# Patient Record
Sex: Female | Born: 1940 | Race: White | Hispanic: No | Smoking: Never smoker
Health system: Southern US, Community
[De-identification: ages and names within clinical notes are randomized; demographics above are authoritative.]

## PROBLEM LIST (undated history)

## (undated) DIAGNOSIS — I2510 Atherosclerotic heart disease of native coronary artery without angina pectoris: Secondary | ICD-10-CM

## (undated) DIAGNOSIS — I255 Ischemic cardiomyopathy: Secondary | ICD-10-CM

## (undated) DIAGNOSIS — T8859XA Other complications of anesthesia, initial encounter: Secondary | ICD-10-CM

## (undated) DIAGNOSIS — I639 Cerebral infarction, unspecified: Secondary | ICD-10-CM

## (undated) DIAGNOSIS — I82409 Acute embolism and thrombosis of unspecified deep veins of unspecified lower extremity: Secondary | ICD-10-CM

## (undated) DIAGNOSIS — E7800 Pure hypercholesterolemia, unspecified: Secondary | ICD-10-CM

## (undated) DIAGNOSIS — T4145XA Adverse effect of unspecified anesthetic, initial encounter: Secondary | ICD-10-CM

## (undated) DIAGNOSIS — I10 Essential (primary) hypertension: Secondary | ICD-10-CM

## (undated) DIAGNOSIS — H409 Unspecified glaucoma: Secondary | ICD-10-CM

## (undated) DIAGNOSIS — R7303 Prediabetes: Secondary | ICD-10-CM

## (undated) DIAGNOSIS — F419 Anxiety disorder, unspecified: Secondary | ICD-10-CM

## (undated) DIAGNOSIS — H9390 Unspecified disorder of ear, unspecified ear: Secondary | ICD-10-CM

## (undated) DIAGNOSIS — Z8489 Family history of other specified conditions: Secondary | ICD-10-CM

## (undated) DIAGNOSIS — H5440 Blindness, one eye, unspecified eye: Secondary | ICD-10-CM

## (undated) HISTORY — PX: BREAST SURGERY: SHX581

## (undated) HISTORY — DX: Acute embolism and thrombosis of unspecified deep veins of unspecified lower extremity: I82.409

## (undated) HISTORY — PX: EYE SURGERY: SHX253

## (undated) HISTORY — DX: Pure hypercholesterolemia, unspecified: E78.00

## (undated) HISTORY — DX: Ischemic cardiomyopathy: I25.5

## (undated) HISTORY — DX: Cerebral infarction, unspecified: I63.9

## (undated) HISTORY — DX: Atherosclerotic heart disease of native coronary artery without angina pectoris: I25.10

## (undated) HISTORY — PX: CHOLECYSTECTOMY: SHX55

---

## 1998-08-14 ENCOUNTER — Emergency Department (HOSPITAL_COMMUNITY): Admission: EM | Admit: 1998-08-14 | Discharge: 1998-08-14 | Payer: Self-pay | Admitting: Emergency Medicine

## 2000-08-21 ENCOUNTER — Encounter: Payer: Self-pay | Admitting: *Deleted

## 2000-08-21 ENCOUNTER — Ambulatory Visit (HOSPITAL_COMMUNITY): Admission: RE | Admit: 2000-08-21 | Discharge: 2000-08-21 | Payer: Self-pay | Admitting: *Deleted

## 2004-08-17 ENCOUNTER — Ambulatory Visit (HOSPITAL_COMMUNITY): Admission: RE | Admit: 2004-08-17 | Discharge: 2004-08-17 | Payer: Self-pay | Admitting: Family Medicine

## 2007-11-14 ENCOUNTER — Emergency Department (HOSPITAL_COMMUNITY): Admission: EM | Admit: 2007-11-14 | Discharge: 2007-11-14 | Payer: Self-pay | Admitting: Emergency Medicine

## 2010-04-17 ENCOUNTER — Encounter: Payer: Self-pay | Admitting: Family Medicine

## 2012-02-28 ENCOUNTER — Other Ambulatory Visit (HOSPITAL_COMMUNITY): Payer: Self-pay | Admitting: Family Medicine

## 2012-02-28 DIAGNOSIS — IMO0001 Reserved for inherently not codable concepts without codable children: Secondary | ICD-10-CM

## 2012-02-28 DIAGNOSIS — M79606 Pain in leg, unspecified: Secondary | ICD-10-CM

## 2012-02-29 ENCOUNTER — Ambulatory Visit (HOSPITAL_COMMUNITY)
Admission: RE | Admit: 2012-02-29 | Discharge: 2012-02-29 | Disposition: A | Discharge status: Home / Self Care | Payer: Medicare Other | Source: Ambulatory Visit | Attending: Family Medicine | Admitting: Family Medicine

## 2012-02-29 DIAGNOSIS — M79609 Pain in unspecified limb: Secondary | ICD-10-CM | POA: Insufficient documentation

## 2012-02-29 DIAGNOSIS — IMO0001 Reserved for inherently not codable concepts without codable children: Secondary | ICD-10-CM | POA: Insufficient documentation

## 2014-03-30 DIAGNOSIS — Z6838 Body mass index (BMI) 38.0-38.9, adult: Secondary | ICD-10-CM | POA: Diagnosis not present

## 2014-03-30 DIAGNOSIS — E782 Mixed hyperlipidemia: Secondary | ICD-10-CM | POA: Diagnosis not present

## 2014-03-30 DIAGNOSIS — I10 Essential (primary) hypertension: Secondary | ICD-10-CM | POA: Diagnosis not present

## 2014-05-26 DIAGNOSIS — H4011X1 Primary open-angle glaucoma, mild stage: Secondary | ICD-10-CM | POA: Diagnosis not present

## 2014-09-21 DIAGNOSIS — B029 Zoster without complications: Secondary | ICD-10-CM | POA: Diagnosis not present

## 2014-09-21 DIAGNOSIS — Z6838 Body mass index (BMI) 38.0-38.9, adult: Secondary | ICD-10-CM | POA: Diagnosis not present

## 2014-09-21 DIAGNOSIS — Z1389 Encounter for screening for other disorder: Secondary | ICD-10-CM | POA: Diagnosis not present

## 2014-09-21 DIAGNOSIS — I10 Essential (primary) hypertension: Secondary | ICD-10-CM | POA: Diagnosis not present

## 2014-11-17 DIAGNOSIS — H4011X1 Primary open-angle glaucoma, mild stage: Secondary | ICD-10-CM | POA: Diagnosis not present

## 2015-03-16 DIAGNOSIS — H401131 Primary open-angle glaucoma, bilateral, mild stage: Secondary | ICD-10-CM | POA: Diagnosis not present

## 2015-07-06 DIAGNOSIS — H179 Unspecified corneal scar and opacity: Secondary | ICD-10-CM | POA: Diagnosis not present

## 2015-07-06 DIAGNOSIS — I10 Essential (primary) hypertension: Secondary | ICD-10-CM | POA: Diagnosis not present

## 2015-07-06 DIAGNOSIS — Z6839 Body mass index (BMI) 39.0-39.9, adult: Secondary | ICD-10-CM | POA: Diagnosis not present

## 2015-07-06 DIAGNOSIS — E669 Obesity, unspecified: Secondary | ICD-10-CM | POA: Diagnosis not present

## 2015-07-06 DIAGNOSIS — K219 Gastro-esophageal reflux disease without esophagitis: Secondary | ICD-10-CM | POA: Diagnosis not present

## 2015-07-06 DIAGNOSIS — Z1389 Encounter for screening for other disorder: Secondary | ICD-10-CM | POA: Diagnosis not present

## 2015-07-06 DIAGNOSIS — E782 Mixed hyperlipidemia: Secondary | ICD-10-CM | POA: Diagnosis not present

## 2015-07-20 DIAGNOSIS — H401131 Primary open-angle glaucoma, bilateral, mild stage: Secondary | ICD-10-CM | POA: Diagnosis not present

## 2015-10-19 DIAGNOSIS — H1712 Central corneal opacity, left eye: Secondary | ICD-10-CM | POA: Diagnosis not present

## 2015-10-19 DIAGNOSIS — Z961 Presence of intraocular lens: Secondary | ICD-10-CM | POA: Diagnosis not present

## 2015-10-19 DIAGNOSIS — H2511 Age-related nuclear cataract, right eye: Secondary | ICD-10-CM | POA: Diagnosis not present

## 2015-10-19 DIAGNOSIS — H401131 Primary open-angle glaucoma, bilateral, mild stage: Secondary | ICD-10-CM | POA: Diagnosis not present

## 2015-10-20 DIAGNOSIS — Z6841 Body Mass Index (BMI) 40.0 and over, adult: Secondary | ICD-10-CM | POA: Diagnosis not present

## 2015-10-20 DIAGNOSIS — Z0000 Encounter for general adult medical examination without abnormal findings: Secondary | ICD-10-CM | POA: Diagnosis not present

## 2015-10-25 DIAGNOSIS — Z0100 Encounter for examination of eyes and vision without abnormal findings: Secondary | ICD-10-CM | POA: Diagnosis not present

## 2016-03-16 DIAGNOSIS — J019 Acute sinusitis, unspecified: Secondary | ICD-10-CM | POA: Diagnosis not present

## 2016-03-16 DIAGNOSIS — E782 Mixed hyperlipidemia: Secondary | ICD-10-CM | POA: Diagnosis not present

## 2016-03-16 DIAGNOSIS — J343 Hypertrophy of nasal turbinates: Secondary | ICD-10-CM | POA: Diagnosis not present

## 2016-03-16 DIAGNOSIS — K219 Gastro-esophageal reflux disease without esophagitis: Secondary | ICD-10-CM | POA: Diagnosis not present

## 2016-03-16 DIAGNOSIS — F419 Anxiety disorder, unspecified: Secondary | ICD-10-CM | POA: Diagnosis not present

## 2016-03-16 DIAGNOSIS — I10 Essential (primary) hypertension: Secondary | ICD-10-CM | POA: Diagnosis not present

## 2016-03-16 DIAGNOSIS — R070 Pain in throat: Secondary | ICD-10-CM | POA: Diagnosis not present

## 2016-03-16 DIAGNOSIS — Z681 Body mass index (BMI) 19 or less, adult: Secondary | ICD-10-CM | POA: Diagnosis not present

## 2016-03-16 DIAGNOSIS — J069 Acute upper respiratory infection, unspecified: Secondary | ICD-10-CM | POA: Diagnosis not present

## 2016-04-07 ENCOUNTER — Emergency Department (HOSPITAL_COMMUNITY)
Admission: EM | Admit: 2016-04-07 | Discharge: 2016-04-08 | Disposition: A | Discharge status: Home / Self Care | Payer: Medicare HMO | Attending: Emergency Medicine | Admitting: Emergency Medicine

## 2016-04-07 ENCOUNTER — Emergency Department (HOSPITAL_COMMUNITY): Payer: Medicare HMO

## 2016-04-07 ENCOUNTER — Encounter (HOSPITAL_COMMUNITY): Payer: Self-pay | Admitting: Emergency Medicine

## 2016-04-07 DIAGNOSIS — Z79899 Other long term (current) drug therapy: Secondary | ICD-10-CM | POA: Diagnosis not present

## 2016-04-07 DIAGNOSIS — F419 Anxiety disorder, unspecified: Secondary | ICD-10-CM | POA: Insufficient documentation

## 2016-04-07 DIAGNOSIS — R42 Dizziness and giddiness: Secondary | ICD-10-CM | POA: Diagnosis not present

## 2016-04-07 DIAGNOSIS — R51 Headache: Secondary | ICD-10-CM | POA: Diagnosis not present

## 2016-04-07 DIAGNOSIS — I10 Essential (primary) hypertension: Secondary | ICD-10-CM | POA: Diagnosis not present

## 2016-04-07 HISTORY — DX: Unspecified glaucoma: H40.9

## 2016-04-07 HISTORY — DX: Essential (primary) hypertension: I10

## 2016-04-07 HISTORY — DX: Unspecified disorder of ear, unspecified ear: H93.90

## 2016-04-07 HISTORY — DX: Anxiety disorder, unspecified: F41.9

## 2016-04-07 MED ORDER — MECLIZINE HCL 12.5 MG PO TABS: 25.0000 mg | ORAL_TABLET | Freq: Once | ORAL | Status: DC

## 2016-04-07 MED ORDER — ONDANSETRON 8 MG PO TBDP
8.0000 mg | ORAL_TABLET | Freq: Once | ORAL | Status: AC
  Administered 2016-04-07: 8 mg via ORAL
  Filled 2016-04-07: qty 1

## 2016-04-07 NOTE — ED Triage Notes (Signed)
Pt states she is "shaky" on the inside. Family states pt has anxiety and pt states she took a a nerve pill prior to coming up here and that she has a headache.

## 2016-04-07 NOTE — ED Provider Notes (Signed)
[facility]EM[facility]Y DEPT Provider Note   CSN: [id number] Arrival date & time: 04/07/16  1951  By signing my name below, I, Talbert NanPaul Grant, attest that this documentation has been prepared under the direction and in the presence of Devoria AlbeIva Magin Balbi, MD. Electronically Signed: Talbert NanPaul Grant, Scribe. 04/07/16. 11:49 PM.  Time seen 23:10 PM   History   Chief Complaint Chief Complaint  Patient presents with  . Anxiety    HPI Sheryl Carter is a 76 y.o. female with h/o ear problems who presents to the Emergency Department complaining of intermittent, moderate headache that radiates from the front of her forehead to her right ear and into the back of the right side of her head and is described as pressure that started at 7pm tonight. She denies that the headache feels like throbbing. She feels like she is falling when walking and when she moves her head. Pt has associated nausea. She has had this falling feeling before, but never to this extent. She took baby aspirin with limited relief twice today at 6:30 pm and 9 pm.  Before Thanksgiving approximately 6 weeks ago, she reports vomiting, nausea, coughing. She also reports that she woke up screaming because she thought she was falling out of her bed. She has had the feeling of falling and movement since that time. Was last at the doctor in the beginning of December, reported to have earwax and was given an abx prescription. She cannot lay flat in bed, she has to lay on her side because it makes her symptoms worse as does standing upright. Pt lives alone. She is a nonsmoker and she does not drink EtOH. She denies neck pain.  She has had vertigo in the past.  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 She has had tubes in her ears in the 1980's.    The history is provided by the patient and a relative. No language interpreter was used.    PCP Dr Sherwood GamblerFusco  Past Medical History:  Diagnosis Date  . Anxiety     . Ear problems   . Glaucoma   . Hypertension     There are no active problems to display for this patient.   Past Surgical History:  Procedure Laterality Date  . CHOLECYSTECTOMY    . EYE SURGERY      OB History    No data available       Home Medications    Prior to Admission medications   Medication Sig Start Date End Date Taking? Authorizing Provider  ALPRAZolam (XANAX) 0.25 MG tablet Take 0.25 mg by mouth 3 (three) times daily.   Yes Historical Provider, MD  aspirin EC 81 MG tablet Take 81 mg by mouth daily.   Yes Historical Provider, MD  esomeprazole (NEXIUM 24HR) 20 MG capsule Take 20 mg by mouth daily at 12 noon.   Yes Historical Provider, MD  lisinopril-hydrochlorothiazide (PRINZIDE,ZESTORETIC) 10-12.5 MG tablet Take 1 tablet by mouth daily. 02/23/16  Yes Historical Provider, MD  timolol (BETIMOL) 0.5 % ophthalmic solution Place 1 drop into both eyes 2 (two) times daily.   Yes Historical Provider, MD  vitamin B-12 (CYANOCOBALAMIN) 1000 MCG tablet Take 1,000 mcg by mouth daily.   Yes Historical Provider, MD  ondansetron (ZOFRAN ODT) 4 MG disintegrating tablet Take 1 tablet (4 mg total) by mouth every 8 (eight) hours as needed for nausea or vomiting. 04/08/16   Devoria AlbeIva Yamilette Garretson, MD  Family History No family history on file.  Social History Social History  Substance Use Topics  . Smoking status: Never Smoker  . Smokeless tobacco: Never Used  . Alcohol use No  lives alone Uses a cane   Allergies   Erythromycin; Other; and Prednisone   Review of Systems Review of Systems  Gastrointestinal: Positive for nausea.  Neurological: Positive for headaches.       She feels like she is falling when she moves her head.   All other systems reviewed and are negative.    Physical Exam Updated Vital Signs BP 154/84 (BP Location: Left Arm)   Pulse 73   Temp 98.5 F (36.9 C) (Oral)   Resp 18   Ht 4\' 11"  (1.499 m)   Wt 190 lb (86.2 kg)   SpO2 99%   BMI 38.38 kg/m    Vital signs normal except for hypertension   Physical Exam  Constitutional: She is oriented to person, place, and time. She appears well-developed and well-nourished.  Non-toxic appearance. She does not appear ill. No distress.  HENT:  Head: Normocephalic and atraumatic.  Right Ear: External ear normal.  Left Ear: External ear normal.  Nose: Nose normal. No mucosal edema or rhinorrhea.  Mouth/Throat: Oropharynx is clear and moist and mucous membranes are normal. No dental abscesses or uvula swelling.  oropharynx edentulous.  Eyes:  Left cornea is opaque. Right pupil is reactive to light.   Neck: Normal range of motion and full passive range of motion without pain. Neck supple.  Cardiovascular: Normal rate, regular rhythm and normal heart sounds.  Exam reveals no gallop and no friction rub.   No murmur heard. Pulmonary/Chest: Effort normal and breath sounds normal. No respiratory distress. She has no wheezes. She has no rhonchi. She has no rales. She exhibits no tenderness and no crepitus.  Abdominal: Soft. Normal appearance and bowel sounds are normal. She exhibits no distension. There is no tenderness. There is no rebound and no guarding.  Musculoskeletal: Normal range of motion. She exhibits no edema or tenderness.  Moves all extremities well.  Neurological: She is alert and oriented to person, place, and time. She has normal strength. No cranial nerve deficit.  Grips equal bilaterally, no pronator drift, no weakness in her extremities  Skin: Skin is warm, dry and intact. No rash noted. No erythema. No pallor.  Psychiatric: She has a normal mood and affect. Her speech is normal and behavior is normal. Her mood appears not anxious.  Nursing note and vitals reviewed.    ED Treatments / Results   DIAGNOSTIC STUDIES: Oxygen Saturation is 99% on room air, normal by my interpretation.      Labs (all labs ordered are listed, but only abnormal results are displayed) Results for  orders placed or performed during the hospital encounter of 04/07/16  Comprehensive metabolic panel  Result Value Ref Range   Sodium 140 135 - 145 mmol/L   Potassium 3.5 3.5 - 5.1 mmol/L   Chloride 104 101 - 111 mmol/L   CO2 28 22 - 32 mmol/L   Glucose, Bld 120 (H) 65 - 99 mg/dL   BUN 11 6 - 20 mg/dL   Creatinine, Ser 1.61 0.44 - 1.00 mg/dL   Calcium 9.4 8.9 - 09.6 mg/dL   Total Protein 6.9 6.5 - 8.1 g/dL   Albumin 3.8 3.5 - 5.0 g/dL   AST 24 15 - 41 U/L   ALT 19 14 - 54 U/L   Alkaline Phosphatase 66 38 -  126 U/L   Total Bilirubin 0.6 0.3 - 1.2 mg/dL   GFR calc non Af Amer >60 >60 mL/min   GFR calc Af Amer >60 >60 mL/min   Anion gap 8 5 - 15  CBC with Differential  Result Value Ref Range   WBC 7.6 4.0 - 10.5 K/uL   RBC 4.58 3.87 - 5.11 MIL/uL   Hemoglobin 14.3 12.0 - 15.0 g/dL   HCT 16.1 09.6 - 04.5 %   MCV 91.0 78.0 - 100.0 fL   MCH 31.2 26.0 - 34.0 pg   MCHC 34.3 30.0 - 36.0 g/dL   RDW 40.9 81.1 - 91.4 %   Platelets 257 150 - 400 K/uL   Neutrophils Relative % 54 %   Neutro Abs 4.1 1.7 - 7.7 K/uL   Lymphocytes Relative 40 %   Lymphs Abs 3.0 0.7 - 4.0 K/uL   Monocytes Relative 4 %   Monocytes Absolute 0.3 0.1 - 1.0 K/uL   Eosinophils Relative 2 %   Eosinophils Absolute 0.1 0.0 - 0.7 K/uL   Basophils Relative 0 %   Basophils Absolute 0.0 0.0 - 0.1 K/uL  Urinalysis, Routine w reflex microscopic  Result Value Ref Range   Color, Urine YELLOW YELLOW   APPearance CLEAR CLEAR   Specific Gravity, Urine 1.006 1.005 - 1.030   pH 6.0 5.0 - 8.0   Glucose, UA NEGATIVE NEGATIVE mg/dL   Hgb urine dipstick NEGATIVE NEGATIVE   Bilirubin Urine NEGATIVE NEGATIVE   Ketones, ur NEGATIVE NEGATIVE mg/dL   Protein, ur NEGATIVE NEGATIVE mg/dL   Nitrite NEGATIVE NEGATIVE   Leukocytes, UA TRACE (A) NEGATIVE   RBC / HPF 0-5 0 - 5 RBC/hpf   WBC, UA 0-5 0 - 5 WBC/hpf   Bacteria, UA MANY (A) NONE SEEN   Laboratory interpretation all normal     EKG  EKG Interpretation None        Radiology Ct Head Wo Contrast  Result Date: 04/08/2016 CLINICAL DATA:  Chronic dizziness. Right-sided headache and posterior head pain. Nausea. Initial encounter. EXAM: CT HEAD WITHOUT CONTRAST TECHNIQUE: Contiguous axial images were obtained from the base of the skull through the vertex without intravenous contrast. COMPARISON:  CT of the head performed 11/14/2007 FINDINGS: Brain: No evidence of acute infarction, hemorrhage, hydrocephalus, extra-axial collection or mass lesion/mass effect. Prominence of the ventricles and sulci reflects mild to moderate cortical volume loss. Mild cerebellar atrophy is noted. Scattered periventricular and subcortical white matter change likely reflects small vessel ischemic microangiopathy. 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 The brainstem and fourth ventricle are within normal limits. The basal ganglia are unremarkable in appearance. The cerebral hemispheres demonstrate grossly normal gray-white differentiation. No mass effect or midline shift is seen. Vascular: No hyperdense vessel or unexpected calcification. Skull: There is no evidence of fracture; visualized osseous structures are unremarkable in appearance. Sinuses/Orbits: The orbits are within normal limits. The paranasal sinuses and mastoid air cells are well-aerated. Other: No significant soft tissue abnormalities are seen. IMPRESSION: 1. No acute intracranial pathology seen on CT. 2. Mild to moderate cortical volume loss and scattered small vessel ischemic microangiopathy. 3. 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 Electronically Signed   By: Roanna Raider M.D.   On: 04/08/2016 01:19    Procedures Procedures (including critical care time)  Medications Ordered in ED Medications  metoCLOPramide (REGLAN) injection 10 mg (10 mg Intramuscular Refused 04/08/16 0118)  ondansetron (ZOFRAN-ODT) disintegrating tablet 8 mg (8 mg Oral Given 04/07/16  2328)      Initial Impression / Assessment and Plan / ED Course  I have reviewed the triage vital signs and the nursing notes.  Pertinent labs & imaging results that were available during my care of the patient were reviewed by me and considered in my medical decision making (see chart for details).  Clinical Course    COORDINATION OF CARE: 11:19 PM Discussed treatment plan with pt at bedside and pt agreed to plan.  I reviewed vertigo and tried to find some medication that would not interview with her glaucoma, but only thing I found was nausea medication.  01:39 AM Dr Sharl Ma, hospitalist, wants me to discuss with neurology  03:12 AM Dr Otelia Limes neurology, has looked at her Ct scan and agrees it is chronic. 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 Does not feel she needs admission for acute stroke evaluation  Pt and daughter given test results and recommendations for treatment. She can see her primary care doctor for further outpatient studies for her stroke. She was referred to Dr Suszanne Conners, ENT. Pt and daughter request referral to psychiatry for anxiety.   Final Clinical Impressions(s) / ED Diagnoses   Final diagnoses:  Vertigo  Anxiety    New Prescriptions New Prescriptions   ONDANSETRON (ZOFRAN ODT) 4 MG DISINTEGRATING TABLET    Take 1 tablet (4 mg total) by mouth every 8 (eight) hours as needed for nausea or vomiting.    Plan discharge  Devoria Albe, MD, FACEP    I personally performed the services described in this documentation, which was scribed in my presence. The recorded information has been reviewed and considered.  Devoria Albe, MD, Concha Pyo, MD 04/08/16 [id number]

## 2016-04-07 NOTE — ED Triage Notes (Signed)
Pt with fullness in head and dizziness.

## 2016-04-08 DIAGNOSIS — R42 Dizziness and giddiness: Secondary | ICD-10-CM | POA: Diagnosis not present

## 2016-04-08 DIAGNOSIS — R51 Headache: Secondary | ICD-10-CM | POA: Diagnosis not present

## 2016-04-08 LAB — URINALYSIS, ROUTINE W REFLEX MICROSCOPIC
Bilirubin Urine: NEGATIVE
GLUCOSE, UA: NEGATIVE mg/dL
HGB URINE DIPSTICK: NEGATIVE
KETONES UR: NEGATIVE mg/dL
NITRITE: NEGATIVE
PH: 6 (ref 5.0–8.0)
PROTEIN: NEGATIVE mg/dL
Specific Gravity, Urine: 1.006 (ref 1.005–1.030)

## 2016-04-08 LAB — COMPREHENSIVE METABOLIC PANEL
ALK PHOS: 66 U/L (ref 38–126)
ALT: 19 U/L (ref 14–54)
AST: 24 U/L (ref 15–41)
Albumin: 3.8 g/dL (ref 3.5–5.0)
Anion gap: 8 (ref 5–15)
BILIRUBIN TOTAL: 0.6 mg/dL (ref 0.3–1.2)
BUN: 11 mg/dL (ref 6–20)
CALCIUM: 9.4 mg/dL (ref 8.9–10.3)
CO2: 28 mmol/L (ref 22–32)
Chloride: 104 mmol/L (ref 101–111)
Creatinine, Ser: 0.73 mg/dL (ref 0.44–1.00)
Glucose, Bld: 120 mg/dL — ABNORMAL HIGH (ref 65–99)
Potassium: 3.5 mmol/L (ref 3.5–5.1)
SODIUM: 140 mmol/L (ref 135–145)
TOTAL PROTEIN: 6.9 g/dL (ref 6.5–8.1)

## 2016-04-08 LAB — CBC WITH DIFFERENTIAL/PLATELET
Basophils Absolute: 0 K/uL (ref 0.0–0.1)
Basophils Relative: 0 %
Eosinophils Absolute: 0.1 K/uL (ref 0.0–0.7)
Eosinophils Relative: 2 %
HCT: 41.7 % (ref 36.0–46.0)
Hemoglobin: 14.3 g/dL (ref 12.0–15.0)
Lymphocytes Relative: 40 %
Lymphs Abs: 3 K/uL (ref 0.7–4.0)
MCH: 31.2 pg (ref 26.0–34.0)
MCHC: 34.3 g/dL (ref 30.0–36.0)
MCV: 91 fL (ref 78.0–100.0)
Monocytes Absolute: 0.3 K/uL (ref 0.1–1.0)
Monocytes Relative: 4 %
Neutro Abs: 4.1 K/uL (ref 1.7–7.7)
Neutrophils Relative %: 54 %
Platelets: 257 K/uL (ref 150–400)
RBC: 4.58 MIL/uL (ref 3.87–5.11)
RDW: 12.6 % (ref 11.5–15.5)
WBC: 7.6 K/uL (ref 4.0–10.5)

## 2016-04-08 MED ORDER — METOCLOPRAMIDE HCL 5 MG/ML IJ SOLN
10.0000 mg | Freq: Once | INTRAMUSCULAR | Status: DC
  Filled 2016-04-08: qty 2

## 2016-04-08 MED ORDER — ONDANSETRON 4 MG PO TBDP: 4.0000 mg | ORAL_TABLET | Freq: Three times a day (TID) | ORAL | 0 refills | Status: DC | PRN

## 2016-04-08 NOTE — ED Notes (Signed)
Pt wheeled to car. Pt verbalized understanding of discharge instructions.  

## 2016-04-08 NOTE — Discharge Instructions (Signed)
Call Dr Avel Sensoreoh's office to get an appointment. He is an Ears, Nose and Throat Specialist. He can do Epley's maneuvers on you to help localize the area in your inner ear causing the dizziness and help treat it. DO NOT DRIVE UNTIL YOUR DIZZINESS IS GONE!! Use the zofran for nausea or vomiting.  Follow up with Dr Sherwood GamblerFusco to get further outpatient testing because she have had a stroke since your CT scan in 2009. Look at the outpatient psychiatric referral information to get an appointment with a psychiatrist to evaluate and treat your anxiety. Recheck as needed.

## 2016-04-08 NOTE — ED Notes (Signed)
Pt did not ant the Im injection at this time and wanted to wait until the CT results come back. Pt stated that her pain her went down some and feels more anxious.

## 2016-04-11 DIAGNOSIS — R7309 Other abnormal glucose: Secondary | ICD-10-CM | POA: Diagnosis not present

## 2016-04-11 DIAGNOSIS — Z1389 Encounter for screening for other disorder: Secondary | ICD-10-CM | POA: Diagnosis not present

## 2016-04-11 DIAGNOSIS — Z8673 Personal history of transient ischemic attack (TIA), and cerebral infarction without residual deficits: Secondary | ICD-10-CM | POA: Diagnosis not present

## 2016-04-11 DIAGNOSIS — Z6839 Body mass index (BMI) 39.0-39.9, adult: Secondary | ICD-10-CM | POA: Diagnosis not present

## 2016-04-11 DIAGNOSIS — E782 Mixed hyperlipidemia: Secondary | ICD-10-CM | POA: Diagnosis not present

## 2016-04-11 DIAGNOSIS — I10 Essential (primary) hypertension: Secondary | ICD-10-CM | POA: Diagnosis not present

## 2016-04-18 ENCOUNTER — Emergency Department (HOSPITAL_COMMUNITY): Payer: Medicare HMO

## 2016-04-18 ENCOUNTER — Observation Stay (HOSPITAL_COMMUNITY)
Admission: EM | Admit: 2016-04-18 | Discharge: 2016-04-20 | Disposition: A | Discharge status: Home / Self Care | Payer: Medicare HMO | Attending: Internal Medicine | Admitting: Internal Medicine

## 2016-04-18 ENCOUNTER — Encounter (HOSPITAL_COMMUNITY): Payer: Self-pay | Admitting: Emergency Medicine

## 2016-04-18 DIAGNOSIS — R4781 Slurred speech: Secondary | ICD-10-CM | POA: Diagnosis not present

## 2016-04-18 DIAGNOSIS — Z79899 Other long term (current) drug therapy: Secondary | ICD-10-CM | POA: Diagnosis not present

## 2016-04-18 DIAGNOSIS — E784 Other hyperlipidemia: Secondary | ICD-10-CM

## 2016-04-18 DIAGNOSIS — F419 Anxiety disorder, unspecified: Secondary | ICD-10-CM | POA: Diagnosis present

## 2016-04-18 DIAGNOSIS — I10 Essential (primary) hypertension: Secondary | ICD-10-CM | POA: Insufficient documentation

## 2016-04-18 DIAGNOSIS — R42 Dizziness and giddiness: Secondary | ICD-10-CM | POA: Insufficient documentation

## 2016-04-18 DIAGNOSIS — Z5181 Encounter for therapeutic drug level monitoring: Secondary | ICD-10-CM | POA: Diagnosis not present

## 2016-04-18 DIAGNOSIS — R51 Headache: Secondary | ICD-10-CM | POA: Diagnosis not present

## 2016-04-18 DIAGNOSIS — I659 Occlusion and stenosis of unspecified precerebral artery: Secondary | ICD-10-CM | POA: Diagnosis not present

## 2016-04-18 DIAGNOSIS — R2689 Other abnormalities of gait and mobility: Secondary | ICD-10-CM

## 2016-04-18 DIAGNOSIS — G459 Transient cerebral ischemic attack, unspecified: Secondary | ICD-10-CM

## 2016-04-18 DIAGNOSIS — E785 Hyperlipidemia, unspecified: Secondary | ICD-10-CM | POA: Diagnosis present

## 2016-04-18 DIAGNOSIS — I6522 Occlusion and stenosis of left carotid artery: Secondary | ICD-10-CM | POA: Diagnosis not present

## 2016-04-18 DIAGNOSIS — R531 Weakness: Secondary | ICD-10-CM | POA: Diagnosis present

## 2016-04-18 LAB — CBC
HCT: 42.9 % (ref 36.0–46.0)
Hemoglobin: 15.1 g/dL — ABNORMAL HIGH (ref 12.0–15.0)
MCH: 31.6 pg (ref 26.0–34.0)
MCHC: 35.2 g/dL (ref 30.0–36.0)
MCV: 89.7 fL (ref 78.0–100.0)
PLATELETS: 273 10*3/uL (ref 150–400)
RBC: 4.78 MIL/uL (ref 3.87–5.11)
RDW: 12.4 % (ref 11.5–15.5)
WBC: 10 10*3/uL (ref 4.0–10.5)

## 2016-04-18 LAB — URINALYSIS, ROUTINE W REFLEX MICROSCOPIC
Bilirubin Urine: NEGATIVE
GLUCOSE, UA: NEGATIVE mg/dL
HGB URINE DIPSTICK: NEGATIVE
Ketones, ur: NEGATIVE mg/dL
Nitrite: NEGATIVE
PROTEIN: NEGATIVE mg/dL
SPECIFIC GRAVITY, URINE: 1.005 (ref 1.005–1.030)
pH: 6 (ref 5.0–8.0)

## 2016-04-18 LAB — I-STAT CHEM 8, ED
BUN: 18 mg/dL (ref 6–20)
CHLORIDE: 102 mmol/L (ref 101–111)
Calcium, Ion: 1.23 mmol/L (ref 1.15–1.40)
Creatinine, Ser: 0.9 mg/dL (ref 0.44–1.00)
Glucose, Bld: 108 mg/dL — ABNORMAL HIGH (ref 65–99)
HEMATOCRIT: 45 % (ref 36.0–46.0)
Hemoglobin: 15.3 g/dL — ABNORMAL HIGH (ref 12.0–15.0)
Potassium: 3.5 mmol/L (ref 3.5–5.1)
Sodium: 138 mmol/L (ref 135–145)
TCO2: 28 mmol/L (ref 0–100)

## 2016-04-18 LAB — RAPID URINE DRUG SCREEN, HOSP PERFORMED
Amphetamines: NOT DETECTED
BARBITURATES: NOT DETECTED
BENZODIAZEPINES: NOT DETECTED
COCAINE: NOT DETECTED
OPIATES: NOT DETECTED
TETRAHYDROCANNABINOL: NOT DETECTED

## 2016-04-18 LAB — COMPREHENSIVE METABOLIC PANEL
ALBUMIN: 4.1 g/dL (ref 3.5–5.0)
ALT: 16 U/L (ref 14–54)
ANION GAP: 9 (ref 5–15)
AST: 25 U/L (ref 15–41)
Alkaline Phosphatase: 66 U/L (ref 38–126)
BUN: 17 mg/dL (ref 6–20)
CHLORIDE: 98 mmol/L — AB (ref 101–111)
CO2: 28 mmol/L (ref 22–32)
Calcium: 9.7 mg/dL (ref 8.9–10.3)
Creatinine, Ser: 0.85 mg/dL (ref 0.44–1.00)
GFR calc Af Amer: >60 mL/min (ref >60)
GFR calc non Af Amer: >60 mL/min (ref >60)
GLUCOSE: 109 mg/dL — AB (ref 65–99)
POTASSIUM: 3.3 mmol/L — AB (ref 3.5–5.1)
SODIUM: 135 mmol/L (ref 135–145)
Total Bilirubin: 0.9 mg/dL (ref 0.3–1.2)
Total Protein: 7.5 g/dL (ref 6.5–8.1)

## 2016-04-18 LAB — CBG MONITORING, ED: GLUCOSE-CAPILLARY: 105 mg/dL — AB (ref 65–99)

## 2016-04-18 LAB — DIFFERENTIAL
BASOS PCT: 0 %
Basophils Absolute: 0 10*3/uL (ref 0.0–0.1)
Eosinophils Absolute: 0.1 10*3/uL (ref 0.0–0.7)
Eosinophils Relative: 1 %
LYMPHS PCT: 37 %
Lymphs Abs: 3.7 10*3/uL (ref 0.7–4.0)
Monocytes Absolute: 0.5 10*3/uL (ref 0.1–1.0)
Monocytes Relative: 5 %
NEUTROS PCT: 57 %
Neutro Abs: 5.6 10*3/uL (ref 1.7–7.7)

## 2016-04-18 LAB — I-STAT TROPONIN, ED: Troponin i, poc: 0.01 ng/mL (ref 0.00–0.08)

## 2016-04-18 LAB — PROTIME-INR
INR: 0.89
PROTHROMBIN TIME: 12 s (ref 11.4–15.2)

## 2016-04-18 LAB — APTT: aPTT: 32 seconds (ref 24–36)

## 2016-04-18 LAB — ETHANOL

## 2016-04-18 MED ORDER — PANTOPRAZOLE SODIUM 40 MG PO TBEC
40.0000 mg | DELAYED_RELEASE_TABLET | Freq: Every day | ORAL | Status: DC
  Administered 2016-04-18 – 2016-04-19 (×2): 40 mg via ORAL
  Filled 2016-04-18 (×2): qty 1

## 2016-04-18 MED ORDER — ACETAMINOPHEN 650 MG RE SUPP
650.0000 mg | Freq: Four times a day (QID) | RECTAL | Status: DC | PRN
Start: 2016-04-18 — End: 2016-04-20

## 2016-04-18 MED ORDER — ONDANSETRON 4 MG PO TBDP: 4.0000 mg | ORAL_TABLET | Freq: Three times a day (TID) | ORAL | Status: DC | PRN

## 2016-04-18 MED ORDER — VITAMIN B-12 1000 MCG PO TABS
1000.0000 ug | ORAL_TABLET | Freq: Every day | ORAL | Status: DC
  Administered 2016-04-18 – 2016-04-19 (×2): 1000 ug via ORAL
  Filled 2016-04-18 (×2): qty 1

## 2016-04-18 MED ORDER — SODIUM CHLORIDE 0.9 % IV SOLN: 250.0000 mL | INTRAVENOUS | Status: DC | PRN

## 2016-04-18 MED ORDER — ZOLPIDEM TARTRATE 5 MG PO TABS: 5.0000 mg | ORAL_TABLET | Freq: Every evening | ORAL | Status: DC | PRN

## 2016-04-18 MED ORDER — LISINOPRIL 10 MG PO TABS
10.0000 mg | ORAL_TABLET | Freq: Every day | ORAL | Status: DC
  Administered 2016-04-19: 10 mg via ORAL
  Filled 2016-04-18: qty 1

## 2016-04-18 MED ORDER — LISINOPRIL-HYDROCHLOROTHIAZIDE 10-12.5 MG PO TABS: 1.0000 | ORAL_TABLET | Freq: Every day | ORAL | Status: DC

## 2016-04-18 MED ORDER — TIMOLOL MALEATE 0.5 % OP SOLN
1.0000 [drp] | Freq: Two times a day (BID) | OPHTHALMIC | Status: DC
  Administered 2016-04-18 – 2016-04-20 (×4): 1 [drp] via OPHTHALMIC
  Filled 2016-04-18: qty 5

## 2016-04-18 MED ORDER — SODIUM CHLORIDE 0.9% FLUSH: 3.0000 mL | INTRAVENOUS | Status: DC | PRN

## 2016-04-18 MED ORDER — ENOXAPARIN SODIUM 40 MG/0.4ML ~~LOC~~ SOLN
40.0000 mg | SUBCUTANEOUS | Status: DC
  Administered 2016-04-18 – 2016-04-19 (×2): 40 mg via SUBCUTANEOUS
  Filled 2016-04-18 (×2): qty 0.4

## 2016-04-18 MED ORDER — ASPIRIN EC 81 MG PO TBEC
81.0000 mg | DELAYED_RELEASE_TABLET | Freq: Every day | ORAL | Status: DC
  Administered 2016-04-19 – 2016-04-20 (×2): 81 mg via ORAL
  Filled 2016-04-18 (×2): qty 1

## 2016-04-18 MED ORDER — ALPRAZOLAM 0.25 MG PO TABS
0.2500 mg | ORAL_TABLET | Freq: Three times a day (TID) | ORAL | Status: DC | PRN
Start: 2016-04-18 — End: 2016-04-20
  Administered 2016-04-18 – 2016-04-20 (×4): 0.25 mg via ORAL
  Filled 2016-04-18 (×4): qty 1

## 2016-04-18 MED ORDER — PRAVASTATIN SODIUM 10 MG PO TABS
20.0000 mg | ORAL_TABLET | Freq: Every evening | ORAL | Status: DC
  Administered 2016-04-18: 20 mg via ORAL
  Filled 2016-04-18: qty 2

## 2016-04-18 MED ORDER — HYDROCHLOROTHIAZIDE 12.5 MG PO CAPS: 12.5000 mg | ORAL_CAPSULE | Freq: Every day | ORAL | Status: DC

## 2016-04-18 MED ORDER — ACETAMINOPHEN 325 MG PO TABS: 650.0000 mg | ORAL_TABLET | Freq: Four times a day (QID) | ORAL | Status: DC | PRN

## 2016-04-18 NOTE — ED Provider Notes (Signed)
[facility]-EMER[facility]ENCY DEPT Provider Note   CSN: [id number] Arrival date & time: 04/18/16  1451   An emergency department physician performed an initial assessment on this suspected stroke patient at 1510.  History   Chief Complaint Chief Complaint  Patient presents with  . Weakness    HPI Sheryl Carter is a 76 y.o. female.  HPI  Pt was seen at 1500. Per pt and her family, c/o sudden onset and persistence of constant "slurred speech" that began approximately 1.5 hours PTA. Pt also states her "vision is blurry." Describes the slurred speech as "trouble getting words out." Has been associated with posterior head "pain" and "dizziness" which has been intermittent for the past several weeks. Denies CP/palpitations, no SOB/cough, no abd pain, no N/V/D, no fevers, no visual changes, no focal motor weakness, no tingling/numbness in extremities, no ataxia, no facial droop.    Past Medical History:  Diagnosis Date  . Anxiety   . Ear problems   . Glaucoma   . Hypertension     There are no active problems to display for this patient.   Past Surgical History:  Procedure Laterality Date  . CHOLECYSTECTOMY    . EYE SURGERY      OB History    No data available       Home Medications    Prior to Admission medications   Medication Sig Start Date End Date Taking? Authorizing Provider  ALPRAZolam (XANAX) 0.25 MG tablet Take 0.25 mg by mouth 3 (three) times daily.   Yes Historical Provider, MD  aspirin EC 81 MG tablet Take 81 mg by mouth daily.   Yes Historical Provider, MD  esomeprazole (NEXIUM 24HR) 20 MG capsule Take 20 mg by mouth daily at 12 noon.   Yes Historical Provider, MD  lisinopril-hydrochlorothiazide (PRINZIDE,ZESTORETIC) 10-12.5 MG tablet Take 1 tablet by mouth daily. 02/23/16  Yes Historical Provider, MD  pravastatin (PRAVACHOL) 20 MG tablet Take 20 mg by mouth every evening.   Yes Historical Provider, MD  timolol (BETIMOL) 0.5 % ophthalmic solution Place 1 drop into  both eyes 2 (two) times daily.   Yes Historical Provider, MD  vitamin B-12 (CYANOCOBALAMIN) 1000 MCG tablet Take 1,000 mcg by mouth daily.   Yes Historical Provider, MD  ondansetron (ZOFRAN ODT) 4 MG disintegrating tablet Take 1 tablet (4 mg total) by mouth every 8 (eight) hours as needed for nausea or vomiting. 04/08/16   Devoria Albe, MD    Family History No family history on file.  Social History Social History  Substance Use Topics  . Smoking status: Never Smoker  . Smokeless tobacco: Never Used  . Alcohol use No     Allergies   Erythromycin; Other; and Prednisone   Review of Systems Review of Systems ROS: Statement: All systems negative except as marked or noted in the HPI; Constitutional: Negative for fever and chills. ; ; Eyes: Negative for eye pain, redness and discharge. +visual changes.; ; ENMT: Negative for ear pain, hoarseness, nasal congestion, sinus pressure and sore throat. ; ; Cardiovascular: Negative for chest pain, palpitations, diaphoresis, dyspnea and peripheral edema. ; ; Respiratory: Negative for cough, wheezing and stridor. ; ; Gastrointestinal: Negative for nausea, vomiting, diarrhea, abdominal pain, blood in stool, hematemesis, jaundice and rectal bleeding. . ; ; Genitourinary: Negative for dysuria, flank pain and hematuria. ; ; Musculoskeletal: Negative for back pain and neck pain. Negative for swelling and trauma.; ; Skin: Negative for pruritus, rash, abrasions, blisters, bruising and skin lesion.; ; Neuro: +headache, slurred speech.  Negative for lightheadedness and neck stiffness. Negative for weakness, altered level of consciousness, altered mental status, extremity weakness, paresthesias, involuntary movement, seizure and syncope.       Physical Exam Updated Vital Signs BP 138/77 (BP Location: Left Arm)   Pulse 77   Temp 98.4 F (36.9 C)   Resp 18   Ht 4\' 11"  (1.499 m)   Wt 190 lb (86.2 kg)   SpO2 96%   BMI 38.38 kg/m   Physical Exam 1505: Physical  examination:  Nursing notes reviewed; Vital signs and O2 SAT reviewed;  Constitutional: Well developed, Well nourished, Well hydrated, In no acute distress; Head:  Normocephalic, atraumatic; Eyes: EOMI, PERRL, No scleral icterus; ENMT: Mouth and pharynx normal, Mucous membranes moist; Neck: Supple, Full range of motion, No lymphadenopathy; Cardiovascular: Regular rate and rhythm, No gallop; Respiratory: Breath sounds clear & equal bilaterally, No wheezes.  Speaking full sentences with ease, Normal respiratory effort/excursion; Chest: Nontender, Movement normal; Abdomen: Soft, Nontender, Nondistended, Normal bowel sounds; Genitourinary: No CVA tenderness; .Spine:  No midline CS, TS, LS tenderness.;; Extremities: Pulses normal, No tenderness, No edema, No calf edema or asymmetry.; Neuro: AA&Ox3, +blind left eye per hx. Otherwise major CN grossly intact. Speech clear.  No facial droop.  No nystagmus. Grips equal. Strength 5/5 equal bilat UE's and LE's.  DTR 2/4 equal bilat UE's and LE's.  No gross sensory deficits.  Normal cerebellar testing bilat UE's (finger-nose) and LE's (heel-shin)..; Skin: Color normal, Warm, Dry.   ED Treatments / Results  Labs (all labs ordered are listed, but only abnormal results are displayed)   EKG  EKG Interpretation  Date/Time:  Tuesday April 18 2016 15:19:29 EST Ventricular Rate:  78 PR Interval:    QRS Duration: 93 QT Interval:  361 QTC Calculation: 412 R Axis:   35 Text Interpretation:  Sinus rhythm Nonspecific repol abnormality, diffuse leads When compared with ECG of 11/14/2007 Nonspecific ST and T wave abnormality is more pronounced Confirmed by [facility]  MD, Nicholos Johns [phone]) on 04/18/2016 4:23:37 PM       Radiology   Procedures Procedures (including critical care time)  Medications Ordered in ED Medications - No data to display   Initial Impression / Assessment and Plan / ED Course  I have reviewed the triage vital signs and the nursing  notes.  Pertinent labs & imaging results that were available during my care of the patient were reviewed by me and considered in my medical decision making (see chart for details).  MDM Reviewed: nursing note, previous chart and vitals Reviewed previous: labs and ECG Interpretation: labs, ECG, MRI and CT scan Total time providing critical care: 30-74 minutes. This excludes time spent performing separately reportable procedures and services. Consults: neurology and admitting MD   CRITICAL CARE Performed by: Laray Anger Total critical care time: 35 minutes Critical care time was exclusive of separately billable procedures and treating other patients. Critical care was necessary to treat or prevent imminent or life-threatening deterioration. Critical care was time spent personally by me on the following activities: development of treatment plan with patient and/or surrogate as well as nursing, discussions with consultants, evaluation of patient's response to treatment, examination of patient, obtaining history from patient or surrogate, ordering and performing treatments and interventions, ordering and review of laboratory studies, ordering and review of radiographic studies, pulse oximetry and re-evaluation of patient's condition.  Results for orders placed or performed during the hospital encounter of 04/18/16  Ethanol  Result Value Ref Range   Alcohol, Ethyl (  B) <5 <5 mg/dL  Protime-INR  Result Value Ref Range   Prothrombin Time 12.0 11.4 - 15.2 seconds   INR 0.89   APTT  Result Value Ref Range   aPTT 32 24 - 36 seconds  CBC  Result Value Ref Range   WBC 10.0 4.0 - 10.5 K/uL   RBC 4.78 3.87 - 5.11 MIL/uL   Hemoglobin 15.1 (H) 12.0 - 15.0 g/dL   HCT 40.942.9 81.136.0 - 91.446.0 %   MCV 89.7 78.0 - 100.0 fL   MCH 31.6 26.0 - 34.0 pg   MCHC 35.2 30.0 - 36.0 g/dL   RDW 78.212.4 95.611.5 - 21.315.5 %   Platelets 273 150 - 400 K/uL  Differential  Result Value Ref Range   Neutrophils Relative % 57  %   Neutro Abs 5.6 1.7 - 7.7 K/uL   Lymphocytes Relative 37 %   Lymphs Abs 3.7 0.7 - 4.0 K/uL   Monocytes Relative 5 %   Monocytes Absolute 0.5 0.1 - 1.0 K/uL   Eosinophils Relative 1 %   Eosinophils Absolute 0.1 0.0 - 0.7 K/uL   Basophils Relative 0 %   Basophils Absolute 0.0 0.0 - 0.1 K/uL  Comprehensive metabolic panel  Result Value Ref Range   Sodium 135 135 - 145 mmol/L   Potassium 3.3 (L) 3.5 - 5.1 mmol/L   Chloride 98 (L) 101 - 111 mmol/L   CO2 28 22 - 32 mmol/L   Glucose, Bld 109 (H) 65 - 99 mg/dL   BUN 17 6 - 20 mg/dL   Creatinine, Ser 0.860.85 0.44 - 1.00 mg/dL   Calcium 9.7 8.9 - 57.810.3 mg/dL   Total Protein 7.5 6.5 - 8.1 g/dL   Albumin 4.1 3.5 - 5.0 g/dL   AST 25 15 - 41 U/L   ALT 16 14 - 54 U/L   Alkaline Phosphatase 66 38 - 126 U/L   Total Bilirubin 0.9 0.3 - 1.2 mg/dL   GFR calc non Af Amer >60 >60 mL/min   GFR calc Af Amer >60 >60 mL/min   Anion gap 9 5 - 15  CBG monitoring, ED  Result Value Ref Range   Glucose-Capillary 105 (H) 65 - 99 mg/dL  I-Stat Chem 8, ED  ([facility])  Result Value Ref Range   Sodium 138 135 - 145 mmol/L   Potassium 3.5 3.5 - 5.1 mmol/L   Chloride 102 101 - 111 mmol/L   BUN 18 6 - 20 mg/dL   Creatinine, Ser 4.690.90 0.44 - 1.00 mg/dL   Glucose, Bld 629108 (H) 65 - 99 mg/dL   Calcium, Ion 5.281.23 4.131.15 - 1.40 mmol/L   TCO2 28 0 - 100 mmol/L   Hemoglobin 15.3 (H) 12.0 - 15.0 g/dL   HCT 24.445.0 01.036.0 - 27.246.0 %  I-stat troponin, ED (not at [facility], [facility])  Result Value Ref Range   Troponin i, poc 0.01 0.00 - 0.08 ng/mL   Comment 3           [name] (neuro Protocol) Result Date: 04/18/2016 CLINICAL DATA:  Slurred speech. EXAM: MRI HEAD WITHOUT CONTRAST TECHNIQUE: Multiplanar, multiecho pulse sequences of the brain and surrounding structures were obtained without intravenous contrast. COMPARISON:  Head CT 04/18/2016 FINDINGS: Brain: There is no evidence of acute infarct, intracranial hemorrhage, mass, midline shift, or extra-axial fluid  collection. Mild-to-moderate enlargement of the lateral and third ventricles and sylvian fissures favored to reflect cerebral atrophy. There is a small chronic infarct in the posterior right  cerebellar hemisphere. Patchy T2 hyperintensities in the subcortical and deep cerebral white matter bilaterally are nonspecific but compatible with mild-to-moderate chronic small vessel ischemic disease. Vascular: Major intracranial vascular flow voids are preserved, with the left vertebral artery dominant. Skull and upper cervical spine: Unremarkable bone marrow signal. Sinuses/Orbits: Prior left cataract extraction. Small mastoid effusions. Clear paranasal sinuses. Other: None. IMPRESSION: 1. No acute intracranial abnormality. 2. Chronic right cerebellar infarct. 3. Mild-to-moderate chronic small vessel ischemic disease and cerebral atrophy. Electronically Signed   By: Sebastian Ache M.D.   On: 04/18/2016 16:50   Ct Head Code Stroke W/o Cm Result Date: 04/18/2016 CLINICAL DATA:  Code stroke.  Slurred speech EXAM: CT HEAD WITHOUT CONTRAST TECHNIQUE: Contiguous axial images were obtained from the base of the skull through the vertex without intravenous contrast. COMPARISON:  04/08/2016 FINDINGS: Brain: No evidence of acute infarction, hemorrhage, hydrocephalus, extra-axial collection or mass lesion/mass effect. Lateral and third ventriculomegaly favored secondary to volume loss given lack of temporal horn dilatation, stable from prior. Chronic microvascular disease with ischemic gliosis in the periventricular white matter. Remote small vessel infarct in the peripheral right cerebellum. Vascular: Atherosclerotic calcification.  No hyperdense vessel. Skull: No acute or aggressive finding. Sinuses/Orbits: No acute finding.  Cataract resection on the left. Other: These results were called by telephone at the time of interpretation on 04/18/2016 at 3:35 pm to Dr. Samuel Jester , who verbally acknowledged these results. ASPECTS  [facility] Stroke Program Early CT Score) - Ganglionic level infarction (caudate, lentiform nuclei, internal capsule, insula, M1-M3 cortex): 7 - Supraganglionic infarction (M4-M6 cortex): 3 Total score (0-10 with 10 being normal): 10 IMPRESSION: 1. No acute finding.ASPECTS is 10. 2. Chronic microvascular disease. Small remote vessel infarct in the right cerebellum. Electronically Signed   By: Marnee Spring M.D.   On: 04/18/2016 15:37    1550:  Code Stroke called. SOC Neuro has evaluated pt: not TPA candidate, deficits are resolved, admit for stroke workup.   1725:  Workup reassuring. Dx and testing d/w pt and family.  Questions answered.  Verb understanding, agreeable to admit.   T/C to [facility] Dr. Arlean Hopping, case discussed, including:  HPI, pertinent PM/SHx, VS/PE, dx testing, ED course and treatment:  Agreeable to admit, requests to write temporary orders, obtain tele bed to team APAdmits.   Final Clinical Impressions(s) / ED Diagnoses   Final diagnoses:  Slurred speech    New Prescriptions New Prescriptions   No medications on file     Samuel Jester, DO 04/21/16 1551

## 2016-04-18 NOTE — ED Notes (Signed)
Back from MRI.

## 2016-04-18 NOTE — ED Triage Notes (Signed)
Pt presented with stroke like symptoms about 1/12. Patient states slurred speech that started today at aprox 1400. Pt states not feeling right and having blurred vision 15 minutes ago.

## 2016-04-18 NOTE — H&P (Signed)
[facility] History and Physical  Sheryl ChanceShirley M Huge ZOX:[id number] DOB: 09/16/1940 DOA: 04/18/2016  Referring physician: Dr. Clarene DukeMcManus PCP: Cassell SmilesFUSCO,LAWRENCE J., MD   Chief Complaint: Slurred speech, dizziness  HPI: Sheryl Carter is a 76 y.o. female with hx of HTN, glaucoma, and anxiety presented with slurred speech and blurred vision starting today at approx 2pm.  Came to ED. Head CT here showed no acute changes. 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 Asked to see for poss CVA/ TIA.    Daughter provides most history . Patient with long hx of mental illness/ anxiety/ nervous breakdown.  She was admitted to psych facility [facility] in the 1980's.  Divorced age 76, never remarried. Both of her parents committed suicide.  Per the daughter she has periods of severe "anxiety" or "nervous breakdown", particularly surrounding medical issues or doctors/ hospitals.  These attacks involved slurred speech going back decades.  Also one of her brothers died 1 year ago.   Patient lives alone, has obsession with cleaning per dtr.  Hasn't driven for several years.  Her brother brought her here today due to pt's complaints of "not feeling right".  Takes Xanax at home and BP and cholesterol medications. 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    She had a discussion w the daughter this week about getting medical POA papers signes.  She also has told her daughter that she doesn't want any heroic or life-prolonging measures at all and was very serious about this ( no feeding tubes, CPR, etc).       ROS  denies CP  no joint pain   no HA  no blurry vision  no rash  no diarrhea  no nausea/ vomiting  no dysuria  no difficulty voiding  no change in urine color    Past Medical History  Past Medical History:  Diagnosis Date  . Anxiety   . Ear problems   . Glaucoma   . Hypertension    Past Surgical History  Past Surgical History:  Procedure  Laterality Date  . CHOLECYSTECTOMY    . EYE SURGERY     Family History No family history on file. Social History  reports that she has never smoked. She has never used smokeless tobacco. She reports that she does not drink alcohol or use drugs. Allergies  Allergies  Allergen Reactions  . Erythromycin     Reaction is unknown  . Other Hives    Mycins-   . Prednisone     Altered mental status   Home medications Prior to Admission medications   Medication Sig Start Date End Date Taking? Authorizing Provider  ALPRAZolam (XANAX) 0.25 MG tablet Take 0.25 mg by mouth 3 (three) times daily.   Yes Historical Provider, MD  aspirin EC 81 MG tablet Take 81 mg by mouth daily.   Yes Historical Provider, MD  esomeprazole (NEXIUM 24HR) 20 MG capsule Take 20 mg by mouth daily at 12 noon.   Yes Historical Provider, MD  lisinopril-hydrochlorothiazide (PRINZIDE,ZESTORETIC) 10-12.5 MG tablet Take 1 tablet by mouth daily. 02/23/16  Yes Historical Provider, MD  pravastatin (PRAVACHOL) 20 MG tablet Take 20 mg by mouth every evening.   Yes Historical Provider, MD  timolol (BETIMOL) 0.5 % ophthalmic solution Place 1 drop into both eyes 2 (two) times daily.   Yes Historical Provider, MD  vitamin B-12 (CYANOCOBALAMIN) 1000 MCG tablet Take 1,000 mcg by mouth daily.   Yes Historical Provider, MD  ondansetron (ZOFRAN ODT) 4 MG disintegrating tablet Take 1 tablet (4 mg total) by mouth every 8 (eight) hours as needed for nausea or vomiting. 04/08/16   Devoria AlbeIva Knapp, MD   Liver Function Tests  Recent Labs Lab 04/18/16 1511  AST 25  ALT 16  ALKPHOS 66  BILITOT 0.9  PROT 7.5  ALBUMIN 4.1   No results for input(s): LIPASE, AMYLASE in the last 168 hours. CBC  Recent Labs Lab 04/18/16 1511 04/18/16 1526  WBC 10.0  --   NEUTROABS 5.6  --   HGB 15.1* 15.3*  HCT 42.9 45.0  MCV 89.7  --   PLT 273  --    Basic Metabolic Panel  Recent Labs Lab 04/18/16 1511 04/18/16 1526  NA 135 138  K 3.3* 3.5  CL 98*  102  CO2 28  --   GLUCOSE 109* 108*  BUN 17 18  CREATININE 0.85 0.90  CALCIUM 9.7  --      Vitals:   04/18/16 1530 04/18/16 1747 04/18/16 1834 04/18/16 1836  BP:   152/82   Pulse: 77   67  Resp: 18   17  Temp:  98.4 F (36.9 C)    TempSrc:      SpO2: 96%   97%  Weight:      Height:       Exam: Gen alert, pleasant, no distress No rash, cyanosis or gangrene L cornea opaque Sclera anicteric, throat clear  No jvd or bruit Chest clear bilat RRR no MRG Abd soft ntnd no mass or ascites +bs GU defer MS no joint effusions or deformity Ext no LE edema / no wounds or ulcers Neuro - nonfocal UE's and LE's symmetric 4+/5 VF's intact R eye Ox 3 Judgement and attention wnl  Assessment: 1. Slurred speech - nonfocal on exam.  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  Other w/u neg so far, plan admit for CVA w/u.  If negative then this is likely due to known chronic anxiety (see above).  May need OP psych referral. 2. HTN 3. HL 4. Chronic anxiety  Plan - as above     Vy Badley D [facility] Pager [phone]   If 7PM-7AM, please contact night-coverage [email or web address] Password [email or web address] 04/18/2016, 6:47 PM

## 2016-04-18 NOTE — H&P (Deleted)
[facility] History and Physical  Sheryl Carter Huge ZOX:[id number] DOB: 09/16/1940 DOA: 04/18/2016  Referring physician: Dr. Clarene DukeMcManus PCP: Cassell SmilesFUSCO,LAWRENCE J., MD   Chief Complaint: Slurred speech, dizziness  HPI: Sheryl Carter [place] is a 76 y.o. female with hx of HTN, glaucoma, and anxiety presented with slurred speech and blurred vision starting today at approx 2pm.  Came to ED. Head CT here showed no acute changes. 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 Asked to see for poss CVA/ TIA.    Daughter provides most history . Patient with long hx of mental illness/ anxiety/ nervous breakdown.  She was admitted to psych facility [facility] in the 1980's.  Divorced age 76, never remarried. Both of her parents committed suicide.  Per the daughter she has periods of severe "anxiety" or "nervous breakdown", particularly surrounding medical issues or doctors/ hospitals.  These attacks involved slurred speech going back decades.  Also one of her brothers died 1 year ago.   Patient lives alone, has obsession with cleaning per dtr.  Hasn't driven for several years.  Her brother brought her here today due to pt's complaints of "not feeling right".  Takes Xanax at home and BP and cholesterol medications. 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    She had a discussion w the daughter this week about getting medical POA papers signes.  She also has told her daughter that she doesn't want any heroic or life-prolonging measures at all and was very serious about this ( no feeding tubes, CPR, etc).       ROS  denies CP  no joint pain   no HA  no blurry vision  no rash  no diarrhea  no nausea/ vomiting  no dysuria  no difficulty voiding  no change in urine color    Past Medical History  Past Medical History:  Diagnosis Date  . Anxiety   . Ear problems   . Glaucoma   . Hypertension    Past Surgical History  Past Surgical History:  Procedure  Laterality Date  . CHOLECYSTECTOMY    . EYE SURGERY     Family History No family history on file. Social History  reports that she has never smoked. She has never used smokeless tobacco. She reports that she does not drink alcohol or use drugs. Allergies  Allergies  Allergen Reactions  . Erythromycin     Reaction is unknown  . Other Hives    Mycins-   . Prednisone     Altered mental status   Home medications Prior to Admission medications   Medication Sig Start Date End Date Taking? Authorizing Provider  ALPRAZolam (XANAX) 0.25 MG tablet Take 0.25 mg by mouth 3 (three) times daily.   Yes Historical Provider, MD  aspirin EC 81 MG tablet Take 81 mg by mouth daily.   Yes Historical Provider, MD  esomeprazole (NEXIUM 24HR) 20 MG capsule Take 20 mg by mouth daily at 12 noon.   Yes Historical Provider, MD  lisinopril-hydrochlorothiazide (PRINZIDE,ZESTORETIC) 10-12.5 MG tablet Take 1 tablet by mouth daily. 02/23/16  Yes Historical Provider, MD  pravastatin (PRAVACHOL) 20 MG tablet Take 20 mg by mouth every evening.   Yes Historical Provider, MD  timolol (BETIMOL) 0.5 % ophthalmic solution Place 1 drop into both eyes 2 (two) times daily.   Yes Historical Provider, MD  vitamin B-12 (CYANOCOBALAMIN) 1000 MCG tablet Take 1,000 mcg by mouth daily.   Yes Historical Provider, MD  ondansetron (ZOFRAN ODT) 4 MG disintegrating tablet Take 1 tablet (4 mg total) by mouth every 8 (eight) hours as needed for nausea or vomiting. 04/08/16   Devoria Albe, MD   Liver Function Tests  Recent Labs Lab 04/18/16 1511  AST 25  ALT 16  ALKPHOS 66  BILITOT 0.9  PROT 7.5  ALBUMIN 4.1   No results for input(s): LIPASE, AMYLASE in the last 168 hours. CBC  Recent Labs Lab 04/18/16 1511 04/18/16 1526  WBC 10.0  --   NEUTROABS 5.6  --   HGB 15.1* 15.3*  HCT 42.9 45.0  MCV 89.7  --   PLT 273  --    Basic Metabolic Panel  Recent Labs Lab 04/18/16 1511 04/18/16 1526  NA 135 138  K 3.3* 3.5  CL 98*  102  CO2 28  --   GLUCOSE 109* 108*  BUN 17 18  CREATININE 0.85 0.90  CALCIUM 9.7  --      Vitals:   04/18/16 1515 04/18/16 1519 04/18/16 1530 04/18/16 1747  BP: 138/77 138/77    Pulse: 83 99 77   Resp: (!) 29  18   Temp:  98.3 F (36.8 C)  98.4 F (36.9 C)  TempSrc:  Oral    SpO2: 96% 99% 96%   Weight:  86.2 kg (190 lb)    Height:  4\' 11"  (1.499 Carter)     Exam: Gen alert, pleasant, no distress No rash, cyanosis or gangrene L cornea opaque Sclera anicteric, throat clear  No jvd or bruit Chest clear bilat RRR no MRG Abd soft ntnd no mass or ascites +bs GU defer MS no joint effusions or deformity Ext no LE edema / no wounds or ulcers Neuro - nonfocal UE's and LE's symmetric 4+/5 VF's intact R eye Ox 3 Judgement and attention wnl  Assessment: 1. Slurred speech - nonfocal on exam.  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  Other w/u neg so far, plan admit for CVA w/u.  If negative then this is likely due to known chronic anxiety (see above).  May need OP psych referral. 2. HTN 3. HL 4. Chronic anxiety  Plan - as above     Obdulio Mash D [facility] Pager [phone]   If 7PM-7AM, please contact night-coverage [email or web address] Password [facility] 04/18/2016, 6:22 PM

## 2016-04-18 NOTE — ED Notes (Signed)
Neurologist states patient is not candidate for tPA.  Continue 2 hour neuro checks.

## 2016-04-18 NOTE — ED Notes (Signed)
Pt back in room from CT at 1517.

## 2016-04-19 ENCOUNTER — Observation Stay (HOSPITAL_COMMUNITY): Payer: Medicare HMO

## 2016-04-19 DIAGNOSIS — R471 Dysarthria and anarthria: Secondary | ICD-10-CM | POA: Diagnosis not present

## 2016-04-19 DIAGNOSIS — R4781 Slurred speech: Secondary | ICD-10-CM | POA: Diagnosis not present

## 2016-04-19 DIAGNOSIS — I6523 Occlusion and stenosis of bilateral carotid arteries: Secondary | ICD-10-CM | POA: Diagnosis not present

## 2016-04-19 DIAGNOSIS — I509 Heart failure, unspecified: Secondary | ICD-10-CM

## 2016-04-19 DIAGNOSIS — R269 Unspecified abnormalities of gait and mobility: Secondary | ICD-10-CM | POA: Diagnosis not present

## 2016-04-19 DIAGNOSIS — R42 Dizziness and giddiness: Secondary | ICD-10-CM | POA: Diagnosis not present

## 2016-04-19 LAB — SEDIMENTATION RATE: SED RATE: 22 mm/h (ref 0–22)

## 2016-04-19 LAB — ECHOCARDIOGRAM COMPLETE
HEIGHTINCHES: 59 in
Weight: 2982.4 oz

## 2016-04-19 LAB — VITAMIN B12: Vitamin B-12: 3082 pg/mL — ABNORMAL HIGH (ref 180–914)

## 2016-04-19 MED ORDER — IOPAMIDOL (ISOVUE-370) INJECTION 76%
100.0000 mL | Freq: Once | INTRAVENOUS | Status: AC | PRN
  Administered 2016-04-19: 100 mL via INTRAVENOUS

## 2016-04-19 MED ORDER — PERFLUTREN LIPID MICROSPHERE
1.0000 mL | INTRAVENOUS | Status: AC | PRN
  Administered 2016-04-19: 2 mL via INTRAVENOUS
  Administered 2016-04-19: 1 mL via INTRAVENOUS
  Filled 2016-04-19: qty 10

## 2016-04-19 MED ORDER — SODIUM CHLORIDE 0.9 % IV SOLN
250.0000 mL | INTRAVENOUS | Status: DC
  Administered 2016-04-19: 250 mL via INTRAVENOUS

## 2016-04-19 NOTE — Care Management Obs Status (Signed)
MEDICARE OBSERVATION STATUS NOTIFICATION   Patient Details  Name: Sheryl Carter MRN: [id number] Date of Birth: 11/21/1940   Medicare Observation Status Notification Given:  Yes    Kymoni Lesperance, Chrystine OilerSharley Diane, RN 04/19/2016, 3:23 PM

## 2016-04-19 NOTE — Evaluation (Signed)
Physical Therapy Evaluation Patient Details Name: Sheryl Carter MRN: [id number] DOB: 06/03/1940 Today's Date: 04/19/2016   History of Present Illness  76 y.o. female with hx of HTN, glaucoma, and anxiety presented with slurred speech and blurred vision.  Head CT here showed no acute changes. 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  PMH: blind in L eye, vertigo, anxiety, and mental illness/ anxiety/ nervous breakdown  Clinical Impression  Pt received in bed, son initially present, however leaves during mobility assessment.  Pt is agreeable to PT evaluation.  She states that she is normally modified independent with ambulation using a cane, and she is independent with ADL's.  She recently gave up driving a few weeks ago, but still gets out into the community to run errands and go grocery shopping with dtr and other family.  During PT evaluation, she demonstrated all functional mobility at modified independent level including gait x 13000ft with cane.  She expressed that she is at her baseline for mobility.  Therefore, no skilled PT needs at this time, and will sign off.      Follow Up Recommendations No PT follow up    Equipment Recommendations  None recommended by PT    Recommendations for Other Services       Precautions / Restrictions Precautions Precautions: None Restrictions Weight Bearing Restrictions: No      Mobility  Bed Mobility Overal bed mobility: Modified Independent                Transfers Overall transfer level: Modified independent Equipment used: Straight cane                Ambulation/Gait Ambulation/Gait assistance: Modified independent (Device/Increase time) Ambulation Distance (Feet): 100 Feet Assistive device: Straight cane Gait Pattern/deviations: Step-through pattern;Wide base of support     General Gait Details: No unsteadiness.  Pt also holding onto the wall rail in the hallway, but she admits to furniture walking at home.    Stairs            Wheelchair Mobility    Modified Rankin (Stroke Patients Only)       Balance Overall balance assessment: Modified Independent                                           Pertinent Vitals/Pain Pain Assessment: No/denies pain    Home Living   Living Arrangements: Alone Available Help at Discharge: Available PRN/intermittently Lyda Jester(Curtis, son stops in every day, and has a shop right next door.  Dtr is a Runner, broadcasting/film/videoteacher and comes by 2-3 times per week.  Granddaughter will come by several times per week.  ) Type of Home: Mobile home Home Access: Ramped entrance     Home Layout: One level Home Equipment: Cane - single point      Prior Function     Gait / Transfers Assistance Needed: Pt uses her cane when she is going out into the community.  Pt states she was still driving until a few weeks ago when she had a bad episode of vertigo.   ADL's / Homemaking Assistance Needed: independent.  Pt likes to go with her dtr to the store and to run errands.          Hand Dominance   Dominant Hand: Right    Extremity/Trunk Assessment   Upper Extremity Assessment Upper Extremity Assessment: Overall WFL for tasks  assessed    Lower Extremity Assessment Lower Extremity Assessment: Overall WFL for tasks assessed       Communication   Communication: Other (comment) (blind in L eye)  Cognition Arousal/Alertness: Awake/alert Behavior During Therapy: WFL for tasks assessed/performed Overall Cognitive Status: Within Functional Limits for tasks assessed                      General Comments      Exercises     Assessment/Plan    PT Assessment Patent does not need any further PT services  PT Problem List            PT Treatment Interventions      PT Goals (Current goals can be found in the Care Plan section)  Acute Rehab PT Goals PT Goal Formulation: All assessment and education complete, DC therapy    Frequency     Barriers to  discharge        Co-evaluation               End of Session Equipment Utilized During Treatment: Gait belt Activity Tolerance: Patient tolerated treatment well Patient left: in bed;with call bell/phone within reach Nurse Communication: Mobility status Glee Arvin, RN notified, and mobiltiy sheet left hanging in the room. )    Functional Assessment Tool Used: [employer] AM-PAC "6-clicks"  Functional Limitation: Mobility: Walking and moving around Mobility: Walking and Moving Around Current Status [phone]): At least 1 percent but less than 20 percent impaired, limited or restricted Mobility: Walking and Moving Around Goal Status [phone]): At least 1 percent but less than 20 percent impaired, limited or restricted Mobility: Walking and Moving Around Discharge Status [phone]): At least 1 percent but less than 20 percent impaired, limited or restricted    Time: 1354-1417 PT Time Calculation (min) (ACUTE ONLY): 23 min   Charges:   PT Evaluation $PT Eval Low Complexity: 1 Procedure PT Treatments $Gait Training: 8-22 mins   PT G Codes:   PT G-Codes **NOT FOR INPATIENT CLASS** Functional Assessment Tool Used: [employer] "6-clicks"  Functional Limitation: Mobility: Walking and moving around Mobility: Walking and Moving Around Current Status [phone]): At least 1 percent but less than 20 percent impaired, limited or restricted Mobility: Walking and Moving Around Goal Status [phone]): At least 1 percent but less than 20 percent impaired, limited or restricted Mobility: Walking and Moving Around Discharge Status [phone]): At least 1 percent but less than 20 percent impaired, limited or restricted   Beth Kenika Sahm, PT, DPT X: [phone]

## 2016-04-19 NOTE — Progress Notes (Signed)
CODE STROKE 1503 BEEPER 1503 CALL 1510 EXAM STARTED 1511 EXAM FINISHED 1511 IMAGES SENT TO SOC 1516 EXAM COMPLETED IN EPIC 1516 [facility] CALLED

## 2016-04-19 NOTE — Consult Note (Signed)
[place] A. Sheryl Laughter, MD     [email or web address]          ODALYS Carter is an 76 y.o. female.   ASSESSMENT/PLAN: The patient has multiple symptoms without a clear urine findings diagnosis. Imaging test have ruled out any clear ischemic etiology. She does have significant anxiety symptoms and her symptoms may be due to that. 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 She is to continue with the aspirin 81 mg twice a day. Statin has been started a few days ago but she seemed to have some myalgias from this. I recommend holding the statin for month and restarting later.   Right hemicrania headache: I will obtain a ESR and C-reactive protein although I do not think that she has vasculitis.  Statin-induced myalgia:    The patient is 76 year old white female who has multiple psychiatric problems at baseline particularly anxiety disorder. The patient presents with the acute onset of dizziness described as a sensation as if she is going to fall. This happened about 1 PM yesterday and lasted for about 3 hours. The symptom was associated with dysarthria and a significant right hemicranial headache. The patient reports that she got up to clean her house and this is when she developed these symptoms. Prominence was also shortness of breath although she denies chest pain. No reports of clear focal numbness or weakness are reported. The patient has had multiple imaging of the brain which have been unrevealing for acute ischemic stroke or other acute intracranial processes. She appears to have moderate vertebral disease. It appears patient had similar symptoms about a week ago. She presented to the hospital for further evaluation. The daughter thinks that the spell were about the same but with the event in earlier this months she did not apparently have dysarthria. The ER physician's note indicated that she did have a sensation as if she was falling.  The note also indicated that she had a spell in November of last year where she had a sensation as if she is going to pass out or fall. These events seem to be associated with anxiety symptoms particularly shortness of breath. The patient takes alprazolam half a tablet at nighttime to help with sleep consolidation and anxiety symptoms. Sometimes she takes half a tablet in the daytime but she does not do this consistently. The patient has been started on a statin medication a few days ago before she developed the current spell. The patient loss the vision on the left due to complications of glaucoma. The review systems is otherwise negative.  GENERAL: Severe pleasant obese female in no acute distress.  HEENT: The left eye is clouded.  ABDOMEN: soft  EXTREMITIES: No edema. There is marked arthritic changes of the hands.   BACK: There is significant scoliosis of the lower thoracic and upper lumbar regions.  SKIN: Normal by inspection.    MENTAL STATUS: Alert and oriented. Speech, language and cognition are generally intact. Judgment and insight normal.   CRANIAL NERVES: Pupils are equal, round and reactive to light and accomodation; extra ocular movements are full, there is no significant nystagmus; visual fields are full; upper and lower facial muscles are normal in strength and symmetric, there is no flattening of the nasolabial folds; tongue is midline; uvula is midline; shoulder elevation is normal.  MOTOR: Normal tone, bulk and strength; no pronator drift.  COORDINATION: Left finger to nose is normal, right finger to nose shows slight past-pointing, No rest tremor; no  intention tremor; no postural tremor; no bradykinesia.  REFLEXES: Deep tendon reflexes are symmetrical and normal in the upper shoulders but diminished the legs requiring augmentation. Babinski reflexes are flexor bilaterally.   SENSATION: Normal to light touch and temperature.  GAIT: Normal.       Blood pressure  139/66, pulse 69, temperature 98.7 F (37.1 C), temperature source Oral, resp. rate 19, height '4\' 11"'  (1.499 m), weight 186 lb 6.4 oz (84.6 kg), SpO2 96 %.  Past Medical History:  Diagnosis Date  . Anxiety   . Ear problems   . Glaucoma   . Hypertension     Past Surgical History:  Procedure Laterality Date  . CHOLECYSTECTOMY    . EYE SURGERY      No family history on file.  Social History:  reports that she has never smoked. She has never used smokeless tobacco. She reports that she does not drink alcohol or use drugs.  Allergies:  Allergies  Allergen Reactions  . Erythromycin     Reaction is unknown  . Other Hives    Mycins-   . Prednisone     Altered mental status    Medications: Prior to Admission medications   Medication Sig Start Date End Date Taking? Authorizing Provider  ALPRAZolam (XANAX) 0.25 MG tablet Take 0.25 mg by mouth 3 (three) times daily.   Yes Historical Provider, MD  aspirin EC 81 MG tablet Take 81 mg by mouth daily.   Yes Historical Provider, MD  esomeprazole (NEXIUM 24HR) 20 MG capsule Take 20 mg by mouth daily at 12 noon.   Yes Historical Provider, MD  lisinopril-hydrochlorothiazide (PRINZIDE,ZESTORETIC) 10-12.5 MG tablet Take 1 tablet by mouth daily. 02/23/16  Yes Historical Provider, MD  pravastatin (PRAVACHOL) 20 MG tablet Take 20 mg by mouth every evening.   Yes Historical Provider, MD  timolol (BETIMOL) 0.5 % ophthalmic solution Place 1 drop into both eyes 2 (two) times daily.   Yes Historical Provider, MD  vitamin B-12 (CYANOCOBALAMIN) 1000 MCG tablet Take 1,000 mcg by mouth daily.   Yes Historical Provider, MD  ondansetron (ZOFRAN ODT) 4 MG disintegrating tablet Take 1 tablet (4 mg total) by mouth every 8 (eight) hours as needed for nausea or vomiting. 04/08/16   Rolland Porter, MD    Scheduled Meds: . aspirin EC  81 mg Oral Daily  . enoxaparin (LOVENOX) injection  40 mg Subcutaneous Q24H  . lisinopril  10 mg Oral Daily  . pantoprazole  40 mg  Oral Daily  . pravastatin  20 mg Oral QPM  . timolol  1 drop Both Eyes BID  . vitamin B-12  1,000 mcg Oral Daily   Continuous Infusions: PRN Meds:.acetaminophen **OR** acetaminophen, ALPRAZolam, ondansetron, sodium chloride flush, zolpidem     Results for orders placed or performed during the hospital encounter of 04/18/16 (from the past 48 hour(s))  CBG monitoring, ED     Status: Abnormal   Collection Time: 04/18/16  3:06 PM  Result Value Ref Range   Glucose-Capillary 105 (H) 65 - 99 mg/dL  Ethanol     Status: None   Collection Time: 04/18/16  3:11 PM  Result Value Ref Range   Alcohol, Ethyl (B) <5 <5 mg/dL    Comment:        LOWEST DETECTABLE LIMIT FOR SERUM ALCOHOL IS 5 mg/dL FOR MEDICAL PURPOSES ONLY   Protime-INR     Status: None   Collection Time: 04/18/16  3:11 PM  Result Value Ref Range   Prothrombin Time  12.0 11.4 - 15.2 seconds   INR 0.89   APTT     Status: None   Collection Time: 04/18/16  3:11 PM  Result Value Ref Range   aPTT 32 24 - 36 seconds  CBC     Status: Abnormal   Collection Time: 04/18/16  3:11 PM  Result Value Ref Range   WBC 10.0 4.0 - 10.5 K/uL   RBC 4.78 3.87 - 5.11 MIL/uL   Hemoglobin 15.1 (H) 12.0 - 15.0 g/dL   HCT 42.9 36.0 - 46.0 %   MCV 89.7 78.0 - 100.0 fL   MCH 31.6 26.0 - 34.0 pg   MCHC 35.2 30.0 - 36.0 g/dL   RDW 12.4 11.5 - 15.5 %   Platelets 273 150 - 400 K/uL  Differential     Status: None   Collection Time: 04/18/16  3:11 PM  Result Value Ref Range   Neutrophils Relative % 57 %   Neutro Abs 5.6 1.7 - 7.7 K/uL   Lymphocytes Relative 37 %   Lymphs Abs 3.7 0.7 - 4.0 K/uL   Monocytes Relative 5 %   Monocytes Absolute 0.5 0.1 - 1.0 K/uL   Eosinophils Relative 1 %   Eosinophils Absolute 0.1 0.0 - 0.7 K/uL   Basophils Relative 0 %   Basophils Absolute 0.0 0.0 - 0.1 K/uL  Comprehensive metabolic panel     Status: Abnormal   Collection Time: 04/18/16  3:11 PM  Result Value Ref Range   Sodium 135 135 - 145 mmol/L   Potassium  3.3 (L) 3.5 - 5.1 mmol/L   Chloride 98 (L) 101 - 111 mmol/L   CO2 28 22 - 32 mmol/L   Glucose, Bld 109 (H) 65 - 99 mg/dL   BUN 17 6 - 20 mg/dL   Creatinine, Ser 0.85 0.44 - 1.00 mg/dL   Calcium 9.7 8.9 - 10.3 mg/dL   Total Protein 7.5 6.5 - 8.1 g/dL   Albumin 4.1 3.5 - 5.0 g/dL   AST 25 15 - 41 U/L   ALT 16 14 - 54 U/L   Alkaline Phosphatase 66 38 - 126 U/L   Total Bilirubin 0.9 0.3 - 1.2 mg/dL   GFR calc non Af Amer >60 >60 mL/min   GFR calc Af Amer >60 >60 mL/min    Comment: (NOTE) The eGFR has been calculated using the CKD EPI equation. This calculation has not been validated in all clinical situations. eGFR's persistently <60 mL/min signify possible Chronic Kidney Disease.    Anion gap 9 5 - 15  Urine rapid drug screen (hosp performed)not at [facility]     Status: None   Collection Time: 04/18/16  3:11 PM  Result Value Ref Range   Opiates NONE DETECTED NONE DETECTED   Cocaine NONE DETECTED NONE DETECTED   Benzodiazepines NONE DETECTED NONE DETECTED   Amphetamines NONE DETECTED NONE DETECTED   Tetrahydrocannabinol NONE DETECTED NONE DETECTED   Barbiturates NONE DETECTED NONE DETECTED    Comment:        DRUG SCREEN FOR MEDICAL PURPOSES ONLY.  IF CONFIRMATION IS NEEDED FOR ANY PURPOSE, NOTIFY LAB WITHIN 5 DAYS.        LOWEST DETECTABLE LIMITS FOR URINE DRUG SCREEN Drug Class       Cutoff (ng/mL) Amphetamine      1000 Barbiturate      200 Benzodiazepine   017 Tricyclics       510 Opiates          300 Cocaine  300 THC              50   Urinalysis, Routine w reflex microscopic     Status: Abnormal   Collection Time: 04/18/16  3:11 PM  Result Value Ref Range   Color, Urine STRAW (A) YELLOW   APPearance CLEAR CLEAR   Specific Gravity, Urine 1.005 1.005 - 1.030   pH 6.0 5.0 - 8.0   Glucose, UA NEGATIVE NEGATIVE mg/dL   Hgb urine dipstick NEGATIVE NEGATIVE   Bilirubin Urine NEGATIVE NEGATIVE   Ketones, ur NEGATIVE NEGATIVE mg/dL   Protein, ur NEGATIVE NEGATIVE  mg/dL   Nitrite NEGATIVE NEGATIVE   Leukocytes, UA TRACE (A) NEGATIVE   RBC / HPF 0-5 0 - 5 RBC/hpf   WBC, UA 0-5 0 - 5 WBC/hpf   Bacteria, UA MANY (A) NONE SEEN  I-stat troponin, ED (not at [facility], [facility])     Status: None   Collection Time: 04/18/16  3:25 PM  Result Value Ref Range   Troponin i, poc 0.01 0.00 - 0.08 ng/mL   Comment 3            Comment: Due to the release kinetics of cTnI, a negative result within the first hours of the onset of symptoms does not rule out myocardial infarction with certainty. If myocardial infarction is still suspected, repeat the test at appropriate intervals.   I-Stat Chem 8, ED  (not at [facility], [facility])     Status: Abnormal   Collection Time: 04/18/16  3:26 PM  Result Value Ref Range   Sodium 138 135 - 145 mmol/L   Potassium 3.5 3.5 - 5.1 mmol/L   Chloride 102 101 - 111 mmol/L   BUN 18 6 - 20 mg/dL   Creatinine, Ser 0.90 0.44 - 1.00 mg/dL   Glucose, Bld 108 (H) 65 - 99 mg/dL   Calcium, Ion 1.23 1.15 - 1.40 mmol/L   TCO2 28 0 - 100 mmol/L   Hemoglobin 15.3 (H) 12.0 - 15.0 g/dL   HCT 45.0 36.0 - 46.0 %    Studies/Results:   BRAIN NECK CTA FINDINGS: CTA NECK FINDINGS  Aortic arch: 2 vessel branching. No notable atherosclerosis. Negative for stenosis  Right carotid system:Mixed calcified and noncalcified atherosclerotic plaque at the common carotid bifurcation and carotid bulb with stenosis measuring up to 40%. No ulceration or dissection noted.  Left carotid system: Mixed calcified and noncalcified plaque along the carotid bulb with narrowing measuring up to 60%. No ulceration or dissection noted.  Vertebral arteries: No brachiocephalic or proximal subclavian stenosis. The dominant left vertebral artery is tortuous but smooth and widely patent to the dura. Non dominant right vertebral artery is occluded just beyond the origin with reconstitution at the level of the V2 segment and continuing to the dura. Where opacified, no luminal  irregularity suspicious for acute dissection.  Skeleton: No acute or aggressive finding  Other neck: No incidentally detected mass or adenopathy.  Upper chest: Coronary atherosclerotic calcification. Airway thickening.  Review of the MIP images confirms the above findings  CTA HEAD FINDINGS  Anterior circulation: Symmetric carotid arteries. Atherosclerotic calcification on the siphons with mild supraclinoid ICA narrowing bilaterally. No major branch occlusion. No beading or aneurysm.  Posterior circulation: Strongly dominant left vertebral artery. Visible right vertebral artery ends in PICA. Based on conventional brain MRI from yesterday, there is an occluded distal right V4 segment. Between AICAs and superior cerebellar arteries these is a focal moderate to advanced basilar narrowing that is smooth and atherosclerotic  appearing. Moderate left P3 segment stenosis.  Venous sinuses: Patent  Anatomic variants: Fetal type PCA on the left.  Delayed phase: No parenchymal enhancement or mass.  Review of the MIP images confirms the above findings  IMPRESSION: 1. No acute finding or change from yesterday. 2. 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 No luminal irregularity in the opacified vessel strongly concerning for acute dissection in this patient with posterior headache. 3. 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 Lack of acute infarcts favors this to be chronic. 4. Cervical carotid atherosclerosis with proximal ICA narrowing measuring ~60% on the left and less than 50% on the right. 5. 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     BRAIN MRI FINDINGS: Brain: There is no evidence of acute infarct, intracranial hemorrhage, mass, midline shift, or extra-axial fluid collection. Mild-to-moderate enlargement of the lateral and third ventricles and sylvian fissures favored to reflect cerebral atrophy. There is a small chronic  infarct in the posterior right cerebellar hemisphere. Patchy T2 hyperintensities in the subcortical and deep cerebral white matter bilaterally are nonspecific but compatible with mild-to-moderate chronic small vessel ischemic disease.  Vascular: Major intracranial vascular flow voids are preserved, with the left vertebral artery dominant.  Skull and upper cervical spine: Unremarkable bone marrow signal.  Sinuses/Orbits: Prior left cataract extraction. Small mastoid effusions. Clear paranasal sinuses.  Other: None.  IMPRESSION: 1. No acute intracranial abnormality. 2. Chronic right cerebellar infarct. 3. Mild-to-moderate chronic small vessel ischemic disease and cerebral atrophy.    BRAIN NECK MRA FINDINGS: MRA NECK FINDINGS  ANTERIOR CIRCULATION: The common carotid arteries are widely patent bilaterally. Two vessel arch is a normal variant. The carotid bifurcation is patent bilaterally and there is no hemodynamically significant carotid stenosis by NASCET criteria. Less than than 50% stenosis LEFT internal carotid artery origin. No evidence for atherosclerosis or flow limiting stenosis of the cervical internal carotid arteries.  POSTERIOR CIRCULATION: Loss of RIGHT vertebral artery flow related enhancement from the origin, trace RIGHT V2 segment reconstitution.  MRA HEAD FINDINGS  ANTERIOR CIRCULATION: Normal flow related enhancement of the included cervical, petrous, cavernous and supraclinoid internal carotid arteries. Patent anterior communicating artery. Normal flow related enhancement of the anterior and middle cerebral arteries, including distal segments.  No large vessel occlusion, high-grade stenosis, abnormal luminal irregularity, aneurysm.  POSTERIOR CIRCULATION: Tiny RIGHT posterior-inferior cerebellar artery present, no definite intervertebral RIGHT vertebral artery. Patent basilar artery and main branch vessels. Moderate stenosis LEFT  superior cerebral artery origin. Bilateral posterior cerebral arteries are patent. Mild stenosis proximal LEFT P3 segment compatible with atherosclerosis.  ANATOMIC VARIANTS: Fetal origin LEFT posterior cerebral artery.  Source images and MIP image were reviewed.  IMPRESSION: MRA neck: Occluded RIGHT vertebral artery from the origin distally, thready incomplete V2 segment reconstitution.  LEFT than 50% stenosis LEFT internal carotid artery origin.  MRA head: Nonvisualized RIGHT V4 segment, however patent RIGHT PICA.  Moderate stenosis LEFT SCA origin.       Markia Kyer A. Sheryl Carter, M.D.  Diplomate, Tax adviser of Psychiatry and Neurology ( Neurology). 04/19/2016, 5:33 PM

## 2016-04-19 NOTE — Discharge Instructions (Signed)
Follow with Primary MD FUSCO,LAWRENCE J., MD in 7 days  ° °Get CBC, CMP, 2 view Chest X ray checked  by Primary MD or SNF MD in 5-7 days ( we routinely change or add medications that can affect your baseline labs and fluid status, therefore we recommend that you get the mentioned basic workup next visit with your PCP, your PCP may decide not to get them or add new tests based on their clinical decision) ° ° °Activity: As tolerated with Full fall precautions use walker/cane & assistance as needed ° ° °Disposition Home  ° ° °Diet:   Heart Healthy   ° °For Heart failure patients - Check your Weight same time everyday, if you gain over 2 pounds, or you develop in leg swelling, experience more shortness of breath or chest pain, call your Primary MD immediately. Follow Cardiac Low Salt Diet and 1.5 lit/day fluid restriction. ° ° °On your next visit with your primary care physician please Get Medicines reviewed and adjusted. ° ° °Please request your Prim.MD to go over all Hospital Tests and Procedure/Radiological results at the follow up, please get all Hospital records sent to your Prim MD by signing hospital release before you go home. ° ° °If you experience worsening of your admission symptoms, develop shortness of breath, life threatening emergency, suicidal or homicidal thoughts you must seek medical attention immediately by calling 911 or calling your MD immediately  if symptoms less severe. ° °You Must read complete instructions/literature along with all the possible adverse reactions/side effects for all the Medicines you take and that have been prescribed to you. Take any new Medicines after you have completely understood and accpet all the possible adverse reactions/side effects.  ° °Do not drive, operate heavy machinery, perform activities at heights, swimming or participation in water activities or provide baby sitting services if your were admitted for syncope or siezures until you have seen by Primary MD or a  Neurologist and advised to do so again. ° °Do not drive when taking Pain medications.  ° ° °Do not take more than prescribed Pain, Sleep and Anxiety Medications ° °Special Instructions: If you have smoked or chewed Tobacco  in the last 2 yrs please stop smoking, stop any regular Alcohol  and or any Recreational drug use. ° °Wear Seat belts while driving. ° ° °Please note ° °You were cared for by a hospitalist during your hospital stay. If you have any questions about your discharge medications or the care you received while you were in the hospital after you are discharged, you can call the unit and asked to speak with the hospitalist on call if the hospitalist that took care of you is not available. Once you are discharged, your primary care physician will handle any further medical issues. Please note that NO REFILLS for any discharge medications will be authorized once you are discharged, as it is imperative that you return to your primary care physician (or establish a relationship with a primary care physician if you do not have one) for your aftercare needs so that they can reassess your need for medications and monitor your lab values. ° °

## 2016-04-19 NOTE — Care Management Note (Signed)
Case Management Note  Patient Details  Name: Sheryl Carter MRN: [id number] Date of Birth: 06/28/1940  Subjective/Objective:       Patient from home, ind with ADL's. Walk with a cane, has PCP (son drives her to appointments), and she report no issues affording medications. No HH PTA.            Action/Plan: Anticipate DC home in the am. No CM needs know.   Expected Discharge Date:          04/20/2016        Expected Discharge Plan:  Home/Self Care  In-House Referral:  NA  Discharge planning Services  CM Consult  Post Acute Care Choice:  NA Choice offered to:  NA  DME Arranged:    DME Agency:     HH Arranged:    HH Agency:     Status of Service:  Completed, signed off  If discussed at [employer] Length of Stay Meetings, dates discussed:    Additional Comments:  Yasmeen Manka, Chrystine OilerSharley Diane, RN 04/19/2016, 3:21 PM

## 2016-04-19 NOTE — Progress Notes (Signed)
PROGRESS  NOTE                                                                                                                                                                                                             Patient Demographics:    Sheryl Carter, is a 76 y.o. female, DOB - [birth year], WUJ:[id number]  Admit date - 04/18/2016   Admitting Physician Delano Metz, MD  Outpatient Primary MD for the patient is [name], MD  LOS - 0  Chief Complaint  Patient presents with  . Weakness       Brief Narrative  Sheryl Carter is a 76 y.o. female with hx of HTN, glaucoma, and anxiety presented with slurred speech and blurred vision starting today at approx 2pm.  Came to ED. Head CT here showed no acute changes. 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 She was admitted for TIA workup.   Subjective:    Sheryl Carter today has, No headache, No chest pain, No abdominal pain - No Nausea, No new weakness tingling or numbness, No Cough - SOB.     Assessment  & Plan :     1.TIA. Symptoms completely resolved. MRI nonacute, CT angiogram head and neck shows ICA narrowing measuring 60% on the left and less than 50% on the right, echocardiogram, A1c and lipid panel pending, he is on aspirin and statin which will be continued, PT to evaluate, no speech or swallowing impediments and does not require OT or speech evaluation. We'll request neuro to also evaluate this evening.    2. HTN - on lisinopril continue.  3. Dyslipidemia. On statin continue, fasting lipid panel pending.  4. History of vitamin B-12 deficiency. On replacement. Check B-12 level.  5. GERD. On PPI.    Family Communication  :  Family bedside  Code Status :  DNR  Diet : Diet Heart Room service appropriate? Yes; Fluid consistency: Thin   Disposition Plan  :  Home in  am  Consults  :  Neuro  Procedures  :   MRI MRA head. CT angiogram neck. Non Acute, CT angiogram head and neck shows ICA narrowing measuring 60% on the left and less than 50% on the right  DVT Prophylaxis  :  Lovenox    Lab Results  Component Value Date  PLT 273 04/18/2016    Inpatient Medications  Scheduled Meds: . aspirin EC  81 mg Oral Daily  . enoxaparin (LOVENOX) injection  40 mg Subcutaneous Q24H  . lisinopril  10 mg Oral Daily  . pantoprazole  40 mg Oral Daily  . pravastatin  20 mg Oral QPM  . timolol  1 drop Both Eyes BID  . vitamin B-12  1,000 mcg Oral Daily   Continuous Infusions: . sodium chloride 250 mL (04/19/16 0911)   PRN Meds:.acetaminophen **OR** acetaminophen, ALPRAZolam, ondansetron, sodium chloride flush, zolpidem  Antibiotics  :    Anti-infectives    None         Objective:   Vitals:   04/18/16 1836 04/18/16 1900 04/18/16 2028 04/19/16 0633  BP:  113/72 118/76 120/79  Pulse: 67 64 61 65  Resp: 17 22 20 20   Temp:   97.8 F (36.6 C) 97.8 F (36.6 C)  TempSrc:   Oral Oral  SpO2: 97% 100% 95% 100%  Weight:   84.6 kg (186 lb 6.4 oz)   Height:   4\' 11"  (1.499 m)     Wt Readings from Last 3 Encounters:  04/18/16 84.6 kg (186 lb 6.4 oz)  04/07/16 86.2 kg (190 lb)     Intake/Output Summary (Last 24 hours) at 04/19/16 1123 Last data filed at 04/19/16 0911  Gross per 24 hour  Intake              360 ml  Output                0 ml  Net              360 ml     Physical Exam  Awake Alert, Oriented X 3, No new F.N deficits, Normal affect [place].AT,  blind in left eye Supple Neck,No JVD, No cervical lymphadenopathy appriciated.  Symmetrical Chest wall movement, Good air movement bilaterally, CTAB RRR,No Gallops,Rubs or new Murmurs, No Parasternal Heave +ve B.Sounds, Abd Soft, No tenderness, No organomegaly appriciated, No rebound - guarding or rigidity. No Cyanosis, Clubbing or edema, No new Rash or bruise       Data Review:     CBC  Recent Labs Lab 04/18/16 1511 04/18/16 1526  WBC 10.0  --   HGB 15.1* 15.3*  HCT 42.9 45.0  PLT 273  --   MCV 89.7  --   MCH 31.6  --   MCHC 35.2  --   RDW 12.4  --   LYMPHSABS 3.7  --   MONOABS 0.5  --   EOSABS 0.1  --   BASOSABS 0.0  --     Chemistries   Recent Labs Lab 04/18/16 1511 04/18/16 1526  NA 135 138  K 3.3* 3.5  CL 98* 102  CO2 28  --   GLUCOSE 109* 108*  BUN 17 18  CREATININE 0.85 0.90  CALCIUM 9.7  --   AST 25  --   ALT 16  --   ALKPHOS 66  --   BILITOT 0.9  --    ------------------------------------------------------------------------------------------------------------------ No results for input(s): CHOL, HDL, LDLCALC, TRIG, CHOLHDL, LDLDIRECT in the last 72 hours.  No results found for: HGBA1C ------------------------------------------------------------------------------------------------------------------ No results for input(s): TSH, T4TOTAL, T3FREE, THYROIDAB in the last 72 hours.  Invalid input(s): FREET3 ------------------------------------------------------------------------------------------------------------------ No results for input(s): VITAMINB12, FOLATE, FERRITIN, TIBC, IRON, RETICCTPCT in the last 72 hours.  Coagulation profile  Recent Labs Lab 04/18/16 1511  INR 0.89    No results  for input(s): DDIMER in the last 72 hours.  Cardiac Enzymes No results for input(s): CKMB, TROPONINI, MYOGLOBIN in the last 168 hours.  Invalid input(s): CK ------------------------------------------------------------------------------------------------------------------ No results found for: BNP  Micro Results No results found for this or any previous visit (from the past 240 hour(s)).  Radiology Reports Ct Angio Head W Or Wo Contrast  Result Date: 04/19/2016 CLINICAL DATA:  Posterior headache radiating to the right ear for 1 day. Dizziness and blurred vision. Slurred speech starting yesterday. EXAM: CT ANGIOGRAPHY HEAD AND  NECK TECHNIQUE: Multidetector CT imaging of the head and neck was performed using the standard protocol during bolus administration of intravenous contrast. Multiplanar CT image reconstructions and MIPs were obtained to evaluate the vascular anatomy. Carotid stenosis measurements (when applicable) are obtained utilizing NASCET criteria, using the distal internal carotid diameter as the denominator. CONTRAST:  100 cc Isovue 370 intravenous COMPARISON:  MRA of the head neck from yesterday. FINDINGS: CTA NECK FINDINGS Aortic arch: 2 vessel branching. No notable atherosclerosis. Negative for stenosis Right carotid system:Mixed calcified and noncalcified atherosclerotic plaque at the common carotid bifurcation and carotid bulb with stenosis measuring up to 40%. No ulceration or dissection noted. Left carotid system: Mixed calcified and noncalcified plaque along the carotid bulb with narrowing measuring up to 60%. No ulceration or dissection noted. Vertebral arteries: No brachiocephalic or proximal subclavian stenosis. The dominant left vertebral artery is tortuous but smooth and widely patent to the dura. Non dominant right vertebral artery is occluded just beyond the origin with reconstitution at the level of the V2 segment and continuing to the dura. Where opacified, no luminal irregularity suspicious for acute dissection. Skeleton: No acute or aggressive finding Other neck: No incidentally detected mass or adenopathy. Upper chest: Coronary atherosclerotic calcification. Airway thickening. Review of the MIP images confirms the above findings CTA HEAD FINDINGS Anterior circulation: Symmetric carotid arteries. Atherosclerotic calcification on the siphons with mild supraclinoid ICA narrowing bilaterally. No major branch occlusion. No beading or aneurysm. Posterior circulation: Strongly dominant left vertebral artery. Visible right vertebral artery ends in PICA. Based on conventional brain MRI from yesterday, there is an  occluded distal right V4 segment. Between AICAs and superior cerebellar arteries these is a focal moderate to advanced basilar narrowing that is smooth and atherosclerotic appearing. Moderate left P3 segment stenosis. Venous sinuses: Patent Anatomic variants: Fetal type PCA on the left. Delayed phase: No parenchymal enhancement or mass. Review of the MIP images confirms the above findings IMPRESSION: 1. No acute finding or change from yesterday. 2. 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 No luminal irregularity in the opacified vessel strongly concerning for acute dissection in this patient with posterior headache. 3. 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 Lack of acute infarcts favors this to be chronic. 4. Cervical carotid atherosclerosis with proximal ICA narrowing measuring ~60% on the left and less than 50% on the right. 5. 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 Electronically Signed   By: Marnee Spring M.D.   On: 04/19/2016 10:51   Ct Head Wo Contrast  Result Date: 04/08/2016 CLINICAL DATA:  Chronic dizziness. Right-sided headache and posterior head pain. Nausea. Initial encounter. EXAM: CT HEAD WITHOUT CONTRAST TECHNIQUE: Contiguous axial images were obtained from the base of the skull through the vertex without intravenous contrast. COMPARISON:  CT of the head performed 11/14/2007 FINDINGS: Brain: No evidence of acute infarction, hemorrhage, hydrocephalus, extra-axial collection or mass lesion/mass effect. Prominence of the ventricles and sulci reflects mild to moderate cortical volume  loss. Mild cerebellar atrophy is noted. Scattered periventricular and subcortical white matter change likely reflects small vessel ischemic microangiopathy. 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 The brainstem and fourth ventricle are within normal limits. The basal ganglia are unremarkable in appearance. The cerebral hemispheres demonstrate grossly  normal gray-white differentiation. No mass effect or midline shift is seen. Vascular: No hyperdense vessel or unexpected calcification. Skull: There is no evidence of fracture; visualized osseous structures are unremarkable in appearance. Sinuses/Orbits: The orbits are within normal limits. The paranasal sinuses and mastoid air cells are well-aerated. Other: No significant soft tissue abnormalities are seen. IMPRESSION: 1. No acute intracranial pathology seen on CT. 2. Mild to moderate cortical volume loss and scattered small vessel ischemic microangiopathy. 3. 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 Electronically Signed   By: Roanna Raider M.D.   On: 04/08/2016 01:19   Ct Angio Neck W Or Wo Contrast  Result Date: 04/19/2016 CLINICAL DATA:  Posterior headache radiating to the right ear for 1 day. Dizziness and blurred vision. Slurred speech starting yesterday. EXAM: CT ANGIOGRAPHY HEAD AND NECK TECHNIQUE: Multidetector CT imaging of the head and neck was performed using the standard protocol during bolus administration of intravenous contrast. Multiplanar CT image reconstructions and MIPs were obtained to evaluate the vascular anatomy. Carotid stenosis measurements (when applicable) are obtained utilizing NASCET criteria, using the distal internal carotid diameter as the denominator. CONTRAST:  100 cc Isovue 370 intravenous COMPARISON:  MRA of the head neck from yesterday. FINDINGS: CTA NECK FINDINGS Aortic arch: 2 vessel branching. No notable atherosclerosis. Negative for stenosis Right carotid system:Mixed calcified and noncalcified atherosclerotic plaque at the common carotid bifurcation and carotid bulb with stenosis measuring up to 40%. No ulceration or dissection noted. Left carotid system: Mixed calcified and noncalcified plaque along the carotid bulb with narrowing measuring up to 60%. No ulceration or dissection noted. Vertebral arteries: No brachiocephalic or proximal subclavian  stenosis. The dominant left vertebral artery is tortuous but smooth and widely patent to the dura. Non dominant right vertebral artery is occluded just beyond the origin with reconstitution at the level of the V2 segment and continuing to the dura. Where opacified, no luminal irregularity suspicious for acute dissection. Skeleton: No acute or aggressive finding Other neck: No incidentally detected mass or adenopathy. Upper chest: Coronary atherosclerotic calcification. Airway thickening. Review of the MIP images confirms the above findings CTA HEAD FINDINGS Anterior circulation: Symmetric carotid arteries. Atherosclerotic calcification on the siphons with mild supraclinoid ICA narrowing bilaterally. No major branch occlusion. No beading or aneurysm. Posterior circulation: Strongly dominant left vertebral artery. Visible right vertebral artery ends in PICA. Based on conventional brain MRI from yesterday, there is an occluded distal right V4 segment. Between AICAs and superior cerebellar arteries these is a focal moderate to advanced basilar narrowing that is smooth and atherosclerotic appearing. Moderate left P3 segment stenosis. Venous sinuses: Patent Anatomic variants: Fetal type PCA on the left. Delayed phase: No parenchymal enhancement or mass. Review of the MIP images confirms the above findings IMPRESSION: 1. No acute finding or change from yesterday. 2. 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 No luminal irregularity in the opacified vessel strongly concerning for acute dissection in this patient with posterior headache. 3. 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 Lack of acute infarcts favors this to be chronic. 4. Cervical carotid atherosclerosis with proximal ICA narrowing measuring ~60% on the left and less than 50% on the right. 5. 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 Electronically Signed   By: Marnee SpringJonathon  Watts M.D.   On: 04/19/2016 10:51   Mr Maxine GlennMra Neck Wo  Contrast  Result Date: 04/18/2016 CLINICAL DATA:  Slurred speech. History of hypertension and hyperlipidemia. EXAM: MRA HEAD WITHOUT CONTRAST MRA NECK WITHOUT CONTRAST TECHNIQUE: Angiographic images of the Circle of Willis were obtained using MRA technique without intravenous contrast. 3D time-of-flight angiographic images of the neck were obtained without intravenous contrast. Carotid stenosis measurements (when applicable) are obtained utilizing NASCET criteria, using the distal internal carotid diameter as the denominator. COMPARISON:  MRI head April 18, 2016 at 1622 hours FINDINGS: MRA NECK FINDINGS ANTERIOR CIRCULATION: The common carotid arteries are widely patent bilaterally. Two vessel arch is a normal variant. The carotid bifurcation is patent bilaterally and there is no hemodynamically significant carotid stenosis by NASCET criteria. Less than than 50% stenosis LEFT internal carotid artery origin. No evidence for atherosclerosis or flow limiting stenosis of the cervical internal carotid arteries. POSTERIOR CIRCULATION: Loss of RIGHT vertebral artery flow related enhancement from the origin, trace RIGHT V2 segment reconstitution. MRA HEAD FINDINGS ANTERIOR CIRCULATION: Normal flow related enhancement of the included cervical, petrous, cavernous and supraclinoid internal carotid arteries. Patent anterior communicating artery. Normal flow related enhancement of the anterior and middle cerebral arteries, including distal segments. No large vessel occlusion, high-grade stenosis, abnormal luminal irregularity, aneurysm. POSTERIOR CIRCULATION: Tiny RIGHT posterior-inferior cerebellar artery present, no definite intervertebral RIGHT vertebral artery. Patent basilar artery and main branch vessels. Moderate stenosis LEFT superior cerebral artery origin. Bilateral posterior cerebral arteries are patent. Mild stenosis proximal LEFT P3 segment compatible with atherosclerosis. ANATOMIC VARIANTS: Fetal origin LEFT  posterior cerebral artery. Source images and MIP image were reviewed. IMPRESSION: MRA neck: Occluded RIGHT vertebral artery from the origin distally, thready incomplete V2 segment reconstitution. LEFT than 50% stenosis LEFT internal carotid artery origin. MRA head: Nonvisualized RIGHT V4 segment, however patent RIGHT PICA. Moderate stenosis LEFT SCA origin. Electronically Signed   By: Awilda Metroourtnay  Bloomer M.D.   On: 04/18/2016 19:07   [name] (neuro Protocol)  Result Date: 04/18/2016 CLINICAL DATA:  Slurred speech. EXAM: MRI HEAD WITHOUT CONTRAST TECHNIQUE: Multiplanar, multiecho pulse sequences of the brain and surrounding structures were obtained without intravenous contrast. COMPARISON:  Head CT 04/18/2016 FINDINGS: Brain: There is no evidence of acute infarct, intracranial hemorrhage, mass, midline shift, or extra-axial fluid collection. Mild-to-moderate enlargement of the lateral and third ventricles and sylvian fissures favored to reflect cerebral atrophy. There is a small chronic infarct in the posterior right cerebellar hemisphere. Patchy T2 hyperintensities in the subcortical and deep cerebral white matter bilaterally are nonspecific but compatible with mild-to-moderate chronic small vessel ischemic disease. Vascular: Major intracranial vascular flow voids are preserved, with the left vertebral artery dominant. Skull and upper cervical spine: Unremarkable bone marrow signal. Sinuses/Orbits: Prior left cataract extraction. Small mastoid effusions. Clear paranasal sinuses. Other: None. IMPRESSION: 1. No acute intracranial abnormality. 2. Chronic right cerebellar infarct. 3. Mild-to-moderate chronic small vessel ischemic disease and cerebral atrophy. Electronically Signed   By: Sebastian AcheAllen  Grady M.D.   On: 04/18/2016 16:50   Mr Maxine GlennMra Head (cerebral Arteries)  Result Date: 04/18/2016 CLINICAL DATA:  Slurred speech. History of hypertension and hyperlipidemia. EXAM: MRA HEAD WITHOUT CONTRAST MRA NECK  WITHOUT CONTRAST TECHNIQUE: Angiographic images of the Circle of Willis were obtained using MRA technique without intravenous contrast. 3D time-of-flight angiographic images of the neck were obtained without intravenous contrast. Carotid stenosis measurements (when applicable) are obtained utilizing NASCET criteria,  using the distal internal carotid diameter as the denominator. COMPARISON:  MRI head April 18, 2016 at 1622 hours FINDINGS: MRA NECK FINDINGS ANTERIOR CIRCULATION: The common carotid arteries are widely patent bilaterally. Two vessel arch is a normal variant. The carotid bifurcation is patent bilaterally and there is no hemodynamically significant carotid stenosis by NASCET criteria. Less than than 50% stenosis LEFT internal carotid artery origin. No evidence for atherosclerosis or flow limiting stenosis of the cervical internal carotid arteries. POSTERIOR CIRCULATION: Loss of RIGHT vertebral artery flow related enhancement from the origin, trace RIGHT V2 segment reconstitution. MRA HEAD FINDINGS ANTERIOR CIRCULATION: Normal flow related enhancement of the included cervical, petrous, cavernous and supraclinoid internal carotid arteries. Patent anterior communicating artery. Normal flow related enhancement of the anterior and middle cerebral arteries, including distal segments. No large vessel occlusion, high-grade stenosis, abnormal luminal irregularity, aneurysm. POSTERIOR CIRCULATION: Tiny RIGHT posterior-inferior cerebellar artery present, no definite intervertebral RIGHT vertebral artery. Patent basilar artery and main branch vessels. Moderate stenosis LEFT superior cerebral artery origin. Bilateral posterior cerebral arteries are patent. Mild stenosis proximal LEFT P3 segment compatible with atherosclerosis. ANATOMIC VARIANTS: Fetal origin LEFT posterior cerebral artery. Source images and MIP image were reviewed. IMPRESSION: MRA neck: Occluded RIGHT vertebral artery from the origin distally,  thready incomplete V2 segment reconstitution. LEFT than 50% stenosis LEFT internal carotid artery origin. MRA head: Nonvisualized RIGHT V4 segment, however patent RIGHT PICA. Moderate stenosis LEFT SCA origin. Electronically Signed   By: Awilda Metro M.D.   On: 04/18/2016 19:07   Ct Head Code Stroke W/o Cm  Result Date: 04/18/2016 CLINICAL DATA:  Code stroke.  Slurred speech EXAM: CT HEAD WITHOUT CONTRAST TECHNIQUE: Contiguous axial images were obtained from the base of the skull through the vertex without intravenous contrast. COMPARISON:  04/08/2016 FINDINGS: Brain: No evidence of acute infarction, hemorrhage, hydrocephalus, extra-axial collection or mass lesion/mass effect. Lateral and third ventriculomegaly favored secondary to volume loss given lack of temporal horn dilatation, stable from prior. Chronic microvascular disease with ischemic gliosis in the periventricular white matter. Remote small vessel infarct in the peripheral right cerebellum. Vascular: Atherosclerotic calcification.  No hyperdense vessel. Skull: No acute or aggressive finding. Sinuses/Orbits: No acute finding.  Cataract resection on the left. Other: These results were called by telephone at the time of interpretation on 04/18/2016 at 3:35 pm to Dr. Samuel Jester , who verbally acknowledged these results. ASPECTS [facility] Stroke Program Early CT Score) - Ganglionic level infarction (caudate, lentiform nuclei, internal capsule, insula, M1-M3 cortex): 7 - Supraganglionic infarction (M4-M6 cortex): 3 Total score (0-10 with 10 being normal): 10 IMPRESSION: 1. No acute finding.ASPECTS is 10. 2. Chronic microvascular disease. Small remote vessel infarct in the right cerebellum. Electronically Signed   By: Marnee Spring M.D.   On: 04/18/2016 15:37    Time Spent in minutes  30   SINGH,PRASHANT K M.D on 04/19/2016 at 11:23 AM  Between 7am to 7pm - Pager - [phone]  After 7pm go to [email or web address] - password [facility]  [facility] -  Office  [phone]

## 2016-04-20 DIAGNOSIS — R4781 Slurred speech: Secondary | ICD-10-CM | POA: Diagnosis not present

## 2016-04-20 LAB — LIPID PANEL
CHOLESTEROL: 202 mg/dL — AB (ref 0–200)
HDL: 45 mg/dL (ref >40)
LDL Cholesterol: 131 mg/dL — ABNORMAL HIGH (ref 0–99)
TRIGLYCERIDES: 130 mg/dL (ref <150)
Total CHOL/HDL Ratio: 4.5 RATIO
VLDL: 26 mg/dL (ref 0–40)

## 2016-04-20 LAB — C-REACTIVE PROTEIN

## 2016-04-20 LAB — HEMOGLOBIN A1C
Hgb A1c MFr Bld: 5.4 % (ref 4.8–5.6)
MEAN PLASMA GLUCOSE: 108 mg/dL

## 2016-04-20 MED ORDER — ASPIRIN EC 81 MG PO TBEC: 81.0000 mg | DELAYED_RELEASE_TABLET | Freq: Two times a day (BID) | ORAL | Status: DC

## 2016-04-20 NOTE — Discharge Summary (Signed)
Sheryl Carter Doro JIR:[id number] DOB: [birth year] DOA: 04/18/2016  PCP: Glo Herring., MD  Admit date: 04/18/2016  Discharge date: 04/20/2016  Admitted From: Home   Disposition:  Home   Recommendations for Outpatient Follow-up:   Follow up with PCP in 1-2 weeks  PCP Please obtain BMP/CBC, 2 view CXR in 1week,  (see Discharge instructions)   PCP Please follow up on the following pending results: CRP results, monitor lipid panel and myalgias from statin    Home Health: None  Equipment/Devices: None  Consultations: Neuro Discharge Condition: Stable   CODE STATUS: Full   Diet Recommendation:  Heart Healthy     Chief Complaint  Patient presents with  . Weakness     Brief history of present illness from the day of admission and additional interim summary    Sheryl Sohail Southardis a 76 y.o.femalewith hx of HTN, glaucoma, and anxiety presented with slurred speech and blurred vision starting today at approx 2pm. Came to ED. Head CT here showed no acute changes. 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 She was admitted for TIA workup.                                                     Hospital issues addressed     1.TIA. Symptoms completely resolved. MRI nonacute, CT angiogram head and neck shows ICA narrowing measuring 60% on the left and less than 50% on the right, Stable echocardiogram, A1c, LDL above goal kindly see #3 below, she is on aspirin which was increased from daily to twice a day per neurology recommendation, statin held due to myalgias per neurology recommendation, cleared by PT, no speech or swallowing impediments and does not require OT or speech evaluation. Will be discharged home per neuro recommendations with outpatient neuro follow-up.   note ESR was checked which was unremarkable.  CRP levels ordered by neurology are pending PCP to monitor.  2. HTN - on lisinopril continue.  3. Dyslipidemia. Still your LDL was still over 1:30. She did have some myalgias since the statin was started in the outpatient setting few days ago, per neurology discontinue statin for a month then they will readdress.  4. History of vitamin B-12 deficiency. On replacement. Stable B-12 level.  5. GERD. On PPI.  6. Carotid artery disease. Follow-up with vascular surgery outpatient, aspirin twice a day, statin held per neuro recommendation see #3 above.  Discharge diagnosis     Principal Problem:   Slurred speech Active Problems:   Hypertension, essential   Hyperlipidemia   Chronic anxiety    Discharge instructions    Discharge Instructions    Diet - low sodium heart healthy    Complete by:  As directed    Discharge instructions    Complete by:  As directed    Follow with Primary MD Glo Herring., MD in 7 days  Get CBC, CMP, 2 view Chest X ray checked  by Primary MD or SNF MD in 5-7 days ( we routinely change or add medications that can affect your baseline labs and fluid status, therefore we recommend that you get the mentioned basic workup next visit with your PCP, your PCP may decide not to get them or add new tests based on their clinical decision)   Activity: As tolerated with Full fall precautions use walker/cane & assistance as needed   Disposition Home   Diet:   Heart Healthy   For Heart failure patients - Check your Weight same time everyday, if you gain over 2 pounds, or you develop in leg swelling, experience more shortness of breath or chest pain, call your Primary MD immediately. Follow Cardiac Low Salt Diet and 1.5 lit/day fluid restriction.   On your next visit with your primary care physician please Get Medicines reviewed and adjusted.   Please request your Prim.MD to go over all Hospital Tests and Procedure/Radiological results at the follow up,  please get all Hospital records sent to your Prim MD by signing hospital release before you go home.   If you experience worsening of your admission symptoms, develop shortness of breath, life threatening emergency, suicidal or homicidal thoughts you must seek medical attention immediately by calling 911 or calling your MD immediately  if symptoms less severe.  You Must read complete instructions/literature along with all the possible adverse reactions/side effects for all the Medicines you take and that have been prescribed to you. Take any new Medicines after you have completely understood and accpet all the possible adverse reactions/side effects.   Do not drive, operate heavy machinery, perform activities at heights, swimming or participation in water activities or provide baby sitting services if your were admitted for syncope or siezures until you have seen by Primary MD or a Neurologist and advised to do so again.  Do not drive when taking Pain medications.    Do not take more than prescribed Pain, Sleep and Anxiety Medications  Special Instructions: If you have smoked or chewed Tobacco  in the last 2 yrs please stop smoking, stop any regular Alcohol  and or any Recreational drug use.  Wear Seat belts while driving.   Please note  You were cared for by a hospitalist during your hospital stay. If you have any questions about your discharge medications or the care you received while you were in the hospital after you are discharged, you can call the unit and asked to speak with the hospitalist on call if the hospitalist that took care of you is not available. Once you are discharged, your primary care physician will handle any further medical issues. Please note that NO REFILLS for any discharge medications will be authorized once you are discharged, as it is imperative that you return to your primary care physician (or establish a relationship with a primary care physician if you do not  have one) for your aftercare needs so that they can reassess your need for medications and monitor your lab values.   Increase activity slowly    Complete by:  As directed       Discharge Medications   Allergies as of 04/20/2016      Reactions   Erythromycin    Reaction is unknown   Other Hives   Mycins-    Prednisone    Altered mental status      Medication List    STOP taking these medications  pravastatin 20 MG tablet Commonly known as:  PRAVACHOL     TAKE these medications   ALPRAZolam 0.25 MG tablet Commonly known as:  XANAX Take 0.25 mg by mouth 3 (three) times daily.   aspirin EC 81 MG tablet Take 1 tablet (81 mg total) by mouth 2 (two) times daily. What changed:  when to take this   lisinopril-hydrochlorothiazide 10-12.5 MG tablet Commonly known as:  PRINZIDE,ZESTORETIC Take 1 tablet by mouth daily.   NEXIUM 24HR 20 MG capsule Generic drug:  esomeprazole Take 20 mg by mouth daily at 12 noon.   ondansetron 4 MG disintegrating tablet Commonly known as:  ZOFRAN ODT Take 1 tablet (4 mg total) by mouth every 8 (eight) hours as needed for nausea or vomiting.   timolol 0.5 % ophthalmic solution Commonly known as:  BETIMOL Place 1 drop into both eyes 2 (two) times daily.   vitamin B-12 1000 MCG tablet Commonly known as:  CYANOCOBALAMIN Take 1,000 mcg by mouth daily.       Follow-up Information    Glo Herring., MD. Schedule an appointment as soon as possible for a visit in 1 week(s).   Specialty:  Internal Medicine Contact information: [address] [place] [address] [phone]        Curt Jews, MD. Schedule an appointment as soon as possible for a visit in 1 week(s).   Specialties:  Vascular Surgery, Cardiology Contact information: [place] [address] [phone]        [facility], Trey Sailors, MD. Schedule an appointment as soon as possible for a visit in 1 week(s).   Specialty:  Neurology Contact  information: [address] [place] [address] [phone]           Major procedures and Radiology Reports - PLEASE review detailed and final reports thoroughly  -     TTE  - Mild LVH with LVEF 60-65%. Indeterminate diastolic function. Mildly calcified mitral regurgitation. Trivial tricuspid regurgitation. Unable to assess PASP. No obvious PFO or ASD.   Ct Angio Head W Or Wo Contrast  Result Date: 04/19/2016 CLINICAL DATA:  Posterior headache radiating to the right ear for 1 day. Dizziness and blurred vision. Slurred speech starting yesterday. EXAM: CT ANGIOGRAPHY HEAD AND NECK TECHNIQUE: Multidetector CT imaging of the head and neck was performed using the standard protocol during bolus administration of intravenous contrast. Multiplanar CT image reconstructions and MIPs were obtained to evaluate the vascular anatomy. Carotid stenosis measurements (when applicable) are obtained utilizing NASCET criteria, using the distal internal carotid diameter as the denominator. CONTRAST:  100 cc Isovue 370 intravenous COMPARISON:  MRA of the head neck from yesterday. FINDINGS: CTA NECK FINDINGS Aortic arch: 2 vessel branching. No notable atherosclerosis. Negative for stenosis Right carotid system:Mixed calcified and noncalcified atherosclerotic plaque at the common carotid bifurcation and carotid bulb with stenosis measuring up to 40%. No ulceration or dissection noted. Left carotid system: Mixed calcified and noncalcified plaque along the carotid bulb with narrowing measuring up to 60%. No ulceration or dissection noted. Vertebral arteries: No brachiocephalic or proximal subclavian stenosis. The dominant left vertebral artery is tortuous but smooth and widely patent to the dura. Non dominant right vertebral artery is occluded just beyond the origin with reconstitution at the level of the V2 segment and continuing to the dura. Where opacified, no luminal irregularity suspicious for acute dissection.  Skeleton: No acute or aggressive finding Other neck: No incidentally detected mass or adenopathy. Upper chest: Coronary atherosclerotic calcification. Airway thickening. Review  of the MIP images confirms the above findings CTA HEAD FINDINGS Anterior circulation: Symmetric carotid arteries. Atherosclerotic calcification on the siphons with mild supraclinoid ICA narrowing bilaterally. No major branch occlusion. No beading or aneurysm. Posterior circulation: Strongly dominant left vertebral artery. Visible right vertebral artery ends in PICA. Based on conventional brain MRI from yesterday, there is an occluded distal right V4 segment. Between AICAs and superior cerebellar arteries these is a focal moderate to advanced basilar narrowing that is smooth and atherosclerotic appearing. Moderate left P3 segment stenosis. Venous sinuses: Patent Anatomic variants: Fetal type PCA on the left. Delayed phase: No parenchymal enhancement or mass. Review of the MIP images confirms the above findings IMPRESSION: 1. No acute finding or change from yesterday. 2. 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 No luminal irregularity in the opacified vessel strongly concerning for acute dissection in this patient with posterior headache. 3. 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 Lack of acute infarcts favors this to be chronic. 4. Cervical carotid atherosclerosis with proximal ICA narrowing measuring ~60% on the left and less than 50% on the right. 5. 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 Electronically Signed   By: Monte Fantasia M.D.   On: 04/19/2016 10:51   Ct Head Wo Contrast  Result Date: 04/08/2016 CLINICAL DATA:  Chronic dizziness. Right-sided headache and posterior head pain. Nausea. Initial encounter. EXAM: CT HEAD WITHOUT CONTRAST TECHNIQUE: Contiguous axial images were obtained from the base of the skull through the vertex without intravenous contrast. COMPARISON:  CT of the head  performed 11/14/2007 FINDINGS: Brain: No evidence of acute infarction, hemorrhage, hydrocephalus, extra-axial collection or mass lesion/mass effect. Prominence of the ventricles and sulci reflects mild to moderate cortical volume loss. Mild cerebellar atrophy is noted. Scattered periventricular and subcortical white matter change likely reflects small vessel ischemic microangiopathy. 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 The brainstem and fourth ventricle are within normal limits. The basal ganglia are unremarkable in appearance. The cerebral hemispheres demonstrate grossly normal gray-white differentiation. No mass effect or midline shift is seen. Vascular: No hyperdense vessel or unexpected calcification. Skull: There is no evidence of fracture; visualized osseous structures are unremarkable in appearance. Sinuses/Orbits: The orbits are within normal limits. The paranasal sinuses and mastoid air cells are well-aerated. Other: No significant soft tissue abnormalities are seen. IMPRESSION: 1. No acute intracranial pathology seen on CT. 2. Mild to moderate cortical volume loss and scattered small vessel ischemic microangiopathy. 3. 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 Electronically Signed   By: Garald Balding M.D.   On: 04/08/2016 01:19   Ct Angio Neck W Or Wo Contrast  Result Date: 04/19/2016 CLINICAL DATA:  Posterior headache radiating to the right ear for 1 day. Dizziness and blurred vision. Slurred speech starting yesterday. EXAM: CT ANGIOGRAPHY HEAD AND NECK TECHNIQUE: Multidetector CT imaging of the head and neck was performed using the standard protocol during bolus administration of intravenous contrast. Multiplanar CT image reconstructions and MIPs were obtained to evaluate the vascular anatomy. Carotid stenosis measurements (when applicable) are obtained utilizing NASCET criteria, using the distal internal carotid diameter as the denominator.  CONTRAST:  100 cc Isovue 370 intravenous COMPARISON:  MRA of the head neck from yesterday. FINDINGS: CTA NECK FINDINGS Aortic arch: 2 vessel branching. No notable atherosclerosis. Negative for stenosis Right carotid system:Mixed calcified and noncalcified atherosclerotic plaque at the common carotid bifurcation and carotid bulb with stenosis measuring up to 40%. No ulceration or dissection noted. Left  carotid system: Mixed calcified and noncalcified plaque along the carotid bulb with narrowing measuring up to 60%. No ulceration or dissection noted. Vertebral arteries: No brachiocephalic or proximal subclavian stenosis. The dominant left vertebral artery is tortuous but smooth and widely patent to the dura. Non dominant right vertebral artery is occluded just beyond the origin with reconstitution at the level of the V2 segment and continuing to the dura. Where opacified, no luminal irregularity suspicious for acute dissection. Skeleton: No acute or aggressive finding Other neck: No incidentally detected mass or adenopathy. Upper chest: Coronary atherosclerotic calcification. Airway thickening. Review of the MIP images confirms the above findings CTA HEAD FINDINGS Anterior circulation: Symmetric carotid arteries. Atherosclerotic calcification on the siphons with mild supraclinoid ICA narrowing bilaterally. No major branch occlusion. No beading or aneurysm. Posterior circulation: Strongly dominant left vertebral artery. Visible right vertebral artery ends in PICA. Based on conventional brain MRI from yesterday, there is an occluded distal right V4 segment. Between AICAs and superior cerebellar arteries these is a focal moderate to advanced basilar narrowing that is smooth and atherosclerotic appearing. Moderate left P3 segment stenosis. Venous sinuses: Patent Anatomic variants: Fetal type PCA on the left. Delayed phase: No parenchymal enhancement or mass. Review of the MIP images confirms the above findings IMPRESSION:  1. No acute finding or change from yesterday. 2. 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 No luminal irregularity in the opacified vessel strongly concerning for acute dissection in this patient with posterior headache. 3. 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 Lack of acute infarcts favors this to be chronic. 4. Cervical carotid atherosclerosis with proximal ICA narrowing measuring ~60% on the left and less than 50% on the right. 5. 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 Electronically Signed   By: Monte Fantasia M.D.   On: 04/19/2016 10:51   Mr Jodene Nam Neck Wo Contrast  Result Date: 04/18/2016 CLINICAL DATA:  Slurred speech. History of hypertension and hyperlipidemia. EXAM: MRA HEAD WITHOUT CONTRAST MRA NECK WITHOUT CONTRAST TECHNIQUE: Angiographic images of the Circle of Willis were obtained using MRA technique without intravenous contrast. 3D time-of-flight angiographic images of the neck were obtained without intravenous contrast. Carotid stenosis measurements (when applicable) are obtained utilizing NASCET criteria, using the distal internal carotid diameter as the denominator. COMPARISON:  MRI head April 18, 2016 at 1622 hours FINDINGS: MRA NECK FINDINGS ANTERIOR CIRCULATION: The common carotid arteries are widely patent bilaterally. Two vessel arch is a normal variant. The carotid bifurcation is patent bilaterally and there is no hemodynamically significant carotid stenosis by NASCET criteria. Less than than 50% stenosis LEFT internal carotid artery origin. No evidence for atherosclerosis or flow limiting stenosis of the cervical internal carotid arteries. POSTERIOR CIRCULATION: Loss of RIGHT vertebral artery flow related enhancement from the origin, trace RIGHT V2 segment reconstitution. MRA HEAD FINDINGS ANTERIOR CIRCULATION: Normal flow related enhancement of the included cervical, petrous, cavernous and supraclinoid internal carotid arteries. Patent anterior  communicating artery. Normal flow related enhancement of the anterior and middle cerebral arteries, including distal segments. No large vessel occlusion, high-grade stenosis, abnormal luminal irregularity, aneurysm. POSTERIOR CIRCULATION: Tiny RIGHT posterior-inferior cerebellar artery present, no definite intervertebral RIGHT vertebral artery. Patent basilar artery and main branch vessels. Moderate stenosis LEFT superior cerebral artery origin. Bilateral posterior cerebral arteries are patent. Mild stenosis proximal LEFT P3 segment compatible with atherosclerosis. ANATOMIC VARIANTS: Fetal origin LEFT posterior cerebral artery. Source images and MIP image were reviewed. IMPRESSION: MRA neck: Occluded RIGHT vertebral artery from the origin distally, thready  incomplete V2 segment reconstitution. LEFT than 50% stenosis LEFT internal carotid artery origin. MRA head: Nonvisualized RIGHT V4 segment, however patent RIGHT PICA. Moderate stenosis LEFT SCA origin. Electronically Signed   By: Elon Alas M.D.   On: 04/18/2016 19:07   [name] (neuro Protocol)  Result Date: 04/18/2016 CLINICAL DATA:  Slurred speech. EXAM: MRI HEAD WITHOUT CONTRAST TECHNIQUE: Multiplanar, multiecho pulse sequences of the brain and surrounding structures were obtained without intravenous contrast. COMPARISON:  Head CT 04/18/2016 FINDINGS: Brain: There is no evidence of acute infarct, intracranial hemorrhage, mass, midline shift, or extra-axial fluid collection. Mild-to-moderate enlargement of the lateral and third ventricles and sylvian fissures favored to reflect cerebral atrophy. There is a small chronic infarct in the posterior right cerebellar hemisphere. Patchy T2 hyperintensities in the subcortical and deep cerebral white matter bilaterally are nonspecific but compatible with mild-to-moderate chronic small vessel ischemic disease. Vascular: Major intracranial vascular flow voids are preserved, with the left vertebral  artery dominant. Skull and upper cervical spine: Unremarkable bone marrow signal. Sinuses/Orbits: Prior left cataract extraction. Small mastoid effusions. Clear paranasal sinuses. Other: None. IMPRESSION: 1. No acute intracranial abnormality. 2. Chronic right cerebellar infarct. 3. Mild-to-moderate chronic small vessel ischemic disease and cerebral atrophy. Electronically Signed   By: Logan Bores M.D.   On: 04/18/2016 16:50   Mr Jodene Nam Head (cerebral Arteries)  Result Date: 04/18/2016 CLINICAL DATA:  Slurred speech. History of hypertension and hyperlipidemia. EXAM: MRA HEAD WITHOUT CONTRAST MRA NECK WITHOUT CONTRAST TECHNIQUE: Angiographic images of the Circle of Willis were obtained using MRA technique without intravenous contrast. 3D time-of-flight angiographic images of the neck were obtained without intravenous contrast. Carotid stenosis measurements (when applicable) are obtained utilizing NASCET criteria, using the distal internal carotid diameter as the denominator. COMPARISON:  MRI head April 18, 2016 at 1622 hours FINDINGS: MRA NECK FINDINGS ANTERIOR CIRCULATION: The common carotid arteries are widely patent bilaterally. Two vessel arch is a normal variant. The carotid bifurcation is patent bilaterally and there is no hemodynamically significant carotid stenosis by NASCET criteria. Less than than 50% stenosis LEFT internal carotid artery origin. No evidence for atherosclerosis or flow limiting stenosis of the cervical internal carotid arteries. POSTERIOR CIRCULATION: Loss of RIGHT vertebral artery flow related enhancement from the origin, trace RIGHT V2 segment reconstitution. MRA HEAD FINDINGS ANTERIOR CIRCULATION: Normal flow related enhancement of the included cervical, petrous, cavernous and supraclinoid internal carotid arteries. Patent anterior communicating artery. Normal flow related enhancement of the anterior and middle cerebral arteries, including distal segments. No large vessel occlusion,  high-grade stenosis, abnormal luminal irregularity, aneurysm. POSTERIOR CIRCULATION: Tiny RIGHT posterior-inferior cerebellar artery present, no definite intervertebral RIGHT vertebral artery. Patent basilar artery and main branch vessels. Moderate stenosis LEFT superior cerebral artery origin. Bilateral posterior cerebral arteries are patent. Mild stenosis proximal LEFT P3 segment compatible with atherosclerosis. ANATOMIC VARIANTS: Fetal origin LEFT posterior cerebral artery. Source images and MIP image were reviewed. IMPRESSION: MRA neck: Occluded RIGHT vertebral artery from the origin distally, thready incomplete V2 segment reconstitution. LEFT than 50% stenosis LEFT internal carotid artery origin. MRA head: Nonvisualized RIGHT V4 segment, however patent RIGHT PICA. Moderate stenosis LEFT SCA origin. Electronically Signed   By: Elon Alas M.D.   On: 04/18/2016 19:07   Ct Head Code Stroke W/o Cm  Result Date: 04/18/2016 CLINICAL DATA:  Code stroke.  Slurred speech EXAM: CT HEAD WITHOUT CONTRAST TECHNIQUE: Contiguous axial images were obtained from the base of the skull through the vertex without intravenous contrast. COMPARISON:  04/08/2016  FINDINGS: Brain: No evidence of acute infarction, hemorrhage, hydrocephalus, extra-axial collection or mass lesion/mass effect. Lateral and third ventriculomegaly favored secondary to volume loss given lack of temporal horn dilatation, stable from prior. Chronic microvascular disease with ischemic gliosis in the periventricular white matter. Remote small vessel infarct in the peripheral right cerebellum. Vascular: Atherosclerotic calcification.  No hyperdense vessel. Skull: No acute or aggressive finding. Sinuses/Orbits: No acute finding.  Cataract resection on the left. Other: These results were called by telephone at the time of interpretation on 04/18/2016 at 3:35 pm to Dr. Francine Graven , who verbally acknowledged these results. ASPECTS [facility] Stroke Program  Early CT Score) - Ganglionic level infarction (caudate, lentiform nuclei, internal capsule, insula, M1-M3 cortex): 7 - Supraganglionic infarction (M4-M6 cortex): 3 Total score (0-10 with 10 being normal): 10 IMPRESSION: 1. No acute finding.ASPECTS is 10. 2. Chronic microvascular disease. Small remote vessel infarct in the right cerebellum. Electronically Signed   By: Monte Fantasia M.D.   On: 04/18/2016 15:37    Micro Results    No results found for this or any previous visit (from the past 240 hour(s)).  Today   Subjective    Amariana Mirando today has no headache,no chest abdominal pain,no new weakness tingling or numbness, feels much better wants to go home today.    Objective   Blood pressure (!) 101/59, pulse 74, temperature 98 F (36.7 C), temperature source Oral, resp. rate 18, height '4\' 11"'  (1.499 m), weight 84.6 kg (186 lb 6.4 oz), SpO2 97 %.   Intake/Output Summary (Last 24 hours) at 04/20/16 0857 Last data filed at 04/19/16 1700  Gross per 24 hour  Intake              600 ml  Output                0 ml  Net              600 ml    Exam Awake Alert, Oriented x 3, No new F.N deficits, Normal affect [place].AT,PERRAL Supple Neck,No JVD, No cervical lymphadenopathy appriciated.  Symmetrical Chest wall movement, Good air movement bilaterally, CTAB RRR,No Gallops,Rubs or new Murmurs, No Parasternal Heave +ve B.Sounds, Abd Soft, Non tender, No organomegaly appriciated, No rebound -guarding or rigidity. No Cyanosis, Clubbing or edema, No new Rash or bruise   Data Review   CBC w Diff: Lab Results  Component Value Date   WBC 10.0 04/18/2016   HGB 15.3 (H) 04/18/2016   HCT 45.0 04/18/2016   PLT 273 04/18/2016   LYMPHOPCT 37 04/18/2016   MONOPCT 5 04/18/2016   EOSPCT 1 04/18/2016   BASOPCT 0 04/18/2016    CMP: Lab Results  Component Value Date   NA 138 04/18/2016   K 3.5 04/18/2016   CL 102 04/18/2016   CO2 28 04/18/2016   BUN 18 04/18/2016   CREATININE 0.90  04/18/2016   PROT 7.5 04/18/2016   ALBUMIN 4.1 04/18/2016   BILITOT 0.9 04/18/2016   ALKPHOS 66 04/18/2016   AST 25 04/18/2016   ALT 16 04/18/2016  . Lab Results  Component Value Date   CHOL 202 (H) 04/20/2016   HDL 45 04/20/2016   LDLCALC 131 (H) 04/20/2016   TRIG 130 04/20/2016   CHOLHDL 4.5 04/20/2016   Lab Results  Component Value Date   HGBA1C 5.4 04/19/2016     Total Time in preparing paper work, data evaluation and todays exam - 35 minutes  Lala Lund K M.D on 04/20/2016 at  8:[place]  [phone]

## 2016-04-20 NOTE — Progress Notes (Signed)
Patient with orders to be discharge home. Discharge instructions given, patient and daughter verbalized understanding. Patient stable. Patient left in private vehicle with daughter.  

## 2016-04-24 DIAGNOSIS — E782 Mixed hyperlipidemia: Secondary | ICD-10-CM | POA: Diagnosis not present

## 2016-04-24 DIAGNOSIS — R159 Full incontinence of feces: Secondary | ICD-10-CM | POA: Diagnosis not present

## 2016-04-24 DIAGNOSIS — Z6839 Body mass index (BMI) 39.0-39.9, adult: Secondary | ICD-10-CM | POA: Diagnosis not present

## 2016-04-24 DIAGNOSIS — G459 Transient cerebral ischemic attack, unspecified: Secondary | ICD-10-CM | POA: Diagnosis not present

## 2016-04-24 DIAGNOSIS — E669 Obesity, unspecified: Secondary | ICD-10-CM | POA: Diagnosis not present

## 2016-04-24 DIAGNOSIS — K529 Noninfective gastroenteritis and colitis, unspecified: Secondary | ICD-10-CM | POA: Diagnosis not present

## 2016-05-03 DIAGNOSIS — I739 Peripheral vascular disease, unspecified: Secondary | ICD-10-CM | POA: Diagnosis not present

## 2016-05-03 DIAGNOSIS — F349 Persistent mood [affective] disorder, unspecified: Secondary | ICD-10-CM | POA: Diagnosis not present

## 2016-05-03 DIAGNOSIS — R42 Dizziness and giddiness: Secondary | ICD-10-CM | POA: Diagnosis not present

## 2016-05-03 DIAGNOSIS — E782 Mixed hyperlipidemia: Secondary | ICD-10-CM | POA: Diagnosis not present

## 2016-05-03 DIAGNOSIS — G43C1 Periodic headache syndromes in child or adult, intractable: Secondary | ICD-10-CM | POA: Diagnosis not present

## 2016-05-03 DIAGNOSIS — I10 Essential (primary) hypertension: Secondary | ICD-10-CM | POA: Diagnosis not present

## 2016-05-08 ENCOUNTER — Encounter: Payer: Self-pay | Admitting: Vascular Surgery

## 2016-05-16 ENCOUNTER — Encounter: Payer: Self-pay | Admitting: Vascular Surgery

## 2016-05-16 ENCOUNTER — Other Ambulatory Visit: Payer: Self-pay

## 2016-05-16 ENCOUNTER — Ambulatory Visit (INDEPENDENT_AMBULATORY_CARE_PROVIDER_SITE_OTHER): Payer: Medicare HMO | Admitting: Vascular Surgery

## 2016-05-16 ENCOUNTER — Encounter: Payer: Medicare HMO | Admitting: Vascular Surgery

## 2016-05-16 VITALS — BP 134/77 | HR 61 | Temp 97.4°F | Resp 18 | Ht 59.0 in | Wt 185.8 lb

## 2016-05-16 DIAGNOSIS — R42 Dizziness and giddiness: Secondary | ICD-10-CM | POA: Diagnosis not present

## 2016-05-16 DIAGNOSIS — I6522 Occlusion and stenosis of left carotid artery: Secondary | ICD-10-CM | POA: Diagnosis not present

## 2016-05-16 NOTE — Progress Notes (Signed)
HISTORY AND PHYSICAL     CC:  Blocked carotid artery Referring Provider:  Fusco, Lawrence, MD  HPI: This is a 76 y.o. female who says she got sick with flu/sinusitis around Thanksgiving and was unable to get in to see her doctor.  She states she presented to [facility] in January with c/o being dizzy, light-headed and nauseated and was diagnosed with vertigo and discharged.  About a week later, she presented back to the ER with c/o dizziness, blurry vision in the right eye (blind in the left eye), nauseated and some slurred speech.  Her slurred speech resolved after about an hour.  She underwent CT of the head and MRI and was found to have a narrowing of the left carotid artery.  She is referred to Dr. Shaydon Lease for possible intervention.  She denies having unilateral weakness or paralysis.  She states that she was started on a statin and did not tolerate this as it made her legs heavy and felt like she couldn't move.  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  She does have glaucoma in the right eye as well. Her vision became blurry but she never lost vision temporarily.  She is right handed.    She does have hypertension and is on an ACEI for this.  She takes a daily aspirin.  She does have chronic anxiety especially around medical issues.   She uses eye gtts for her glaucoma.  She denies any hx of chest pain or hx of MI.    She has never smoked or consumed alcohol.    Past Medical History:  Diagnosis Date  . Anxiety   . Diabetes mellitus without complication (HCC)   . DVT (deep venous thrombosis) (HCC)   . Ear problems   . Glaucoma   . Hypertension   . Stroke (HCC)     Past Surgical History:  Procedure Laterality Date  . CHOLECYSTECTOMY    . EYE SURGERY      Allergies  Allergen Reactions  . Erythromycin     Reaction is unknown  . Other Hives    Mycins-   . Prednisone     Altered mental status    Current Outpatient Prescriptions  Medication Sig Dispense  Refill  . ALPRAZolam (XANAX) 0.25 MG tablet Take 0.25 mg by mouth 3 (three) times daily.    . aspirin EC 81 MG tablet Take 1 tablet (81 mg total) by mouth 2 (two) times daily.    . esomeprazole (NEXIUM 24HR) 20 MG capsule Take 20 mg by mouth daily at 12 noon.    . lisinopril-hydrochlorothiazide (PRINZIDE,ZESTORETIC) 10-12.5 MG tablet Take 1 tablet by mouth daily.    . ondansetron (ZOFRAN ODT) 4 MG disintegrating tablet Take 1 tablet (4 mg total) by mouth every 8 (eight) hours as needed for nausea or vomiting. 10 tablet 0  . timolol (BETIMOL) 0.5 % ophthalmic solution Place 1 drop into both eyes 2 (two) times daily.    . vitamin B-12 (CYANOCOBALAMIN) 1000 MCG tablet Take 1,000 mcg by mouth daily.     No current facility-administered medications for this visit.     Family History  Problem Relation Age of Onset  . Heart disease Sister   . Heart disease Son     Social History   Social History  . Marital status: Divorced    Spouse name: N/A  . Number of children: N/A  . Years of education: N/A   Occupational History  . Not   on file.   Social History Main Topics  . Smoking status: Never Smoker  . Smokeless tobacco: Never Used  . Alcohol use No  . Drug use: No  . Sexual activity: Not on file   Other Topics Concern  . Not on file   Social History Narrative  . No narrative on file     REVIEW OF SYSTEMS:   [X] denotes positive finding, [ ] denotes negative finding Cardiac  Comments:  Chest pain or chest pressure:    Shortness of breath upon exertion: x   Short of breath when lying flat:    Irregular heart rhythm:        Vascular    Pain in calf, thigh, or hip brought on by ambulation:    Pain in feet at night that wakes you up from your sleep:     Blood clot in your veins:    Leg swelling:         Pulmonary    Oxygen at home:    Productive cough:     Wheezing:         Neurologic    Sudden weakness in arms or legs:  x See HPI  Sudden numbness in arms or legs:  x    Sudden onset of difficulty speaking or slurred speech: x   Temporary loss of vision in one eye:  x No loss of vision-only blurry vision   Problems with dizziness:  x       Gastrointestinal    Blood in stool:     Vomited blood:         Genitourinary    Burning when urinating:     Blood in urine:        Psychiatric    Major depression:  x Especially when sick      Hematologic    Bleeding problems:    Problems with blood clotting too easily:        Skin    Rashes or ulcers:        Constitutional    Fever or chills:      PHYSICAL EXAMINATION:  Vitals:   05/16/16 1440 05/16/16 1443  BP: (!) 147/85 134/77  Pulse: 61   Resp: 18   Temp: 97.4 F (36.3 C)    Body mass index is 37.53 kg/m.  General:  WDWN in NAD; vital signs documented above Gait: Not observed HENT: WNL, normocephalic Pulmonary: normal non-labored breathing , without Rales, rhonchi,  wheezing Cardiac: regular HR, without  Murmurs, rubs or gallops; without carotid bruits Abdomen: soft, NT, no masses Skin: without rashes Vascular Exam/Pulses:  Right Left  Radial 2+ (normal) 2+ (normal)  Ulnar Unable to palpate  Unable to palpate   DP 2+ (normal) 2+ (normal)  PT Unable to palpate  Unable to palpate    Extremities: without ischemic changes, without Gangrene , without cellulitis; without open wounds;  Musculoskeletal: no muscle wasting or atrophy  Neurologic: A&O X 3;  No focal weakness or paresthesias are detected Psychiatric:  The pt has Normal affect and a little anxious   Non-Invasive Vascular Imaging:  CTA head/neck 04/19/16: IMPRESSION: 1. No acute finding or change from yesterday. 2. 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 No luminal irregularity in the opacified vessel strongly concerning for acute dissection in this patient with posterior headache. 3. 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 Lack of acute infarcts favors this to be chronic. 4. Cervical carotid  atherosclerosis with proximal ICA narrowing measuring ~  60% on the left and less than 50% on the right. 5. 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 No acute intracranial pathology seen on CT. 2. Mild to moderate cortical volume loss and scattered small vessel ischemic microangiopathy. 3. 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  LEFT than 50% stenosis LEFT internal carotid artery origin.  MRA head: Nonvisualized RIGHT V4 segment, however patent RIGHT PICA.  Moderate stenosis LEFT SCA origin.  MRI brain 04/18/16 IMPRESSION: 1. No acute intracranial abnormality. 2. Chronic right cerebellar infarct. 3. Mild-to-moderate chronic small vessel ischemic disease and cerebral atrophy.  2D echocardiogram 04/19/16: Study Conclusions  - Left ventricle: The cavity size was normal. Wall thickness was   increased in a pattern of mild LVH. Systolic function was normal.   The estimated ejection fraction was in the range of 60% to 65%.   Wall motion was normal; there were no regional wall motion   abnormalities. The study is not technically sufficient to allow   evaluation of LV diastolic function. - Aortic valve: Mildly calcified annulus. Trileaflet. - Mitral valve: Calcified annulus. - Atrial septum: No defect or patent foramen ovale was identified. - Tricuspid valve: There was trivial regurgitation. - Pulmonary arteries: Systolic pressure could not be accurately   estimated. - Pericardium, extracardiac: A prominent pericardial fat pad was   present.  Impressions:  - Mild LVH with LVEF 60-65%. Indeterminate diastolic function.   Mildly calcified mitral regurgitation. Trivial tricuspid   regurgitation. Unable to assess PASP. No obvious PFO or ASD.   Pt meds  includes: Statin:  No. Beta Blocker:  No. Aspirin:  Yes.   ACEI:  Yes.   ARB:  No. CCB use:  No Other Antiplatelet/Anticoagulant:  No   ASSESSMENT/PLAN:: 76 y.o. female with symptomatic left carotid artery stenosis   -pt with recent TIA with slurred speech that has since resolved.  She did not have any hemiparesis or temporary blindness.  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  She does have around 60% stenosis of the left carotid artery on MRA, which could be the source of her TIA given there is no other source or reason for the slurred speech.   -given there is no other explanation for this, will proceed with left carotid endarterectomy in the next couple of weeks.  Dr. Lace Chenevert discussed risks vs benefit with the pt and her daughter.  The pt wants to proceed to avoid large stroke.   -she did have a 2D echo, which revealed an EF of 60-65%.  She has no hx of cardiac problems and has not had any chest pain.     Samantha Rhyne, PA-C [facility] [phone]  Clinic MD:  Pt seen and examined with Dr. Isaiah Cianci  I have examined the patient, reviewed and agree with above. Long discussion with the patient and daughter present. She's had this several episodes of transient ischemic attacks. CT angiogram suggest at least 60% stenosis in her left internal carotid artery. She has a relatively low bifurcation with a very focal narrowing. I explained to the patient and her daughter is no way of knowing for sure that this caused her event. I explained the option of carotid endarterectomy to reduce her risk for stroke if this was the symptomatic calls. 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 Discussed the procedure including 1-1-1/2 risk of stroke with surgery and possible cranial nerve injury which be quite unusual. She understands and wished to proceed with surgery. We will schedule this at her earliest  Idaly Verret, MD 05/16/2016 4:05 PM    

## 2016-05-24 ENCOUNTER — Encounter (HOSPITAL_COMMUNITY)
Admission: RE | Admit: 2016-05-24 | Discharge: 2016-05-24 | Disposition: A | Discharge status: Home / Self Care | Payer: Medicare HMO | Source: Ambulatory Visit | Attending: Vascular Surgery | Admitting: Vascular Surgery

## 2016-05-24 ENCOUNTER — Encounter (HOSPITAL_COMMUNITY): Payer: Self-pay

## 2016-05-24 DIAGNOSIS — Z01812 Encounter for preprocedural laboratory examination: Secondary | ICD-10-CM | POA: Insufficient documentation

## 2016-05-24 DIAGNOSIS — I10 Essential (primary) hypertension: Secondary | ICD-10-CM | POA: Insufficient documentation

## 2016-05-24 DIAGNOSIS — I6522 Occlusion and stenosis of left carotid artery: Secondary | ICD-10-CM | POA: Insufficient documentation

## 2016-05-24 HISTORY — DX: Adverse effect of unspecified anesthetic, initial encounter: T41.45XA

## 2016-05-24 HISTORY — DX: Family history of other specified conditions: Z84.89

## 2016-05-24 HISTORY — DX: Other complications of anesthesia, initial encounter: T88.59XA

## 2016-05-24 HISTORY — DX: Prediabetes: R73.03

## 2016-05-24 HISTORY — DX: Blindness, one eye, unspecified eye: H54.40

## 2016-05-24 LAB — CBC
HEMATOCRIT: 43.3 % (ref 36.0–46.0)
HEMOGLOBIN: 14.8 g/dL (ref 12.0–15.0)
MCH: 31.2 pg (ref 26.0–34.0)
MCHC: 34.2 g/dL (ref 30.0–36.0)
MCV: 91.4 fL (ref 78.0–100.0)
Platelets: 247 10*3/uL (ref 150–400)
RBC: 4.74 MIL/uL (ref 3.87–5.11)
RDW: 12.7 % (ref 11.5–15.5)
WBC: 8.5 10*3/uL (ref 4.0–10.5)

## 2016-05-24 LAB — COMPREHENSIVE METABOLIC PANEL
ALBUMIN: 3.9 g/dL (ref 3.5–5.0)
ALK PHOS: 72 U/L (ref 38–126)
ALT: 16 U/L (ref 14–54)
AST: 25 U/L (ref 15–41)
Anion gap: 8 (ref 5–15)
BILIRUBIN TOTAL: 1.1 mg/dL (ref 0.3–1.2)
BUN: 12 mg/dL (ref 6–20)
CALCIUM: 9.7 mg/dL (ref 8.9–10.3)
CO2: 28 mmol/L (ref 22–32)
Chloride: 103 mmol/L (ref 101–111)
Creatinine, Ser: 0.78 mg/dL (ref 0.44–1.00)
GFR calc Af Amer: >60 mL/min (ref >60)
GLUCOSE: 106 mg/dL — AB (ref 65–99)
POTASSIUM: 3.5 mmol/L (ref 3.5–5.1)
Sodium: 139 mmol/L (ref 135–145)
TOTAL PROTEIN: 7.1 g/dL (ref 6.5–8.1)

## 2016-05-24 LAB — GLUCOSE, CAPILLARY: Glucose-Capillary: 88 mg/dL (ref 65–99)

## 2016-05-24 LAB — URINALYSIS, ROUTINE W REFLEX MICROSCOPIC
Bilirubin Urine: NEGATIVE
Glucose, UA: NEGATIVE mg/dL
Hgb urine dipstick: NEGATIVE
Ketones, ur: NEGATIVE mg/dL
NITRITE: POSITIVE — AB
Protein, ur: NEGATIVE mg/dL
SPECIFIC GRAVITY, URINE: 1.02 (ref 1.005–1.030)
pH: 5.5 (ref 5.0–8.0)

## 2016-05-24 LAB — PROTIME-INR
INR: 0.99
Prothrombin Time: 13.1 seconds (ref 11.4–15.2)

## 2016-05-24 LAB — URINALYSIS, MICROSCOPIC (REFLEX)

## 2016-05-24 LAB — TYPE AND SCREEN
ABO/RH(D): B NEG
Antibody Screen: NEGATIVE

## 2016-05-24 LAB — APTT: APTT: 32 s (ref 24–36)

## 2016-05-24 LAB — SURGICAL PCR SCREEN
MRSA, PCR: NEGATIVE
Staphylococcus aureus: POSITIVE — AB

## 2016-05-24 LAB — ABO/RH: ABO/RH(D): B NEG

## 2016-05-24 NOTE — Progress Notes (Signed)
Pt. Denies having any cardiac needs in the past, followed by Dr. Sherwood GamblerFusco for PCP.  [facility] for slurring speech & "feeling like I was having a stroke". At that visit an ECHO was completed & she was referred for carotid surgery. Pt. Denies all chest concerns today. Pt. Uses a cane strictly because of L eye blindness, she denies joint & back or balance problems.

## 2016-05-24 NOTE — Pre-Procedure Instructions (Signed)
Sheryl Carter  05/24/2016      [facility] - [place], [place] - [address] [place] [id number] Phone: [phone] Fax: [phone]    Your procedure is scheduled on 05/29/2016  [facility] Admitting at 5:30 A.M.  Call this number if you have problems the morning of surgery:  [phone]   Remember:   Do not eat food or drink liquids after midnight.  On Sunday night    Take these medicines the morning of surgery with A SIP OF WATER : ASPIRIN, Xanax, use eye drops    Do not wear jewelry, make-up or nail polish.   Do not wear lotions, powders, or perfumes,  or deoderant.   Do not shave 48 hours prior to surgery.     Do not bring valuables to the hospital.   [facility] is not responsible for any belongings or valuables.  Contacts, dentures or bridgework may not be worn into surgery.  Leave your suitcase in the car.  After surgery it may be brought to your room.  For patients admitted to the hospital, discharge time will be determined by your treatment team.  Patients discharged the day of surgery will not be allowed to drive home.   Name and phone number of your driver:   With family  Special instructions:  Special Instructions: [facility] - Preparing for Surgery  Before surgery, you can play an important role.  Because skin is not sterile, your skin needs to be as free of germs as possible.  You can reduce the number of germs on you skin by washing with CHG (chlorahexidine gluconate) soap before surgery.  CHG is an antiseptic cleaner which kills germs and bonds with the skin to continue killing germs even after washing.  Please DO NOT use if you have an allergy to CHG or antibacterial soaps.  If your skin becomes reddened/irritated stop using the CHG and inform your nurse when you arrive at Short Stay.  Do not shave (including legs and underarms) for at least 48 hours prior to the first CHG shower.  You may shave  your face.  Please follow these instructions carefully:   1.  Shower with CHG Soap the night before surgery and the  morning of Surgery.  2.  If you choose to wash your hair, wash your hair first as usual with your  normal shampoo.  3.  After you shampoo, rinse your hair and body thoroughly to remove the  Shampoo.  4.  Use CHG as you would any other liquid soap.  You can apply chg directly to the skin and wash gently with scrungie or a clean washcloth.  5.  Apply the CHG Soap to your body ONLY FROM THE NECK DOWN.    Do not use on open wounds or open sores.  Avoid contact with your eyes, ears, mouth and genitals (private parts).  Wash genitals (private parts)   with your normal soap.  6.  Wash thoroughly, paying special attention to the area where your surgery will be performed.  7.  Thoroughly rinse your body with warm water from the neck down.  8.  DO NOT shower/wash with your normal soap after using and rinsing off   the CHG Soap.  9.  Pat yourself dry with a clean towel.            10 .  Wear clean pajamas.  11.  Place clean sheets on your bed the night of your first shower and do not sleep with pets.  Day of Surgery  Do not apply any lotions/deodorants the morning of surgery.  Please wear clean clothes to the hospital/surgery center.  Please read over the following fact sheets that you were given. Pain Booklet, Coughing and Deep Breathing, MRSA Information and Surgical Site Infection Prevention

## 2016-05-25 NOTE — Progress Notes (Signed)
Mupirocin Ointment Rx called into [facility] in [place] for positive PCR of Staph. Left message on pt's voicemail informing her of result and need to pick up Rx.

## 2016-05-28 NOTE — Anesthesia Preprocedure Evaluation (Addendum)
Anesthesia Evaluation  Patient identified by MRN, date of birth, ID band Patient awake    Reviewed: Allergy & Precautions, H&P , NPO status , Patient's Chart, lab work & pertinent test results  Airway Mallampati: III  TM Distance: >3 FB Neck ROM: Full    Dental no notable dental hx. (+) Edentulous Upper, Edentulous Lower, Dental Advisory Given   Pulmonary neg pulmonary ROS,    Pulmonary exam normal breath sounds clear to auscultation       Cardiovascular Exercise Tolerance: Good hypertension, Pt. on medications + Peripheral Vascular Disease  negative cardio ROS   Rhythm:Regular Rate:Normal     Neuro/Psych Anxiety CVA negative neurological ROS  negative psych ROS   GI/Hepatic negative GI ROS, Neg liver ROS,   Endo/Other  negative endocrine ROSMorbid obesity  Renal/GU negative Renal ROS  negative genitourinary   Musculoskeletal   Abdominal   Peds  Hematology negative hematology ROS (+)   Anesthesia Other Findings   Reproductive/Obstetrics negative OB ROS                            Anesthesia Physical Anesthesia Plan  ASA: III  Anesthesia Plan: General   Post-op Pain Management:    Induction: Intravenous  Airway Management Planned: Oral ETT  Additional Equipment: Arterial line  Intra-op Plan:   Post-operative Plan: Extubation in OR and Possible Post-op intubation/ventilation  Informed Consent: I have reviewed the patients History and Physical, chart, labs and discussed the procedure including the risks, benefits and alternatives for the proposed anesthesia with the patient or authorized representative who has indicated his/her understanding and acceptance.   Dental advisory given  Plan Discussed with: CRNA  Anesthesia Plan Comments:         Anesthesia Quick Evaluation

## 2016-05-29 ENCOUNTER — Inpatient Hospital Stay (HOSPITAL_COMMUNITY)
Admission: RE | Admit: 2016-05-29 | Discharge: 2016-05-30 | DRG: 039 | Disposition: A | Discharge status: Home / Self Care | Payer: Medicare HMO | Source: Ambulatory Visit | Attending: Vascular Surgery | Admitting: Vascular Surgery

## 2016-05-29 ENCOUNTER — Telehealth: Payer: Self-pay | Admitting: Vascular Surgery

## 2016-05-29 ENCOUNTER — Inpatient Hospital Stay (HOSPITAL_COMMUNITY): Payer: Medicare HMO | Admitting: Anesthesiology

## 2016-05-29 ENCOUNTER — Encounter (HOSPITAL_COMMUNITY): Payer: Self-pay

## 2016-05-29 ENCOUNTER — Encounter (HOSPITAL_COMMUNITY)
Admission: RE | Disposition: A | Discharge status: Home / Self Care | Payer: Self-pay | Source: Ambulatory Visit | Attending: Vascular Surgery

## 2016-05-29 DIAGNOSIS — Z79899 Other long term (current) drug therapy: Secondary | ICD-10-CM

## 2016-05-29 DIAGNOSIS — Z8673 Personal history of transient ischemic attack (TIA), and cerebral infarction without residual deficits: Secondary | ICD-10-CM

## 2016-05-29 DIAGNOSIS — Z8249 Family history of ischemic heart disease and other diseases of the circulatory system: Secondary | ICD-10-CM | POA: Diagnosis not present

## 2016-05-29 DIAGNOSIS — Z86718 Personal history of other venous thrombosis and embolism: Secondary | ICD-10-CM | POA: Diagnosis not present

## 2016-05-29 DIAGNOSIS — F419 Anxiety disorder, unspecified: Secondary | ICD-10-CM | POA: Diagnosis not present

## 2016-05-29 DIAGNOSIS — Z9889 Other specified postprocedural states: Secondary | ICD-10-CM | POA: Diagnosis not present

## 2016-05-29 DIAGNOSIS — H409 Unspecified glaucoma: Secondary | ICD-10-CM | POA: Diagnosis present

## 2016-05-29 DIAGNOSIS — I10 Essential (primary) hypertension: Secondary | ICD-10-CM | POA: Diagnosis present

## 2016-05-29 DIAGNOSIS — I6522 Occlusion and stenosis of left carotid artery: Secondary | ICD-10-CM | POA: Diagnosis not present

## 2016-05-29 DIAGNOSIS — Z9049 Acquired absence of other specified parts of digestive tract: Secondary | ICD-10-CM

## 2016-05-29 DIAGNOSIS — H5462 Unqualified visual loss, left eye, normal vision right eye: Secondary | ICD-10-CM | POA: Diagnosis present

## 2016-05-29 DIAGNOSIS — E785 Hyperlipidemia, unspecified: Secondary | ICD-10-CM | POA: Diagnosis not present

## 2016-05-29 DIAGNOSIS — Z66 Do not resuscitate: Secondary | ICD-10-CM | POA: Diagnosis present

## 2016-05-29 DIAGNOSIS — I6529 Occlusion and stenosis of unspecified carotid artery: Secondary | ICD-10-CM | POA: Diagnosis present

## 2016-05-29 DIAGNOSIS — E119 Type 2 diabetes mellitus without complications: Secondary | ICD-10-CM | POA: Diagnosis present

## 2016-05-29 HISTORY — PX: ENDARTERECTOMY: SHX5162

## 2016-05-29 LAB — GLUCOSE, CAPILLARY: GLUCOSE-CAPILLARY: 108 mg/dL — AB (ref 65–99)

## 2016-05-29 SURGERY — ENDARTERECTOMY, CAROTID
Anesthesia: General | Site: Neck | Laterality: Left

## 2016-05-29 MED ORDER — FENTANYL CITRATE (PF) 100 MCG/2ML IJ SOLN
INTRAMUSCULAR | Status: AC
  Filled 2016-05-29: qty 2

## 2016-05-29 MED ORDER — LABETALOL HCL 5 MG/ML IV SOLN: 10.0000 mg | INTRAVENOUS | Status: DC | PRN

## 2016-05-29 MED ORDER — BISACODYL 10 MG RE SUPP: 10.0000 mg | Freq: Every day | RECTAL | Status: DC | PRN

## 2016-05-29 MED ORDER — ROCURONIUM BROMIDE 100 MG/10ML IV SOLN
INTRAVENOUS | Status: DC | PRN
  Administered 2016-05-29: 50 mg via INTRAVENOUS

## 2016-05-29 MED ORDER — ONDANSETRON HCL 4 MG/2ML IJ SOLN: 4.0000 mg | Freq: Four times a day (QID) | INTRAMUSCULAR | Status: DC | PRN

## 2016-05-29 MED ORDER — PROTAMINE SULFATE 10 MG/ML IV SOLN
INTRAVENOUS | Status: DC | PRN
  Administered 2016-05-29: 50 mg via INTRAVENOUS

## 2016-05-29 MED ORDER — HYDROCHLOROTHIAZIDE 12.5 MG PO CAPS
12.5000 mg | ORAL_CAPSULE | Freq: Every day | ORAL | Status: DC
  Administered 2016-05-29: 12.5 mg via ORAL
  Filled 2016-05-29: qty 1

## 2016-05-29 MED ORDER — SODIUM CHLORIDE 0.9 % IV SOLN: 500.0000 mL | Freq: Once | INTRAVENOUS | Status: DC | PRN

## 2016-05-29 MED ORDER — LACTATED RINGERS IV SOLN
INTRAVENOUS | Status: DC | PRN
  Administered 2016-05-29 (×2): via INTRAVENOUS

## 2016-05-29 MED ORDER — ACETAMINOPHEN 325 MG PO TABS
ORAL_TABLET | ORAL | Status: AC
  Filled 2016-05-29: qty 2

## 2016-05-29 MED ORDER — MIDAZOLAM HCL 2 MG/2ML IJ SOLN
INTRAMUSCULAR | Status: AC
  Filled 2016-05-29: qty 2

## 2016-05-29 MED ORDER — ALPRAZOLAM 0.25 MG PO TABS
0.1250 mg | ORAL_TABLET | Freq: Two times a day (BID) | ORAL | Status: DC
  Administered 2016-05-29 – 2016-05-30 (×2): 0.125 mg via ORAL
  Filled 2016-05-29 (×2): qty 1

## 2016-05-29 MED ORDER — LIDOCAINE HCL (CARDIAC) 20 MG/ML IV SOLN
INTRAVENOUS | Status: DC | PRN
  Administered 2016-05-29: 60 mg via INTRAVENOUS

## 2016-05-29 MED ORDER — SUGAMMADEX SODIUM 200 MG/2ML IV SOLN
INTRAVENOUS | Status: DC | PRN
  Administered 2016-05-29: 170 mg via INTRAVENOUS

## 2016-05-29 MED ORDER — DOCUSATE SODIUM 100 MG PO CAPS
100.0000 mg | ORAL_CAPSULE | Freq: Every day | ORAL | Status: DC
  Administered 2016-05-30: 100 mg via ORAL
  Filled 2016-05-29: qty 1

## 2016-05-29 MED ORDER — POLYETHYLENE GLYCOL 3350 17 G PO PACK: 17.0000 g | PACK | Freq: Every day | ORAL | Status: DC | PRN

## 2016-05-29 MED ORDER — SODIUM CHLORIDE 0.9 % IV SOLN: INTRAVENOUS | Status: DC

## 2016-05-29 MED ORDER — MORPHINE SULFATE (PF) 2 MG/ML IV SOLN: 2.0000 mg | INTRAVENOUS | Status: DC | PRN

## 2016-05-29 MED ORDER — ROCURONIUM BROMIDE 50 MG/5ML IV SOSY
PREFILLED_SYRINGE | INTRAVENOUS | Status: AC
  Filled 2016-05-29: qty 5

## 2016-05-29 MED ORDER — ACETAMINOPHEN 325 MG PO TABS: 325.0000 mg | ORAL_TABLET | ORAL | Status: DC | PRN

## 2016-05-29 MED ORDER — CHLORHEXIDINE GLUCONATE CLOTH 2 % EX PADS: 6.0000 | MEDICATED_PAD | Freq: Once | CUTANEOUS | Status: DC

## 2016-05-29 MED ORDER — 0.9 % SODIUM CHLORIDE (POUR BTL) OPTIME
TOPICAL | Status: DC | PRN
Start: 2016-05-29 — End: 2016-05-29
  Administered 2016-05-29: 1000 mL

## 2016-05-29 MED ORDER — CEFUROXIME SODIUM 1.5 G IJ SOLR
1.5000 g | INTRAMUSCULAR | Status: AC
  Administered 2016-05-29: 1.5 g via INTRAVENOUS

## 2016-05-29 MED ORDER — HEPARIN SODIUM (PORCINE) 1000 UNIT/ML IJ SOLN
INTRAMUSCULAR | Status: DC | PRN
  Administered 2016-05-29: 8000 [IU] via INTRAVENOUS

## 2016-05-29 MED ORDER — ONDANSETRON HCL 4 MG/2ML IJ SOLN
INTRAMUSCULAR | Status: DC | PRN
  Administered 2016-05-29: 4 mg via INTRAVENOUS

## 2016-05-29 MED ORDER — PROMETHAZINE HCL 25 MG/ML IJ SOLN
6.2500 mg | Freq: Once | INTRAMUSCULAR | Status: AC
  Administered 2016-05-29: 6.25 mg via INTRAVENOUS

## 2016-05-29 MED ORDER — ASPIRIN EC 81 MG PO TBEC: 81.0000 mg | DELAYED_RELEASE_TABLET | Freq: Two times a day (BID) | ORAL | Status: DC

## 2016-05-29 MED ORDER — MAGNESIUM SULFATE 2 GM/50ML IV SOLN: 2.0000 g | Freq: Every day | INTRAVENOUS | Status: DC | PRN

## 2016-05-29 MED ORDER — EPHEDRINE SULFATE 50 MG/ML IJ SOLN
INTRAMUSCULAR | Status: DC | PRN
  Administered 2016-05-29 (×2): 5 mg via INTRAVENOUS
  Administered 2016-05-29: 2.5 mg via INTRAVENOUS
  Administered 2016-05-29: 15 mg via INTRAVENOUS

## 2016-05-29 MED ORDER — NAPROXEN 250 MG PO TABS
250.0000 mg | ORAL_TABLET | Freq: Two times a day (BID) | ORAL | Status: DC
  Administered 2016-05-30: 500 mg via ORAL
  Filled 2016-05-29: qty 2

## 2016-05-29 MED ORDER — HEPARIN SODIUM (PORCINE) 1000 UNIT/ML IJ SOLN
INTRAMUSCULAR | Status: AC
  Filled 2016-05-29: qty 2

## 2016-05-29 MED ORDER — PANTOPRAZOLE SODIUM 40 MG PO TBEC
40.0000 mg | DELAYED_RELEASE_TABLET | Freq: Every day | ORAL | Status: DC
  Administered 2016-05-29 – 2016-05-30 (×2): 40 mg via ORAL
  Filled 2016-05-29 (×2): qty 1

## 2016-05-29 MED ORDER — PROTAMINE SULFATE 10 MG/ML IV SOLN
INTRAVENOUS | Status: AC
  Filled 2016-05-29: qty 5

## 2016-05-29 MED ORDER — SODIUM CHLORIDE 0.9 % IV SOLN
INTRAVENOUS | Status: DC
  Administered 2016-05-29: 22:00:00 via INTRAVENOUS

## 2016-05-29 MED ORDER — LIDOCAINE HCL (PF) 1 % IJ SOLN
INTRAMUSCULAR | Status: AC
  Filled 2016-05-29: qty 30

## 2016-05-29 MED ORDER — LISINOPRIL 10 MG PO TABS
10.0000 mg | ORAL_TABLET | Freq: Every day | ORAL | Status: DC
  Administered 2016-05-29: 10 mg via ORAL
  Filled 2016-05-29: qty 1

## 2016-05-29 MED ORDER — SODIUM CHLORIDE 0.9 % IV SOLN
INTRAVENOUS | Status: DC | PRN
  Administered 2016-05-29: 07:00:00

## 2016-05-29 MED ORDER — NAPROXEN SODIUM 275 MG PO TABS
550.0000 mg | ORAL_TABLET | Freq: Every day | ORAL | Status: DC | PRN
  Administered 2016-05-29: 550 mg via ORAL
  Filled 2016-05-29: qty 1
  Filled 2016-05-29 (×2): qty 2

## 2016-05-29 MED ORDER — EPHEDRINE 5 MG/ML INJ
INTRAVENOUS | Status: AC
  Filled 2016-05-29: qty 10

## 2016-05-29 MED ORDER — FENTANYL CITRATE (PF) 100 MCG/2ML IJ SOLN
25.0000 ug | INTRAMUSCULAR | Status: DC | PRN
  Administered 2016-05-29: 25 ug via INTRAVENOUS

## 2016-05-29 MED ORDER — PROPOFOL 10 MG/ML IV BOLUS
INTRAVENOUS | Status: AC
  Filled 2016-05-29: qty 20

## 2016-05-29 MED ORDER — PHENOL 1.4 % MT LIQD
1.0000 | OROMUCOSAL | Status: DC | PRN
  Administered 2016-05-30: 1 via OROMUCOSAL
  Filled 2016-05-29: qty 177

## 2016-05-29 MED ORDER — METOPROLOL TARTRATE 5 MG/5ML IV SOLN
2.0000 mg | INTRAVENOUS | Status: DC | PRN
Start: 2016-05-29 — End: 2016-05-30

## 2016-05-29 MED ORDER — SODIUM CHLORIDE 0.9 % IV SOLN
0.0125 ug/kg/min | INTRAVENOUS | Status: DC
  Administered 2016-05-29: .1 ug/kg/min via INTRAVENOUS
  Filled 2016-05-29 (×3): qty 2000

## 2016-05-29 MED ORDER — LISINOPRIL-HYDROCHLOROTHIAZIDE 10-12.5 MG PO TABS: 1.0000 | ORAL_TABLET | Freq: Every day | ORAL | Status: DC

## 2016-05-29 MED ORDER — PHENYLEPHRINE 40 MCG/ML (10ML) SYRINGE FOR IV PUSH (FOR BLOOD PRESSURE SUPPORT)
PREFILLED_SYRINGE | INTRAVENOUS | Status: AC
  Filled 2016-05-29: qty 10

## 2016-05-29 MED ORDER — LIDOCAINE 2% (20 MG/ML) 5 ML SYRINGE
INTRAMUSCULAR | Status: AC
  Filled 2016-05-29: qty 5

## 2016-05-29 MED ORDER — POTASSIUM CHLORIDE CRYS ER 20 MEQ PO TBCR: 20.0000 meq | EXTENDED_RELEASE_TABLET | Freq: Every day | ORAL | Status: DC | PRN

## 2016-05-29 MED ORDER — GUAIFENESIN-DM 100-10 MG/5ML PO SYRP: 15.0000 mL | ORAL_SOLUTION | ORAL | Status: DC | PRN

## 2016-05-29 MED ORDER — ACETAMINOPHEN 650 MG RE SUPP: 325.0000 mg | RECTAL | Status: DC | PRN

## 2016-05-29 MED ORDER — MIDAZOLAM HCL 5 MG/5ML IJ SOLN
INTRAMUSCULAR | Status: DC | PRN
  Administered 2016-05-29: 1 mg via INTRAVENOUS

## 2016-05-29 MED ORDER — TIMOLOL HEMIHYDRATE 0.5 % OP SOLN
1.0000 [drp] | Freq: Two times a day (BID) | OPHTHALMIC | Status: DC
  Administered 2016-05-29 – 2016-05-30 (×2): 1 [drp] via OPHTHALMIC
  Filled 2016-05-29: qty 5

## 2016-05-29 MED ORDER — FENTANYL CITRATE (PF) 100 MCG/2ML IJ SOLN
INTRAMUSCULAR | Status: DC | PRN
  Administered 2016-05-29 (×2): 50 ug via INTRAVENOUS

## 2016-05-29 MED ORDER — PROPOFOL 10 MG/ML IV BOLUS
INTRAVENOUS | Status: DC | PRN
Start: 2016-05-29 — End: 2016-05-29
  Administered 2016-05-29: 80 mg via INTRAVENOUS

## 2016-05-29 MED ORDER — GLYCOPYRROLATE 0.2 MG/ML IJ SOLN
INTRAMUSCULAR | Status: DC | PRN
  Administered 2016-05-29: 0.2 mg via INTRAVENOUS

## 2016-05-29 MED ORDER — ALUM & MAG HYDROXIDE-SIMETH 200-200-20 MG/5ML PO SUSP: 15.0000 mL | ORAL | Status: DC | PRN

## 2016-05-29 MED ORDER — HYDRALAZINE HCL 20 MG/ML IJ SOLN: 5.0000 mg | INTRAMUSCULAR | Status: DC | PRN

## 2016-05-29 MED ORDER — DEXTROSE 5 % IV SOLN
INTRAVENOUS | Status: AC
  Filled 2016-05-29: qty 1.5

## 2016-05-29 MED ORDER — OXYCODONE-ACETAMINOPHEN 5-325 MG PO TABS: 1.0000 | ORAL_TABLET | Freq: Four times a day (QID) | ORAL | Status: DC | PRN

## 2016-05-29 MED ORDER — SUGAMMADEX SODIUM 200 MG/2ML IV SOLN
INTRAVENOUS | Status: AC
  Filled 2016-05-29: qty 2

## 2016-05-29 MED ORDER — PHENYLEPHRINE HCL 10 MG/ML IJ SOLN
INTRAVENOUS | Status: DC | PRN
  Administered 2016-05-29: 30 ug/min via INTRAVENOUS

## 2016-05-29 MED ORDER — PROMETHAZINE HCL 25 MG/ML IJ SOLN
INTRAMUSCULAR | Status: AC
  Filled 2016-05-29: qty 1

## 2016-05-29 MED ORDER — CEFUROXIME SODIUM 1.5 G IJ SOLR
1.5000 g | Freq: Two times a day (BID) | INTRAMUSCULAR | Status: AC
  Administered 2016-05-29 – 2016-05-30 (×2): 1.5 g via INTRAVENOUS
  Filled 2016-05-29 (×2): qty 1.5

## 2016-05-29 MED ORDER — VITAMIN B-12 1000 MCG PO TABS
1000.0000 ug | ORAL_TABLET | Freq: Every day | ORAL | Status: DC
  Administered 2016-05-29: 1000 ug via ORAL
  Filled 2016-05-29: qty 1

## 2016-05-29 MED ORDER — OXYCODONE-ACETAMINOPHEN 5-325 MG PO TABS: 1.0000 | ORAL_TABLET | Freq: Four times a day (QID) | ORAL | 0 refills | Status: DC | PRN

## 2016-05-29 SURGICAL SUPPLY — 35 items
ADH SKN CLS APL DERMABOND .7 (GAUZE/BANDAGES/DRESSINGS) ×1
CANISTER SUCT 3000ML PPV (MISCELLANEOUS) ×3 IMPLANT
CANNULA VESSEL 3MM 2 BLNT TIP (CANNULA) ×6 IMPLANT
CATH ROBINSON RED A/P 18FR (CATHETERS) ×3 IMPLANT
CLIP LIGATING EXTRA MED SLVR (CLIP) ×3 IMPLANT
CLIP LIGATING EXTRA SM BLUE (MISCELLANEOUS) ×3 IMPLANT
CRADLE DONUT ADULT HEAD (MISCELLANEOUS) ×3 IMPLANT
DERMABOND ADVANCED (GAUZE/BANDAGES/DRESSINGS) ×2
DERMABOND ADVANCED .7 DNX12 (GAUZE/BANDAGES/DRESSINGS) ×1 IMPLANT
ELECT REM PT RETURN 9FT ADLT (ELECTROSURGICAL) ×3
ELECTRODE REM PT RTRN 9FT ADLT (ELECTROSURGICAL) ×1 IMPLANT
GLOVE BIO SURGEON STRL SZ 6.5 (GLOVE) ×3 IMPLANT
GLOVE BIO SURGEON STRL SZ7 (GLOVE) ×2 IMPLANT
GLOVE BIO SURGEONS STRL SZ 6.5 (GLOVE) ×3
GLOVE BIOGEL PI IND STRL 6.5 (GLOVE) IMPLANT
GLOVE BIOGEL PI IND STRL 7.0 (GLOVE) IMPLANT
GLOVE BIOGEL PI INDICATOR 6.5 (GLOVE) ×2
GLOVE BIOGEL PI INDICATOR 7.0 (GLOVE) ×2
GLOVE SS BIOGEL STRL SZ 7.5 (GLOVE) ×1 IMPLANT
GLOVE SUPERSENSE BIOGEL SZ 7.5 (GLOVE) ×2
GLOVE SURG SS PI 6.5 STRL IVOR (GLOVE) ×2 IMPLANT
GOWN STRL REUS W/ TWL LRG LVL3 (GOWN DISPOSABLE) ×3 IMPLANT
GOWN STRL REUS W/TWL LRG LVL3 (GOWN DISPOSABLE) ×9
KIT BASIN OR (CUSTOM PROCEDURE TRAY) ×3 IMPLANT
KIT ROOM TURNOVER OR (KITS) ×3 IMPLANT
NS IRRIG 1000ML POUR BTL (IV SOLUTION) ×6 IMPLANT
PACK CAROTID (CUSTOM PROCEDURE TRAY) ×3 IMPLANT
PAD ARMBOARD 7.5X6 YLW CONV (MISCELLANEOUS) ×6 IMPLANT
PATCH HEMASHIELD 8X75 (Vascular Products) ×2 IMPLANT
SHUNT CAROTID BYPASS 10 (VASCULAR PRODUCTS) ×2 IMPLANT
SUT PROLENE 6 0 CC (SUTURE) ×11 IMPLANT
SUT VIC AB 3-0 SH 27 (SUTURE) ×6
SUT VIC AB 3-0 SH 27X BRD (SUTURE) ×2 IMPLANT
SUT VICRYL 4-0 PS2 18IN ABS (SUTURE) ×2 IMPLANT
WATER STERILE IRR 1000ML POUR (IV SOLUTION) ×3 IMPLANT

## 2016-05-29 NOTE — Anesthesia Postprocedure Evaluation (Signed)
Anesthesia Post Note  Patient: Loleta ChanceShirley M Fobes  Procedure(s) Performed: Procedure(s) (LRB): LEFT CAROTID ENDARTERECTOMY WITH PATCH ANGIOPLASTY (Left)  Patient location during evaluation: PACU Anesthesia Type: General Level of consciousness: awake and alert Pain management: pain level controlled Vital Signs Assessment: post-procedure vital signs reviewed and stable Respiratory status: spontaneous breathing, nonlabored ventilation and respiratory function stable Cardiovascular status: blood pressure returned to baseline and stable Postop Assessment: no signs of nausea or vomiting Anesthetic complications: no       Last Vitals:  Vitals:   05/29/16 1300 05/29/16 1315  BP: (!) 117/53   Pulse: (!) 49 (!) 55  Resp: (!) 9 12  Temp:      Last Pain:  Vitals:   05/29/16 1255  TempSrc:   PainSc: 6                  Tomi Paddock,W. EDMOND

## 2016-05-29 NOTE — Anesthesia Procedure Notes (Signed)
Procedure Name: Intubation Date/Time: 05/29/2016 7:48 AM Performed by: Rosiland OzMEYERS, Dewey Viens Pre-anesthesia Checklist: Patient identified, Emergency Drugs available, Suction available, Patient being monitored and Timeout performed Patient Re-evaluated:Patient Re-evaluated prior to inductionOxygen Delivery Method: Circle system utilized Preoxygenation: Pre-oxygenation with 100% oxygen Intubation Type: IV induction Ventilation: Mask ventilation without difficulty and Oral airway inserted - appropriate to patient size Laryngoscope Size: Miller and 3 Grade View: Grade I Tube type: Oral Tube size: 7.5 mm Number of attempts: 1 Airway Equipment and Method: Stylet Placement Confirmation: ETT inserted through vocal cords under direct vision,  positive ETCO2 and breath sounds checked- equal and bilateral Secured at: 20 cm Tube secured with: Tape Dental Injury: Teeth and Oropharynx as per pre-operative assessment

## 2016-05-29 NOTE — Interval H&P Note (Signed)
History and Physical Interval Note:  05/29/2016 7:05 AM  Sheryl ChanceShirley M Pentecost  has presented today for surgery, with the diagnosis of Left carotid artery stenosis I65.22  The various methods of treatment have been discussed with the patient and family. After consideration of risks, benefits and other options for treatment, the patient has consented to  Procedure(s): ENDARTERECTOMY CAROTID (Left) as a surgical intervention .  The patient's history has been reviewed, patient examined, no change in status, stable for surgery.  I have reviewed the patient's chart and labs.  Questions were answered to the patient's satisfaction.     Gretta BeganEarly, Shemia Bevel

## 2016-05-29 NOTE — Progress Notes (Signed)
  [facility] Day of Surgery Note  Subjective:  Patient seen in PACU. Complaining of headache and difficulty "focusing" with right eye. Denies blurriness right eye.   Vitals:   05/29/16 1130 05/29/16 1145  BP:    Pulse: (!) 53 (!) 51  Resp: 10 12  Temp:     Left eye blind. Left neck without hematoma No facial asymmetry, no tongue deviation. 5/5 strength upper and lower extremities.   Assessment/Plan:  This is a 76 y.o. female who is s/p left carotid endarterectomy  BP stable.  Having right eye "focusing problem" but no blurriness or loss of vision. Similar to pre-op. Otherwise, neuro exam intact.  Was previously full DNR at last hospitalization in January. Will need to clarify.    Maris BergerKimberly Kamarah Bilotta, [place] JerseyPA-C Pager: [phone] 05/29/2016 12:05 PM

## 2016-05-29 NOTE — Op Note (Signed)
    OPERATIVE REPORT  DATE OF SURGERY: 05/29/2016  PATIENT: Sheryl Carter, 76 y.o. female MRN: [id number]  DOB: 11/08/1940  PRE-OPERATIVE DIAGNOSIS: Symptomatic left internal carotid artery stenosis  POST-OPERATIVE DIAGNOSIS:  Same  PROCEDURE: Left carotid endarterectomy and Dacron patch angioplasty  SURGEON:  Gretta Beganodd Early, M.D.  PHYSICIAN ASSISTANT: Samantha Rhyne PA-C  ANESTHESIA:  Gen.  EBL: Minimal ml  Total I/O In: 1100 [I.V.:1100] Out: 50 [Blood:50]  BLOOD ADMINISTERED: None  DRAINS: None  SPECIMEN: None  COUNTS CORRECT:  YES  PLAN OF CARE: PACU neurologically intact   PATIENT DISPOSITION:  PACU - hemodynamically stable  PROCEDURE DETAILS: The patient was taken to the operative placed supine position where the area of the left  Jamie sterile fashion. Incision was made anterior to the sternocleidomastoid and carried down through the platysma with cautery. Sternal cleidomastoid reflect posteriorly and the carotid sheath was opened. The vagus and hypoglossal nerves were identified and preserved. The common carotid artery was encircled with an umbilical tape and Rummel tourniquet. Dissection was conducted on to the bifurcation and the external carotid was interactive was encircled with a blue vessel loop. The superior thyroid artery was encircled with a 2-0 silk Potts tie and the internal and common carotid arteries were encircled with umbilical tape and Rummel tourniquet. The patient was given 8000 units intravenous heparin and after adequate circulation time the internal/external and common carotid arteries were occluded. The common carotid artery was opened with an 11 blade some long-standing with Potts scissors through the back onto the internal carotid artery. A 10 shunt was passed up the internal carotid without the back bleed and down the common carotid were secured with Rummel tourniquet. The endarterectomy skin on the common carotid artery and the plaque was  divided proximally with Potts scissors. Endarterectomy scheduled bifurcation. The external carotid was endarterectomized with an eversion technique and the internal carotid was endarterectomized in an open fashion. Remaining atheromatous debris was removed from the endarterectomy plane. A Finesse Hemashield Dacron patch was brought onto the field and was sewn as a patch angioplasty of a running 6-0 Prolene suture. Prior to completion of the closure the shunt was removed and the usual flushing maneuvers were undertaken. Anastomosis completed and flow was restored to the external and then the internal carotid artery. Excellent flow characteristics were noted with hand-held Doppler in the internal and external carotid arteries. The patient was given 50 mg of protamine to reverse heparin. Wounds irrigated with saline. Hemostasis tablet cautery. The wounds were closed with several 3-0 Vicryl sutures reapproximated sternocleidomastoid over the carotid sheath. Next the platysma was closed running 3-0 Vicryl suture and finally the skin was closed with a 4 0 Vicryl suture. Dermabond was applied the patient was transferred to the PACU neurologically intact and stable condition   Larina Earthlyodd F. Early, M.D., [facility] 05/29/2016 12:29 PM

## 2016-05-29 NOTE — Transfer of Care (Signed)
Immediate Anesthesia Transfer of Care Note  Patient: Sheryl Carter  Procedure(s) Performed: Procedure(s): LEFT CAROTID ENDARTERECTOMY WITH PATCH ANGIOPLASTY (Left)  Patient Location: PACU  Anesthesia Type:General  Level of Consciousness: awake, alert  and patient cooperative  Airway & Oxygen Therapy: Patient Spontanous Breathing and Patient connected to face mask oxygen  Post-op Assessment: Report given to RN and Post -op Vital signs reviewed and stable  Post vital signs: Reviewed and stable  Last Vitals:  Vitals:   05/29/16 0541  BP: 140/75  Pulse: 62  Resp: 18  Temp: 36.4 C    Last Pain:  Vitals:   05/29/16 0541  TempSrc: Oral         Complications: No apparent anesthesia complications

## 2016-05-29 NOTE — Telephone Encounter (Signed)
-----   Message from Sharee PimpleMarilyn K McChesney, RN sent at 05/29/2016  9:40 AM EST ----- Regarding: 2-3 weeks   ----- Message ----- From: Dara LordsSamantha J Rhyne, PA-C Sent: 05/29/2016   9:33 AM To: Vvs Charge Pool  S/p left CEA 05/29/16.  F/u with Dr. Arbie CookeyEarly in 2-3 weeks.  Thanks, Lelon MastSamantha

## 2016-05-29 NOTE — OR Nursing (Signed)
Patient able to squeeze both hands equally, wiggle both toes and stick out tongue with no asymmetry prior to leaving OR suite.

## 2016-05-29 NOTE — Telephone Encounter (Signed)
LVM on home # for PO on 3/27, mailed lttr

## 2016-05-29 NOTE — H&P (View-Only) (Signed)
HISTORY AND PHYSICAL     CC:  Blocked carotid artery Referring Provider:  Elfredia NevinsFusco, Lawrence, MD  HPI: This is a 76 y.o. female who says she got sick with flu/sinusitis around Thanksgiving and was unable to get in to see her doctor.  She states she presented to [facility] in January with c/o being dizzy, light-headed and nauseated and was diagnosed with vertigo and discharged.  About a week later, she presented back to the ER with c/o dizziness, blurry vision in the right eye (blind in the left eye), nauseated and some slurred speech.  Her slurred speech resolved after about an hour.  She underwent CT of the head and MRI and was found to have a narrowing of the left carotid artery.  She is referred to Dr. Arbie CookeyEarly for possible intervention.  She denies having unilateral weakness or paralysis.  She states that she was started on a statin and did not tolerate this as it made her legs heavy and felt like she couldn't move.  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  She does have glaucoma in the right eye as well. Her vision became blurry but she never lost vision temporarily.  She is right handed.    She does have hypertension and is on an ACEI for this.  She takes a daily aspirin.  She does have chronic anxiety especially around medical issues.   She uses eye gtts for her glaucoma.  She denies any hx of chest pain or hx of MI.    She has never smoked or consumed alcohol.    Past Medical History:  Diagnosis Date  . Anxiety   . Diabetes mellitus without complication (HCC)   . DVT (deep venous thrombosis) (HCC)   . Ear problems   . Glaucoma   . Hypertension   . Stroke [facility](HCC)     Past Surgical History:  Procedure Laterality Date  . CHOLECYSTECTOMY    . EYE SURGERY      Allergies  Allergen Reactions  . Erythromycin     Reaction is unknown  . Other Hives    Mycins-   . Prednisone     Altered mental status    Current Outpatient Prescriptions  Medication Sig Dispense  Refill  . ALPRAZolam (XANAX) 0.25 MG tablet Take 0.25 mg by mouth 3 (three) times daily.    Marland Kitchen. aspirin EC 81 MG tablet Take 1 tablet (81 mg total) by mouth 2 (two) times daily.    Marland Kitchen. esomeprazole (NEXIUM 24HR) 20 MG capsule Take 20 mg by mouth daily at 12 noon.    Marland Kitchen. lisinopril-hydrochlorothiazide (PRINZIDE,ZESTORETIC) 10-12.5 MG tablet Take 1 tablet by mouth daily.    . ondansetron (ZOFRAN ODT) 4 MG disintegrating tablet Take 1 tablet (4 mg total) by mouth every 8 (eight) hours as needed for nausea or vomiting. 10 tablet 0  . timolol (BETIMOL) 0.5 % ophthalmic solution Place 1 drop into both eyes 2 (two) times daily.    . vitamin B-12 (CYANOCOBALAMIN) 1000 MCG tablet Take 1,000 mcg by mouth daily.     No current facility-administered medications for this visit.     Family History  Problem Relation Age of Onset  . Heart disease Sister   . Heart disease Son     Social History   Social History  . Marital status: Divorced    Spouse name: N/A  . Number of children: N/A  . Years of education: N/A   Occupational History  . Not  on file.   Social History Main Topics  . Smoking status: Never Smoker  . Smokeless tobacco: Never Used  . Alcohol use No  . Drug use: No  . Sexual activity: Not on file   Other Topics Concern  . Not on file   Social History Narrative  . No narrative on file     REVIEW OF SYSTEMS:   [X]  denotes positive finding, [ ]  denotes negative finding Cardiac  Comments:  Chest pain or chest pressure:    Shortness of breath upon exertion: x   Short of breath when lying flat:    Irregular heart rhythm:        Vascular    Pain in calf, thigh, or hip brought on by ambulation:    Pain in feet at night that wakes you up from your sleep:     Blood clot in your veins:    Leg swelling:         Pulmonary    Oxygen at home:    Productive cough:     Wheezing:         Neurologic    Sudden weakness in arms or legs:  x See HPI  Sudden numbness in arms or legs:  x    Sudden onset of difficulty speaking or slurred speech: x   Temporary loss of vision in one eye:  x No loss of vision-only blurry vision   Problems with dizziness:  x       Gastrointestinal    Blood in stool:     Vomited blood:         Genitourinary    Burning when urinating:     Blood in urine:        Psychiatric    Major depression:  x Especially when sick      Hematologic    Bleeding problems:    Problems with blood clotting too easily:        Skin    Rashes or ulcers:        Constitutional    Fever or chills:      PHYSICAL EXAMINATION:  Vitals:   05/16/16 1440 05/16/16 1443  BP: (!) 147/85 134/77  Pulse: 61   Resp: 18   Temp: 97.4 F (36.3 C)    Body mass index is 37.53 kg/m.  General:  WDWN in NAD; vital signs documented above Gait: Not observed HENT: WNL, normocephalic Pulmonary: normal non-labored breathing , without Rales, rhonchi,  wheezing Cardiac: regular HR, without  Murmurs, rubs or gallops; without carotid bruits Abdomen: soft, NT, no masses Skin: without rashes Vascular Exam/Pulses:  Right Left  Radial 2+ (normal) 2+ (normal)  Ulnar Unable to palpate  Unable to palpate   DP 2+ (normal) 2+ (normal)  PT Unable to palpate  Unable to palpate    Extremities: without ischemic changes, without Gangrene , without cellulitis; without open wounds;  Musculoskeletal: no muscle wasting or atrophy  Neurologic: A&O X 3;  No focal weakness or paresthesias are detected Psychiatric:  The pt has Normal affect and a little anxious   Non-Invasive Vascular Imaging:  CTA head/neck 04/19/16: IMPRESSION: 1. No acute finding or change from yesterday. 2. 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 No luminal irregularity in the opacified vessel strongly concerning for acute dissection in this patient with posterior headache. 3. 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 Lack of acute infarcts favors this to be chronic. 4. Cervical carotid  atherosclerosis with proximal ICA narrowing measuring ~  60% on the left and less than 50% on the right. 5. 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 No acute intracranial pathology seen on CT. 2. Mild to moderate cortical volume loss and scattered small vessel ischemic microangiopathy. 3. 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  LEFT than 50% stenosis LEFT internal carotid artery origin.  MRA head: Nonvisualized RIGHT V4 segment, however patent RIGHT PICA.  Moderate stenosis LEFT SCA origin.  MRI brain 04/18/16 IMPRESSION: 1. No acute intracranial abnormality. 2. Chronic right cerebellar infarct. 3. Mild-to-moderate chronic small vessel ischemic disease and cerebral atrophy.  2D echocardiogram 04/19/16: Study Conclusions  - Left ventricle: The cavity size was normal. Wall thickness was   increased in a pattern of mild LVH. Systolic function was normal.   The estimated ejection fraction was in the range of 60% to 65%.   Wall motion was normal; there were no regional wall motion   abnormalities. The study is not technically sufficient to allow   evaluation of LV diastolic function. - Aortic valve: Mildly calcified annulus. Trileaflet. - Mitral valve: Calcified annulus. - Atrial septum: No defect or patent foramen ovale was identified. - Tricuspid valve: There was trivial regurgitation. - Pulmonary arteries: Systolic pressure could not be accurately   estimated. - Pericardium, extracardiac: A prominent pericardial fat pad was   present.  Impressions:  - Mild LVH with LVEF 60-65%. Indeterminate diastolic function.   Mildly calcified mitral regurgitation. Trivial tricuspid   regurgitation. Unable to assess PASP. No obvious PFO or ASD.   Pt meds  includes: Statin:  No. Beta Blocker:  No. Aspirin:  Yes.   ACEI:  Yes.   ARB:  No. CCB use:  No Other Antiplatelet/Anticoagulant:  No   ASSESSMENT/PLAN:: 76 y.o. female with symptomatic left carotid artery stenosis   -pt with recent TIA with slurred speech that has since resolved.  She did not have any hemiparesis or temporary blindness.  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  She does have around 60% stenosis of the left carotid artery on MRA, which could be the source of her TIA given there is no other source or reason for the slurred speech.   -given there is no other explanation for this, will proceed with left carotid endarterectomy in the next couple of weeks.  Dr. Arbie Cookey discussed risks vs benefit with the pt and her daughter.  The pt wants to proceed to avoid large stroke.   -she did have a 2D echo, which revealed an EF of 60-65%.  She has no hx of cardiac problems and has not had any chest pain.     Doreatha Massed, PA-C [facility] [phone]  Clinic MD:  Pt seen and examined with Dr. Arbie Cookey  I have examined the patient, reviewed and agree with above. Long discussion with the patient and daughter present. She's had this several episodes of transient ischemic attacks. CT angiogram suggest at least 60% stenosis in her left internal carotid artery. She has a relatively low bifurcation with a very focal narrowing. I explained to the patient and her daughter is no way of knowing for sure that this caused her event. I explained the option of carotid endarterectomy to reduce her risk for stroke if this was the symptomatic calls. 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 Discussed the procedure including 1-1-1/2 risk of stroke with surgery and possible cranial nerve injury which be quite unusual. She understands and wished to proceed with surgery. We will schedule this at her earliest  Gretta Began, MD 05/16/2016 4:05 PM

## 2016-05-30 ENCOUNTER — Encounter (HOSPITAL_COMMUNITY): Payer: Self-pay | Admitting: Vascular Surgery

## 2016-05-30 LAB — BASIC METABOLIC PANEL
ANION GAP: 6 (ref 5–15)
BUN: 10 mg/dL (ref 6–20)
CO2: 26 mmol/L (ref 22–32)
Calcium: 8.9 mg/dL (ref 8.9–10.3)
Chloride: 106 mmol/L (ref 101–111)
Creatinine, Ser: 0.81 mg/dL (ref 0.44–1.00)
GFR calc Af Amer: >60 mL/min (ref >60)
Glucose, Bld: 96 mg/dL (ref 65–99)
POTASSIUM: 3.4 mmol/L — AB (ref 3.5–5.1)
SODIUM: 138 mmol/L (ref 135–145)

## 2016-05-30 LAB — CBC
HCT: 31.9 % — ABNORMAL LOW (ref 36.0–46.0)
HEMOGLOBIN: 10.6 g/dL — AB (ref 12.0–15.0)
MCH: 30.4 pg (ref 26.0–34.0)
MCHC: 33.2 g/dL (ref 30.0–36.0)
MCV: 91.4 fL (ref 78.0–100.0)
PLATELETS: 168 10*3/uL (ref 150–400)
RBC: 3.49 MIL/uL — AB (ref 3.87–5.11)
RDW: 12.9 % (ref 11.5–15.5)
WBC: 7.3 10*3/uL (ref 4.0–10.5)

## 2016-05-30 NOTE — Discharge Summary (Signed)
Discharge Summary     Sheryl Carter 01/27/1941 76 y.o. female  [id number]  Admission Date: 05/29/2016  Discharge Date: 05/30/16  Physician: Larina Earthlyodd F Early, MD  Admission Diagnosis: Left carotid artery stenosis I65.22   HPI:   This is a 76 y.o. female who says she got sick with flu/sinusitis around Thanksgiving and was unable to get in to see her doctor.  She states she [facility] in January with c/o being dizzy, light-headed and nauseated and was diagnosed with vertigo and discharged.  About a week later, she presented back to the ER with c/o dizziness, blurry vision in the right eye (blind in the left eye), nauseated and some slurred speech.  Her slurred speech resolved after about an hour.  She underwent CT of the head and MRI and was found to have a narrowing of the left carotid artery.  She is referred to Dr. Arbie CookeyEarly for possible intervention.  She denies having unilateral weakness or paralysis.  She states that she was started on a statin and did not tolerate this as it made her legs heavy and felt like she couldn't move.  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  She does have glaucoma in the right eye as well. Her vision became blurry but she never lost vision temporarily.  She is right handed.    She does have hypertension and is on an ACEI for this.  She takes a daily aspirin.  She does have chronic anxiety especially around medical issues.   She uses eye gtts for her glaucoma.  She denies any hx of chest pain or hx of MI.    She has never smoked or consumed alcohol.    Hospital Course:  The patient was admitted to the hospital and taken to the operating room on 05/29/2016 and underwent left carotid endarterectomy.  The pt tolerated the procedure well and was transported to the PACU in good condition.   By POD 1, the pt neuro status is in tact.  She is not having any difficulty swallowing and has ambulated and voiding well.    The remainder of  the hospital course consisted of increasing mobilization and increasing intake of solids without difficulty.    Recent Labs  05/30/16 0543  NA 138  K 3.4*  CL 106  CO2 26  GLUCOSE 96  BUN 10  CALCIUM 8.9    Recent Labs  05/30/16 0543  WBC 7.3  HGB 10.6*  HCT 31.9*  PLT 168   No results for input(s): INR in the last 72 hours.   Discharge Instructions    CAROTID Sugery: Call MD for difficulty swallowing or speaking; weakness in arms or legs that is a new symtom; severe headache.  If you have increased swelling in the neck and/or  are having difficulty breathing, CALL 911    Complete by:  As directed    Call MD for:  redness, tenderness, or signs of infection (pain, swelling, bleeding, redness, odor or green/yellow discharge around incision site)    Complete by:  As directed    Call MD for:  severe or increased pain, loss or decreased feeling  in affected limb(s)    Complete by:  As directed    Call MD for:  temperature >100.5    Complete by:  As directed    Discharge wound care:    Complete by:  As directed    Shower daily with soap and water starting 05/31/16  Driving Restrictions    Complete by:  As directed    No driving for 2 weeks   Lifting restrictions    Complete by:  As directed    No lifting for 2 weeks   Resume previous diet    Complete by:  As directed       Discharge Diagnosis:  Left carotid artery stenosis I65.22  Secondary Diagnosis: Patient Active Problem List   Diagnosis Date Noted  . Symptomatic carotid artery stenosis, left 05/29/2016  . Slurred speech 04/18/2016  . Hypertension, essential 04/18/2016  . Hyperlipidemia 04/18/2016  . Chronic anxiety 04/18/2016   Past Medical History:  Diagnosis Date  . Anxiety   . Blindness of left eye   . Complication of anesthesia    has gas trapped in her body after  . DVT (deep venous thrombosis) (HCC)   . Ear problems   . Family history of adverse reaction to anesthesia    daughter has gas  trapped in her body postop as well  . Glaucoma   . Hypertension   . Pre-diabetes    pt. told by [facility] staff that she is prediabetic, HgbA1c was 5.4  . Stroke [facility])    having TIA's    Allergies as of 05/30/2016      Reactions   Other Hives   Mycins-  Pt has Glaucoma in Right eye, Blind in Left eye .... PLEASE DO NOT GIVE ANYTHING TO PATIENT THAT MIGHT DESTROY VISION    Erythromycin    Reaction is unknown   Prednisone    Altered mental status   Statins Other (See Comments)   Weakness, muscle aches, and pain      Medication List    TAKE these medications   ALPRAZolam 0.25 MG tablet Commonly known as:  XANAX Take 0.125 mg by mouth 2 (two) times daily.   aspirin EC 81 MG tablet Take 1 tablet (81 mg total) by mouth 2 (two) times daily.   lisinopril-hydrochlorothiazide 10-12.5 MG tablet Commonly known as:  PRINZIDE,ZESTORETIC Take 1 tablet by mouth at bedtime.   NEXIUM 24HR 20 MG capsule Generic drug:  esomeprazole Take 20 mg by mouth at bedtime.   oxyCODONE-acetaminophen 5-325 MG tablet Commonly known as:  ROXICET Take 1 tablet by mouth every 6 (six) hours as needed for severe pain.   timolol 0.5 % ophthalmic solution Commonly known as:  BETIMOL Place 1 drop into the right eye 2 (two) times daily.   vitamin B-12 1000 MCG tablet Commonly known as:  CYANOCOBALAMIN Take 1,000 mcg by mouth at bedtime.       Prescriptions given: Roxicet #8 No Refill  Instructions: 1.  No driving x 2 weeks & while taking pain medication 2.  No heavy lifting x 2 weeks 3.  Shower daily with soap and water starting 05/31/16  Disposition: home  Patient's condition: is Good  Follow up: 1. Dr. Arbie Cookey in 2 weeks.   Doreatha Massed, PA-C [facility] [phone]   --- For [facility] use ---   Modified Rankin score at D/C (0-6): 0  IV medication needed for:  1. Hypertension: No 2. Hypotension: No  Post-op Complications: No  1. Post-op CVA or TIA: No  If  yes: Event classification (right eye, left eye, right cortical, left cortical, verterobasilar, other): n/a  If yes: Timing of event (intra-op, <6 hrs post-op, >=6 hrs post-op, unknown): n/a  2. CN injury: No  If yes: CN n/a injuried   3. Myocardial infarction: No  If yes: Dx by (EKG or clinical, Troponin): n/a  4.  CHF: No  5.  Dysrhythmia (new): No  6. Wound infection: No  7. Reperfusion symptoms: No  8. Return to OR: No  If yes: return to OR for (bleeding, neurologic, other CEA incision, other): n/a  Discharge medications: Statin use:  No   If No:  allergy ASA use:  Yes  If No:   Beta blocker use:  No ACE-Inhibitor use:  Yes  ARB use:  No CCB use: No P2Y12 Antagonist use: No, [ ]  Plavix, [ ]  Plasugrel, [ ]  Ticlopinine, [ ]  Ticagrelor, [ ]  Other, [ ]  No for medical reason, [ ]  Non-compliant, [ ]  Not-indicated Anti-coagulant use:  No, [ ]  Warfarin, [ ]  Rivaroxaban, [ ]  Dabigatran,

## 2016-05-30 NOTE — Care Management Note (Signed)
Case Management Note  Patient Details  Name: Sheryl ChanceShirley M Dancer MRN: [id number] Date of Birth: 07/06/1940  Subjective/Objective:   S/p L CEA, for dc today, no needs.                 Action/Plan:   Expected Discharge Date:  05/30/16               Expected Discharge Plan:  Home/Self Care  In-House Referral:     Discharge planning Services  CM Consult  Post Acute Care Choice:    Choice offered to:     DME Arranged:    DME Agency:     HH Arranged:    HH Agency:     Status of Service:  Completed, signed off  If discussed at [employer] Length of Stay Meetings, dates discussed:    Additional Comments:  Leone Havenaylor, Lukisha Procida Clinton, RN 05/30/2016, 6:56 PM

## 2016-05-30 NOTE — Progress Notes (Signed)
Received order to d/c patient.  PIVs removed.  Reviewed discharge education/instructions and handout with pt and daughter at bedside.

## 2016-05-30 NOTE — Progress Notes (Addendum)
  Progress Note    05/30/2016 7:48 AM 1 Day Post-Op  Subjective:  C/o throat being sore, but no difficulty swallowing; she has walked and voiding okay  afebrile HR 50's-70's 90's-100's  Vitals:   05/29/16 2248 05/30/16 0415  BP: 95/72 (!) 104/51  Pulse: 64 65  Resp: 19 17  Temp: 98.7 F (37.1 C) 98.4 F (36.9 C)     Physical Exam: Neuro:  In tact; tongue is midline Lungs:  Non labored Incision:  Clean and dry  CBC    Component Value Date/Time   WBC 7.3 05/30/2016 0543   RBC 3.49 (L) 05/30/2016 0543   HGB 10.6 (L) 05/30/2016 0543   HCT 31.9 (L) 05/30/2016 0543   PLT 168 05/30/2016 0543   MCV 91.4 05/30/2016 0543   MCH 30.4 05/30/2016 0543   MCHC 33.2 05/30/2016 0543   RDW 12.9 05/30/2016 0543   LYMPHSABS 3.7 04/18/2016 1511   MONOABS 0.5 04/18/2016 1511   EOSABS 0.1 04/18/2016 1511   BASOSABS 0.0 04/18/2016 1511    BMET    Component Value Date/Time   NA 138 05/30/2016 0543   K 3.4 (L) 05/30/2016 0543   CL 106 05/30/2016 0543   CO2 26 05/30/2016 0543   GLUCOSE 96 05/30/2016 0543   BUN 10 05/30/2016 0543   CREATININE 0.81 05/30/2016 0543   CALCIUM 8.9 05/30/2016 0543   GFRNONAA >60 05/30/2016 0543   GFRAA >60 05/30/2016 0543     Intake/Output Summary (Last 24 hours) at 05/30/16 0748 Last data filed at 05/30/16 0400  Gross per 24 hour  Intake             2840 ml  Output              950 ml  Net             1890 ml     Assessment/Plan:  This is a 76 y.o. female who is s/p left CEA 1 Day Post-Op  -pt is doing well this am. -pt neuro exam is in tact -pt has ambulated -pt has voided -f/u with Dr. Arbie CookeyEarly in 2 weeks.   Doreatha MassedSamantha Rhyne, PA-C [facility] [phone]  I have examined the patient, reviewed and agree with above.  Gretta BeganEarly, Zakkery Dorian, MD 05/30/2016 8:23 AM

## 2016-06-04 ENCOUNTER — Encounter (HOSPITAL_COMMUNITY): Payer: Self-pay | Admitting: Emergency Medicine

## 2016-06-04 ENCOUNTER — Emergency Department (HOSPITAL_COMMUNITY): Payer: Medicare HMO

## 2016-06-04 ENCOUNTER — Inpatient Hospital Stay (HOSPITAL_COMMUNITY)
Admission: EM | Admit: 2016-06-04 | Discharge: 2016-06-15 | DRG: 234 | Disposition: A | Discharge status: Skilled Nursing Facility | Payer: Medicare HMO | Attending: Cardiothoracic Surgery | Admitting: Cardiothoracic Surgery

## 2016-06-04 DIAGNOSIS — G459 Transient cerebral ischemic attack, unspecified: Secondary | ICD-10-CM | POA: Diagnosis not present

## 2016-06-04 DIAGNOSIS — E8770 Fluid overload, unspecified: Secondary | ICD-10-CM | POA: Diagnosis present

## 2016-06-04 DIAGNOSIS — Z79899 Other long term (current) drug therapy: Secondary | ICD-10-CM | POA: Diagnosis not present

## 2016-06-04 DIAGNOSIS — R7303 Prediabetes: Secondary | ICD-10-CM | POA: Diagnosis present

## 2016-06-04 DIAGNOSIS — K5900 Constipation, unspecified: Secondary | ICD-10-CM | POA: Diagnosis present

## 2016-06-04 DIAGNOSIS — Z888 Allergy status to other drugs, medicaments and biological substances status: Secondary | ICD-10-CM

## 2016-06-04 DIAGNOSIS — I6522 Occlusion and stenosis of left carotid artery: Secondary | ICD-10-CM | POA: Diagnosis not present

## 2016-06-04 DIAGNOSIS — H409 Unspecified glaucoma: Secondary | ICD-10-CM | POA: Diagnosis present

## 2016-06-04 DIAGNOSIS — D62 Acute posthemorrhagic anemia: Secondary | ICD-10-CM | POA: Diagnosis not present

## 2016-06-04 DIAGNOSIS — I10 Essential (primary) hypertension: Secondary | ICD-10-CM | POA: Diagnosis present

## 2016-06-04 DIAGNOSIS — I25110 Atherosclerotic heart disease of native coronary artery with unstable angina pectoris: Secondary | ICD-10-CM | POA: Diagnosis not present

## 2016-06-04 DIAGNOSIS — Z8249 Family history of ischemic heart disease and other diseases of the circulatory system: Secondary | ICD-10-CM

## 2016-06-04 DIAGNOSIS — Z86718 Personal history of other venous thrombosis and embolism: Secondary | ICD-10-CM | POA: Diagnosis not present

## 2016-06-04 DIAGNOSIS — R278 Other lack of coordination: Secondary | ICD-10-CM | POA: Diagnosis not present

## 2016-06-04 DIAGNOSIS — Z66 Do not resuscitate: Secondary | ICD-10-CM | POA: Diagnosis present

## 2016-06-04 DIAGNOSIS — R202 Paresthesia of skin: Secondary | ICD-10-CM

## 2016-06-04 DIAGNOSIS — Z7982 Long term (current) use of aspirin: Secondary | ICD-10-CM | POA: Diagnosis not present

## 2016-06-04 DIAGNOSIS — Z951 Presence of aortocoronary bypass graft: Secondary | ICD-10-CM | POA: Diagnosis not present

## 2016-06-04 DIAGNOSIS — Z9889 Other specified postprocedural states: Secondary | ICD-10-CM

## 2016-06-04 DIAGNOSIS — R778 Other specified abnormalities of plasma proteins: Secondary | ICD-10-CM

## 2016-06-04 DIAGNOSIS — I214 Non-ST elevation (NSTEMI) myocardial infarction: Principal | ICD-10-CM | POA: Diagnosis present

## 2016-06-04 DIAGNOSIS — E785 Hyperlipidemia, unspecified: Secondary | ICD-10-CM | POA: Diagnosis present

## 2016-06-04 DIAGNOSIS — H5462 Unqualified visual loss, left eye, normal vision right eye: Secondary | ICD-10-CM | POA: Diagnosis present

## 2016-06-04 DIAGNOSIS — G43109 Migraine with aura, not intractable, without status migrainosus: Secondary | ICD-10-CM | POA: Diagnosis not present

## 2016-06-04 DIAGNOSIS — F411 Generalized anxiety disorder: Secondary | ICD-10-CM | POA: Diagnosis not present

## 2016-06-04 DIAGNOSIS — J9811 Atelectasis: Secondary | ICD-10-CM

## 2016-06-04 DIAGNOSIS — Z8673 Personal history of transient ischemic attack (TIA), and cerebral infarction without residual deficits: Secondary | ICD-10-CM

## 2016-06-04 DIAGNOSIS — N200 Calculus of kidney: Secondary | ICD-10-CM | POA: Diagnosis not present

## 2016-06-04 DIAGNOSIS — R0602 Shortness of breath: Secondary | ICD-10-CM | POA: Diagnosis not present

## 2016-06-04 DIAGNOSIS — R7989 Other specified abnormal findings of blood chemistry: Secondary | ICD-10-CM | POA: Diagnosis not present

## 2016-06-04 DIAGNOSIS — Z9049 Acquired absence of other specified parts of digestive tract: Secondary | ICD-10-CM | POA: Diagnosis not present

## 2016-06-04 DIAGNOSIS — E7849 Other hyperlipidemia: Secondary | ICD-10-CM

## 2016-06-04 DIAGNOSIS — I25708 Atherosclerosis of coronary artery bypass graft(s), unspecified, with other forms of angina pectoris: Secondary | ICD-10-CM | POA: Diagnosis not present

## 2016-06-04 DIAGNOSIS — J811 Chronic pulmonary edema: Secondary | ICD-10-CM | POA: Diagnosis not present

## 2016-06-04 DIAGNOSIS — Z09 Encounter for follow-up examination after completed treatment for conditions other than malignant neoplasm: Secondary | ICD-10-CM

## 2016-06-04 DIAGNOSIS — R748 Abnormal levels of other serum enzymes: Secondary | ICD-10-CM

## 2016-06-04 DIAGNOSIS — I2510 Atherosclerotic heart disease of native coronary artery without angina pectoris: Secondary | ICD-10-CM | POA: Diagnosis present

## 2016-06-04 DIAGNOSIS — M6281 Muscle weakness (generalized): Secondary | ICD-10-CM | POA: Diagnosis not present

## 2016-06-04 DIAGNOSIS — R9431 Abnormal electrocardiogram [ECG] [EKG]: Secondary | ICD-10-CM | POA: Diagnosis not present

## 2016-06-04 DIAGNOSIS — R4701 Aphasia: Secondary | ICD-10-CM | POA: Diagnosis present

## 2016-06-04 DIAGNOSIS — E876 Hypokalemia: Secondary | ICD-10-CM | POA: Diagnosis not present

## 2016-06-04 DIAGNOSIS — F419 Anxiety disorder, unspecified: Secondary | ICD-10-CM | POA: Diagnosis present

## 2016-06-04 DIAGNOSIS — I222 Subsequent non-ST elevation (NSTEMI) myocardial infarction: Secondary | ICD-10-CM | POA: Diagnosis not present

## 2016-06-04 DIAGNOSIS — R51 Headache: Secondary | ICD-10-CM | POA: Diagnosis not present

## 2016-06-04 DIAGNOSIS — I679 Cerebrovascular disease, unspecified: Secondary | ICD-10-CM | POA: Diagnosis present

## 2016-06-04 DIAGNOSIS — G458 Other transient cerebral ischemic attacks and related syndromes: Secondary | ICD-10-CM | POA: Diagnosis not present

## 2016-06-04 DIAGNOSIS — R410 Disorientation, unspecified: Secondary | ICD-10-CM | POA: Diagnosis not present

## 2016-06-04 DIAGNOSIS — I361 Nonrheumatic tricuspid (valve) insufficiency: Secondary | ICD-10-CM | POA: Diagnosis not present

## 2016-06-04 DIAGNOSIS — R479 Unspecified speech disturbances: Secondary | ICD-10-CM | POA: Diagnosis not present

## 2016-06-04 DIAGNOSIS — Z01810 Encounter for preprocedural cardiovascular examination: Secondary | ICD-10-CM | POA: Diagnosis not present

## 2016-06-04 LAB — COMPREHENSIVE METABOLIC PANEL
ALT: 17 U/L (ref 14–54)
AST: 28 U/L (ref 15–41)
Albumin: 3.4 g/dL — ABNORMAL LOW (ref 3.5–5.0)
Alkaline Phosphatase: 65 U/L (ref 38–126)
Anion gap: 7 (ref 5–15)
BUN: 8 mg/dL (ref 6–20)
CHLORIDE: 100 mmol/L — AB (ref 101–111)
CO2: 30 mmol/L (ref 22–32)
CREATININE: 0.87 mg/dL (ref 0.44–1.00)
Calcium: 9.5 mg/dL (ref 8.9–10.3)
GFR calc Af Amer: >60 mL/min (ref >60)
GFR calc non Af Amer: >60 mL/min (ref >60)
Glucose, Bld: 114 mg/dL — ABNORMAL HIGH (ref 65–99)
POTASSIUM: 3.7 mmol/L (ref 3.5–5.1)
SODIUM: 137 mmol/L (ref 135–145)
Total Bilirubin: 0.4 mg/dL (ref 0.3–1.2)
Total Protein: 6.8 g/dL (ref 6.5–8.1)

## 2016-06-04 LAB — APTT: APTT: 33 s (ref 24–36)

## 2016-06-04 LAB — CBC
HCT: 36.8 % (ref 36.0–46.0)
Hemoglobin: 12.7 g/dL (ref 12.0–15.0)
MCH: 31.6 pg (ref 26.0–34.0)
MCHC: 34.5 g/dL (ref 30.0–36.0)
MCV: 91.5 fL (ref 78.0–100.0)
PLATELETS: 290 10*3/uL (ref 150–400)
RBC: 4.02 MIL/uL (ref 3.87–5.11)
RDW: 12.5 % (ref 11.5–15.5)
WBC: 7.2 10*3/uL (ref 4.0–10.5)

## 2016-06-04 LAB — URINALYSIS, ROUTINE W REFLEX MICROSCOPIC
Bilirubin Urine: NEGATIVE
Glucose, UA: NEGATIVE mg/dL
Hgb urine dipstick: NEGATIVE
Ketones, ur: NEGATIVE mg/dL
LEUKOCYTES UA: NEGATIVE
Nitrite: NEGATIVE
PH: 8 (ref 5.0–8.0)
PROTEIN: NEGATIVE mg/dL
Specific Gravity, Urine: 1.002 — ABNORMAL LOW (ref 1.005–1.030)

## 2016-06-04 LAB — I-STAT TROPONIN, ED
Troponin i, poc: 3.32 ng/mL (ref 0.00–0.08)
Troponin i, poc: 2.93 ng/mL (ref 0.00–0.08)

## 2016-06-04 LAB — DIFFERENTIAL
BASOS ABS: 0 10*3/uL (ref 0.0–0.1)
BASOS PCT: 0 %
Eosinophils Absolute: 0.4 10*3/uL (ref 0.0–0.7)
Eosinophils Relative: 5 %
Lymphocytes Relative: 38 %
Lymphs Abs: 2.7 10*3/uL (ref 0.7–4.0)
Monocytes Absolute: 0.5 10*3/uL (ref 0.1–1.0)
Monocytes Relative: 8 %
NEUTROS ABS: 3.6 10*3/uL (ref 1.7–7.7)
NEUTROS PCT: 49 %

## 2016-06-04 LAB — I-STAT CHEM 8, ED
BUN: 5 mg/dL — ABNORMAL LOW (ref 6–20)
CHLORIDE: 99 mmol/L — AB (ref 101–111)
Calcium, Ion: 1.21 mmol/L (ref 1.15–1.40)
Creatinine, Ser: 0.9 mg/dL (ref 0.44–1.00)
GLUCOSE: 114 mg/dL — AB (ref 65–99)
HEMATOCRIT: 36 % (ref 36.0–46.0)
Hemoglobin: 12.2 g/dL (ref 12.0–15.0)
POTASSIUM: 3.7 mmol/L (ref 3.5–5.1)
SODIUM: 138 mmol/L (ref 135–145)
TCO2: 29 mmol/L (ref 0–100)

## 2016-06-04 LAB — RAPID URINE DRUG SCREEN, HOSP PERFORMED
Amphetamines: NOT DETECTED
BARBITURATES: NOT DETECTED
BENZODIAZEPINES: NOT DETECTED
COCAINE: NOT DETECTED
OPIATES: NOT DETECTED
Tetrahydrocannabinol: NOT DETECTED

## 2016-06-04 LAB — CBG MONITORING, ED: Glucose-Capillary: 116 mg/dL — ABNORMAL HIGH (ref 65–99)

## 2016-06-04 LAB — PROTIME-INR
INR: 0.94
PROTHROMBIN TIME: 12.6 s (ref 11.4–15.2)

## 2016-06-04 LAB — ETHANOL

## 2016-06-04 MED ORDER — ACETAMINOPHEN 325 MG PO TABS: 650.0000 mg | ORAL_TABLET | Freq: Once | ORAL | Status: DC

## 2016-06-04 MED ORDER — ALPRAZOLAM 0.5 MG PO TABS
0.2500 mg | ORAL_TABLET | Freq: Once | ORAL | Status: AC
  Administered 2016-06-04: 0.25 mg via ORAL
  Filled 2016-06-04: qty 1

## 2016-06-04 MED ORDER — TIMOLOL MALEATE 0.5 % OP SOLN
1.0000 [drp] | Freq: Two times a day (BID) | OPHTHALMIC | Status: DC
  Administered 2016-06-04 – 2016-06-15 (×22): 1 [drp] via OPHTHALMIC
  Filled 2016-06-04 (×2): qty 5

## 2016-06-04 NOTE — ED Notes (Signed)
Report called to RN on 4E at this time.

## 2016-06-04 NOTE — ED Notes (Signed)
CRITICAL VALUE ALERT  Critical value received:  Trop. 2.93  Date of notification:  06/04/2016  Time of notification:  2039  Critical value read back: yes  Nurse who received alert:  Rory PercyS. Carrick Rijos RN  MD notified (1st page):  Clarene DukeMcManus  Time of first page:  2039  MD notified (2nd page):  Time of second page:  Responding MD:  Clarene DukeMcManus  Time MD responded:  2039

## 2016-06-04 NOTE — Progress Notes (Signed)
Code stroke, 18:35 call 18:40 exam begun 1842  Exam finished,[facility] EXAM completed in epic,  1850 [place] radiology called Dr Kerrie Pleasure Bloomer answered .Kizzie Furnish/bbj

## 2016-06-04 NOTE — ED Notes (Signed)
AC called to bring timolol drops.

## 2016-06-04 NOTE — ED Triage Notes (Signed)
Patient had stent placed in left carotid on Monday. Per patient since stent placed. Numbness in feet. Headaches, confusion, difficulty with speech, and slurred speech that started at 3pm.

## 2016-06-04 NOTE — ED Notes (Signed)
Per MD Clarene DukeMcManus call off code stroke at this time.

## 2016-06-04 NOTE — H&P (Signed)
TRH H&P    Patient Demographics:    Sheryl Carter, is a 76 y.o. female  MRN: [id number]  DOB - 01-06-1941  Admit Date - 06/04/2016  Referring MD/NP/PA: Dr. Clarene Duke  Outpatient Primary MD for the patient is [name], MD  Patient coming from: Home  Chief Complaint  Patient presents with  . Numbness      HPI:    Sheryl Carter  is a 76 y.o. female, With a history of recent left carotid endarterectomy on 05/29/2016, hypertension, hyperlipidemia, blindness in left eye, glaucoma was brought to the hospital after patient had an episode of slow speech, bilateral feet numbness and questionable fluttering on right upper and lower extremity as per patient's daughter, initially when patient came code stroke was called but later as patient's symptoms had resolved CT scan showed normal findings. Code stroke was canceled. Patient denied chest pain but complains of mild shortness of breath. She does have history of anxiety disorder. And takes Xanax 0.125 mg by mouth twice a day  In the ED lab work showed elevated troponin 3.32, EKG showed no ST elevation \\s . Cardiology Dr. Donnie Aho was consulted by ED physician, who recommended transfer to [facility] for further evaluation. No heparin needed at this time.  She denies nausea vomiting or diarrhea. No dysuria. No fever or chills.    Review of systems:      A full 10 point Review of Systems was done, except as stated above, all other Review of Systems were negative.   With Past History of the following :    Past Medical History:  Diagnosis Date  . Anxiety   . Blindness of left eye   . Complication of anesthesia    has gas trapped in her body after  . DVT (deep venous thrombosis) (HCC)   . Ear problems   . Family history of adverse reaction to anesthesia    daughter has gas trapped in her body postop as well  . Glaucoma   . Hypertension   .  Pre-diabetes    pt. told by [facility] staff that she is prediabetic, HgbA1c was 5.4  . Stroke (HCC)    having TIA's      Past Surgical History:  Procedure Laterality Date  . BREAST SURGERY Left    cyst- L breast  . CHOLECYSTECTOMY    . ENDARTERECTOMY Left 05/29/2016   Procedure: LEFT CAROTID ENDARTERECTOMY WITH PATCH ANGIOPLASTY;  Surgeon: Larina Earthly, MD;  Location: [facility] OR;  Service: Vascular;  Laterality: Left;  . EYE SURGERY        Social History:      Social History  Substance Use Topics  . Smoking status: Never Smoker  . Smokeless tobacco: Never Used  . Alcohol use No       Family History :     Family History  Problem Relation Age of Onset  . Heart disease Sister   . Heart disease Son       Home Medications:   Prior to Admission medications   Medication Sig Start Date  End Date Taking? Authorizing Provider  ALPRAZolam (XANAX) 0.25 MG tablet Take 0.125 mg by mouth 2 (two) times daily.    Yes Historical Provider, MD  aspirin EC 81 MG tablet Take 1 tablet (81 mg total) by mouth 2 (two) times daily. 04/20/16  Yes Leroy Sea, MD  esomeprazole (NEXIUM 24HR) 20 MG capsule Take 20 mg by mouth at bedtime.    Yes Historical Provider, MD  lisinopril-hydrochlorothiazide (PRINZIDE,ZESTORETIC) 10-12.5 MG tablet Take 1 tablet by mouth at bedtime.  02/23/16  Yes Historical Provider, MD  timolol (BETIMOL) 0.5 % ophthalmic solution Place 1 drop into the right eye 2 (two) times daily.    Yes Historical Provider, MD  vitamin B-12 (CYANOCOBALAMIN) 1000 MCG tablet Take 1,000 mcg by mouth at bedtime.    Yes Historical Provider, MD  oxyCODONE-acetaminophen (ROXICET) 5-325 MG tablet Take 1 tablet by mouth every 6 (six) hours as needed for severe pain. 05/29/16   Dara Lords, PA-C     Allergies:     Allergies  Allergen Reactions  . Other Hives    Mycins-   Pt has Glaucoma in Right eye, Blind in Left eye .... PLEASE DO NOT GIVE ANYTHING TO PATIENT THAT MIGHT DESTROY VISION   .  Erythromycin     Reaction is unknown  . Prednisone     Altered mental status  . Statins Other (See Comments)    Weakness, muscle aches, and pain     Physical Exam:   Vitals  Blood pressure 153/83, pulse 86, temperature 98.4 F (36.9 C), resp. rate 17, height 4' 7.5" (1.41 m), weight 83 kg (183 lb), SpO2 97 %.  1.  General: Appears in no acute distress  2. Psychiatric:  Intact judgement and  insight, awake alert, oriented x 3.  3. Neurologic: No focal neurological deficits, all cranial nerves intact.Strength 5/5 all 4 extremities, sensation intact all 4 extremities, plantars down going.  4. Eyes :  anicteric sclerae, moist conjunctivae with no lid lag. PERRLA.  5. ENMT:  Oropharynx clear with moist mucous membranes and good dentition  6. Neck:  supple, no cervical lymphadenopathy appriciated, No thyromegaly  7. Respiratory : Normal respiratory effort, good air movement bilaterally,clear to  auscultation bilaterally  8. Cardiovascular : RRR, no gallops, rubs or murmurs, no leg edema  9. Gastrointestinal:  Positive bowel sounds, abdomen soft, non-tender to palpation,no hepatosplenomegaly, no rigidity or guarding       10. Skin:  No cyanosis, normal texture and turgor, no rash, lesions or ulcers  11.Musculoskeletal:  Good muscle tone,  joints appear normal , no effusions,  normal range of motion    Data Review:    CBC  Recent Labs Lab 05/30/16 0543 06/04/16 1834 06/04/16 1840  WBC 7.3 7.2  --   HGB 10.6* 12.7 12.2  HCT 31.9* 36.8 36.0  PLT 168 290  --   MCV 91.4 91.5  --   MCH 30.4 31.6  --   MCHC 33.2 34.5  --   RDW 12.9 12.5  --   LYMPHSABS  --  2.7  --   MONOABS  --  0.5  --   EOSABS  --  0.4  --   BASOSABS  --  0.0  --    ------------------------------------------------------------------------------------------------------------------  Chemistries   Recent Labs Lab 05/30/16 0543 06/04/16 1834 06/04/16 1840  NA 138 137 138  K 3.4* 3.7  3.7  CL 106 100* 99*  CO2 26 30  --   GLUCOSE 96 114*  114*  BUN 10 8 5*  CREATININE 0.81 0.87 0.90  CALCIUM 8.9 9.5  --   AST  --  28  --   ALT  --  17  --   ALKPHOS  --  65  --   BILITOT  --  0.4  --    ------------------------------------------------------------------------------------------------------------------  ------------------------------------------------------------------------------------------------------------------ GFR: Estimated Creatinine Clearance: 45.6 mL/min (by C-G formula based on SCr of 0.9 mg/dL). Liver Function Tests:  Recent Labs Lab 06/04/16 1834  AST 28  ALT 17  ALKPHOS 65  BILITOT 0.4  PROT 6.8  ALBUMIN 3.4*   No results for input(s): LIPASE, AMYLASE in the last 168 hours. No results for input(s): AMMONIA in the last 168 hours. Coagulation Profile:  Recent Labs Lab 06/04/16 1834  INR 0.94     Recent Labs Lab 05/29/16 1000 06/04/16 1829  GLUCAP 108* 116*    --------------------------------------------------------------------------------------------------------------- Urine analysis:    Component Value Date/Time   COLORURINE COLORLESS (A) 06/04/2016 1909   APPEARANCEUR CLEAR 06/04/2016 1909   LABSPEC 1.002 (L) 06/04/2016 1909   PHURINE 8.0 06/04/2016 1909   GLUCOSEU NEGATIVE 06/04/2016 1909   HGBUR NEGATIVE 06/04/2016 1909   BILIRUBINUR NEGATIVE 06/04/2016 1909   KETONESUR NEGATIVE 06/04/2016 1909   PROTEINUR NEGATIVE 06/04/2016 1909   NITRITE NEGATIVE 06/04/2016 1909   LEUKOCYTESUR NEGATIVE 06/04/2016 1909      Imaging Results:    Dg Chest Port 1 View  Result Date: 06/04/2016 CLINICAL DATA:  Feet numbness after LEFT carotid artery stenting. Headache and confusion. EXAM: PORTABLE CHEST 1 VIEW COMPARISON:  Chest radiograph Aug 17, 2004 FINDINGS: Cardiomediastinal silhouette is unremarkable for this low inspiratory examination with crowded vasculature markings. The lungs are clear without pleural effusions or focal  consolidations. Trachea projects midline and there is no pneumothorax. Included soft tissue planes and osseous structures are non-suspicious. IMPRESSION: No acute cardiopulmonary process for this low inspiratory portable examination. Electronically Signed   By: Awilda Metro M.D.   On: 06/04/2016 19:21   Ct Head Code Stroke W/o Cm  Result Date: 06/04/2016 CLINICAL DATA:  Code stroke. Headache, confusion, speech difficulty beginning at 3 p.m. Status post LEFT carotid stent 1 week ago with subsequent foot numbness. History of hypertension, LEFT eye blindness, stroke. EXAM: CT HEAD WITHOUT CONTRAST TECHNIQUE: Contiguous axial images were obtained from the base of the skull through the vertex without intravenous contrast. COMPARISON:  MRI head and CTA HEAD April 18, 2016 FINDINGS: BRAIN: Stable moderate to severe ventriculomegaly on the basis of global parenchymal brain volume loss. Old RIGHT cerebellar infarct. No intraparenchymal hemorrhage, mass effect, midline shift or acute large vascular territory infarct. Moderate to severe stable ventriculomegaly on the basis of global parenchymal brain volume loss. No abnormal extra-axial fluid collections. Basal cisterns are patent. VASCULAR: Moderate calcific atherosclerosis of the carotid siphons. SKULL: No skull fracture. No significant scalp soft tissue swelling. SINUSES/ORBITS: The mastoid air-cells and included paranasal sinuses are well-aerated.Status post LEFT ocular lens implant. The included ocular globes and orbital contents are non-suspicious. OTHER: None. ASPECTS [facility] Stroke Program Early CT Score) - Ganglionic level infarction (caudate, lentiform nuclei, internal capsule, insula, M1-M3 cortex): 7 - Supraganglionic infarction (M4-M6 cortex): 3 Total score (0-10 with 10 being normal): 10 IMPRESSION: 1. No acute intracranial process. Stable examination including mild to moderate chronic small vessel ischemic disease and old small RIGHT cerebellar  infarct. 2. ASPECTS is 10. Critical Value/emergent results were called by telephone at the time of interpretation on 06/04/2016 at 6:57 pm to Dr. Nicholos Johns  [facility] , who verbally acknowledged these results. Electronically Signed   By: Awilda Metroourtnay  Bloomer M.D.   On: 06/04/2016 18:59    My personal review of EKG: Rhythm NSR, nonspecific ST changes, unchanged from previous EKG from 04/18/2016   Assessment & Plan:    Active Problems:   Hypertension, essential   Chronic anxiety   TIA (transient ischemic attack)   Elevated troponin   1. TIA- patient presented with symptoms of TIA, [facility]. We will obtain echocardiogram, MRA/MRA brain. Carotid duplex, lipid profile, hemoglobin A1c . Continue aspirin 81 mg by mouth twice a day. I called and discussed with neurologist on call Dr. Otelia LimesLindzen, who will see the patient [facility]. 2. Elevated troponin-patient found to have elevated troponin 3.32, unclear cause at this time. EKG shows nonspecific ST changes. Patient has no chest pain. Will cycle troponin every 6 hours 3. Continue aspirin. Dr. Donnie Ahoilley was consulted by ED physician Dr. Clarene DukeMcManus, who [facility]. 3. Glaucoma-patient is blind in left eye, continue timolol 0.5% ophthalmic solution 1 drop into right eye 2 times a day. 4. Anxiety-continue Xanax 0.125 mg twice a day.   DVT Prophylaxis-   Lovenox   AM Labs Ordered, also please review Full Orders  Family Communication: Admission, patients condition and plan of care including tests being ordered have been discussed with the patient and her son and daughter at bedside who indicate understanding and agree with the plan and Code Status.  Code Status: DNR  Admission status: Observation  Time spent in minutes : 60 min.   Meredeth IdeLAMA,Shequilla Goodgame S M.D on 06/04/2016 at 9:03 PM  Between 7am to 7pm - Pager - [phone]. After 7pm go to [email or web address] - password  [email or web address]  [facility] - Office  [phone]

## 2016-06-04 NOTE — ED Notes (Signed)
Paged Code Stroke (per verbal order) at 18:29; called SOC at 18:30

## 2016-06-04 NOTE — ED Notes (Signed)
Pt c/o HA, denies SOB/CP. States her vision is WNL for her, no slurred speech or facial droop noted. 2 81mg  ASA taken today.

## 2016-06-04 NOTE — ED Notes (Signed)
SOC [facility] at this time[facility]

## 2016-06-04 NOTE — ED Notes (Signed)
[facility] at [facility].

## 2016-06-04 NOTE — ED Provider Notes (Signed)
[facility]-EMERGENCY DEPT Provider Note   CSN: [id number] Arrival date & time: 06/04/16  1819     History   Chief Complaint Chief Complaint  Patient presents with  . Numbness    HPI ERNESTO LASHWAY is a 76 y.o. female.  HPI  Pt was seen at 1825. Per pt and her family, c/o sudden onset and persistence of constant confusion, slurred speech and bilat feet "numbness" that began at 1500 today PTA. Pt's family last spoke to her on the phone at 11am today "and she was fine." Pt's family states pt told them she also feels "SOB;" but they feel this is her "panic attack."  Pt was able to ambulate with her cane from the house to the car.  Denies CP/palpitations, no cough, no abd pain, no N/V/D, no back pain, no slurred speech, no focal motor weakness, no fevers.    Past Medical History:  Diagnosis Date  . Anxiety   . Blindness of left eye   . Complication of anesthesia    has gas trapped in her body after  . DVT (deep venous thrombosis) (HCC)   . Ear problems   . Family history of adverse reaction to anesthesia    daughter has gas trapped in her body postop as well  . Glaucoma   . Hypertension   . Pre-diabetes    pt. told by [facility] staff that she is prediabetic, HgbA1c was 5.4  . Stroke [facility])    having TIA's    Patient Active Problem List   Diagnosis Date Noted  . Symptomatic carotid artery stenosis, left 05/29/2016  . Slurred speech 04/18/2016  . Hypertension, essential 04/18/2016  . Hyperlipidemia 04/18/2016  . Chronic anxiety 04/18/2016    Past Surgical History:  Procedure Laterality Date  . BREAST SURGERY Left    cyst- L breast  . CHOLECYSTECTOMY    . ENDARTERECTOMY Left 05/29/2016   Procedure: LEFT CAROTID ENDARTERECTOMY WITH PATCH ANGIOPLASTY;  Surgeon: Larina Earthly, MD;  Location: [facility] OR;  Service: Vascular;  Laterality: Left;  . EYE SURGERY      OB History    No data available       Home Medications    Prior to Admission medications   Medication Sig Start Date  End Date Taking? Authorizing Provider  ALPRAZolam (XANAX) 0.25 MG tablet Take 0.125 mg by mouth 2 (two) times daily.     Historical Provider, MD  aspirin EC 81 MG tablet Take 1 tablet (81 mg total) by mouth 2 (two) times daily. 04/20/16   Leroy Sea, MD  esomeprazole (NEXIUM 24HR) 20 MG capsule Take 20 mg by mouth at bedtime.     Historical Provider, MD  lisinopril-hydrochlorothiazide (PRINZIDE,ZESTORETIC) 10-12.5 MG tablet Take 1 tablet by mouth at bedtime.  02/23/16   Historical Provider, MD  oxyCODONE-acetaminophen (ROXICET) 5-325 MG tablet Take 1 tablet by mouth every 6 (six) hours as needed for severe pain. 05/29/16   Samantha J Rhyne, PA-C  timolol (BETIMOL) 0.5 % ophthalmic solution Place 1 drop into the right eye 2 (two) times daily.     Historical Provider, MD  vitamin B-12 (CYANOCOBALAMIN) 1000 MCG tablet Take 1,000 mcg by mouth at bedtime.     Historical Provider, MD    Family History Family History  Problem Relation Age of Onset  . Heart disease Sister   . Heart disease Son     Social History Social History  Substance Use Topics  . Smoking status: Never Smoker  . Smokeless tobacco:  Never Used  . Alcohol use No     Allergies   Other; Erythromycin; Prednisone; and Statins   Review of Systems Review of Systems ROS: Statement: All systems negative except as marked or noted in the HPI; Constitutional: Negative for fever and chills. ; ; Eyes: Negative for eye pain, redness and discharge. ; ; ENMT: Negative for ear pain, hoarseness, nasal congestion, sinus pressure and sore throat. ; ; Cardiovascular: Negative for chest pain, palpitations, diaphoresis, and peripheral edema. ; ; Respiratory: +SOB. Negative for cough, wheezing and stridor. ; ; Gastrointestinal: Negative for nausea, vomiting, diarrhea, abdominal pain, blood in stool, hematemesis, jaundice and rectal bleeding. . ; ; Genitourinary: Negative for dysuria, flank pain and hematuria. ; ; Musculoskeletal: Negative for  back pain and neck pain. Negative for swelling and trauma.; ; Skin: Negative for pruritus, rash, abrasions, blisters, bruising and skin lesion.; ; Neuro: +slurred speech, confusion, paresthesias.  Negative for headache, lightheadedness and neck stiffness. Negative for weakness, altered level of consciousness, extremity weakness, involuntary movement, seizure and syncope.      Physical Exam Updated Vital Signs BP 116/70   Pulse 89   Resp 17   Ht 4' 7.5" (1.41 m)   Wt 183 lb (83 kg)   SpO2 96%   BMI 41.77 kg/m   Physical Exam 1830: Physical examination:  Nursing notes reviewed; Vital signs and O2 SAT reviewed;  Constitutional: Well developed, Well nourished, Well hydrated, In no acute distress; Head:  Normocephalic, atraumatic; Eyes: EOMI, PERRL, No scleral icterus; ENMT: Mouth and pharynx normal, Mucous membranes moist; Neck: Supple, Full range of motion, No lymphadenopathy; Cardiovascular: Regular rate and rhythm, No gallop; Respiratory: Breath sounds clear & equal bilaterally, No wheezes.  Speaking full sentences with ease, Normal respiratory effort/excursion; Chest: Nontender, Movement normal; Abdomen: Soft, Nontender, Nondistended, Normal bowel sounds; Genitourinary: No CVA tenderness; Extremities: Pulses normal, No tenderness, No edema, No calf edema or asymmetry.; Neuro: AA&Ox3, +blind left eye. Major CN grossly intact. No facial droop. Speech clear. Grips equal. No gross focal motor or sensory deficits in extremities. Climbs from wheelchair to stretcher easily by herself.; Skin: Color normal, Warm, Dry.; Psych:  Anxious.    ED Treatments / Results  Labs (all labs ordered are listed, but only abnormal results are displayed)   EKG  EKG Interpretation  Date/Time:  Sunday June 04 2016 18:26:27 EDT Ventricular Rate:  92 PR Interval:  162 QRS Duration: 82 QT Interval:  362 QTC Calculation: 447 R Axis:   13 Text Interpretation:  Normal sinus rhythm Nonspecific ST and T wave  abnormality When compared with ECG of 04/18/2016 No significant change was found Confirmed by [facility]  MD, Nicholos Johns [phone]) on 06/04/2016 6:46:19 PM       Radiology   Procedures Procedures (including critical care time)  Medications Ordered in ED Medications - No data to display   Initial Impression / Assessment and Plan / ED Course  I have reviewed the triage vital signs and the nursing notes.  Pertinent labs & imaging results that were available during my care of the patient were reviewed by me and considered in my medical decision making (see chart for details).  MDM Reviewed: previous chart, nursing note and vitals Reviewed previous: labs and ECG Interpretation: labs, ECG, x-ray and CT scan Total time providing critical care: 30-74 minutes. This excludes time spent performing separately reportable procedures and services. Consults: neurology, cardiology and admitting MD   CRITICAL CARE Performed by: Laray Anger Total critical care time: 35 minutes Critical  care time was exclusive of separately billable procedures and treating other patients. Critical care was necessary to treat or prevent imminent or life-threatening deterioration. Critical care was time spent personally by me on the following activities: development of treatment plan with patient and/or surrogate as well as nursing, discussions with consultants, evaluation of patient's response to treatment, examination of patient, obtaining history from patient or surrogate, ordering and performing treatments and interventions, ordering and review of laboratory studies, ordering and review of radiographic studies, pulse oximetry and re-evaluation of patient's condition.  Results for orders placed or performed during the hospital encounter of 06/04/16  Ethanol  Result Value Ref Range   Alcohol, Ethyl (B) <5 <5 mg/dL  Protime-INR  Result Value Ref Range   Prothrombin Time 12.6 11.4 - 15.2 seconds   INR 0.94   APTT    Result Value Ref Range   aPTT 33 24 - 36 seconds  CBC  Result Value Ref Range   WBC 7.2 4.0 - 10.5 K/uL   RBC 4.02 3.87 - 5.11 MIL/uL   Hemoglobin 12.7 12.0 - 15.0 g/dL   HCT 13.0 86.5 - 78.4 %   MCV 91.5 78.0 - 100.0 fL   MCH 31.6 26.0 - 34.0 pg   MCHC 34.5 30.0 - 36.0 g/dL   RDW 69.6 29.5 - 28.4 %   Platelets 290 150 - 400 K/uL  Differential  Result Value Ref Range   Neutrophils Relative % 49 %   Neutro Abs 3.6 1.7 - 7.7 K/uL   Lymphocytes Relative 38 %   Lymphs Abs 2.7 0.7 - 4.0 K/uL   Monocytes Relative 8 %   Monocytes Absolute 0.5 0.1 - 1.0 K/uL   Eosinophils Relative 5 %   Eosinophils Absolute 0.4 0.0 - 0.7 K/uL   Basophils Relative 0 %   Basophils Absolute 0.0 0.0 - 0.1 K/uL  Comprehensive metabolic panel  Result Value Ref Range   Sodium 137 135 - 145 mmol/L   Potassium 3.7 3.5 - 5.1 mmol/L   Chloride 100 (L) 101 - 111 mmol/L   CO2 30 22 - 32 mmol/L   Glucose, Bld 114 (H) 65 - 99 mg/dL   BUN 8 6 - 20 mg/dL   Creatinine, Ser 1.32 0.44 - 1.00 mg/dL   Calcium 9.5 8.9 - 44.0 mg/dL   Total Protein 6.8 6.5 - 8.1 g/dL   Albumin 3.4 (L) 3.5 - 5.0 g/dL   AST 28 15 - 41 U/L   ALT 17 14 - 54 U/L   Alkaline Phosphatase 65 38 - 126 U/L   Total Bilirubin 0.4 0.3 - 1.2 mg/dL   GFR calc non Af Amer >60 >60 mL/min   GFR calc Af Amer >60 >60 mL/min   Anion gap 7 5 - 15  Urine rapid drug screen (hosp performed)not at [facility]  Result Value Ref Range   Opiates NONE DETECTED NONE DETECTED   Cocaine NONE DETECTED NONE DETECTED   Benzodiazepines NONE DETECTED NONE DETECTED   Amphetamines NONE DETECTED NONE DETECTED   Tetrahydrocannabinol NONE DETECTED NONE DETECTED   Barbiturates NONE DETECTED NONE DETECTED  Urinalysis, Routine w reflex microscopic  Result Value Ref Range   Color, Urine COLORLESS (A) YELLOW   APPearance CLEAR CLEAR   Specific Gravity, Urine 1.002 (L) 1.005 - 1.030   pH 8.0 5.0 - 8.0   Glucose, UA NEGATIVE NEGATIVE mg/dL   Hgb urine dipstick NEGATIVE NEGATIVE    Bilirubin Urine NEGATIVE NEGATIVE   Ketones, ur NEGATIVE NEGATIVE mg/dL  Protein, ur NEGATIVE NEGATIVE mg/dL   Nitrite NEGATIVE NEGATIVE   Leukocytes, UA NEGATIVE NEGATIVE  CBG monitoring, ED  Result Value Ref Range   Glucose-Capillary 116 (H) 65 - 99 mg/dL  I-Stat Chem 8, ED  (not at [facility])  Result Value Ref Range   Sodium 138 135 - 145 mmol/L   Potassium 3.7 3.5 - 5.1 mmol/L   Chloride 99 (L) 101 - 111 mmol/L   BUN 5 (L) 6 - 20 mg/dL   Creatinine, Ser 1.61 0.44 - 1.00 mg/dL   Glucose, Bld 096 (H) 65 - 99 mg/dL   Calcium, Ion 0.45 4.09 - 1.40 mmol/L   TCO2 29 0 - 100 mmol/L   Hemoglobin 12.2 12.0 - 15.0 g/dL   HCT 81.1 91.4 - 78.2 %  I-stat troponin, ED (not at [facility])  Result Value Ref Range   Troponin i, poc 3.32 (HH) 0.00 - 0.08 ng/mL   Comment NOTIFIED PHYSICIAN    Comment 3          I-stat troponin, ED  Result Value Ref Range   Troponin i, poc 2.93 (HH) 0.00 - 0.08 ng/mL   Comment NOTIFIED PHYSICIAN    Comment 3           Dg Chest Port 1 View Result Date: 06/04/2016 CLINICAL DATA:  Feet numbness after LEFT carotid artery stenting. Headache and confusion. EXAM: PORTABLE CHEST 1 VIEW COMPARISON:  Chest radiograph Aug 17, 2004 FINDINGS: Cardiomediastinal silhouette is unremarkable for this low inspiratory examination with crowded vasculature markings. The lungs are clear without pleural effusions or focal consolidations. Trachea projects midline and there is no pneumothorax. Included soft tissue planes and osseous structures are non-suspicious. IMPRESSION: No acute cardiopulmonary process for this low inspiratory portable examination. Electronically Signed   By: Awilda Metro M.D.   On: 06/04/2016 19:21   Ct Head Code Stroke W/o Cm Result Date: 06/04/2016 CLINICAL DATA:  Code stroke. Headache, confusion, speech difficulty beginning at 3 p.m. Status post LEFT carotid stent 1 week ago with subsequent foot numbness. History of hypertension, LEFT eye blindness,  stroke. EXAM: CT HEAD WITHOUT CONTRAST TECHNIQUE: Contiguous axial images were obtained from the base of the skull through the vertex without intravenous contrast. COMPARISON:  MRI head and CTA HEAD April 18, 2016 FINDINGS: BRAIN: Stable moderate to severe ventriculomegaly on the basis of global parenchymal brain volume loss. Old RIGHT cerebellar infarct. No intraparenchymal hemorrhage, mass effect, midline shift or acute large vascular territory infarct. Moderate to severe stable ventriculomegaly on the basis of global parenchymal brain volume loss. No abnormal extra-axial fluid collections. Basal cisterns are patent. VASCULAR: Moderate calcific atherosclerosis of the carotid siphons. SKULL: No skull fracture. No significant scalp soft tissue swelling. SINUSES/ORBITS: The mastoid air-cells and included paranasal sinuses are well-aerated.Status post LEFT ocular lens implant. The included ocular globes and orbital contents are non-suspicious. OTHER: None. ASPECTS [facility] Stroke Program Early CT Score) - Ganglionic level infarction (caudate, lentiform nuclei, internal capsule, insula, M1-M3 cortex): 7 - Supraganglionic infarction (M4-M6 cortex): 3 Total score (0-10 with 10 being normal): 10 IMPRESSION: 1. No acute intracranial process. Stable examination including mild to moderate chronic small vessel ischemic disease and old small RIGHT cerebellar infarct. 2. ASPECTS is 10. Critical Value/emergent results were called by telephone at the time of interpretation on 06/04/2016 at 6:57 pm to Dr. Samuel Jester , who verbally acknowledged these results. Electronically Signed   By: Awilda Metro M.D.   On: 06/04/2016 18:59    1900:  [facility] MD: he has evaluated pt, states symptoms are resolved at this time, admit for further workup for elevated troponin.   1945:  Troponin elevated; pt denies CP now or at any time recently. SOB is also improved. Doubt troponin elevation is an acute event at this time,  but will obtain 2nd troponin.  Pt now only c/o posterior "headache."  Will dose APAP for headache. No neuro deficits. Pt has ambulated to bedside commode without difficulty. Vasc Surg office note from 05/16/16: bilat ulnar and PT pulses unable to be palpated. [facility]. Donnie Ahoilley, case discussed, including:  HPI, pertinent PM/SHx, VS/PE, dx testing, ED course and treatment:  Unclear if elevated troponin in an acute event (if was an acute event would need to start heparin), [facility] service and Cards service is agreeable to consult ([facility] call [facility]). Dx and testing d/w pt and family.  Questions answered.  Verb understanding, agreeable to admit[facility].   2010:  T/C to [facility] Dr. Sharl MaLama, case discussed, including:  HPI, pertinent PM/SHx, VS/PE, dx testing, ED course and treatment:  Agreeable to facilitate [facility].    2050:  Repeat troponin trending downward.    Final Clinical Impressions(s) / ED Diagnoses   Final diagnoses:  None    New Prescriptions New Prescriptions   No medications on file     Samuel JesterKathleen Erza [place], DO 06/07/16 1546

## 2016-06-04 NOTE — ED Notes (Signed)
Pt reports bilateral feed "numbness" is when pt is standing. States the "numbness" feels like her feet are falling asleep.

## 2016-06-04 NOTE — ED Notes (Signed)
CRITICAL VALUE ALERT  Critical value received:  IstatTroponin 3.32 Date of notification:  06/04/16 Time of notification:  1650 Critical value read back:     Nurse who received alert:  R Melodee Lupe  MD notified (1st page):  Dr Clarene Duke Results also given to tele neuro

## 2016-06-05 ENCOUNTER — Observation Stay (HOSPITAL_COMMUNITY): Payer: Medicare HMO

## 2016-06-05 ENCOUNTER — Encounter (HOSPITAL_COMMUNITY): Payer: Self-pay | Admitting: Radiology

## 2016-06-05 ENCOUNTER — Observation Stay (HOSPITAL_BASED_OUTPATIENT_CLINIC_OR_DEPARTMENT_OTHER): Payer: Medicare HMO

## 2016-06-05 DIAGNOSIS — R7989 Other specified abnormal findings of blood chemistry: Secondary | ICD-10-CM | POA: Diagnosis not present

## 2016-06-05 DIAGNOSIS — R51 Headache: Secondary | ICD-10-CM | POA: Diagnosis not present

## 2016-06-05 DIAGNOSIS — R202 Paresthesia of skin: Secondary | ICD-10-CM | POA: Diagnosis not present

## 2016-06-05 DIAGNOSIS — I214 Non-ST elevation (NSTEMI) myocardial infarction: Secondary | ICD-10-CM | POA: Diagnosis not present

## 2016-06-05 DIAGNOSIS — R479 Unspecified speech disturbances: Secondary | ICD-10-CM

## 2016-06-05 DIAGNOSIS — I6522 Occlusion and stenosis of left carotid artery: Secondary | ICD-10-CM | POA: Diagnosis not present

## 2016-06-05 DIAGNOSIS — R748 Abnormal levels of other serum enzymes: Secondary | ICD-10-CM | POA: Diagnosis not present

## 2016-06-05 DIAGNOSIS — G459 Transient cerebral ischemic attack, unspecified: Secondary | ICD-10-CM | POA: Diagnosis not present

## 2016-06-05 DIAGNOSIS — I25708 Atherosclerosis of coronary artery bypass graft(s), unspecified, with other forms of angina pectoris: Secondary | ICD-10-CM | POA: Diagnosis not present

## 2016-06-05 LAB — GLUCOSE, CAPILLARY: Glucose-Capillary: 113 mg/dL — ABNORMAL HIGH (ref 65–99)

## 2016-06-05 LAB — VAS US CAROTID
LCCADDIAS: -16 cm/s
LCCADSYS: -61 cm/s
LCCAPDIAS: 16 cm/s
LEFT ECA DIAS: -18 cm/s
LEFT VERTEBRAL DIAS: 15 cm/s
LICADSYS: -51 cm/s
Left CCA prox sys: 89 cm/s
Left ICA dist dias: -20 cm/s
Left ICA prox dias: -11 cm/s
Left ICA prox sys: -31 cm/s
RCCADSYS: -65 cm/s
RCCAPDIAS: 20 cm/s
RCCAPSYS: 88 cm/s
RIGHT ECA DIAS: 11 cm/s
RIGHT VERTEBRAL DIAS: -4 cm/s

## 2016-06-05 LAB — CBC
HEMATOCRIT: 35.6 % — AB (ref 36.0–46.0)
Hemoglobin: 11.8 g/dL — ABNORMAL LOW (ref 12.0–15.0)
MCH: 30.3 pg (ref 26.0–34.0)
MCHC: 33.1 g/dL (ref 30.0–36.0)
MCV: 91.5 fL (ref 78.0–100.0)
PLATELETS: 251 10*3/uL (ref 150–400)
RBC: 3.89 MIL/uL (ref 3.87–5.11)
RDW: 12.7 % (ref 11.5–15.5)
WBC: 5.9 10*3/uL (ref 4.0–10.5)

## 2016-06-05 LAB — LIPID PANEL
CHOL/HDL RATIO: 6 ratio
CHOLESTEROL: 216 mg/dL — AB (ref 0–200)
HDL: 36 mg/dL — AB (ref >40)
LDL Cholesterol: 154 mg/dL — ABNORMAL HIGH (ref 0–99)
Triglycerides: 128 mg/dL (ref <150)
VLDL: 26 mg/dL (ref 0–40)

## 2016-06-05 LAB — CREATININE, SERUM
Creatinine, Ser: 0.81 mg/dL (ref 0.44–1.00)
GFR calc non Af Amer: >60 mL/min (ref >60)

## 2016-06-05 LAB — TROPONIN I
TROPONIN I: 3.12 ng/mL — AB (ref <0.03)
TROPONIN I: 2.19 ng/mL — AB (ref <0.03)
Troponin I: 2.55 ng/mL (ref <0.03)

## 2016-06-05 MED ORDER — ASPIRIN EC 325 MG PO TBEC: 325.0000 mg | DELAYED_RELEASE_TABLET | Freq: Two times a day (BID) | ORAL | Status: DC

## 2016-06-05 MED ORDER — SENNOSIDES-DOCUSATE SODIUM 8.6-50 MG PO TABS: 1.0000 | ORAL_TABLET | Freq: Every evening | ORAL | Status: DC | PRN

## 2016-06-05 MED ORDER — PANTOPRAZOLE SODIUM 40 MG PO TBEC
40.0000 mg | DELAYED_RELEASE_TABLET | Freq: Every day | ORAL | Status: DC
  Administered 2016-06-05 – 2016-06-08 (×4): 40 mg via ORAL
  Filled 2016-06-05 (×4): qty 1

## 2016-06-05 MED ORDER — SODIUM CHLORIDE 0.9 % IV SOLN
INTRAVENOUS | Status: DC
  Administered 2016-06-07: 22:00:00 via INTRAVENOUS

## 2016-06-05 MED ORDER — ASPIRIN EC 325 MG PO TBEC
325.0000 mg | DELAYED_RELEASE_TABLET | Freq: Every day | ORAL | Status: DC
  Administered 2016-06-06 – 2016-06-08 (×2): 325 mg via ORAL
  Filled 2016-06-05 (×2): qty 1

## 2016-06-05 MED ORDER — METOPROLOL TARTRATE 25 MG PO TABS
25.0000 mg | ORAL_TABLET | Freq: Two times a day (BID) | ORAL | Status: DC
  Administered 2016-06-05 – 2016-06-08 (×8): 25 mg via ORAL
  Filled 2016-06-05 (×8): qty 1

## 2016-06-05 MED ORDER — ASPIRIN EC 81 MG PO TBEC
81.0000 mg | DELAYED_RELEASE_TABLET | Freq: Two times a day (BID) | ORAL | Status: DC
  Administered 2016-06-05: 81 mg via ORAL
  Filled 2016-06-05: qty 1

## 2016-06-05 MED ORDER — OXYCODONE-ACETAMINOPHEN 5-325 MG PO TABS: 1.0000 | ORAL_TABLET | Freq: Four times a day (QID) | ORAL | Status: DC | PRN

## 2016-06-05 MED ORDER — STROKE: EARLY STAGES OF RECOVERY BOOK
Freq: Once | Status: AC
  Administered 2016-06-05: 04:00:00
  Filled 2016-06-05: qty 1

## 2016-06-05 MED ORDER — PRAVASTATIN SODIUM 20 MG PO TABS
20.0000 mg | ORAL_TABLET | Freq: Every day | ORAL | Status: DC
  Administered 2016-06-05: 20 mg via ORAL
  Filled 2016-06-05: qty 1

## 2016-06-05 MED ORDER — ALPRAZOLAM 0.25 MG PO TABS
0.1250 mg | ORAL_TABLET | Freq: Two times a day (BID) | ORAL | Status: DC
  Administered 2016-06-05 – 2016-06-08 (×8): 0.125 mg via ORAL
  Filled 2016-06-05 (×8): qty 1

## 2016-06-05 MED ORDER — ASPIRIN EC 325 MG PO TBEC: 325.0000 mg | DELAYED_RELEASE_TABLET | Freq: Every day | ORAL | Status: DC

## 2016-06-05 MED ORDER — WITCH HAZEL-GLYCERIN EX PADS
MEDICATED_PAD | CUTANEOUS | Status: DC | PRN
  Filled 2016-06-05 (×2): qty 100

## 2016-06-05 MED ORDER — ENOXAPARIN SODIUM 40 MG/0.4ML ~~LOC~~ SOLN
40.0000 mg | SUBCUTANEOUS | Status: DC
  Administered 2016-06-05 – 2016-06-08 (×4): 40 mg via SUBCUTANEOUS
  Filled 2016-06-05 (×4): qty 0.4

## 2016-06-05 NOTE — Significant Event (Signed)
Rapid Response Event Note  Overview: Called by bedside RN d/t patient feeling like she isn't able to "get her words out." Time Called: 1844 Arrival Time: 1850 Event Type: Neurologic  Initial Focused Assessment: Patient alert and oriented, sitting in bed.  NIH-0, cbg-113.  Skin warm and dry.  VS: HR-95, bp-128/82, SP02-95 on RA, RR 16.  Pt states she was having trouble getting her words out and that she is feeling better now.  No aphasia noted on exam.  Interventions: NIH-0, cbg-113, VSS  Plan of Care (if not transferred): Monitor pt. Notify Dr. Jomarie LongsJoseph, call RRT if assistance needed Event Summary: Name of Physician Notified: Dr. Jomarie LongsJoseph at [phone]    at    Outcome: Stayed in room and stabalized     [place], Dim[place] AguasMindy Anderson

## 2016-06-05 NOTE — Progress Notes (Addendum)
PROGRESS NOTE    Sheryl Carter  WUJ:[id number] DOB: 22-Jun-1940 DOA: 06/04/2016 PCP: [name], MD Brief Narrative:Sheryl Carter  is a 76 y.o. female, With a history of recent left carotid endarterectomy on 05/29/2016, hypertension, hyperlipidemia, blindness in left eye, glaucoma was brought to the hospital after patient had sudden speech impairment, bilateral feet numbness and a headache.In the ED lab work showed elevated troponin 3.32, EKG showed no acute ST changes. CT head unremarkable.Cardiology Dr. Donnie Aho was called-fellow on call by ED physician from [facility]  Assessment & Plan:   1. Suspected TIA -MRI negative for Acute CVA -continue ASA.statin -FU echo/carotid duplex -symptoms resolved -appreciate Neuro input, EEG ordered  2. Elevated Troponin, NSTEMI vs Demand ischemia -peak of 3.3, trending down 2.5 now, EKG without acute findings -no chest pain, pt thinks she had a panic attack yesterday afternoon -Cards consulted, await input, FU ECHO -continue ASA/statin, add metoprolol  3. recent left carotid endarterectomy on 05/29/2016 -continue ASA and statin, MRI without acute findings  4. Glaucoma -continue eye drops  5. Anxiety -continue xanax PRN  Code status: DNR Family Communication:daughter at bedside Dispo: home pending above workup  Consultants:  Neurology Cards  Subjective: Feels well, no chest pain, no further slurring or numbness  Objective: Vitals:   06/05/16 0125 06/05/16 0355 06/05/16 0700 06/05/16 0748  BP: 123/73 132/70  136/75  Pulse: 73 74 78 80  Resp: 20 17 15 14   Temp: 98.4 F (36.9 C) 98.2 F (36.8 C)  98.4 F (36.9 C)  TempSrc: Oral Oral  Oral  SpO2: 98% 97% 95% 95%  Weight: 83.2 kg (183 lb 6.8 oz)     Height: 4\' 7"  (1.397 m)      No intake or output data in the 24 hours ending 06/05/16 1129 Filed Weights   06/04/16 1824 06/05/16 0125  Weight: 83 kg (183 lb) 83.2 kg (183 lb 6.8 oz)    Examination:  General exam:  Obese, AAOx3 HEENT: L neck incision dry Respiratory system: Clear to auscultation. Respiratory effort normal. Cardiovascular system: S1 & S2 heard, RRR. No JVD,  Gastrointestinal system: Abdomen is nondistended, soft and nontender. Normal bowel sounds heard. Central nervous system: Alert and oriented. No focal neurological deficits. Extremities: Symmetric 5 x 5 power. Skin: No rashes, lesions or ulcers Psychiatry: Judgement and insight appear normal. Mood & affect appropriate.     Data Reviewed: I have personally reviewed following labs and imaging studies  CBC:  Recent Labs Lab 05/30/16 0543 06/04/16 1834 06/04/16 1840 06/05/16 0222  WBC 7.3 7.2  --  5.9  NEUTROABS  --  3.6  --   --   HGB 10.6* 12.7 12.2 11.8*  HCT 31.9* 36.8 36.0 35.6*  MCV 91.4 91.5  --  91.5  PLT 168 290  --  251   Basic Metabolic Panel:  Recent Labs Lab 05/30/16 0543 06/04/16 1834 06/04/16 1840 06/05/16 0222  NA 138 137 138  --   K 3.4* 3.7 3.7  --   CL 106 100* 99*  --   CO2 26 30  --   --   GLUCOSE 96 114* 114*  --   BUN 10 8 5*  --   CREATININE 0.81 0.87 0.90 0.81  CALCIUM 8.9 9.5  --   --    GFR: Estimated Creatinine Clearance: 50.1 mL/min (by C-G formula based on SCr of 0.81 mg/dL). Liver Function Tests:  Recent Labs Lab 06/04/16 1834  AST 28  ALT 17  ALKPHOS 65  BILITOT 0.4  PROT 6.8  ALBUMIN 3.4*   No results for input(s): LIPASE, AMYLASE in the last 168 hours. No results for input(s): AMMONIA in the last 168 hours. Coagulation Profile:  Recent Labs Lab 06/04/16 1834  INR 0.94   Cardiac Enzymes:  Recent Labs Lab 06/05/16 0222 06/05/16 0806  TROPONINI 3.12* 2.55*   BNP (last 3 results) No results for input(s): PROBNP in the last 8760 hours. HbA1C: No results for input(s): HGBA1C in the last 72 hours. CBG:  Recent Labs Lab 06/04/16 1829  GLUCAP 116*   Lipid Profile:  Recent Labs  06/05/16 0222  CHOL 216*  HDL 36*  LDLCALC 154*  TRIG 128    CHOLHDL 6.0   Thyroid Function Tests: No results for input(s): TSH, T4TOTAL, FREET4, T3FREE, THYROIDAB in the last 72 hours. Anemia Panel: No results for input(s): VITAMINB12, FOLATE, FERRITIN, TIBC, IRON, RETICCTPCT in the last 72 hours. Urine analysis:    Component Value Date/Time   COLORURINE COLORLESS (A) 06/04/2016 1909   APPEARANCEUR CLEAR 06/04/2016 1909   LABSPEC 1.002 (L) 06/04/2016 1909   PHURINE 8.0 06/04/2016 1909   GLUCOSEU NEGATIVE 06/04/2016 1909   HGBUR NEGATIVE 06/04/2016 1909   BILIRUBINUR NEGATIVE 06/04/2016 1909   KETONESUR NEGATIVE 06/04/2016 1909   PROTEINUR NEGATIVE 06/04/2016 1909   NITRITE NEGATIVE 06/04/2016 1909   LEUKOCYTESUR NEGATIVE 06/04/2016 1909   Sepsis Labs: @LABRCNTIP (procalcitonin:4,lacticidven:4)  )No results found for this or any previous visit (from the past 240 hour(s)).       Radiology Studies: Mr Brain 49 Contrast  Result Date: 06/05/2016 CLINICAL DATA:  Acute presentation with headache, confusion and speech disturbance. Numbness in the feet. EXAM: MRI HEAD WITHOUT CONTRAST MRA HEAD WITHOUT CONTRAST TECHNIQUE: Multiplanar, multiecho pulse sequences of the brain and surrounding structures were obtained without intravenous contrast. Angiographic images of the head were obtained using MRA technique without contrast. COMPARISON:  CT 06/04/2016.  MRI 04/18/2016. FINDINGS: MRI HEAD FINDINGS Brain: Diffusion imaging does not show any acute or subacute infarction. The brainstem is normal. There is an old infarction in the inferior cerebellum on the right which is unchanged. Cerebral hemispheres show age related atrophy with moderate chronic small-vessel ischemic changes affecting the deep and subcortical white matter. No mass lesion, hemorrhage, hydrocephalus or extra-axial collection. No visible change since the previous study. Vascular: Major vessels at the base of the brain show flow. Skull and upper cervical spine: Negative Sinuses/Orbits:  Clear/ normal. Small amount of mastoid air cell fluid. Other: None significant MRA HEAD FINDINGS Both internal carotid artery's are patent through the skullbase and siphon regions. There is considerable atherosclerotic irregularity in the carotid siphon regions but without suspicion of flow limiting stenosis. Supraclinoid internal carotid arteries are normal. Anterior and middle cerebral arteries are widely patent. Fetal origin left PCA. Left vertebral artery is dominant. Small right vertebral artery show antegrade flow to PICA up. No flow an the right vertebral artery beyond PICA. Basilar artery shows some atherosclerotic irregularity without flow limiting stenosis. Anterior inferior cerebellar arteries, superior cerebellar arteries and posterior cerebral arteries are patent bilaterally. Left PCA takes fetal origin as noted previously. IMPRESSION: MRI head: No acute finding. 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 Moderate chronic small-vessel ischemic changes affecting the cerebral hemispheres as seen previously. MRA head: No change. Atherosclerotic change in both carotid siphon regions without flow limiting stenosis. No intracranial large or medium vessel anterior circulation abnormal finding. Small right vertebral artery which gives flow to right PICA but does not contribute to the  basilar. Dominant left vertebral artery widely patent to the basilar. Atherosclerotic irregularity of the basilar artery without flow limiting stenosis. Electronically Signed   By: Paulina Fusi M.D.   On: 06/05/2016 07:43   Dg Chest Port 1 View  Result Date: 06/04/2016 CLINICAL DATA:  Feet numbness after LEFT carotid artery stenting. Headache and confusion. EXAM: PORTABLE CHEST 1 VIEW COMPARISON:  Chest radiograph Aug 17, 2004 FINDINGS: Cardiomediastinal silhouette is unremarkable for this low inspiratory examination with crowded vasculature markings. The lungs are clear without pleural effusions or focal consolidations.  Trachea projects midline and there is no pneumothorax. Included soft tissue planes and osseous structures are non-suspicious. IMPRESSION: No acute cardiopulmonary process for this low inspiratory portable examination. Electronically Signed   By: Awilda Metro M.D.   On: 06/04/2016 19:21   [name] Head/brain WU Cm  Result Date: 06/05/2016 CLINICAL DATA:  Acute presentation with headache, confusion and speech disturbance. Numbness in the feet. EXAM: MRI HEAD WITHOUT CONTRAST MRA HEAD WITHOUT CONTRAST TECHNIQUE: Multiplanar, multiecho pulse sequences of the brain and surrounding structures were obtained without intravenous contrast. Angiographic images of the head were obtained using MRA technique without contrast. COMPARISON:  CT 06/04/2016.  MRI 04/18/2016. FINDINGS: MRI HEAD FINDINGS Brain: Diffusion imaging does not show any acute or subacute infarction. The brainstem is normal. There is an old infarction in the inferior cerebellum on the right which is unchanged. Cerebral hemispheres show age related atrophy with moderate chronic small-vessel ischemic changes affecting the deep and subcortical white matter. No mass lesion, hemorrhage, hydrocephalus or extra-axial collection. No visible change since the previous study. Vascular: Major vessels at the base of the brain show flow. Skull and upper cervical spine: Negative Sinuses/Orbits: Clear/ normal. Small amount of mastoid air cell fluid. Other: None significant MRA HEAD FINDINGS Both internal carotid artery's are patent through the skullbase and siphon regions. There is considerable atherosclerotic irregularity in the carotid siphon regions but without suspicion of flow limiting stenosis. Supraclinoid internal carotid arteries are normal. Anterior and middle cerebral arteries are widely patent. Fetal origin left PCA. Left vertebral artery is dominant. Small right vertebral artery show antegrade flow to PICA up. No flow an the right vertebral artery beyond  PICA. Basilar artery shows some atherosclerotic irregularity without flow limiting stenosis. Anterior inferior cerebellar arteries, superior cerebellar arteries and posterior cerebral arteries are patent bilaterally. Left PCA takes fetal origin as noted previously. IMPRESSION: MRI head: No acute finding. 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 Moderate chronic small-vessel ischemic changes affecting the cerebral hemispheres as seen previously. MRA head: No change. Atherosclerotic change in both carotid siphon regions without flow limiting stenosis. No intracranial large or medium vessel anterior circulation abnormal finding. Small right vertebral artery which gives flow to right PICA but does not contribute to the basilar. Dominant left vertebral artery widely patent to the basilar. Atherosclerotic irregularity of the basilar artery without flow limiting stenosis. Electronically Signed   By: Paulina Fusi M.D.   On: 06/05/2016 07:43   Ct Head Code Stroke W/o Cm  Result Date: 06/04/2016 CLINICAL DATA:  Code stroke. Headache, confusion, speech difficulty beginning at 3 p.m. Status post LEFT carotid stent 1 week ago with subsequent foot numbness. History of hypertension, LEFT eye blindness, stroke. EXAM: CT HEAD WITHOUT CONTRAST TECHNIQUE: Contiguous axial images were obtained from the base of the skull through the vertex without intravenous contrast. COMPARISON:  MRI head and CTA HEAD April 18, 2016 FINDINGS: BRAIN: Stable moderate to severe ventriculomegaly on the basis of  global parenchymal brain volume loss. Old RIGHT cerebellar infarct. No intraparenchymal hemorrhage, mass effect, midline shift or acute large vascular territory infarct. Moderate to severe stable ventriculomegaly on the basis of global parenchymal brain volume loss. No abnormal extra-axial fluid collections. Basal cisterns are patent. VASCULAR: Moderate calcific atherosclerosis of the carotid siphons. SKULL: No skull fracture. No  significant scalp soft tissue swelling. SINUSES/ORBITS: The mastoid air-cells and included paranasal sinuses are well-aerated.Status post LEFT ocular lens implant. The included ocular globes and orbital contents are non-suspicious. OTHER: None. ASPECTS [facility]([place] Stroke Program Early CT Score) - Ganglionic level infarction (caudate, lentiform nuclei, internal capsule, insula, M1-M3 cortex): 7 - Supraganglionic infarction (M4-M6 cortex): 3 Total score (0-10 with 10 being normal): 10 IMPRESSION: 1. No acute intracranial process. Stable examination including mild to moderate chronic small vessel ischemic disease and old small RIGHT cerebellar infarct. 2. ASPECTS is 10. Critical Value/emergent results were called by telephone at the time of interpretation on 06/04/2016 at 6:57 pm to Dr. Samuel JesterKATHLEEN MCMANUS , who verbally acknowledged these results. Electronically Signed   By: Awilda Metroourtnay  Bloomer M.D.   On: 06/04/2016 18:59        Scheduled Meds: . ALPRAZolam  0.125 mg Oral BID  . [START ON 06/06/2016] aspirin EC  325 mg Oral Daily  . enoxaparin (LOVENOX) injection  40 mg Subcutaneous Q24H  . pantoprazole  40 mg Oral Daily  . pravastatin  20 mg Oral q1800  . timolol  1 drop Right Eye BID   Continuous Infusions: . sodium chloride       LOS: 0 days    Time spent: 35min    Zannie CovePreetha Britley Gashi, MD [facility] Pager [phone]  If 7PM-7AM, please contact night-coverage [email or web address] Password [email or web address] 06/05/2016, 11:29 AM

## 2016-06-05 NOTE — Procedures (Signed)
ELECTROENCEPHALOGRAM REPORT  Date of Study: 06/05/2016  Patient's Name: Sheryl Carter MRN: [id number] Date of Birth: 04/26/1940  Referring Provider: Dr. Marvel PlanJindong Xu  Clinical History: This is a 76 year old woman with speech difficulties, headache, bilateral foot numbness.  Medications: ALPRAZolam (XANAX) tablet 0.125 mg  aspirin EC tablet 325 mg  enoxaparin (LOVENOX) injection 40 mg  metoprolol tartrate (LOPRESSOR) tablet 25 mg  oxyCODONE-acetaminophen (PERCOCET/ROXICET) 5-325 MG per tablet 1 tablet  pantoprazole (PROTONIX) EC tablet 40 mg  pravastatin (PRAVACHOL) tablet 20 mg  senna-docusate (Senokot-S) tablet 1 tablet  timolol (TIMOPTIC) 0.5 % ophthalmic solution 1 drop   Technical Summary: A multichannel digital EEG recording measured by the international 10-20 system with electrodes applied with paste and impedances below 5000 ohms performed in our laboratory with EKG monitoring in an awake and asleep patient.  Hyperventilation was not performed. Photic stimulation was performed.  The digital EEG was referentially recorded, reformatted, and digitally filtered in a variety of bipolar and referential montages for optimal display.    Description: The patient is awake and asleep during the recording.  During maximal wakefulness, there is a symmetric, medium voltage 9.5 Hz posterior dominant rhythm that attenuates with eye opening.  The record is symmetric.  There is an excess amount of diffuse low voltage beta activity seen throughout the recording. During drowsiness and sleep, there is an increase in theta slowing of the background. Vertex waves and symmetric sleep spindles were seen. Photic stimulation did not elicit any abnormalities.  There were no epileptiform discharges or electrographic seizures seen.    EKG lead was unremarkable.  Impression: This awake and asleep EEG is normal except for excess amount of diffuse low voltage beta activity.  Clinical Correlation: Diffuse  low voltage beta activity is commonly seen with sedating medications such as benzodiazepines.  In the absence of sedating medications, anxiety and hyperthyroidism may produce generalized beta activity.  The absence of epileptiform discharges does not exclude a clinical diagnosis of epilepsy. Clinical correlation is advised.   Patrcia DollyKaren Pierina Schuknecht, M.D.

## 2016-06-05 NOTE — Progress Notes (Signed)
**  Preliminary report by tech**  Carotid artery duplex complete. Status post left carotid artery endarterectomy on 05/29/16.  Findings are consistent with a 1-39 percent stenosis involving the right internal carotid artery and the left internal carotid artery. The vertebral arteries demonstrate antegrade flow.  06/05/16 4:43 PM Olen CordialGreg Mikaeel Petrow RVT

## 2016-06-05 NOTE — Care Management Note (Signed)
Case Management Note  Patient Details  Name: Loleta ChanceShirley M Cesaro MRN: [id number] Date of Birth: 06/07/1940  Subjective/Objective:   From home, had recent CEA 3/5. At home having episode of slow speech, feet numbness and ? Fluttering on right upper and lower ext, CT shows normal findings. She is for MRI today, Trops elevated, pt/ot ordered.  NCM will cont to follow for dc needs.                  Action/Plan:   Expected Discharge Date:                  Expected Discharge Plan:     In-House Referral:     Discharge planning Services     Post Acute Care Choice:    Choice offered to:     DME Arranged:    DME Agency:     HH Arranged:    HH Agency:     Status of Service:     If discussed at [employer] Length of Stay Meetings, dates discussed:    Additional Comments:  Leone Havenaylor, Lanell Dubie Clinton, RN 06/05/2016, 11:11 AM

## 2016-06-05 NOTE — Progress Notes (Signed)
Dr. Donnie Ahoilley from cardiology and Dr. Otelia LimesLindzen from neurology notified that pt has arrived from [facility]

## 2016-06-05 NOTE — Progress Notes (Signed)
STROKE TEAM PROGRESS NOTE   HISTORY OF PRESENT ILLNESS (per record) Sheryl Carter is an 76 y.o. female who [facility] with headaches, confusion, difficulty with speech and numbness in feet. She stated that her symptoms began after she had a left carotid endarterectomy on Monday. She took two 81 mg ASA prior to her arrival at [facility] on Sunday. The foot numbness would occur when she was standing and was described as feeling like her feet were falling asleep. The headache was 8/10, to the back of her head, throbbing and without alleviating or exacerbating factors. Her speech deficit is described by daughter as having been slow, halting speech output without grammatical errors. She also exhibited transient twitching/fluttering movements of her RUE and RLE. A code stroke was called on [facility] and then was cancelled as patient's symptoms subsequently resolved. Patient was not administered IV t-PA secondary to resolved symptoms. 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 She denied CP but complained of SOB. Troponin level drawn at [facility] was elevated at 3.32. EKG was without ST elevation. Heparin was not felt to be indicated. [facility] evaluation and treatment.   SUBJECTIVE (INTERVAL HISTORY) Daughter and son in law are at bedside. Daughter recounted HPI with me. 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 However, in Dr. Ronal Fearoonquah's note in 03/2016, he concerned for anxiety. This admission also MRI negative for stroke. Both episodes were associated with HA. Daughter also stated that pt had some hand twitching yesterday out of pt notice. Denies anxiety or stress. Daughter stated that pt may be stressed due to her brother passed away one year ago this time around.    OBJECTIVE Temp:  [98.2 F (36.8 C)-98.4 F (36.9 C)]  98.4 F (36.9 C) (03/12 0748) Pulse Rate:  [73-100] 80 (03/12 0748) Cardiac Rhythm: Normal sinus rhythm (03/12 0730) Resp:  [14-26] 14 (03/12 0748) BP: (109-163)/(70-107) 136/75 (03/12 0748) SpO2:  [93 %-98 %] 95 % (03/12 0748) Weight:  [83 kg (183 lb)-83.2 kg (183 lb 6.8 oz)] 83.2 kg (183 lb 6.8 oz) (03/12 0125)  CBC:  Recent Labs Lab 06/04/16 1834 06/04/16 1840 06/05/16 0222  WBC 7.2  --  5.9  NEUTROABS 3.6  --   --   HGB 12.7 12.2 11.8*  HCT 36.8 36.0 35.6*  MCV 91.5  --  91.5  PLT 290  --  251    Basic Metabolic Panel:  Recent Labs Lab 05/30/16 0543 06/04/16 1834 06/04/16 1840 06/05/16 0222  NA 138 137 138  --   K 3.4* 3.7 3.7  --   CL 106 100* 99*  --   CO2 26 30  --   --   GLUCOSE 96 114* 114*  --   BUN 10 8 5*  --   CREATININE 0.81 0.87 0.90 0.81  CALCIUM 8.9 9.5  --   --     Lipid Panel:    Component Value Date/Time   CHOL 216 (H) 06/05/2016 0222   TRIG 128 06/05/2016 0222   HDL 36 (L) 06/05/2016 0222   CHOLHDL 6.0 06/05/2016 0222   VLDL 26 06/05/2016 0222   LDLCALC 154 (H) 06/05/2016 0222   HgbA1c:  Lab Results  Component Value Date   HGBA1C 5.4 04/19/2016   Urine Drug Screen:    Component Value  Date/Time   LABOPIA NONE DETECTED 06/04/2016 1909   COCAINSCRNUR NONE DETECTED 06/04/2016 1909   LABBENZ NONE DETECTED 06/04/2016 1909   AMPHETMU NONE DETECTED 06/04/2016 1909   THCU NONE DETECTED 06/04/2016 1909   LABBARB NONE DETECTED 06/04/2016 1909      IMAGING I have personally reviewed the radiological images below and agree with the radiology interpretations.  Ct Head Code Stroke W/o Cm 06/04/2016 1. No acute intracranial process. Stable examination including mild to moderate chronic small vessel ischemic disease and old small RIGHT cerebellar infarct. 2. ASPECTS is 10.   [name] 06/05/2016 No acute finding. 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 Moderate chronic small-vessel ischemic changes affecting the cerebral  hemispheres as seen previously.   [name] Head/brain Wo Cm 06/05/2016 No change. Atherosclerotic change in both carotid siphon regions without flow limiting stenosis. No intracranial large or medium vessel anterior circulation abnormal finding. Small right vertebral artery which gives flow to right PICA but does not contribute to the basilar. Dominant left vertebral artery widely patent to the basilar. Atherosclerotic irregularity of the basilar artery without flow limiting stenosis.   CUS pending  TTE pending   EEG pending   PHYSICAL EXAM  Temp:  [98.2 F (36.8 C)-98.4 F (36.9 C)] 98.4 F (36.9 C) (03/12 0748) Pulse Rate:  [73-100] 80 (03/12 0748) Resp:  [14-26] 14 (03/12 0748) BP: (109-163)/(70-107) 136/75 (03/12 0748) SpO2:  [93 %-98 %] 95 % (03/12 0748) Weight:  [183 lb (83 kg)-183 lb 6.8 oz (83.2 kg)] 183 lb 6.8 oz (83.2 kg) (03/12 0125)  General - Well nourished, well developed, in no apparent distress.  Ophthalmologic - Fundi not visualized due to eye movement.  Cardiovascular - Regular rate and rhythm.  Mental Status -  Level of arousal and orientation to time, place, and person were intact. Language including expression, naming, repetition, comprehension was assessed and found intact. Attention span and concentration were impaired, not able to back spell WORLD and only calculate very simple math Fund of Knowledge was assessed and was intact.  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 III, IV, VI - Extraocular movements intact. V - Facial sensation intact bilaterally. VII - Facial movement intact bilaterally. VIII - Hearing & vestibular intact bilaterally. X - Palate elevates symmetrically. XI - Chin turning & shoulder shrug intact bilaterally. XII - Tongue protrusion intact.  Motor Strength - The patient's strength was normal in all extremities and pronator drift was absent.  Bulk was normal and  fasciculations were absent.   Motor Tone - Muscle tone was assessed at the neck and appendages and was normal.  Reflexes - The patient's reflexes were 1+ in all extremities and she had no pathological reflexes.  Sensory - Light touch, temperature/pinprick were assessed and were symmetrical.    Coordination - The patient had normal movements in the hands and feet with no ataxia or dysmetria.  Tremor was absent.  Gait and Station - deferred.   ASSESSMENT/PLAN Ms. DUSTEE BOTTENFIELD is a 76 y.o. female with history of L CEA 05/29/16, HTN, HLD, glaucoma, blindness in left eye, DVT and anxiety presenting with headaches, confusion, difficulty with speech and numbness in feet. She did not receive IV t-PA due to resolution of symptoms.   Complicated migraine vs. Anxiety, less likely seizure or TIA  Resultant  Neuro deficit resolved  Code Stroke CT no acute abnormality. small vessel disease. 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 Aspects 10  MRI  No acute stroke. small vessel disease. 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  Carotid Doppler  pending  2D Echo  pending  EEG pending  LDL 154  HgbA1c pending, but 5.4 in 03/2016  Lovenox 40 mg sq daily for VTE prophylaxis  Diet Heart Room service appropriate? Yes; Fluid consistency: Thin  aspirin 81 mg daily prior to admission, now on aspirin 81 mg daily. Will increase to ASA 325mg   Patient counseled to be compliant with her antithrombotic medications  Ongoing aggressive stroke risk factor management  Therapy recommendations:  pending   Disposition:  pending   Hx of similar episode - dizzy, slurry speech, HA  03/2016 in [place]  MRI negative for acute infarct  CTA left ICA 60% stenosis, right VA occlusion  Concerning for anxiety / stress  Put on ASA and statin  Left carotid stenosis s/p CEA  Done by Dr. Arbie Cookey 05/29/16, uneventful  Left neck incision site dry  and clean  CUS pending  Hypertension  Stable  BP goal normotensive  Hyperlipidemia  Home meds:  No statin due to myalgia  LDL 154, goal < 70  Add pravastatin which pt said able to tolerate in the past  Continue statin at discharge  Other Stroke Risk Factors  Advanced age  Morbid Obesity, Body mass index is 42.63 kg/m., recommend weight loss, diet and exercise as appropriate   ? Hx DVT  Other Active Problems  Glaucoma, Blind L eye  Elevated troponin 3.32->2.93->3.12->2.55 - trending down, EKG no ST-T elevation  Anxiety   Hospital day # 0  Marvel Plan, MD PhD Stroke Neurology 06/05/2016 11:46 AM   To contact Stroke Continuity provider, please refer to [email or web address]. After hours, contact General Neurology

## 2016-06-05 NOTE — Progress Notes (Signed)
PT Cancellation Note  Patient Details Name: Sheryl Carter MRN: [id number] DOB: 06/17/1940   Cancelled Treatment:    Reason Eval/Treat Not Completed: Medical issues which prohibited therapy. Pt with troponin 3.12 this morning. Will hold PT until pt cleared by cardiology.    Keyandra Swenson L Aerielle Stoklosa 06/05/2016, 8:29 AM

## 2016-06-05 NOTE — Progress Notes (Addendum)
Alerted to pts room.  Pt stating she doesn't feel right and she was trying to speak but she was unable to get her words out.  Pt alert and oriented.  Vital signs obtained - HR 70-80, BP 128/82 (96), Resp 15-18, and SpO2 high 90%.  NIH completed by bedside RN and no changes.  Rapid called and at bedside.    MD notified.  Pt stating she is feeling better.

## 2016-06-05 NOTE — Progress Notes (Signed)
  Echocardiogram 2D Echocardiogram has been performed.  Arvil ChacoFoster, Cam Dauphin 06/05/2016, 4:47 PM

## 2016-06-05 NOTE — Progress Notes (Signed)
OT Cancellation Note  Patient Details Name: Loleta ChanceShirley M Bennetts MRN: [id number] DOB: 03/13/1941   Cancelled Treatment:    Reason Eval/Treat Not Completed: Other (comment). Elevated troponins. Will await clearance prior to assessment. Thanks.  [facility]  Sahiba Granholm, OT/L  [phone] 06/05/2016 06/05/2016, 8:54 AM

## 2016-06-05 NOTE — Progress Notes (Signed)
EEG completed, results pending. 

## 2016-06-05 NOTE — Consult Note (Signed)
Referring Physician: Dr. Hal Hope    Chief Complaint: Headaches, confusion, difficulty with speech and numbness in feet.   HPI: Sheryl Carter is an 76 y.o. female who presented to [facility] with headaches, confusion, difficulty with speech and numbness in feet. She stated that her symptoms began after she had a left carotid endarterectomy on Monday. She took two 81 mg ASA prior to her arrival at [facility] on Sunday. The foot numbness would occur when she was standing and was described as feeling like her feet were falling asleep. The headache was 8/10, to the back of her head, throbbing and without alleviating or exacerbating factors. Her speech deficit is described by daughter as having been slow, halting speech output without grammatical errors. She also exhibited transient twitching/fluttering movements of her RUE and RLE. A code stroke was called on arrival to [facility] and then was cancelled as patient's symptoms subsequently resolved. 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 She denied CP but complained of SOB. Troponin level drawn at [facility] was elevated at 3.32. EKG was without ST elevation. Heparin was not felt to be indicated. She was transferred to [facility] for further evaluation.  Her PMHx includes HTN, HLD, glaucoma, blindness in left eye, DVT and anxiety.  Home medications include B12, lisinopril/HCTZ, ASA and Xanax BID.   ROS: No vision changes, diarrhea, N/V, fever or chills.   Past Medical History:  Diagnosis Date  . Anxiety   . Blindness of left eye   . Complication of anesthesia    has gas trapped in her body after  . DVT (deep venous thrombosis) ([place])   . Ear problems   . Family history of adverse reaction to anesthesia    daughter has gas trapped in her body postop as well  . Glaucoma   . Hypertension   . Pre-diabetes    pt. told by [facility] staff that she is prediabetic, HgbA1c was 5.4  . Stroke ([place])    having TIA's    Past Surgical  History:  Procedure Laterality Date  . BREAST SURGERY Left    cyst- L breast  . CHOLECYSTECTOMY    . ENDARTERECTOMY Left 05/29/2016   Procedure: LEFT CAROTID ENDARTERECTOMY WITH PATCH ANGIOPLASTY;  Surgeon: Rosetta Posner, MD;  Location: [place];  Service: Vascular;  Laterality: Left;  . EYE SURGERY      Family History  Problem Relation Age of Onset  . Heart disease Sister   . Heart disease Son    Social History:  reports that she has never smoked. She has never used smokeless tobacco. She reports that she does not drink alcohol or use drugs.  Allergies:  Allergies  Allergen Reactions  . Other Hives    Mycins-   Pt has Glaucoma in Right eye, Blind in Left eye .... PLEASE DO NOT GIVE ANYTHING TO PATIENT THAT MIGHT DESTROY VISION   . Erythromycin     Reaction is unknown  . Prednisone     Altered mental status  . Statins Other (See Comments)    Weakness, muscle aches, and pain    Home Medications:  ALPRAZolam (XANAX) 0.25 MG tablet Take 0.125 mg by mouth 2 (two) times daily.  Oswald Hillock, MD Reordered  Orderedas:ALPRAZolam Duanne Moron) tablet 0.125 mg - 0.125 mg, Oral, 2 times daily, First dose on Mon 06/05/16 at 1000  aspirin EC 81 MG tablet Take 1 tablet (81 mg total) by mouth 2 (two) times daily. Oswald Hillock,  MD Reordered  Orderedas:aspirin EC tablet 81 mg - 81 mg, Oral, 2 times daily, First dose on Mon 06/05/16 at 0145  esomeprazole (NEXIUM 24HR) 20 MG capsule Take 20 mg by mouth at bedtime.  Oswald Hillock, MD Reordered  Orderedas:pantoprazole (PROTONIX) EC tablet 40 mg - 40 mg, Oral, Daily, First dose on Mon 06/05/16 at 1000  lisinopril-hydrochlorothiazide (PRINZIDE,ZESTORETIC) 10-12.5 MG tablet Take 1 tablet by mouth at bedtime.  Oswald Hillock, MD Not Ordered  oxyCODONE-acetaminophen (ROXICET) 5-325 MG tablet Take 1 tablet by mouth every 6 (six) hours as needed for severe pain. Oswald Hillock, MD Reordered  Orderedas:oxyCODONE-acetaminophen (PERCOCET/ROXICET) 5-325 MG per tablet 1  tablet - 1 tablet, Oral, Every 6 hours PRN, severe pain, Starting Mon 06/05/16 at 0135  timolol (BETIMOL) 0.5 % ophthalmic solution Place 1 drop into the right eye 2 (two) times daily.  Oswald Hillock, MD Not Ordered  vitamin B-12 (CYANOCOBALAMIN) 1000 MCG tablet Take 1,000 mcg by mouth at bedtime.  Oswald Hillock, MD Not Ordered   ROS: As per HPI.   Physical Examination: Blood pressure 129/90, pulse 75, temperature 98.4 F (36.9 C), temperature source Oral, resp. rate 21, height '4\' 7"'  (1.397 m), weight 83.2 kg (183 lb 6.8 oz), SpO2 95 %.  HEENT: [place]/AT. Dense left corneal opacity/membrane is noted. Healing surgical scar along lateral aspect of neck on the left.  Lungs: Respirations unlabored.  Ext: No edema.   Neurologic Examination: Mental Status: Alert, oriented, thought content appropriate.  Speech fluent with intact comprehension, repetition and naming. Able to follow all commands without difficulty. Cranial Nerves: II:  Blind in left eye; dense left corneal opacity/membrane is noted. Visual fields intact OD. Right pupil round and reactive to light.  III,IV, VI: ptosis not present, EOMI without nystagmus V,VII: smile symmetric, decreased facial temperature sensation on the left.   VIII: hearing intact to conversation IX,X: No hypophonia XI: Slight lag on left in conjunction with 4/5 weakness with shoulder shrug XII: midline tongue extension Motor: Right : Upper extremity   5/5    Left:     Upper extremity   5/5  Lower extremity   5/5     Lower extremity   5/5 Normal tone throughout; no atrophy noted No pronator or parietal drift noted.  Sensory: Temperature and light touch intact proximally in all 4 extremities without extinction. Focused exam of feet also reveals normal sensation along dorsal and plantar aspects.  Deep Tendon Reflexes:  Trace brachioradialis and biceps reflexes bilaterally Hypoactive patellar and achilles reflexes bilaterally. Downgoing toes bilaterally.  Cerebellar:  No ataxia with FNF bilaterally.  Gait: Deferred.   Results for orders placed or performed during the hospital encounter of 06/04/16 (from the past 48 hour(s))  CBG monitoring, ED     Status: Abnormal   Collection Time: 06/04/16  6:29 PM  Result Value Ref Range   Glucose-Capillary 116 (H) 65 - 99 mg/dL  Ethanol     Status: None   Collection Time: 06/04/16  6:34 PM  Result Value Ref Range   Alcohol, Ethyl (B) <5 <5 mg/dL    Comment:        LOWEST DETECTABLE LIMIT FOR SERUM ALCOHOL IS 5 mg/dL FOR MEDICAL PURPOSES ONLY   Protime-INR     Status: None   Collection Time: 06/04/16  6:34 PM  Result Value Ref Range   Prothrombin Time 12.6 11.4 - 15.2 seconds   INR 0.94   APTT     Status: None  Collection Time: 06/04/16  6:34 PM  Result Value Ref Range   aPTT 33 24 - 36 seconds  CBC     Status: None   Collection Time: 06/04/16  6:34 PM  Result Value Ref Range   WBC 7.2 4.0 - 10.5 K/uL   RBC 4.02 3.87 - 5.11 MIL/uL   Hemoglobin 12.7 12.0 - 15.0 g/dL   HCT 36.8 36.0 - 46.0 %   MCV 91.5 78.0 - 100.0 fL   MCH 31.6 26.0 - 34.0 pg   MCHC 34.5 30.0 - 36.0 g/dL   RDW 12.5 11.5 - 15.5 %   Platelets 290 150 - 400 K/uL  Differential     Status: None   Collection Time: 06/04/16  6:34 PM  Result Value Ref Range   Neutrophils Relative % 49 %   Neutro Abs 3.6 1.7 - 7.7 K/uL   Lymphocytes Relative 38 %   Lymphs Abs 2.7 0.7 - 4.0 K/uL   Monocytes Relative 8 %   Monocytes Absolute 0.5 0.1 - 1.0 K/uL   Eosinophils Relative 5 %   Eosinophils Absolute 0.4 0.0 - 0.7 K/uL   Basophils Relative 0 %   Basophils Absolute 0.0 0.0 - 0.1 K/uL  Comprehensive metabolic panel     Status: Abnormal   Collection Time: 06/04/16  6:34 PM  Result Value Ref Range   Sodium 137 135 - 145 mmol/L   Potassium 3.7 3.5 - 5.1 mmol/L   Chloride 100 (L) 101 - 111 mmol/L   CO2 30 22 - 32 mmol/L   Glucose, Bld 114 (H) 65 - 99 mg/dL   BUN 8 6 - 20 mg/dL   Creatinine, Ser 0.87 0.44 - 1.00 mg/dL   Calcium 9.5 8.9 - 10.3  mg/dL   Total Protein 6.8 6.5 - 8.1 g/dL   Albumin 3.4 (L) 3.5 - 5.0 g/dL   AST 28 15 - 41 U/L   ALT 17 14 - 54 U/L   Alkaline Phosphatase 65 38 - 126 U/L   Total Bilirubin 0.4 0.3 - 1.2 mg/dL   GFR calc non Af Amer >60 >60 mL/min   GFR calc Af Amer >60 >60 mL/min    Comment: (NOTE) The eGFR has been calculated using the CKD EPI equation. This calculation has not been validated in all clinical situations. eGFR's persistently <60 mL/min signify possible Chronic Kidney Disease.    Anion gap 7 5 - 15  I-stat troponin, ED (not at [facility], [facility])     Status: Abnormal   Collection Time: 06/04/16  6:39 PM  Result Value Ref Range   Troponin i, poc 3.32 (HH) 0.00 - 0.08 ng/mL   Comment NOTIFIED PHYSICIAN    Comment 3            Comment: Due to the release kinetics of cTnI, a negative result within the first hours of the onset of symptoms does not rule out myocardial infarction with certainty. If myocardial infarction is still suspected, repeat the test at appropriate intervals.   I-Stat Chem 8, ED  (not at [facility], [facility])     Status: Abnormal   Collection Time: 06/04/16  6:40 PM  Result Value Ref Range   Sodium 138 135 - 145 mmol/L   Potassium 3.7 3.5 - 5.1 mmol/L   Chloride 99 (L) 101 - 111 mmol/L   BUN 5 (L) 6 - 20 mg/dL   Creatinine, Ser 0.90 0.44 - 1.00 mg/dL   Glucose, Bld 114 (H) 65 - 99 mg/dL   Calcium,  Ion 1.21 1.15 - 1.40 mmol/L   TCO2 29 0 - 100 mmol/L   Hemoglobin 12.2 12.0 - 15.0 g/dL   HCT 36.0 36.0 - 46.0 %  Urine rapid drug screen (hosp performed)not at [facility]     Status: None   Collection Time: 06/04/16  7:09 PM  Result Value Ref Range   Opiates NONE DETECTED NONE DETECTED   Cocaine NONE DETECTED NONE DETECTED   Benzodiazepines NONE DETECTED NONE DETECTED   Amphetamines NONE DETECTED NONE DETECTED   Tetrahydrocannabinol NONE DETECTED NONE DETECTED   Barbiturates NONE DETECTED NONE DETECTED    Comment:        DRUG SCREEN FOR MEDICAL PURPOSES ONLY.  IF CONFIRMATION IS  NEEDED FOR ANY PURPOSE, NOTIFY LAB WITHIN 5 DAYS.        LOWEST DETECTABLE LIMITS FOR URINE DRUG SCREEN Drug Class       Cutoff (ng/mL) Amphetamine      1000 Barbiturate      200 Benzodiazepine   465 Tricyclics       035 Opiates          300 Cocaine          300 THC              50   Urinalysis, Routine w reflex microscopic     Status: Abnormal   Collection Time: 06/04/16  7:09 PM  Result Value Ref Range   Color, Urine COLORLESS (A) YELLOW   APPearance CLEAR CLEAR   Specific Gravity, Urine 1.002 (L) 1.005 - 1.030   pH 8.0 5.0 - 8.0   Glucose, UA NEGATIVE NEGATIVE mg/dL   Hgb urine dipstick NEGATIVE NEGATIVE   Bilirubin Urine NEGATIVE NEGATIVE   Ketones, ur NEGATIVE NEGATIVE mg/dL   Protein, ur NEGATIVE NEGATIVE mg/dL   Nitrite NEGATIVE NEGATIVE   Leukocytes, UA NEGATIVE NEGATIVE  I-stat troponin, ED     Status: Abnormal   Collection Time: 06/04/16  8:33 PM  Result Value Ref Range   Troponin i, poc 2.93 (HH) 0.00 - 0.08 ng/mL   Comment NOTIFIED PHYSICIAN    Comment 3            Comment: Due to the release kinetics of cTnI, a negative result within the first hours of the onset of symptoms does not rule out myocardial infarction with certainty. If myocardial infarction is still suspected, repeat the test at appropriate intervals.    Dg Chest Port 1 View  Result Date: 06/04/2016 CLINICAL DATA:  Feet numbness after LEFT carotid artery stenting. Headache and confusion. EXAM: PORTABLE CHEST 1 VIEW COMPARISON:  Chest radiograph Aug 17, 2004 FINDINGS: Cardiomediastinal silhouette is unremarkable for this low inspiratory examination with crowded vasculature markings. The lungs are clear without pleural effusions or focal consolidations. Trachea projects midline and there is no pneumothorax. Included soft tissue planes and osseous structures are non-suspicious. IMPRESSION: No acute cardiopulmonary process for this low inspiratory portable examination. Electronically Signed   By:  Elon Alas M.D.   On: 06/04/2016 19:21   Ct Head Code Stroke W/o Cm  Result Date: 06/04/2016 CLINICAL DATA:  Code stroke. Headache, confusion, speech difficulty beginning at 3 p.m. Status post LEFT carotid stent 1 week ago with subsequent foot numbness. History of hypertension, LEFT eye blindness, stroke. EXAM: CT HEAD WITHOUT CONTRAST TECHNIQUE: Contiguous axial images were obtained from the base of the skull through the vertex without intravenous contrast. COMPARISON:  MRI head and CTA HEAD April 18, 2016 FINDINGS: BRAIN: Stable moderate to severe  ventriculomegaly on the basis of global parenchymal brain volume loss. Old RIGHT cerebellar infarct. No intraparenchymal hemorrhage, mass effect, midline shift or acute large vascular territory infarct. Moderate to severe stable ventriculomegaly on the basis of global parenchymal brain volume loss. No abnormal extra-axial fluid collections. Basal cisterns are patent. VASCULAR: Moderate calcific atherosclerosis of the carotid siphons. SKULL: No skull fracture. No significant scalp soft tissue swelling. SINUSES/ORBITS: The mastoid air-cells and included paranasal sinuses are well-aerated.Status post LEFT ocular lens implant. The included ocular globes and orbital contents are non-suspicious. OTHER: None. ASPECTS [facility] Stroke Program Early CT Score) - Ganglionic level infarction (caudate, lentiform nuclei, internal capsule, insula, M1-M3 cortex): 7 - Supraganglionic infarction (M4-M6 cortex): 3 Total score (0-10 with 10 being normal): 10 IMPRESSION: 1. No acute intracranial process. Stable examination including mild to moderate chronic small vessel ischemic disease and old small RIGHT cerebellar infarct. 2. ASPECTS is 10. Critical Value/emergent results were called by telephone at the time of interpretation on 06/04/2016 at 6:57 pm to Dr. Francine Graven , who verbally acknowledged these results. Electronically Signed   By: Elon Alas M.D.   On:  06/04/2016 18:59    Assessment: 76 y.o. female with multifocal neurological and non-neurological symptoms. 1. Neurological exam shows no lateralizing findings except for subjective decreased temperature sensation to left side of face and mild weakness of shoulder shrug on the left.  2. CT shows mild to moderate chronic small vessel ischemic disease and old small right cerebellar infarct. 3. Has statin allergy.  4. Stroke Risk Factors - HTN, HLD and history of DVT 5. Diffusely hypoactive reflexes. DDx includes hypothyroidism and effect of benzodiazepine medication.   Plan: 1. MRI of the brain without contrast 2. MRA of head and neck 3. Has had recent echocardiogram, which showed no mural thrombus 4. Telemetry monitoring 5. PT consult, OT consult, Speech consult 6. Continue ASA 7. Risk factor modification 8. No statin - has allergy 9. Frequent neuro checks 10. HgbA1c, fasting lipid panel 11. TSH level. 12. Permissive HTN x 24 hours   '@Electronically'  signed: Dr. Kerney Elbe  06/05/2016, 1:59 AM

## 2016-06-05 NOTE — Plan of Care (Signed)
Problem: Self-Care: Goal: Ability to communicate needs accurately will improve Outcome: Progressing Pt is fully alert and oriented and able to communicate her needs, will continue to monitor

## 2016-06-05 NOTE — Consult Note (Signed)
Cardiology Consult    Patient ID: Sheryl Carter MRN: [id number], DOB/AGE: 1940-12-27   Admit date: 06/04/2016 Date of Consult: 06/05/2016  Primary Physician: [name], MD Reason for Consult: Elevated Troponin Primary Cardiologist: New to [facility] - Lives in [place], [place] Requesting Provider: Dr. Jomarie Longs   History of Present Illness    Sheryl Carter is a 76 y.o. female with past medical history of HTN, HLD, Glaucoma, and recent left carotid endarterectomy (05/29/2016) who presented to [facility] ED on 06/04/2016 for evaluation of slurred speech and lower extremity numbness.   She was admitted for similar symptoms in 03/2016 and diagnosed with a TIA at that time. Echo that admission showed a preserved EF of 60-65% with no regional WMA. Trivial TR and a prominent pericardiac fat pad were noted. Was again admitted from 3/5 - 05/30/2016 for carotid artery stenosis and planned left carotid endarterectomy with no complications noted.   In talking with the patient and her daughter, she reports developing a severe headache yesterday afternoon. She took ASA with no improvement in her symptoms. She also developed numbness along her feet. When she called her daughter to review her symptoms, her daughter noted she was talking very slow and perhaps slurring her words. Therefore she picked her up and took her to the nearest ED.  She denies any associated chest discomfort, palpitations, or dyspnea yesterday. No prior history of chest pain. No known personal history of CAD. Her half-sister had CHF. No prior alcohol use, tobacco use, or recreational drug use.  CODE STROKE was initially called upon arrival to the ED but later cancelled as CT Imaging showed no acute intracranial processes with stable examination of the mild to moderate small vessel disease and an old small right cerebellar infarct. CXR showed no acute cardiopulmonary process.   Labs with WBC 7.2, Hgb 12.7, platelets 290. Na+ 137. K+  3.7. Creatinine 0.87. Initial troponin 3.32, with repeat values at 2.93, 3.12, and 2.55. EKG shows NSR, HR 92, with slight ST depression along inferior and lateral leads (simialr to prior tracings).    It was recommended she be transferred to [facility] for further evaluation. 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   She denies any recurrent symptoms since being admitted. Is anxious to go home.   Past Medical History   Past Medical History:  Diagnosis Date  . Anxiety   . Blindness of left eye   . Complication of anesthesia    has gas trapped in her body after  . DVT (deep venous thrombosis) (HCC)   . Ear problems   . Family history of adverse reaction to anesthesia    daughter has gas trapped in her body postop as well  . Glaucoma   . Hypertension   . Pre-diabetes    pt. told by [facility] staff that she is prediabetic, HgbA1c was 5.4  . Stroke (HCC)    having TIA's    Past Surgical History:  Procedure Laterality Date  . BREAST SURGERY Left    cyst- L breast  . CHOLECYSTECTOMY    . ENDARTERECTOMY Left 05/29/2016   Procedure: LEFT CAROTID ENDARTERECTOMY WITH PATCH ANGIOPLASTY;  Surgeon: Larina Earthly, MD;  Location: [facility] OR;  Service: Vascular;  Laterality: Left;  . EYE SURGERY       Allergies  Allergies  Allergen Reactions  . Other Hives    Mycins-   Pt has Glaucoma in Right  eye, Blind in Left eye .... PLEASE DO NOT GIVE ANYTHING TO PATIENT THAT MIGHT DESTROY VISION   . Erythromycin     Reaction is unknown  . Prednisone     Altered mental status  . Statins Other (See Comments)    Weakness, muscle aches, and pain    Inpatient Medications    . ALPRAZolam  0.125 mg Oral BID  . [START ON 06/06/2016] aspirin EC  325 mg Oral Daily  . enoxaparin (LOVENOX) injection  40 mg Subcutaneous Q24H  . metoprolol tartrate  25 mg Oral BID  . pantoprazole  40 mg Oral Daily  . pravastatin  20 mg Oral q1800  . timolol  1  drop Right Eye BID    Family History    Family History  Problem Relation Age of Onset  . Heart disease Sister   . Heart disease Son     Social History    Social History   Social History  . Marital status: Divorced    Spouse name: N/A  . Number of children: N/A  . Years of education: N/A   Occupational History  . Not on file.   Social History Main Topics  . Smoking status: Never Smoker  . Smokeless tobacco: Never Used  . Alcohol use No  . Drug use: No  . Sexual activity: Not on file   Other Topics Concern  . Not on file   Social History Narrative  . No narrative on file     Review of Systems    General:  No chills, fever, night sweats or weight changes.  Cardiovascular:  No chest pain, dyspnea on exertion, edema, orthopnea, palpitations, paroxysmal nocturnal dyspnea. Dermatological: No rash, lesions/masses Respiratory: No cough, dyspnea Urologic: No hematuria, dysuria Abdominal:   No nausea, vomiting, diarrhea, bright red blood per rectum, melena, or hematemesis Neurologic:  No visual changes, wkns, changes in mental status. Positive for headaches and lower extremity numbness.  All other systems reviewed and are otherwise negative except as noted above.  Physical Exam    Blood pressure (!) 147/77, pulse 82, temperature 97.7 F (36.5 C), temperature source Oral, resp. rate 20, height 4\' 7"  (1.397 m), weight 183 lb 6.8 oz (83.2 kg), SpO2 96 %.  General: Pleasant, elderly Caucasian female appearing in NAD Psych: Normal affect. Neuro: Alert and oriented X 3. Moves all extremities spontaneously. HEENT: Normal  Neck: Supple without bruits or JVD. Lungs:  Resp regular and unlabored, CTA without wheezing or rales. Heart: RRR no s3, s4, or murmurs. Abdomen: Soft, non-tender, non-distended, BS + x 4.  Extremities: No clubbing, cyanosis or edema. DP/PT/Radials 2+ and equal bilaterally.  Labs    Troponin [facility] of Care Test)  Recent Labs  06/04/16 2033    TROPIPOC 2.93*    Recent Labs  06/05/16 0222 06/05/16 0806  TROPONINI 3.12* 2.55*   Lab Results  Component Value Date   WBC 5.9 06/05/2016   HGB 11.8 (L) 06/05/2016   HCT 35.6 (L) 06/05/2016   MCV 91.5 06/05/2016   PLT 251 06/05/2016    Recent Labs Lab 06/04/16 1834 06/04/16 1840 06/05/16 0222  NA 137 138  --   K 3.7 3.7  --   CL 100* 99*  --   CO2 30  --   --   BUN 8 5*  --   CREATININE 0.87 0.90 0.81  CALCIUM 9.5  --   --   PROT 6.8  --   --   BILITOT 0.4  --   --  ALKPHOS 65  --   --   ALT 17  --   --   AST 28  --   --   GLUCOSE 114* 114*  --    Lab Results  Component Value Date   CHOL 216 (H) 06/05/2016   HDL 36 (L) 06/05/2016   LDLCALC 154 (H) 06/05/2016   TRIG 128 06/05/2016   No results found for: [facility]   Radiology Studies    [name]  Result Date: 06/05/2016 CLINICAL DATA:  Acute presentation with headache, confusion and speech disturbance. Numbness in the feet. EXAM: MRI HEAD WITHOUT CONTRAST MRA HEAD WITHOUT CONTRAST TECHNIQUE: Multiplanar, multiecho pulse sequences of the brain and surrounding structures were obtained without intravenous contrast. Angiographic images of the head were obtained using MRA technique without contrast. COMPARISON:  CT 06/04/2016.  MRI 04/18/2016. FINDINGS: MRI HEAD FINDINGS Brain: Diffusion imaging does not show any acute or subacute infarction. The brainstem is normal. There is an old infarction in the inferior cerebellum on the right which is unchanged. Cerebral hemispheres show age related atrophy with moderate chronic small-vessel ischemic changes affecting the deep and subcortical white matter. No mass lesion, hemorrhage, hydrocephalus or extra-axial collection. No visible change since the previous study. Vascular: Major vessels at the base of the brain show flow. Skull and upper cervical spine: Negative Sinuses/Orbits: Clear/ normal. Small amount of mastoid air cell fluid. Other: None significant MRA HEAD  FINDINGS Both internal carotid artery's are patent through the skullbase and siphon regions. There is considerable atherosclerotic irregularity in the carotid siphon regions but without suspicion of flow limiting stenosis. Supraclinoid internal carotid arteries are normal. Anterior and middle cerebral arteries are widely patent. Fetal origin left PCA. Left vertebral artery is dominant. Small right vertebral artery show antegrade flow to PICA up. No flow an the right vertebral artery beyond PICA. Basilar artery shows some atherosclerotic irregularity without flow limiting stenosis. Anterior inferior cerebellar arteries, superior cerebellar arteries and posterior cerebral arteries are patent bilaterally. Left PCA takes fetal origin as noted previously. IMPRESSION: MRI head: No acute finding. 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 Moderate chronic small-vessel ischemic changes affecting the cerebral hemispheres as seen previously. MRA head: No change. Atherosclerotic change in both carotid siphon regions without flow limiting stenosis. No intracranial large or medium vessel anterior circulation abnormal finding. Small right vertebral artery which gives flow to right PICA but does not contribute to the basilar. Dominant left vertebral artery widely patent to the basilar. Atherosclerotic irregularity of the basilar artery without flow limiting stenosis. Electronically Signed   By: Paulina FusiMark  Shogry M.D.   On: 06/05/2016 07:43   Dg Chest Port 1 View  Result Date: 06/04/2016 CLINICAL DATA:  Feet numbness after LEFT carotid artery stenting. Headache and confusion. EXAM: PORTABLE CHEST 1 VIEW COMPARISON:  Chest radiograph Aug 17, 2004 FINDINGS: Cardiomediastinal silhouette is unremarkable for this low inspiratory examination with crowded vasculature markings. The lungs are clear without pleural effusions or focal consolidations. Trachea projects midline and there is no pneumothorax. Included soft tissue planes and  osseous structures are non-suspicious. IMPRESSION: No acute cardiopulmonary process for this low inspiratory portable examination. Electronically Signed   By: Awilda Metroourtnay  Bloomer M.D.   On: 06/04/2016 19:21   Mr Maxine GlennMra Head/brain ZOWo Cm  Result Date: 06/05/2016 CLINICAL DATA:  Acute presentation with headache, confusion and speech disturbance. Numbness in the feet. EXAM: MRI HEAD WITHOUT CONTRAST MRA HEAD WITHOUT CONTRAST TECHNIQUE: Multiplanar, multiecho pulse sequences of the brain and surrounding structures were  obtained without intravenous contrast. Angiographic images of the head were obtained using MRA technique without contrast. COMPARISON:  CT 06/04/2016.  MRI 04/18/2016. FINDINGS: MRI HEAD FINDINGS Brain: Diffusion imaging does not show any acute or subacute infarction. The brainstem is normal. There is an old infarction in the inferior cerebellum on the right which is unchanged. Cerebral hemispheres show age related atrophy with moderate chronic small-vessel ischemic changes affecting the deep and subcortical white matter. No mass lesion, hemorrhage, hydrocephalus or extra-axial collection. No visible change since the previous study. Vascular: Major vessels at the base of the brain show flow. Skull and upper cervical spine: Negative Sinuses/Orbits: Clear/ normal. Small amount of mastoid air cell fluid. Other: None significant MRA HEAD FINDINGS Both internal carotid artery's are patent through the skullbase and siphon regions. There is considerable atherosclerotic irregularity in the carotid siphon regions but without suspicion of flow limiting stenosis. Supraclinoid internal carotid arteries are normal. Anterior and middle cerebral arteries are widely patent. Fetal origin left PCA. Left vertebral artery is dominant. Small right vertebral artery show antegrade flow to PICA up. No flow an the right vertebral artery beyond PICA. Basilar artery shows some atherosclerotic irregularity without flow limiting  stenosis. Anterior inferior cerebellar arteries, superior cerebellar arteries and posterior cerebral arteries are patent bilaterally. Left PCA takes fetal origin as noted previously. IMPRESSION: MRI head: No acute finding. 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 Moderate chronic small-vessel ischemic changes affecting the cerebral hemispheres as seen previously. MRA head: No change. Atherosclerotic change in both carotid siphon regions without flow limiting stenosis. No intracranial large or medium vessel anterior circulation abnormal finding. Small right vertebral artery which gives flow to right PICA but does not contribute to the basilar. Dominant left vertebral artery widely patent to the basilar. Atherosclerotic irregularity of the basilar artery without flow limiting stenosis. Electronically Signed   By: Paulina Fusi M.D.   On: 06/05/2016 07:43   Ct Head Code Stroke W/o Cm  Result Date: 06/04/2016 CLINICAL DATA:  Code stroke. Headache, confusion, speech difficulty beginning at 3 p.m. Status post LEFT carotid stent 1 week ago with subsequent foot numbness. History of hypertension, LEFT eye blindness, stroke. EXAM: CT HEAD WITHOUT CONTRAST TECHNIQUE: Contiguous axial images were obtained from the base of the skull through the vertex without intravenous contrast. COMPARISON:  MRI head and CTA HEAD April 18, 2016 FINDINGS: BRAIN: Stable moderate to severe ventriculomegaly on the basis of global parenchymal brain volume loss. Old RIGHT cerebellar infarct. No intraparenchymal hemorrhage, mass effect, midline shift or acute large vascular territory infarct. Moderate to severe stable ventriculomegaly on the basis of global parenchymal brain volume loss. No abnormal extra-axial fluid collections. Basal cisterns are patent. VASCULAR: Moderate calcific atherosclerosis of the carotid siphons. SKULL: No skull fracture. No significant scalp soft tissue swelling. SINUSES/ORBITS: The mastoid air-cells and  included paranasal sinuses are well-aerated.Status post LEFT ocular lens implant. The included ocular globes and orbital contents are non-suspicious. OTHER: None. ASPECTS [facility] Stroke Program Early CT Score) - Ganglionic level infarction (caudate, lentiform nuclei, internal capsule, insula, M1-M3 cortex): 7 - Supraganglionic infarction (M4-M6 cortex): 3 Total score (0-10 with 10 being normal): 10 IMPRESSION: 1. No acute intracranial process. Stable examination including mild to moderate chronic small vessel ischemic disease and old small RIGHT cerebellar infarct. 2. ASPECTS is 10. Critical Value/emergent results were called by telephone at the time of interpretation on 06/04/2016 at 6:57 pm to Dr. Samuel Jester , who verbally acknowledged these results. Electronically Signed   By: Pernell Dupre  Bloomer M.D.   On: 06/04/2016 18:59    EKG & Cardiac Imaging    EKG: NSR, HR 92, with slight ST depression along inferior and lateral leads (simialr to prior tracings). - Personally Reviewed  Echocardiogram: 04/19/2016 Study Conclusions  - Left ventricle: The cavity size was normal. Wall thickness was   increased in a pattern of mild LVH. Systolic function was normal.   The estimated ejection fraction was in the range of 60% to 65%.   Wall motion was normal; there were no regional wall motion   abnormalities. The study is not technically sufficient to allow   evaluation of LV diastolic function. - Aortic valve: Mildly calcified annulus. Trileaflet. - Mitral valve: Calcified annulus. - Atrial septum: No defect or patent foramen ovale was identified. - Tricuspid valve: There was trivial regurgitation. - Pulmonary arteries: Systolic pressure could not be accurately   estimated. - Pericardium, extracardiac: A prominent pericardial fat pad was   present.  Impressions:  - Mild LVH with LVEF 60-65%. Indeterminate diastolic function.   Mildly calcified mitral regurgitation. Trivial tricuspid    regurgitation. Unable to assess PASP. No obvious PFO or ASD.  Assessment & Plan   1. Elevated Troponin - presented with a headache, slurred speech and lower extremity numbness. Unclear why a Troponin was initially checked as she denies any recent chest discomfort or dyspnea with exertion.  - echo in 03/2016 showed a preserved EF of 60-65%, no WMA, trivial TR, and a prominent pericardiac fat pad. - Initial troponin at 3.32, with repeat values at 2.93, 3.12, and 2.55. EKG shows NSR, HR 92, with slight ST depression along inferior and lateral leads (similar to prior tracings).   - will obtain a limited echo to assess LV function and wall motion. Would prefer noninvasive options with her recent TIA's. If echo normal, consider outpatient Lexiscan Myoview.  2. Carotid Artery Disease - underwent recent left carotid endarterectomy on 05/29/2016. - repeat carotid doppler results are pending.   3. HTN - BP at 109/68 - 163/107 in the past 24 hours.  - was on Lisinopril-HCTZ 10-12.5mg  daily prior to admission. This has been held and she has since been started on Lopressor 25mg  BID in the setting of her elevated troponin.   4. HLD - Lipid Panel this admission shows total cholesterol 216, HDL 36, and LDL 154. - started on Pravastatin 20mg  daily.   5. TIA/ Prior CVA - CT Imaging showed no acute intracranial processes with stable examination of the mild to moderate small vessel disease and an old small right cerebellar infarct. MRI with similar findings.  - Neurology following.   Signed, Ellsworth Lennox, PA-C 06/05/2016, 1:07 PM Pager: [phone]  I have seen and examined the patient along with Ellsworth Lennox, PA-C.  I have reviewed the chart, notes and new data.  I agree with PA's note.  Key new complaints: firmly denies experiencing any chest pain or discomfort Key examination changes: normal CV exam Key new findings / data: ECG has subtle ST abnormalities, but no Q waves and is not  changed from preop ECG last week. Troponin is elevated in a variable "plateau" pattern  PLAN: Sizeable, but atypical troponin elevation is difficult to explain in the absence of chest symptoms or ECG changes. No record of previous troponin assay except for this admission. It may be artefactual. It may represent the "tail-end" of a myocardial infarction that occurred perioperatively when she had her CEA. Neither explanation is very satisfactory.  Will check a  limited echo for wall motion. If abnormalities are seen, she should undergo angiography tomorrow. Otherwise, schedule for outpatient stress Myoview and commit to angiography only if there is a large ischemic defect. Either way she should be on ASA, statin and low dose beta blocker.   She has been intolerant to more potent statins and pravastatin was chosen currently. She has established vascular disease and it is unlikely that the low dose of pravastatin will provide sufficient LDL reduction. Will probably need to discuss PCSK9 inhibitors.  Thurmon Fair, MD, [facility] [phone] 06/05/2016, 3:25 PM

## 2016-06-06 ENCOUNTER — Observation Stay (HOSPITAL_COMMUNITY): Payer: Medicare HMO

## 2016-06-06 DIAGNOSIS — R202 Paresthesia of skin: Secondary | ICD-10-CM | POA: Diagnosis not present

## 2016-06-06 DIAGNOSIS — I214 Non-ST elevation (NSTEMI) myocardial infarction: Principal | ICD-10-CM

## 2016-06-06 LAB — ECHOCARDIOGRAM LIMITED
Height: 55 in
Weight: 2934.76 oz

## 2016-06-06 LAB — HEMOGLOBIN A1C
Hgb A1c MFr Bld: 5.4 % (ref 4.8–5.6)
MEAN PLASMA GLUCOSE: 108 mg/dL

## 2016-06-06 MED ORDER — PERFLUTREN LIPID MICROSPHERE
1.0000 mL | INTRAVENOUS | Status: AC | PRN
  Administered 2016-06-06: 2 mL via INTRAVENOUS
  Filled 2016-06-06: qty 10

## 2016-06-06 MED ORDER — PERFLUTREN LIPID MICROSPHERE
INTRAVENOUS | Status: AC
  Filled 2016-06-06: qty 10

## 2016-06-06 NOTE — Progress Notes (Signed)
PROGRESS NOTE    Sheryl Carter  ZOX:[id number] DOB: 07-02-1940 DOA: 06/04/2016 PCP: [name], MD Brief Narrative:Sheryl Carter  is a 76 y.o. female, With a history of recent left carotid endarterectomy on 05/29/2016, hypertension, hyperlipidemia, blindness in left eye, glaucoma was brought to the hospital after patient had sudden speech impairment, bilateral feet numbness and a headache.In the ED lab work showed elevated troponin 3.32, CT head unremarkable. Ruled out for CVA, undergoing cardiac eval for elevated troponin/abnormal ECHO  Assessment & Plan:   1.Complicated migraine vs anxiety -MRI negative for Acute CVA -symptoms resolved -appreciate Neuro input, EEG negative for epileptiform actvity  2. Elevated Troponin, NSTEMI vs Demand ischemia -peak of 3.3, trended down to 2.5, EKG with Q waves in inf leads -no chest pain, pt thinks she had a panic attack 3/11 afternoon -Cards consulted, 2D ECHO with EF of 50-55% and inferior hypokinesis and Q waves in lead 3 I think she needs a left heart cath, await cardiac input -continue ASA/statin, metoprolol  3. recent left carotid endarterectomy on 05/29/2016 -continue ASA and statin, MRI without acute findings, carotid duplex with 1-39% stenosis  4. Glaucoma -continue eye drops  5. Anxiety -continue xanax PRN  Code status: DNR Family Communication:daughter at bedside 3/12 Dispo: home pending above workup  Consultants:  Neurology Cards  Subjective: Feels well, no chest pain, no further slurring or numbness, had some discomfort/like panic attack last pm  Objective: Vitals:   06/05/16 2303 06/06/16 0350 06/06/16 0804 06/06/16 0912  BP: (!) 146/76 (!) 141/70 (!) 138/95   Pulse: 64 64 64 68  Resp: 18 18 15    Temp: 98.2 F (36.8 C) 97.7 F (36.5 C) 98 F (36.7 C)   TempSrc: Oral Oral Oral   SpO2: 96% 96% 100%   Weight:      Height:        Intake/Output Summary (Last 24 hours) at 06/06/16 1146 Last data  filed at 06/06/16 0804  Gross per 24 hour  Intake              240 ml  Output              450 ml  Net             -210 ml   Filed Weights   06/04/16 1824 06/05/16 0125  Weight: 83 kg (183 lb) 83.2 kg (183 lb 6.8 oz)    Examination:  General exam: Obese, AAOx3 HEENT: L neck incision dry Respiratory system: Clear to auscultation. Respiratory effort normal. Cardiovascular system: S1 & S2 heard, RRR. No JVD,  Gastrointestinal system: Abdomen is nondistended, soft and nontender. Normal bowel sounds heard. Central nervous system: Alert and oriented. No focal neurological deficits. Extremities: Symmetric 5 x 5 power. Skin: No rashes, lesions or ulcers Psychiatry: Judgement and insight appear normal. Mood & affect appropriate.     Data Reviewed: I have personally reviewed following labs and imaging studies  CBC:  Recent Labs Lab 06/04/16 1834 06/04/16 1840 06/05/16 0222  WBC 7.2  --  5.9  NEUTROABS 3.6  --   --   HGB 12.7 12.2 11.8*  HCT 36.8 36.0 35.6*  MCV 91.5  --  91.5  PLT 290  --  251   Basic Metabolic Panel:  Recent Labs Lab 06/04/16 1834 06/04/16 1840 06/05/16 0222  NA 137 138  --   K 3.7 3.7  --   CL 100* 99*  --   CO2 30  --   --  GLUCOSE 114* 114*  --   BUN 8 5*  --   CREATININE 0.87 0.90 0.81  CALCIUM 9.5  --   --    GFR: Estimated Creatinine Clearance: 50.1 mL/min (by C-G formula based on SCr of 0.81 mg/dL). Liver Function Tests:  Recent Labs Lab 06/04/16 1834  AST 28  ALT 17  ALKPHOS 65  BILITOT 0.4  PROT 6.8  ALBUMIN 3.4*   No results for input(s): LIPASE, AMYLASE in the last 168 hours. No results for input(s): AMMONIA in the last 168 hours. Coagulation Profile:  Recent Labs Lab 06/04/16 1834  INR 0.94   Cardiac Enzymes:  Recent Labs Lab 06/05/16 0222 06/05/16 0806 06/05/16 1534  TROPONINI 3.12* 2.55* 2.19*   BNP (last 3 results) No results for input(s): PROBNP in the last 8760 hours. HbA1C:  Recent Labs   06/05/16 0222  HGBA1C 5.4   CBG:  Recent Labs Lab 06/04/16 1829 06/05/16 1850  GLUCAP 116* 113*   Lipid Profile:  Recent Labs  06/05/16 0222  CHOL 216*  HDL 36*  LDLCALC 154*  TRIG 128  CHOLHDL 6.0   Thyroid Function Tests: No results for input(s): TSH, T4TOTAL, FREET4, T3FREE, THYROIDAB in the last 72 hours. Anemia Panel: No results for input(s): VITAMINB12, FOLATE, FERRITIN, TIBC, IRON, RETICCTPCT in the last 72 hours. Urine analysis:    Component Value Date/Time   COLORURINE COLORLESS (A) 06/04/2016 1909   APPEARANCEUR CLEAR 06/04/2016 1909   LABSPEC 1.002 (L) 06/04/2016 1909   PHURINE 8.0 06/04/2016 1909   GLUCOSEU NEGATIVE 06/04/2016 1909   HGBUR NEGATIVE 06/04/2016 1909   BILIRUBINUR NEGATIVE 06/04/2016 1909   KETONESUR NEGATIVE 06/04/2016 1909   PROTEINUR NEGATIVE 06/04/2016 1909   NITRITE NEGATIVE 06/04/2016 1909   LEUKOCYTESUR NEGATIVE 06/04/2016 1909   Sepsis Labs: @LABRCNTIP (procalcitonin:4,lacticidven:4)  )No results found for this or any previous visit (from the past 240 hour(s)).       Radiology Studies: Mr Brain 49 Contrast  Result Date: 06/05/2016 CLINICAL DATA:  Acute presentation with headache, confusion and speech disturbance. Numbness in the feet. EXAM: MRI HEAD WITHOUT CONTRAST MRA HEAD WITHOUT CONTRAST TECHNIQUE: Multiplanar, multiecho pulse sequences of the brain and surrounding structures were obtained without intravenous contrast. Angiographic images of the head were obtained using MRA technique without contrast. COMPARISON:  CT 06/04/2016.  MRI 04/18/2016. FINDINGS: MRI HEAD FINDINGS Brain: Diffusion imaging does not show any acute or subacute infarction. The brainstem is normal. There is an old infarction in the inferior cerebellum on the right which is unchanged. Cerebral hemispheres show age related atrophy with moderate chronic small-vessel ischemic changes affecting the deep and subcortical white matter. No mass lesion, hemorrhage,  hydrocephalus or extra-axial collection. No visible change since the previous study. Vascular: Major vessels at the base of the brain show flow. Skull and upper cervical spine: Negative Sinuses/Orbits: Clear/ normal. Small amount of mastoid air cell fluid. Other: None significant MRA HEAD FINDINGS Both internal carotid artery's are patent through the skullbase and siphon regions. There is considerable atherosclerotic irregularity in the carotid siphon regions but without suspicion of flow limiting stenosis. Supraclinoid internal carotid arteries are normal. Anterior and middle cerebral arteries are widely patent. Fetal origin left PCA. Left vertebral artery is dominant. Small right vertebral artery show antegrade flow to PICA up. No flow an the right vertebral artery beyond PICA. Basilar artery shows some atherosclerotic irregularity without flow limiting stenosis. Anterior inferior cerebellar arteries, superior cerebellar arteries and posterior cerebral arteries are patent bilaterally. Left PCA takes fetal origin  as noted previously. IMPRESSION: MRI head: No acute finding. 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 Moderate chronic small-vessel ischemic changes affecting the cerebral hemispheres as seen previously. MRA head: No change. Atherosclerotic change in both carotid siphon regions without flow limiting stenosis. No intracranial large or medium vessel anterior circulation abnormal finding. Small right vertebral artery which gives flow to right PICA but does not contribute to the basilar. Dominant left vertebral artery widely patent to the basilar. Atherosclerotic irregularity of the basilar artery without flow limiting stenosis. Electronically Signed   By: Paulina FusiMark  Shogry M.D.   On: 06/05/2016 07:43   Dg Chest Port 1 View  Result Date: 06/04/2016 CLINICAL DATA:  Feet numbness after LEFT carotid artery stenting. Headache and confusion. EXAM: PORTABLE CHEST 1 VIEW COMPARISON:  Chest radiograph Aug 17, 2004 FINDINGS: Cardiomediastinal silhouette is unremarkable for this low inspiratory examination with crowded vasculature markings. The lungs are clear without pleural effusions or focal consolidations. Trachea projects midline and there is no pneumothorax. Included soft tissue planes and osseous structures are non-suspicious. IMPRESSION: No acute cardiopulmonary process for this low inspiratory portable examination. Electronically Signed   By: Awilda Metroourtnay  Bloomer M.D.   On: 06/04/2016 19:21   Mr Maxine GlennMra Head/brain ZOWo Cm  Result Date: 06/05/2016 CLINICAL DATA:  Acute presentation with headache, confusion and speech disturbance. Numbness in the feet. EXAM: MRI HEAD WITHOUT CONTRAST MRA HEAD WITHOUT CONTRAST TECHNIQUE: Multiplanar, multiecho pulse sequences of the brain and surrounding structures were obtained without intravenous contrast. Angiographic images of the head were obtained using MRA technique without contrast. COMPARISON:  CT 06/04/2016.  MRI 04/18/2016. FINDINGS: MRI HEAD FINDINGS Brain: Diffusion imaging does not show any acute or subacute infarction. The brainstem is normal. There is an old infarction in the inferior cerebellum on the right which is unchanged. Cerebral hemispheres show age related atrophy with moderate chronic small-vessel ischemic changes affecting the deep and subcortical white matter. No mass lesion, hemorrhage, hydrocephalus or extra-axial collection. No visible change since the previous study. Vascular: Major vessels at the base of the brain show flow. Skull and upper cervical spine: Negative Sinuses/Orbits: Clear/ normal. Small amount of mastoid air cell fluid. Other: None significant MRA HEAD FINDINGS Both internal carotid artery's are patent through the skullbase and siphon regions. There is considerable atherosclerotic irregularity in the carotid siphon regions but without suspicion of flow limiting stenosis. Supraclinoid internal carotid arteries are normal. Anterior and middle  cerebral arteries are widely patent. Fetal origin left PCA. Left vertebral artery is dominant. Small right vertebral artery show antegrade flow to PICA up. No flow an the right vertebral artery beyond PICA. Basilar artery shows some atherosclerotic irregularity without flow limiting stenosis. Anterior inferior cerebellar arteries, superior cerebellar arteries and posterior cerebral arteries are patent bilaterally. Left PCA takes fetal origin as noted previously. IMPRESSION: MRI head: No acute finding. 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 Moderate chronic small-vessel ischemic changes affecting the cerebral hemispheres as seen previously. MRA head: No change. Atherosclerotic change in both carotid siphon regions without flow limiting stenosis. No intracranial large or medium vessel anterior circulation abnormal finding. Small right vertebral artery which gives flow to right PICA but does not contribute to the basilar. Dominant left vertebral artery widely patent to the basilar. Atherosclerotic irregularity of the basilar artery without flow limiting stenosis. Electronically Signed   By: Paulina FusiMark  Shogry M.D.   On: 06/05/2016 07:43   Ct Head Code Stroke W/o Cm  Result Date: 06/04/2016 CLINICAL DATA:  Code stroke. Headache, confusion,  speech difficulty beginning at 3 p.m. Status post LEFT carotid stent 1 week ago with subsequent foot numbness. History of hypertension, LEFT eye blindness, stroke. EXAM: CT HEAD WITHOUT CONTRAST TECHNIQUE: Contiguous axial images were obtained from the base of the skull through the vertex without intravenous contrast. COMPARISON:  MRI head and CTA HEAD April 18, 2016 FINDINGS: BRAIN: Stable moderate to severe ventriculomegaly on the basis of global parenchymal brain volume loss. Old RIGHT cerebellar infarct. No intraparenchymal hemorrhage, mass effect, midline shift or acute large vascular territory infarct. Moderate to severe stable ventriculomegaly on the basis of global  parenchymal brain volume loss. No abnormal extra-axial fluid collections. Basal cisterns are patent. VASCULAR: Moderate calcific atherosclerosis of the carotid siphons. SKULL: No skull fracture. No significant scalp soft tissue swelling. SINUSES/ORBITS: The mastoid air-cells and included paranasal sinuses are well-aerated.Status post LEFT ocular lens implant. The included ocular globes and orbital contents are non-suspicious. OTHER: None. ASPECTS [facility] Stroke Program Early CT Score) - Ganglionic level infarction (caudate, lentiform nuclei, internal capsule, insula, M1-M3 cortex): 7 - Supraganglionic infarction (M4-M6 cortex): 3 Total score (0-10 with 10 being normal): 10 IMPRESSION: 1. No acute intracranial process. Stable examination including mild to moderate chronic small vessel ischemic disease and old small RIGHT cerebellar infarct. 2. ASPECTS is 10. Critical Value/emergent results were called by telephone at the time of interpretation on 06/04/2016 at 6:57 pm to Dr. Samuel Jester , who verbally acknowledged these results. Electronically Signed   By: Awilda Metro M.D.   On: 06/04/2016 18:59        Scheduled Meds: . ALPRAZolam  0.125 mg Oral BID  . aspirin EC  325 mg Oral Daily  . enoxaparin (LOVENOX) injection  40 mg Subcutaneous Q24H  . metoprolol tartrate  25 mg Oral BID  . pantoprazole  40 mg Oral Daily  . pravastatin  20 mg Oral q1800  . timolol  1 drop Right Eye BID   Continuous Infusions: . sodium chloride       LOS: 0 days    Time spent:    Zannie Cove, MD [facility] Pager [phone]  If 7PM-7AM, please contact night-coverage [email or web address] Password [facility] 06/06/2016, 11:46 AM

## 2016-06-06 NOTE — Plan of Care (Signed)
Problem: Safety: Goal: Ability to remain free from injury will improve Outcome: Progressing Pt uses call bell and bed alarm is set to prevent fall

## 2016-06-06 NOTE — Care Management Obs Status (Signed)
MEDICARE OBSERVATION STATUS NOTIFICATION   Patient Details  Name: Sheryl ChanceShirley M Doucette MRN: [id number] Date of Birth: 03/17/1941   Medicare Observation Status Notification Given:  Yes    Leone Havenaylor, Aubryanna Nesheim [place], RN 06/06/2016, 12:53 PM

## 2016-06-06 NOTE — Progress Notes (Signed)
Progress Note  Patient Name: Sheryl Carter Date of Encounter: 06/06/2016  Primary Cardiologist: Follow-Up in [place], [place]   Subjective   Denies any chest pain or dyspnea on exertion. Had episode of "slow speech" overnight.   Inpatient Medications    Scheduled Meds: . ALPRAZolam  0.125 mg Oral BID  . aspirin EC  325 mg Oral Daily  . enoxaparin (LOVENOX) injection  40 mg Subcutaneous Q24H  . metoprolol tartrate  25 mg Oral BID  . pantoprazole  40 mg Oral Daily  . pravastatin  20 mg Oral q1800  . timolol  1 drop Right Eye BID   Continuous Infusions: . sodium chloride     PRN Meds: oxyCODONE-acetaminophen, senna-docusate, witch hazel-glycerin   Vital Signs    Vitals:   06/05/16 2303 06/06/16 0350 06/06/16 0804 06/06/16 0912  BP: (!) 146/76 (!) 141/70 (!) 138/95   Pulse: 64 64 64 68  Resp: 18 18 15    Temp: 98.2 F (36.8 C) 97.7 F (36.5 C) 98 F (36.7 C)   TempSrc: Oral Oral Oral   SpO2: 96% 96% 100%   Weight:      Height:        Intake/Output Summary (Last 24 hours) at 06/06/16 0939 Last data filed at 06/06/16 0804  Gross per 24 hour  Intake              240 ml  Output              450 ml  Net             -210 ml   Filed Weights   06/04/16 1824 06/05/16 0125  Weight: 183 lb (83 kg) 183 lb 6.8 oz (83.2 kg)    Telemetry    NSR, HR in 60's - 80's. No ectopic events.  - Personally Reviewed  ECG    06/04/2016: NSR, HR 92, with slight ST depression along inferior and lateral leads (simialr to prior tracings).  - Personally Reviewed  Physical Exam   General: Well developed, well nourished Caucasian female appearing in no acute distress. Head: Normocephalic, atraumatic.  Neck: Supple without bruits, JVD not elevated. Lungs:  Resp regular and unlabored, CTA without wheezing or rales. Heart: RRR, S1, S2, no S3, S4, or murmur; no rub. Abdomen: Soft, non-tender, non-distended with normoactive bowel sounds. No hepatomegaly. No rebound/guarding. No  obvious abdominal masses. Extremities: No clubbing, cyanosis, or edema. Distal pedal pulses are 2+ bilaterally. Neuro: Alert and oriented X 3. Moves all extremities spontaneously. Psych: Normal affect.  Labs    Chemistry Recent Labs Lab 06/04/16 1834 06/04/16 1840 06/05/16 0222  NA 137 138  --   K 3.7 3.7  --   CL 100* 99*  --   CO2 30  --   --   GLUCOSE 114* 114*  --   BUN 8 5*  --   CREATININE 0.87 0.90 0.81  CALCIUM 9.5  --   --   PROT 6.8  --   --   ALBUMIN 3.4*  --   --   AST 28  --   --   ALT 17  --   --   ALKPHOS 65  --   --   BILITOT 0.4  --   --   GFRNONAA >60  --  >60  GFRAA >60  --  >60  ANIONGAP 7  --   --      Hematology Recent Labs Lab 06/04/16 1834 06/04/16 1840 06/05/16 0222  WBC 7.2  --  5.9  RBC 4.02  --  3.89  HGB 12.7 12.2 11.8*  HCT 36.8 36.0 35.6*  MCV 91.5  --  91.5  MCH 31.6  --  30.3  MCHC 34.5  --  33.1  RDW 12.5  --  12.7  PLT 290  --  251    Cardiac Enzymes Recent Labs Lab 06/05/16 0222 06/05/16 0806 06/05/16 1534  TROPONINI 3.12* 2.55* 2.19*    Recent Labs Lab 06/04/16 1839 06/04/16 2033  TROPIPOC 3.32* 2.93*     BNPNo results for input(s): BNP, PROBNP in the last 168 hours.   DDimer No results for input(s): DDIMER in the last 168 hours.   Radiology    [name]  Result Date: 06/05/2016 CLINICAL DATA:  Acute presentation with headache, confusion and speech disturbance. Numbness in the feet. EXAM: MRI HEAD WITHOUT CONTRAST MRA HEAD WITHOUT CONTRAST TECHNIQUE: Multiplanar, multiecho pulse sequences of the brain and surrounding structures were obtained without intravenous contrast. Angiographic images of the head were obtained using MRA technique without contrast. COMPARISON:  CT 06/04/2016.  MRI 04/18/2016. FINDINGS: MRI HEAD FINDINGS Brain: Diffusion imaging does not show any acute or subacute infarction. The brainstem is normal. There is an old infarction in the inferior cerebellum on the right which is  unchanged. Cerebral hemispheres show age related atrophy with moderate chronic small-vessel ischemic changes affecting the deep and subcortical white matter. No mass lesion, hemorrhage, hydrocephalus or extra-axial collection. No visible change since the previous study. Vascular: Major vessels at the base of the brain show flow. Skull and upper cervical spine: Negative Sinuses/Orbits: Clear/ normal. Small amount of mastoid air cell fluid. Other: None significant MRA HEAD FINDINGS Both internal carotid artery's are patent through the skullbase and siphon regions. There is considerable atherosclerotic irregularity in the carotid siphon regions but without suspicion of flow limiting stenosis. Supraclinoid internal carotid arteries are normal. Anterior and middle cerebral arteries are widely patent. Fetal origin left PCA. Left vertebral artery is dominant. Small right vertebral artery show antegrade flow to PICA up. No flow an the right vertebral artery beyond PICA. Basilar artery shows some atherosclerotic irregularity without flow limiting stenosis. Anterior inferior cerebellar arteries, superior cerebellar arteries and posterior cerebral arteries are patent bilaterally. Left PCA takes fetal origin as noted previously. IMPRESSION: MRI head: No acute finding. 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 Moderate chronic small-vessel ischemic changes affecting the cerebral hemispheres as seen previously. MRA head: No change. Atherosclerotic change in both carotid siphon regions without flow limiting stenosis. No intracranial large or medium vessel anterior circulation abnormal finding. Small right vertebral artery which gives flow to right PICA but does not contribute to the basilar. Dominant left vertebral artery widely patent to the basilar. Atherosclerotic irregularity of the basilar artery without flow limiting stenosis. Electronically Signed   By: Paulina Fusi M.D.   On: 06/05/2016 07:43   Dg Chest Port 1  View  Result Date: 06/04/2016 CLINICAL DATA:  Feet numbness after LEFT carotid artery stenting. Headache and confusion. EXAM: PORTABLE CHEST 1 VIEW COMPARISON:  Chest radiograph Aug 17, 2004 FINDINGS: Cardiomediastinal silhouette is unremarkable for this low inspiratory examination with crowded vasculature markings. The lungs are clear without pleural effusions or focal consolidations. Trachea projects midline and there is no pneumothorax. Included soft tissue planes and osseous structures are non-suspicious. IMPRESSION: No acute cardiopulmonary process for this low inspiratory portable examination. Electronically Signed   By: Awilda Metro M.D.   On: 06/04/2016 19:21  [name] Head/brain ZO Cm  Result Date: 06/05/2016 CLINICAL DATA:  Acute presentation with headache, confusion and speech disturbance. Numbness in the feet. EXAM: MRI HEAD WITHOUT CONTRAST MRA HEAD WITHOUT CONTRAST TECHNIQUE: Multiplanar, multiecho pulse sequences of the brain and surrounding structures were obtained without intravenous contrast. Angiographic images of the head were obtained using MRA technique without contrast. COMPARISON:  CT 06/04/2016.  MRI 04/18/2016. FINDINGS: MRI HEAD FINDINGS Brain: Diffusion imaging does not show any acute or subacute infarction. The brainstem is normal. There is an old infarction in the inferior cerebellum on the right which is unchanged. Cerebral hemispheres show age related atrophy with moderate chronic small-vessel ischemic changes affecting the deep and subcortical white matter. No mass lesion, hemorrhage, hydrocephalus or extra-axial collection. No visible change since the previous study. Vascular: Major vessels at the base of the brain show flow. Skull and upper cervical spine: Negative Sinuses/Orbits: Clear/ normal. Small amount of mastoid air cell fluid. Other: None significant MRA HEAD FINDINGS Both internal carotid artery's are patent through the skullbase and siphon regions. There is  considerable atherosclerotic irregularity in the carotid siphon regions but without suspicion of flow limiting stenosis. Supraclinoid internal carotid arteries are normal. Anterior and middle cerebral arteries are widely patent. Fetal origin left PCA. Left vertebral artery is dominant. Small right vertebral artery show antegrade flow to PICA up. No flow an the right vertebral artery beyond PICA. Basilar artery shows some atherosclerotic irregularity without flow limiting stenosis. Anterior inferior cerebellar arteries, superior cerebellar arteries and posterior cerebral arteries are patent bilaterally. Left PCA takes fetal origin as noted previously. IMPRESSION: MRI head: No acute finding. 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 Moderate chronic small-vessel ischemic changes affecting the cerebral hemispheres as seen previously. MRA head: No change. Atherosclerotic change in both carotid siphon regions without flow limiting stenosis. No intracranial large or medium vessel anterior circulation abnormal finding. Small right vertebral artery which gives flow to right PICA but does not contribute to the basilar. Dominant left vertebral artery widely patent to the basilar. Atherosclerotic irregularity of the basilar artery without flow limiting stenosis. Electronically Signed   By: Paulina Fusi M.D.   On: 06/05/2016 07:43   Ct Head Code Stroke W/o Cm  Result Date: 06/04/2016 CLINICAL DATA:  Code stroke. Headache, confusion, speech difficulty beginning at 3 p.m. Status post LEFT carotid stent 1 week ago with subsequent foot numbness. History of hypertension, LEFT eye blindness, stroke. EXAM: CT HEAD WITHOUT CONTRAST TECHNIQUE: Contiguous axial images were obtained from the base of the skull through the vertex without intravenous contrast. COMPARISON:  MRI head and CTA HEAD April 18, 2016 FINDINGS: BRAIN: Stable moderate to severe ventriculomegaly on the basis of global parenchymal brain volume loss. Old  RIGHT cerebellar infarct. No intraparenchymal hemorrhage, mass effect, midline shift or acute large vascular territory infarct. Moderate to severe stable ventriculomegaly on the basis of global parenchymal brain volume loss. No abnormal extra-axial fluid collections. Basal cisterns are patent. VASCULAR: Moderate calcific atherosclerosis of the carotid siphons. SKULL: No skull fracture. No significant scalp soft tissue swelling. SINUSES/ORBITS: The mastoid air-cells and included paranasal sinuses are well-aerated.Status post LEFT ocular lens implant. The included ocular globes and orbital contents are non-suspicious. OTHER: None. ASPECTS [facility] Stroke Program Early CT Score) - Ganglionic level infarction (caudate, lentiform nuclei, internal capsule, insula, M1-M3 cortex): 7 - Supraganglionic infarction (M4-M6 cortex): 3 Total score (0-10 with 10 being normal): 10 IMPRESSION: 1. No acute intracranial process. Stable examination including mild to moderate chronic small vessel  ischemic disease and old small RIGHT cerebellar infarct. 2. ASPECTS is 10. Critical Value/emergent results were called by telephone at the time of interpretation on 06/04/2016 at 6:57 pm to Dr. Samuel Jester , who verbally acknowledged these results. Electronically Signed   By: Awilda Metro M.D.   On: 06/04/2016 18:59    Cardiac Studies   Echocardiogram: 04/19/2016 Study Conclusions  - Left ventricle: The cavity size was normal. Wall thickness was increased in a pattern of mild LVH. Systolic function was normal. The estimated ejection fraction was in the range of 60% to 65%. Wall motion was normal; there were no regional wall motion abnormalities. The study is not technically sufficient to allow evaluation of LV diastolic function. - Aortic valve: Mildly calcified annulus. Trileaflet. - Mitral valve: Calcified annulus. - Atrial septum: No defect or patent foramen ovale was identified. - Tricuspid valve: There  was trivial regurgitation. - Pulmonary arteries: Systolic pressure could not be accurately estimated. - Pericardium, extracardiac: A prominent pericardial fat pad was present.  Impressions:  - Mild LVH with LVEF 60-65%. Indeterminate diastolic function. Mildly calcified mitral regurgitation. Trivial tricuspid regurgitation. Unable to assess PASP. No obvious PFO or ASD.  Limited Echo: 06/05/2016  Study Conclusions  - Left ventricle: Systolic function was normal. The estimated   ejection fraction was in the range of 50% to 55%. Possible   hypokinesis of the inferior myocardium. - Mitral valve: Calcified annulus.  Impressions:  - Consider Definity contrast study.  Patient Profile     76 y.o. female w/ PMH of HTN, HLD, Glaucoma, and recent left carotid endarterectomy (05/29/2016) admitted for possible TIA. Cards consulted for elevated troponin.   Assessment & Plan     1. Elevated Troponin/ Abnormal Echocardiogram - presented with a headache, slurred speech and lower extremity numbness. Unclear why a Troponin was initially checked as she denies any recent chest discomfort or dyspnea with exertion.  - echo in 03/2016 showed a preserved EF of 60-65%, no WMA, trivial TR, and a prominent pericardiac fat pad. - Initial troponin at 3.32, with repeat values at 2.93, 3.12, 2.55, and 2.19. EKG with NSR, HR 92, and slight ST depression along inferior and lateral leads (similar to prior tracings).   - limited echo shows a slightly reduced EF of 50-55% with possible hypokinesis of the inferior myocardium. With elevated troponin values and WMA on echo, an ischemic evaluation is warranted. Discussed further ischemic workup in detail with the patient including NST vs. cardiac catheterization. She favors a definitive evaluation with a cath. Reviewed the procedure including risks vs. benefits with the patient. She will discuss this further with her family today (I informed the nurse to page  if her family had any questions/concerns). Will also discuss further with Dr. Royann Shivers. She already consumed a full breakfast so the earliest the procedure could be is tomorrow.  - continue ASA, statin, and BB.   2. Carotid Artery Disease - underwent recent left carotid endarterectomy on 05/29/2016. - repeat carotid doppler study showed 1-39% stenosis bilaterally.   3. HTN - BP at 128/68 - 147/95 in the past 24 hours.  - was on Lisinopril-HCTZ 10-12.5mg  daily prior to admission. This has been held and she has since been started on Lopressor 25mg  BID in the setting of her elevated troponin.   4. HLD - Lipid Panel this admission shows total cholesterol 216, HDL 36, and LDL 154. - started on Pravastatin 20mg  daily. Intolerant to high-intensity statin therapy in the past.   5. TIA/ Prior  CVA - CT Imaging showed no acute intracranial processes with stable examination of the mild to moderate small vessel disease and an old small right cerebellar infarct. MRI with similar findings.  - Neurology following.    Lorri FrederickSigned, Brittany M Strader , PA-C 9:39 AM 06/06/2016 Pager: [phone]  I have seen and examined the patient along with Ellsworth LennoxBrittany M Strader , PA-C.  I have reviewed the chart, notes and new data.  I agree with PA's note.  Key new complaints: no angina Key examination changes: no signs of CHF, no arrhythmia, no new neuro deficits Key new findings / data: reviewed echo - there is indeed a suggestion of an inferior wall motion abnormality, but have asked for a repeat study with Definity.  PLAN: If wall motion abnormality is confirmed, recommend cardiac cath/coronary angio +/- PCI. These procedures were discussed in detail, including the risks and benefits and potential complications. This procedure has been fully reviewed with the patient and her daughter and written informed consent has been obtained.  Also reviewed the likely need to try PCSK9 inhibitors if she cannot tolerate statin  therapy that will allow LDL<70.   Thurmon FairMihai Jonae Renshaw, MD, [facility] [phone] 06/06/2016, 11:53 AM

## 2016-06-06 NOTE — Care Management Note (Signed)
Case Management Note  Patient Details  Name: Sheryl Carter MRN: [id number] Date of Birth: 01/06/1941  Subjective/Objective:   From home alone, pta indep, she will be staying with her daughter, Park MeoGeneva Robertson [phone] at dc for a while.  She has a [facility] [place], she has a cane and a rolling walker at home.  She has no problems getting her medications.  She will have trasnportation at dc. NCM will cont to follow for dc needs.                   Action/Plan:   Expected Discharge Date:                  Expected Discharge Plan:  Home/Self Care  In-House Referral:     Discharge planning Services  CM Consult  Post Acute Care Choice:    Choice offered to:     DME Arranged:    DME Agency:     HH Arranged:    HH Agency:     Status of Service:  In process, will continue to follow  If discussed at Long Length of Stay Meetings, dates discussed:    Additional Comments:  Leone Havenaylor, Anjela Cassara Clinton, RN 06/06/2016, 12:53 PM

## 2016-06-06 NOTE — Evaluation (Signed)
Occupational Therapy Evaluation and Discharge Patient Details Name: Sheryl Carter Halley MRN: [id number] DOB: 06/28/1940 Today's Date: 06/06/2016    History of Present Illness Sheryl Carter Picardi is an 76 y.o. female who [facility] with headaches, confusion, difficulty with speech and numbness in feet. She stated that her symptoms began after she had a left carotid endarterectomy on Monday. 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 Of note, troponin 3.32 in ED at [facility].    Clinical Impression   Pt performed showering, dressing and grooming modified independently. Pt sat for showering, but is declining a shower seat for home. No further OT needs.    Follow Up Recommendations  No OT follow up    Equipment Recommendations  None recommended by OT    Recommendations for Other Services       Precautions / Restrictions Precautions Precautions: None      Mobility Bed Mobility               General bed mobility comments: pt in chair  Transfers Overall transfer level: Modified independent Equipment used: Straight cane                  Balance Overall balance assessment: Needs assistance   Sitting balance-Leahy Scale: Normal       Standing balance-Leahy Scale: Fair                              ADL Overall ADL's : Modified independent                                       General ADL Comments: pt showered in sitting, performed standing grooming and dressed     Vision Baseline Vision/History: Wears glasses;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) Wears Glasses: At all times Patient Visual Report: No change from baseline       Perception     Praxis      Pertinent Vitals/Pain Pain Assessment: No/denies pain     Hand Dominance Right   Extremity/Trunk Assessment Upper Extremity Assessment Upper Extremity Assessment: Overall WFL for tasks assessed   Lower Extremity Assessment Lower Extremity Assessment: Defer to PT  evaluation       Communication Communication Communication: No difficulties   Cognition Arousal/Alertness: Awake/alert Behavior During Therapy: WFL for tasks assessed/performed Overall Cognitive Status: Within Functional Limits for tasks assessed                     General Comments       Exercises       Shoulder Instructions      Home Living Family/patient expects to be discharged to:: Private residence Living Arrangements: Alone Available Help at Discharge: Available PRN/intermittently (son and daughter) Type of Home: Mobile home Home Access: Ramped entrance     Home Layout: One level     Bathroom Shower/Tub: Producer, television/film/videoWalk-in shower   Bathroom Toilet: Standard     Home Equipment: Cane - single point;Walker - standard          Prior Functioning/Environment Level of Independence: Needs assistance  Gait / Transfers Assistance Needed: uses a cane ADL's / Homemaking Assistance Needed: daughter and son help with transportation, pt stands to shower, performs housekeeping            OT Problem List: Impaired balance (sitting and/or standing)  OT Treatment/Interventions:      OT Goals(Current goals can be found in the care plan section) Acute Rehab OT Goals Patient Stated Goal: to shower  OT Frequency:     Barriers to D/C:            Co-evaluation              End of Session Equipment Utilized During Treatment: Gait belt (cane) Nurse Communication: Mobility status  Activity Tolerance: Patient tolerated treatment well Patient left: in chair;with call bell/phone within reach  OT Visit Diagnosis: Other abnormalities of gait and mobility (R26.89)                ADL either performed or assessed with clinical judgement  Time: 5784-6962 OT Time Calculation (min): 54 min Charges:  OT General Charges $OT Visit: 1 Procedure OT Evaluation $OT Eval Moderate Complexity: 1 Procedure OT Treatments $Self Care/Home Management : 23-37 mins G-Codes:       Evern Bio 06/06/2016, 10:45 AM  [phone]

## 2016-06-06 NOTE — Plan of Care (Signed)
Problem: Education: Goal: Knowledge of secondary prevention will improve Outcome: Progressing Discussed signs and symptoms of stroke with patient. Went over [employer] FAST.   Problem: Coping: Goal: Ability to identify appropriate support needs will improve Outcome: Progressing Patient has family, specifically her daughter available.

## 2016-06-06 NOTE — Progress Notes (Signed)
Late entry due to missed g-code.   06/06/16 1200  OT G-codes **NOT FOR INPATIENT CLASS**  Functional Assessment Tool Used Clinical judgement  Functional Limitation Self care  Self Care Current Status [phone](G8987) CI  Self Care Discharge Status [phone](G8989) CI  06/06/2016 Martie RoundJulie Gerhard Rappaport, OTR/L Pager: [phone]

## 2016-06-06 NOTE — Progress Notes (Signed)
STROKE TEAM PROGRESS NOTE   SUBJECTIVE (INTERVAL HISTORY) Daughter is at at bedside. Pt neuro stable. Troponin level mildly high and flat out. Cardiology on board, pending TTE with contrast and possible cardiac cath. No complains. Daughter stated that pt can not tolerate two statins she tried before and prefer not to be put on statin at this time.    OBJECTIVE Temp:  [97.7 F (36.5 C)-99.1 F (37.3 C)] 98 F (36.7 C) (03/13 0804) Pulse Rate:  [64-82] 68 (03/13 0912) Cardiac Rhythm: Normal sinus rhythm (03/13 0804) Resp:  [15-24] 15 (03/13 0804) BP: (128-147)/(70-95) 138/95 (03/13 0804) SpO2:  [95 %-100 %] 100 % (03/13 0804)  CBC:   Recent Labs Lab 06/04/16 1834 06/04/16 1840 06/05/16 0222  WBC 7.2  --  5.9  NEUTROABS 3.6  --   --   HGB 12.7 12.2 11.8*  HCT 36.8 36.0 35.6*  MCV 91.5  --  91.5  PLT 290  --  251    Basic Metabolic Panel:   Recent Labs Lab 06/04/16 1834 06/04/16 1840 06/05/16 0222  NA 137 138  --   K 3.7 3.7  --   CL 100* 99*  --   CO2 30  --   --   GLUCOSE 114* 114*  --   BUN 8 5*  --   CREATININE 0.87 0.90 0.81  CALCIUM 9.5  --   --     Lipid Panel:     Component Value Date/Time   CHOL 216 (H) 06/05/2016 0222   TRIG 128 06/05/2016 0222   HDL 36 (L) 06/05/2016 0222   CHOLHDL 6.0 06/05/2016 0222   VLDL 26 06/05/2016 0222   LDLCALC 154 (H) 06/05/2016 0222   HgbA1c:  Lab Results  Component Value Date   HGBA1C 5.4 06/05/2016   Urine Drug Screen:     Component Value Date/Time   LABOPIA NONE DETECTED 06/04/2016 1909   COCAINSCRNUR NONE DETECTED 06/04/2016 1909   LABBENZ NONE DETECTED 06/04/2016 1909   AMPHETMU NONE DETECTED 06/04/2016 1909   THCU NONE DETECTED 06/04/2016 1909   LABBARB NONE DETECTED 06/04/2016 1909      IMAGING I have personally reviewed the radiological images below and agree with the radiology interpretations.  Ct Head Code Stroke W/o Cm 06/04/2016 1. No acute intracranial process. Stable examination including  mild to moderate chronic small vessel ischemic disease and old small RIGHT cerebellar infarct. 2. ASPECTS is 10.   [name] 06/05/2016 No acute finding. 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 Moderate chronic small-vessel ischemic changes affecting the cerebral hemispheres as seen previously.   [name] Head/brain Wo Cm 06/05/2016 No change. Atherosclerotic change in both carotid siphon regions without flow limiting stenosis. No intracranial large or medium vessel anterior circulation abnormal finding. Small right vertebral artery which gives flow to right PICA but does not contribute to the basilar. Dominant left vertebral artery widely patent to the basilar. Atherosclerotic irregularity of the basilar artery without flow limiting stenosis.   CUS - Findings are consistent with a 1-39 percent stenosis involving the right internal carotid artery and the left internal carotid artery. The vertebral arteries demonstrate antegrade flow.  TTE - Left ventricle: Systolic function was normal. The estimated   ejection fraction was in the range of 50% to 55%. Possible   hypokinesis of the inferior myocardium. - Mitral valve: Calcified annulus.   EEG - This awake and asleep EEG is normal except for excess amount of diffuse low voltage beta activity. Diffuse  low voltage beta activity is commonly seen with sedating medications such as benzodiazepines.  In the absence of sedating medications, anxiety and hyperthyroidism may produce generalized beta activity.  The absence of epileptiform discharges does not exclude a clinical diagnosis of epilepsy. Clinical correlation is advised.    PHYSICAL EXAM  Temp:  [97.7 F (36.5 C)-99.1 F (37.3 C)] 98 F (36.7 C) (03/13 0804) Pulse Rate:  [64-82] 68 (03/13 0912) Resp:  [15-24] 15 (03/13 0804) BP: (128-147)/(70-95) 138/95 (03/13 0804) SpO2:  [95 %-100 %] 100 % (03/13 0804)  General - Well nourished, well developed, in no apparent  distress.  Ophthalmologic - Fundi not visualized due to eye movement.  Cardiovascular - Regular rate and rhythm.  Mental Status -  Level of arousal and orientation to time, place, and person were intact. Language including expression, naming, repetition, comprehension was assessed and found intact. Attention span and concentration were impaired, not able to back spell WORLD and only calculate very simple math Fund of Knowledge was assessed and was intact.  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 III, IV, VI - Extraocular movements intact. V - Facial sensation intact bilaterally. VII - Facial movement intact bilaterally. VIII - Hearing & vestibular intact bilaterally. X - Palate elevates symmetrically. XI - Chin turning & shoulder shrug intact bilaterally. XII - Tongue protrusion intact.  Motor Strength - The patient's strength was normal in all extremities and pronator drift was absent.  Bulk was normal and fasciculations were absent.   Motor Tone - Muscle tone was assessed at the neck and appendages and was normal.  Reflexes - The patient's reflexes were 1+ in all extremities and she had no pathological reflexes.  Sensory - Light touch, temperature/pinprick were assessed and were symmetrical.    Coordination - The patient had normal movements in the hands and feet with no ataxia or dysmetria.  Tremor was absent.  Gait and Station - deferred.   ASSESSMENT/PLAN Ms. Sheryl ChanceShirley M Cuffie is a 76 y.o. female with history of L CEA 05/29/16, HTN, HLD, glaucoma, blindness in left eye, DVT and anxiety presenting with headaches, confusion, difficulty with speech and numbness in feet. She did not receive IV t-PA due to resolution of symptoms.   Complicated migraine vs. Anxiety, less likely seizure or TIA  Resultant  Neuro deficit resolved  Code Stroke CT no acute abnormality. small vessel disease. 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 Aspects  10  MRI  No acute stroke. small vessel disease. 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  Carotid Doppler unremarkable  2D Echo  EF 50-55%  EEG normal EEG  LDL 154  HgbA1c 5.4  Lovenox 40 mg sq daily for VTE prophylaxis Diet Heart Room service appropriate? Yes; Fluid consistency: Thin  aspirin 81 mg daily prior to admission, now on ASA 325mg . Continue ASA on discharge.  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 Arbie CookeyEarly 05/29/16, uneventful  Left neck incision site dry and clean  CUS unremarkable post CEA  Elevated troponine  Cardiology on board  troponine mildly high and flat  TTE EF 50-55%  Potential cardiac cath  Hypertension  Stable BP goal normotensive  Hyperlipidemia  Home meds:  No statin due to myalgia  LDL 154, goal < 70  Pt not able to tolerate 2 different statins in the past  Daughter would like hold off statin at this time.  Other Stroke Risk Factors  Advanced age  Morbid Obesity, Body mass index is 42.63 kg/m., recommend weight loss, diet and exercise as appropriate   ? Hx DVT  Other Active Problems  Glaucoma, Blind L eye  Elevated troponin 3.32->2.93->3.12->2.55 - trending down, EKG no ST-T elevation  Anxiety   Hospital day # 0  Neurology will sign off. Please call with questions. No neuro follow up needed at this time. Thanks for the consult.   Marvel Plan, MD PhD Stroke Neurology 06/06/2016 12:14 PM   To contact Stroke Continuity provider, please refer to  [email or web address]. After hours, contact General Neurology

## 2016-06-06 NOTE — Evaluation (Signed)
Physical Therapy Evaluation Patient Details Name: Sheryl Carter MRN: [id number] DOB: [birth year] Today's Date: 06/06/2016   History of Present Illness  Sheryl Carter is an 76 y.o. female who presented to [facility] with headaches, confusion, difficulty with speech and numbness in feet. She stated that her symptoms began after she had a left carotid endarterectomy on Monday. 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 Of note, troponin 3.32 in ED at [facility].   Clinical Impression  Patient seen for mobility assessment. Mobilizing well. VSS. No overt LOB or need for assist. At this time, patient at baseline mobility. No further acute PT needs, will sign off.    Follow Up Recommendations No PT follow up    Equipment Recommendations  None recommended by PT    Recommendations for Other Services       Precautions / Restrictions Precautions Precautions: None      Mobility  Bed Mobility               General bed mobility comments: pt in chair  Transfers Overall transfer level: Modified independent Equipment used: Straight cane                Ambulation/Gait Ambulation/Gait assistance: Modified independent (Device/Increase time) Ambulation Distance (Feet): 340 Feet Assistive device: Straight cane Gait Pattern/deviations: Step-through pattern;Decreased stride length     General Gait Details: patient very steady with ambulation, no instability or physical assist at this time.  Stairs            Wheelchair Mobility    Modified Rankin (Stroke Patients Only)       Balance Overall balance assessment: Needs assistance   Sitting balance-Leahy Scale: Normal     Standing balance support: Single extremity supported Standing balance-Leahy Scale: Fair Standing balance comment: patient able to stand without cane for period of time throughout session             High level balance activites: Backward walking;Direction changes;Turns;Sudden stops;Head  turns High Level Balance Comments: steady with ambulation and use of SPC. Patient unable to perform vertical head movement due to history of vertigo             Pertinent Vitals/Pain Pain Assessment: No/denies pain    Home Living Family/patient expects to be discharged to:: Private residence Living Arrangements: Alone Available Help at Discharge: Available PRN/intermittently (son and daughter) Type of Home: Mobile home Home Access: Ramped entrance     Home Layout: One level Home Equipment: Cane - single point;Walker - standard      Prior Function Level of Independence: Needs assistance   Gait / Transfers Assistance Needed: uses a cane  ADL's / Homemaking Assistance Needed: daughter and son help with transportation, pt stands to shower, performs housekeeping        Hand Dominance   Dominant Hand: Right    Extremity/Trunk Assessment   Upper Extremity Assessment Upper Extremity Assessment: Overall WFL for tasks assessed    Lower Extremity Assessment Lower Extremity Assessment: Overall WFL for tasks assessed       Communication   Communication: No difficulties  Cognition Arousal/Alertness: Awake/alert Behavior During Therapy: WFL for tasks assessed/performed Overall Cognitive Status: Within Functional Limits for tasks assessed                      General Comments      Exercises     Assessment/Plan    PT Assessment Patent does not need any further PT services  PT Problem List  PT Treatment Interventions      PT Goals (Current goals can be found in the Care Plan section)  Acute Rehab PT Goals Patient Stated Goal: to go home PT Goal Formulation: All assessment and education complete, DC therapy    Frequency     Barriers to discharge        Co-evaluation               End of Session Equipment Utilized During Treatment: Gait belt Activity Tolerance: Patient tolerated treatment well Patient left: in chair;with call  bell/phone within reach Nurse Communication: Mobility status PT Visit Diagnosis: Difficulty in walking, not elsewhere classified (R26.2)    Functional Assessment Tool Used: Clinical judgement Functional Limitation: Mobility: Walking and moving around Mobility: Walking and Moving Around Current Status ([id number](G8978): At least 1 percent but less than 20 percent impaired, limited or restricted Mobility: Walking and Moving Around Goal Status [phone](G8979): At least 1 percent but less than 20 percent impaired, limited or restricted Mobility: Walking and Moving Around Discharge Status [phone](G8980): At least 1 percent but less than 20 percent impaired, limited or restricted    Time: [id number] PT Time Calculation (min) (ACUTE ONLY): 15 min   Charges:   PT Evaluation $PT Eval Low Complexity: 1 Procedure     PT G Codes:   PT G-Codes **NOT FOR INPATIENT CLASS** Functional Assessment Tool Used: Clinical judgement Functional Limitation: Mobility: Walking and moving around Mobility: Walking and Moving Around Current Status ([id number](G8978): At least 1 percent but less than 20 percent impaired, limited or restricted Mobility: Walking and Moving Around Goal Status [phone](G8979): At least 1 percent but less than 20 percent impaired, limited or restricted Mobility: Walking and Moving Around Discharge Status [phone](G8980): At least 1 percent but less than 20 percent impaired, limited or restricted     Fabio AsaDevon J Mylynn Dinh 06/06/2016, 1:31 PM Charlotte Crumbevon Kandie Keiper, PT DPT  [phone]

## 2016-06-07 ENCOUNTER — Encounter (HOSPITAL_COMMUNITY)
Admission: EM | Disposition: A | Discharge status: Skilled Nursing Facility | Payer: Self-pay | Source: Home / Self Care | Attending: Cardiothoracic Surgery

## 2016-06-07 DIAGNOSIS — K5900 Constipation, unspecified: Secondary | ICD-10-CM | POA: Diagnosis present

## 2016-06-07 DIAGNOSIS — I10 Essential (primary) hypertension: Secondary | ICD-10-CM | POA: Diagnosis present

## 2016-06-07 DIAGNOSIS — D62 Acute posthemorrhagic anemia: Secondary | ICD-10-CM | POA: Diagnosis not present

## 2016-06-07 DIAGNOSIS — Z86718 Personal history of other venous thrombosis and embolism: Secondary | ICD-10-CM | POA: Diagnosis not present

## 2016-06-07 DIAGNOSIS — I2510 Atherosclerotic heart disease of native coronary artery without angina pectoris: Secondary | ICD-10-CM

## 2016-06-07 DIAGNOSIS — I6522 Occlusion and stenosis of left carotid artery: Secondary | ICD-10-CM | POA: Diagnosis not present

## 2016-06-07 DIAGNOSIS — Z888 Allergy status to other drugs, medicaments and biological substances status: Secondary | ICD-10-CM | POA: Diagnosis not present

## 2016-06-07 DIAGNOSIS — Z01810 Encounter for preprocedural cardiovascular examination: Secondary | ICD-10-CM | POA: Diagnosis not present

## 2016-06-07 DIAGNOSIS — Z8249 Family history of ischemic heart disease and other diseases of the circulatory system: Secondary | ICD-10-CM | POA: Diagnosis not present

## 2016-06-07 DIAGNOSIS — I214 Non-ST elevation (NSTEMI) myocardial infarction: Secondary | ICD-10-CM | POA: Diagnosis present

## 2016-06-07 DIAGNOSIS — G43109 Migraine with aura, not intractable, without status migrainosus: Secondary | ICD-10-CM | POA: Diagnosis present

## 2016-06-07 DIAGNOSIS — R4701 Aphasia: Secondary | ICD-10-CM | POA: Diagnosis present

## 2016-06-07 DIAGNOSIS — E8770 Fluid overload, unspecified: Secondary | ICD-10-CM | POA: Diagnosis present

## 2016-06-07 DIAGNOSIS — H5462 Unqualified visual loss, left eye, normal vision right eye: Secondary | ICD-10-CM | POA: Diagnosis present

## 2016-06-07 DIAGNOSIS — E785 Hyperlipidemia, unspecified: Secondary | ICD-10-CM | POA: Diagnosis present

## 2016-06-07 DIAGNOSIS — R7303 Prediabetes: Secondary | ICD-10-CM | POA: Diagnosis present

## 2016-06-07 DIAGNOSIS — J9811 Atelectasis: Secondary | ICD-10-CM | POA: Diagnosis present

## 2016-06-07 DIAGNOSIS — E876 Hypokalemia: Secondary | ICD-10-CM | POA: Diagnosis present

## 2016-06-07 DIAGNOSIS — Z8673 Personal history of transient ischemic attack (TIA), and cerebral infarction without residual deficits: Secondary | ICD-10-CM | POA: Diagnosis not present

## 2016-06-07 DIAGNOSIS — Z79899 Other long term (current) drug therapy: Secondary | ICD-10-CM | POA: Diagnosis not present

## 2016-06-07 DIAGNOSIS — Z7982 Long term (current) use of aspirin: Secondary | ICD-10-CM | POA: Diagnosis not present

## 2016-06-07 DIAGNOSIS — R9431 Abnormal electrocardiogram [ECG] [EKG]: Secondary | ICD-10-CM | POA: Diagnosis not present

## 2016-06-07 DIAGNOSIS — Z9889 Other specified postprocedural states: Secondary | ICD-10-CM | POA: Diagnosis not present

## 2016-06-07 DIAGNOSIS — F419 Anxiety disorder, unspecified: Secondary | ICD-10-CM | POA: Diagnosis present

## 2016-06-07 DIAGNOSIS — I679 Cerebrovascular disease, unspecified: Secondary | ICD-10-CM | POA: Diagnosis present

## 2016-06-07 DIAGNOSIS — I25708 Atherosclerosis of coronary artery bypass graft(s), unspecified, with other forms of angina pectoris: Secondary | ICD-10-CM | POA: Diagnosis not present

## 2016-06-07 DIAGNOSIS — Z9049 Acquired absence of other specified parts of digestive tract: Secondary | ICD-10-CM | POA: Diagnosis not present

## 2016-06-07 DIAGNOSIS — H409 Unspecified glaucoma: Secondary | ICD-10-CM | POA: Diagnosis present

## 2016-06-07 DIAGNOSIS — R202 Paresthesia of skin: Secondary | ICD-10-CM | POA: Diagnosis present

## 2016-06-07 DIAGNOSIS — I222 Subsequent non-ST elevation (NSTEMI) myocardial infarction: Secondary | ICD-10-CM | POA: Diagnosis not present

## 2016-06-07 HISTORY — PX: LEFT HEART CATH AND CORONARY ANGIOGRAPHY: CATH118249

## 2016-06-07 LAB — PROTIME-INR
INR: 1.02
Prothrombin Time: 13.4 seconds (ref 11.4–15.2)

## 2016-06-07 LAB — CBC WITH DIFFERENTIAL/PLATELET
Basophils Absolute: 0 10*3/uL (ref 0.0–0.1)
Basophils Relative: 1 %
EOS PCT: 5 %
Eosinophils Absolute: 0.3 10*3/uL (ref 0.0–0.7)
HEMATOCRIT: 36.1 % (ref 36.0–46.0)
HEMOGLOBIN: 11.7 g/dL — AB (ref 12.0–15.0)
LYMPHS ABS: 2.8 10*3/uL (ref 0.7–4.0)
LYMPHS PCT: 45 %
MCH: 30.2 pg (ref 26.0–34.0)
MCHC: 32.4 g/dL (ref 30.0–36.0)
MCV: 93.3 fL (ref 78.0–100.0)
MONO ABS: 0.4 10*3/uL (ref 0.1–1.0)
Monocytes Relative: 6 %
Neutro Abs: 2.7 10*3/uL (ref 1.7–7.7)
Neutrophils Relative %: 43 %
Platelets: 278 10*3/uL (ref 150–400)
RBC: 3.87 MIL/uL (ref 3.87–5.11)
RDW: 12.9 % (ref 11.5–15.5)
WBC: 6.2 10*3/uL (ref 4.0–10.5)

## 2016-06-07 LAB — COMPREHENSIVE METABOLIC PANEL
ALK PHOS: 56 U/L (ref 38–126)
ALT: 14 U/L (ref 14–54)
AST: 23 U/L (ref 15–41)
Albumin: 3 g/dL — ABNORMAL LOW (ref 3.5–5.0)
Anion gap: 7 (ref 5–15)
BILIRUBIN TOTAL: 0.5 mg/dL (ref 0.3–1.2)
BUN: 6 mg/dL (ref 6–20)
CALCIUM: 9 mg/dL (ref 8.9–10.3)
CO2: 26 mmol/L (ref 22–32)
CREATININE: 0.81 mg/dL (ref 0.44–1.00)
Chloride: 108 mmol/L (ref 101–111)
GFR calc non Af Amer: >60 mL/min (ref >60)
Glucose, Bld: 95 mg/dL (ref 65–99)
Potassium: 3.8 mmol/L (ref 3.5–5.1)
SODIUM: 141 mmol/L (ref 135–145)
Total Protein: 5.6 g/dL — ABNORMAL LOW (ref 6.5–8.1)

## 2016-06-07 SURGERY — LEFT HEART CATH AND CORONARY ANGIOGRAPHY
Anesthesia: LOCAL

## 2016-06-07 MED ORDER — IOPAMIDOL (ISOVUE-370) INJECTION 76%
INTRAVENOUS | Status: AC
  Filled 2016-06-07: qty 100

## 2016-06-07 MED ORDER — VERAPAMIL HCL 2.5 MG/ML IV SOLN
INTRAVENOUS | Status: DC | PRN
  Administered 2016-06-07: 10 mL via INTRA_ARTERIAL

## 2016-06-07 MED ORDER — FENTANYL CITRATE (PF) 100 MCG/2ML IJ SOLN
INTRAMUSCULAR | Status: AC
  Filled 2016-06-07: qty 2

## 2016-06-07 MED ORDER — SODIUM CHLORIDE 0.9 % WEIGHT BASED INFUSION
3.0000 mL/kg/h | INTRAVENOUS | Status: DC
  Administered 2016-06-07: 3 mL/kg/h via INTRAVENOUS

## 2016-06-07 MED ORDER — LIDOCAINE HCL (PF) 1 % IJ SOLN
INTRAMUSCULAR | Status: DC | PRN
  Administered 2016-06-07: 2 mL

## 2016-06-07 MED ORDER — SODIUM CHLORIDE 0.9% FLUSH: 3.0000 mL | INTRAVENOUS | Status: DC | PRN

## 2016-06-07 MED ORDER — HEPARIN (PORCINE) IN NACL 2-0.9 UNIT/ML-% IJ SOLN
INTRAMUSCULAR | Status: DC | PRN
  Administered 2016-06-07: 1000 mL

## 2016-06-07 MED ORDER — SODIUM CHLORIDE 0.9% FLUSH
3.0000 mL | Freq: Two times a day (BID) | INTRAVENOUS | Status: DC
  Administered 2016-06-07: 3 mL via INTRAVENOUS

## 2016-06-07 MED ORDER — SODIUM CHLORIDE 0.9 % WEIGHT BASED INFUSION: 1.0000 mL/kg/h | INTRAVENOUS | Status: DC

## 2016-06-07 MED ORDER — FENTANYL CITRATE (PF) 100 MCG/2ML IJ SOLN
INTRAMUSCULAR | Status: DC | PRN
  Administered 2016-06-07: 25 ug via INTRAVENOUS

## 2016-06-07 MED ORDER — ASPIRIN 81 MG PO CHEW
81.0000 mg | CHEWABLE_TABLET | ORAL | Status: AC
  Administered 2016-06-07: 81 mg via ORAL
  Filled 2016-06-07: qty 1

## 2016-06-07 MED ORDER — HEPARIN SODIUM (PORCINE) 1000 UNIT/ML IJ SOLN
INTRAMUSCULAR | Status: DC | PRN
  Administered 2016-06-07: 4000 [IU] via INTRAVENOUS

## 2016-06-07 MED ORDER — HEPARIN (PORCINE) IN NACL 2-0.9 UNIT/ML-% IJ SOLN
INTRAMUSCULAR | Status: AC
  Filled 2016-06-07: qty 1000

## 2016-06-07 MED ORDER — SODIUM CHLORIDE 0.9 % IV SOLN: 250.0000 mL | INTRAVENOUS | Status: DC | PRN

## 2016-06-07 MED ORDER — LIDOCAINE HCL (PF) 1 % IJ SOLN
INTRAMUSCULAR | Status: AC
  Filled 2016-06-07: qty 30

## 2016-06-07 MED ORDER — HEPARIN SODIUM (PORCINE) 1000 UNIT/ML IJ SOLN
INTRAMUSCULAR | Status: AC
  Filled 2016-06-07: qty 1

## 2016-06-07 MED ORDER — SODIUM CHLORIDE 0.9 % IV SOLN: INTRAVENOUS | Status: AC

## 2016-06-07 MED ORDER — ASPIRIN 81 MG PO CHEW: 81.0000 mg | CHEWABLE_TABLET | ORAL | Status: DC

## 2016-06-07 MED ORDER — SODIUM CHLORIDE 0.9% FLUSH
3.0000 mL | Freq: Two times a day (BID) | INTRAVENOUS | Status: DC
  Administered 2016-06-08 (×2): 3 mL via INTRAVENOUS

## 2016-06-07 SURGICAL SUPPLY — 14 items
CATH EXPO 5F FL3.5 (CATHETERS) ×1 IMPLANT
CATH EXPO 5FR ANG PIGTAIL 145 (CATHETERS) ×1 IMPLANT
CATH INFINITI JR4 5F (CATHETERS) ×1 IMPLANT
CATH OPTITORQUE JACKY 4.0 5F (CATHETERS) ×1 IMPLANT
DEVICE RAD COMP TR BAND LRG (VASCULAR PRODUCTS) ×1 IMPLANT
GLIDESHEATH SLEND SS 6F .021 (SHEATH) ×1 IMPLANT
GUIDEWIRE INQWIRE 1.5J.035X260 (WIRE) IMPLANT
INQWIRE 1.5J .035X260CM (WIRE) ×2
KIT HEART LEFT (KITS) ×2 IMPLANT
PACK CARDIAC CATHETERIZATION (CUSTOM PROCEDURE TRAY) ×2 IMPLANT
TRANSDUCER W/STOPCOCK (MISCELLANEOUS) ×2 IMPLANT
TUBING CIL FLEX 10 FLL-RA (TUBING) ×2 IMPLANT
WIRE HI TORQ VERSACORE-J 145CM (WIRE) ×1 IMPLANT
WIRE RUNTHROUGH .014X180CM (WIRE) ×1 IMPLANT

## 2016-06-07 NOTE — Progress Notes (Signed)
Spoke with patient and family who confirm that acute symptoms have resolved. Defer formal evaluation. Please reconsult if needed.  Sheryl LangoLeah Taquisha Phung MA, CCC-SLP [phone]

## 2016-06-07 NOTE — Progress Notes (Signed)
Progress Note  Patient Name: Sheryl Carter Date of Encounter: 06/07/2016  Primary Cardiologist: Follow-Up in [place], [place]   Subjective   No chest pain or palpitations. Breathing at baseline. No orthopnea or lower extremity edema.   Inpatient Medications    Scheduled Meds: . ALPRAZolam  0.125 mg Oral BID  . aspirin EC  325 mg Oral Daily  . enoxaparin (LOVENOX) injection  40 mg Subcutaneous Q24H  . metoprolol tartrate  25 mg Oral BID  . pantoprazole  40 mg Oral Daily  . sodium chloride flush  3 mL Intravenous Q12H  . timolol  1 drop Right Eye BID   Continuous Infusions: . sodium chloride    . [START ON 06/08/2016] sodium chloride 3 mL/kg/hr (06/07/16 0537)   Followed by  . [START ON 06/08/2016] sodium chloride 1 mL/kg/hr (06/07/16 1610)   PRN Meds: sodium chloride, oxyCODONE-acetaminophen, senna-docusate, sodium chloride flush, witch hazel-glycerin   Vital Signs    Vitals:   06/06/16 2110 06/06/16 2239 06/07/16 0359 06/07/16 0804  BP: 123/65 (!) 139/94 127/71 133/72  Pulse: 61 69 (!) 59 60  Resp: 17 14 17 16   Temp:  98.3 F (36.8 C) 97.3 F (36.3 C) 98.1 F (36.7 C)  TempSrc:  Oral Oral Oral  SpO2: 99% 97% 97% 100%  Weight:      Height:        Intake/Output Summary (Last 24 hours) at 06/07/16 0814 Last data filed at 06/07/16 0800  Gross per 24 hour  Intake           754.69 ml  Output             1800 ml  Net         -1045.31 ml   Filed Weights   06/04/16 1824 06/05/16 0125 06/06/16 1957  Weight: 183 lb (83 kg) 183 lb 6.8 oz (83.2 kg) 186 lb 8.2 oz (84.6 kg)    Telemetry    NSR, HR in mid-50's to 60's. No ectopic events.  - Personally Reviewed  ECG    06/04/2016: NSR, HR 92, with slight ST depression along inferior and lateral leads (simialr to prior tracings).  - Personally Reviewed  Physical Exam   General: Well developed, well nourished Caucasian female appearing in no acute distress. Head: Normocephalic, atraumatic.  Neck: Supple without  bruits, JVD not elevated. Lungs:  Resp regular and unlabored, CTA without wheezing or rales. Heart: RRR, S1, S2, no S3, S4, or murmur; no rub. Abdomen: Soft, non-tender, non-distended with normoactive bowel sounds. No hepatomegaly. No rebound/guarding. No obvious abdominal masses. Extremities: No clubbing, cyanosis, or edema. Distal pedal pulses are 2+ bilaterally. Strong radial pulses.  Neuro: Alert and oriented X 3. Moves all extremities spontaneously. Psych: Normal affect.  Labs    Chemistry  Recent Labs Lab 06/04/16 1834 06/04/16 1840 06/05/16 0222  NA 137 138  --   K 3.7 3.7  --   CL 100* 99*  --   CO2 30  --   --   GLUCOSE 114* 114*  --   BUN 8 5*  --   CREATININE 0.87 0.90 0.81  CALCIUM 9.5  --   --   PROT 6.8  --   --   ALBUMIN 3.4*  --   --   AST 28  --   --   ALT 17  --   --   ALKPHOS 65  --   --   BILITOT 0.4  --   --  GFRNONAA >60  --  >60  GFRAA >60  --  >60  ANIONGAP 7  --   --      Hematology  Recent Labs Lab 06/04/16 1834 06/04/16 1840 06/05/16 0222  WBC 7.2  --  5.9  RBC 4.02  --  3.89  HGB 12.7 12.2 11.8*  HCT 36.8 36.0 35.6*  MCV 91.5  --  91.5  MCH 31.6  --  30.3  MCHC 34.5  --  33.1  RDW 12.5  --  12.7  PLT 290  --  251    Cardiac Enzymes  Recent Labs Lab 06/05/16 0222 06/05/16 0806 06/05/16 1534  TROPONINI 3.12* 2.55* 2.19*     Recent Labs Lab 06/04/16 1839 06/04/16 2033  TROPIPOC 3.32* 2.93*     BNPNo results for input(s): BNP, PROBNP in the last 168 hours.   DDimer No results for input(s): DDIMER in the last 168 hours.   Radiology    [name]  Result Date: 06/05/2016 CLINICAL DATA:  Acute presentation with headache, confusion and speech disturbance. Numbness in the feet. EXAM: MRI HEAD WITHOUT CONTRAST MRA HEAD WITHOUT CONTRAST TECHNIQUE: Multiplanar, multiecho pulse sequences of the brain and surrounding structures were obtained without intravenous contrast. Angiographic images of the head were  obtained using MRA technique without contrast. COMPARISON:  CT 06/04/2016.  MRI 04/18/2016. FINDINGS: MRI HEAD FINDINGS Brain: Diffusion imaging does not show any acute or subacute infarction. The brainstem is normal. There is an old infarction in the inferior cerebellum on the right which is unchanged. Cerebral hemispheres show age related atrophy with moderate chronic small-vessel ischemic changes affecting the deep and subcortical white matter. No mass lesion, hemorrhage, hydrocephalus or extra-axial collection. No visible change since the previous study. Vascular: Major vessels at the base of the brain show flow. Skull and upper cervical spine: Negative Sinuses/Orbits: Clear/ normal. Small amount of mastoid air cell fluid. Other: None significant MRA HEAD FINDINGS Both internal carotid artery's are patent through the skullbase and siphon regions. There is considerable atherosclerotic irregularity in the carotid siphon regions but without suspicion of flow limiting stenosis. Supraclinoid internal carotid arteries are normal. Anterior and middle cerebral arteries are widely patent. Fetal origin left PCA. Left vertebral artery is dominant. Small right vertebral artery show antegrade flow to PICA up. No flow an the right vertebral artery beyond PICA. Basilar artery shows some atherosclerotic irregularity without flow limiting stenosis. Anterior inferior cerebellar arteries, superior cerebellar arteries and posterior cerebral arteries are patent bilaterally. Left PCA takes fetal origin as noted previously. IMPRESSION: MRI head: No acute finding. 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 Moderate chronic small-vessel ischemic changes affecting the cerebral hemispheres as seen previously. MRA head: No change. Atherosclerotic change in both carotid siphon regions without flow limiting stenosis. No intracranial large or medium vessel anterior circulation abnormal finding. Small right vertebral artery which gives  flow to right PICA but does not contribute to the basilar. Dominant left vertebral artery widely patent to the basilar. Atherosclerotic irregularity of the basilar artery without flow limiting stenosis. Electronically Signed   By: Paulina Fusi M.D.   On: 06/05/2016 07:43   Dg Chest Port 1 View  Result Date: 06/04/2016 CLINICAL DATA:  Feet numbness after LEFT carotid artery stenting. Headache and confusion. EXAM: PORTABLE CHEST 1 VIEW COMPARISON:  Chest radiograph Aug 17, 2004 FINDINGS: Cardiomediastinal silhouette is unremarkable for this low inspiratory examination with crowded vasculature markings. The lungs are clear without pleural effusions or focal consolidations. Trachea projects  midline and there is no pneumothorax. Included soft tissue planes and osseous structures are non-suspicious. IMPRESSION: No acute cardiopulmonary process for this low inspiratory portable examination. Electronically Signed   By: Awilda Metro M.D.   On: 06/04/2016 19:21   [name] Head/brain LK Cm  Result Date: 06/05/2016 CLINICAL DATA:  Acute presentation with headache, confusion and speech disturbance. Numbness in the feet. EXAM: MRI HEAD WITHOUT CONTRAST MRA HEAD WITHOUT CONTRAST TECHNIQUE: Multiplanar, multiecho pulse sequences of the brain and surrounding structures were obtained without intravenous contrast. Angiographic images of the head were obtained using MRA technique without contrast. COMPARISON:  CT 06/04/2016.  MRI 04/18/2016. FINDINGS: MRI HEAD FINDINGS Brain: Diffusion imaging does not show any acute or subacute infarction. The brainstem is normal. There is an old infarction in the inferior cerebellum on the right which is unchanged. Cerebral hemispheres show age related atrophy with moderate chronic small-vessel ischemic changes affecting the deep and subcortical white matter. No mass lesion, hemorrhage, hydrocephalus or extra-axial collection. No visible change since the previous study. Vascular: Major  vessels at the base of the brain show flow. Skull and upper cervical spine: Negative Sinuses/Orbits: Clear/ normal. Small amount of mastoid air cell fluid. Other: None significant MRA HEAD FINDINGS Both internal carotid artery's are patent through the skullbase and siphon regions. There is considerable atherosclerotic irregularity in the carotid siphon regions but without suspicion of flow limiting stenosis. Supraclinoid internal carotid arteries are normal. Anterior and middle cerebral arteries are widely patent. Fetal origin left PCA. Left vertebral artery is dominant. Small right vertebral artery show antegrade flow to PICA up. No flow an the right vertebral artery beyond PICA. Basilar artery shows some atherosclerotic irregularity without flow limiting stenosis. Anterior inferior cerebellar arteries, superior cerebellar arteries and posterior cerebral arteries are patent bilaterally. Left PCA takes fetal origin as noted previously. IMPRESSION: MRI head: No acute finding. 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 Moderate chronic small-vessel ischemic changes affecting the cerebral hemispheres as seen previously. MRA head: No change. Atherosclerotic change in both carotid siphon regions without flow limiting stenosis. No intracranial large or medium vessel anterior circulation abnormal finding. Small right vertebral artery which gives flow to right PICA but does not contribute to the basilar. Dominant left vertebral artery widely patent to the basilar. Atherosclerotic irregularity of the basilar artery without flow limiting stenosis. Electronically Signed   By: Paulina Fusi M.D.   On: 06/05/2016 07:43   Ct Head Code Stroke W/o Cm  Result Date: 06/04/2016 CLINICAL DATA:  Code stroke. Headache, confusion, speech difficulty beginning at 3 p.m. Status post LEFT carotid stent 1 week ago with subsequent foot numbness. History of hypertension, LEFT eye blindness, stroke. EXAM: CT HEAD WITHOUT CONTRAST  TECHNIQUE: Contiguous axial images were obtained from the base of the skull through the vertex without intravenous contrast. COMPARISON:  MRI head and CTA HEAD April 18, 2016 FINDINGS: BRAIN: Stable moderate to severe ventriculomegaly on the basis of global parenchymal brain volume loss. Old RIGHT cerebellar infarct. No intraparenchymal hemorrhage, mass effect, midline shift or acute large vascular territory infarct. Moderate to severe stable ventriculomegaly on the basis of global parenchymal brain volume loss. No abnormal extra-axial fluid collections. Basal cisterns are patent. VASCULAR: Moderate calcific atherosclerosis of the carotid siphons. SKULL: No skull fracture. No significant scalp soft tissue swelling. SINUSES/ORBITS: The mastoid air-cells and included paranasal sinuses are well-aerated.Status post LEFT ocular lens implant. The included ocular globes and orbital contents are non-suspicious. OTHER: None. ASPECTS [facility] Stroke Program Early CT Score) -  Ganglionic level infarction (caudate, lentiform nuclei, internal capsule, insula, M1-M3 cortex): 7 - Supraganglionic infarction (M4-M6 cortex): 3 Total score (0-10 with 10 being normal): 10 IMPRESSION: 1. No acute intracranial process. Stable examination including mild to moderate chronic small vessel ischemic disease and old small RIGHT cerebellar infarct. 2. ASPECTS is 10. Critical Value/emergent results were called by telephone at the time of interpretation on 06/04/2016 at 6:57 pm to Dr. Samuel Jester , who verbally acknowledged these results. Electronically Signed   By: Awilda Metro M.D.   On: 06/04/2016 18:59    Cardiac Studies   Echocardiogram: 04/19/2016 Study Conclusions  - Left ventricle: The cavity size was normal. Wall thickness was increased in a pattern of mild LVH. Systolic function was normal. The estimated ejection fraction was in the range of 60% to 65%. Wall motion was normal; there were no regional wall  motion abnormalities. The study is not technically sufficient to allow evaluation of LV diastolic function. - Aortic valve: Mildly calcified annulus. Trileaflet. - Mitral valve: Calcified annulus. - Atrial septum: No defect or patent foramen ovale was identified. - Tricuspid valve: There was trivial regurgitation. - Pulmonary arteries: Systolic pressure could not be accurately estimated. - Pericardium, extracardiac: A prominent pericardial fat pad was present.  Impressions:  - Mild LVH with LVEF 60-65%. Indeterminate diastolic function. Mildly calcified mitral regurgitation. Trivial tricuspid regurgitation. Unable to assess PASP. No obvious PFO or ASD.  Limited Echo: 06/05/2016  Study Conclusions  - Left ventricle: Systolic function was normal. The estimated   ejection fraction was in the range of 50% to 55%. Possible   hypokinesis of the inferior myocardium. - Mitral valve: Calcified annulus.  Impressions:  - Consider Definity contrast study.  Patient Profile     76 y.o. female w/ PMH of HTN, HLD, Glaucoma, and recent left carotid endarterectomy (05/29/2016) admitted for possible TIA. Cards consulted for elevated troponin.   Assessment & Plan     1. Elevated Troponin/ Abnormal Echocardiogram - presented with a headache, slurred speech and lower extremity numbness. Unclear why a Troponin was initially checked as she denies any recent chest discomfort or dyspnea with exertion.  - echo in 03/2016 showed a preserved EF of 60-65%, no WMA, trivial TR, and a prominent pericardiac fat pad. - Initial troponin at 3.32, with repeat values at 2.93, 3.12, 2.55, and 2.19. EKG with NSR, HR 92, and slight ST depression along inferior and lateral leads (similar to prior tracings).   - limited echo shows a slightly reduced EF of 50-55% with possible hypokinesis of the inferior myocardium. With elevated troponin values and WMA on echo, an ischemic evaluation is warranted. Echo  with definity contrast confirmed WMA. Plan is for a cardiac catheterization this AM. The patient understands that risks included but are not limited to stroke (1 in 1000), death (1 in 1000), kidney failure [usually temporary] (1 in 500), bleeding (1 in 200), allergic reaction [possibly serious] (1 in 200). Has been NPO since midnight.  - continue ASA, statin, and BB (do not further titrate with HR in the mid-50's at times).   2. Carotid Artery Disease - underwent recent left carotid endarterectomy on 05/29/2016. - repeat carotid doppler study showed 1-39% stenosis bilaterally.   3. HTN - BP at 121/65 - 139/94 in the past 24 hours.  - was on Lisinopril-HCTZ 10-12.5mg  daily prior to admission. This has been held and she has since been started on Lopressor 25mg  BID in the setting of her elevated troponin.   4. HLD -  Lipid Panel this admission shows total cholesterol 216, HDL 36, and LDL 154. - started on Pravastatin 20mg  daily. Intolerant to high-intensity statin therapy in the past.   5. TIA/ Prior CVA - CT Imaging showed no acute intracranial processes with stable examination of the mild to moderate small vessel disease and an old small right cerebellar infarct. MRI with similar findings.  - Neurology following.    Signed, Ellsworth LennoxBrittany M Strader , PA-C 8:14 AM 06/07/2016 Pager: [phone]  I have seen and examined the patient along with Ellsworth LennoxBrittany M Strader , PA-C.  I have reviewed the chart, notes and new data.  I agree with PA's note.  Key new complaints: no angina, no new neuro complaints Key examination changes: no overt HF, no significant arrhythmia Key new findings / data: reviewed follow up echo with patient and family  PLAN: For cath/possible PCI today. This procedure has been fully reviewed with the patient and written informed consent has been obtained.   Thurmon FairMihai Giovonni Poirier, MD, [facility] Center[facility]ACC [facility] [phone] 06/07/2016, 10:22 AM

## 2016-06-07 NOTE — Consult Note (Signed)
[address]       [place] [address]             [id number]        Sheryl Carter [facility] Medical Record #[id number] Date of Birth: 11/13/1940  Referring: Dr  Samuella Cota Primary Care: [name], MD  Chief Complaint:    Chief Complaint  Patient presents with  . Numbness   Severe multivessel coronary artery disease   History of Present Illness:    The patient is a 75 year old female who is status post recent left carotid endarterectomy by Dr Arbie Cookey on 05/29/2016. She also has multiple cardiac risk factors including hypertension and hyperlipidemia. She presented to the emergency room on 06/04/2016 At [facility] after having an episode of slow speech. She did not rule in for stroke by CT scan. In the emergency department she was found to have an elevated troponin of 3.32 and cardiology consultation was obtained. She was transferred to [facility] for further evaluation and treatment. On today's date she was found on cardiac catheterization to have severe multivessel coronary artery disease and we are requested to see the patient for a cardiothoracic surgical consultation. Echocardiogram was done on 3/12 results are listed below with findings consistent with reduced ejection fraction and range of 45%, Echo was noted to be at a very poor quality  At the [place] saw the patient she was listed as a DO NOT RESUSCITATE. I discussed this designation with the patient and her daughter, this was clarified and the patient never intended to be a DO NOT RESUSCITATE but one at the hospital staff know that she had had an attorney help her draft a living will. Her daughter confirms this. I discussed this with Dr. Allena Katz who will change her CODE STATUS    Current Activity/ Functional Status: Patient is not independent with mobility/ambulation, transfers, ADL's, IADL's.- uses a cane and is not very physically active.   Zubrod Score: At the time of surgery this patient's most  appropriate activity status/level should be described as: []     0    Normal activity, no symptoms [x]     1    Restricted in physical strenuous activity but ambulatory, able to do out light work []     2    Ambulatory and capable of self care, unable to do work activities, up and about                 more than 50%  Of the time                            []     3    Only limited self care, in bed greater than 50% of waking hours []     4    Completely disabled, no self care, confined to bed or chair []     5    Moribund  Past Medical History:  Diagnosis Date  . Anxiety   . Blindness of left eye   . Complication of anesthesia    has gas trapped in her body after  . DVT (deep venous thrombosis) (HCC)   . Ear problems   . Family history of adverse reaction to anesthesia    daughter has gas trapped in her body postop as well  . Glaucoma   . Hypertension   . Pre-diabetes    pt. told by [facility] staff that she is prediabetic, HgbA1c  was 5.4  . Stroke (HCC)    having TIA's    Past Surgical History:  Procedure Laterality Date  . BREAST SURGERY Left    cyst- L breast  . CHOLECYSTECTOMY    . ENDARTERECTOMY Left 05/29/2016   Procedure: LEFT CAROTID ENDARTERECTOMY WITH PATCH ANGIOPLASTY;  Surgeon: Larina Earthly, MD;  Location: [facility] OR;  Service: Vascular;  Laterality: Left;  . EYE SURGERY      History  Smoking Status  . Never Smoker  Smokeless Tobacco  . Never Used    History  Alcohol Use No    Social History   Social History  . Marital status: Divorced    Spouse name: N/A  . Number of children: N/A  . Years of education: N/A   Occupational History  . Not on file.   Social History Main Topics  . Smoking status: Never Smoker  . Smokeless tobacco: Never Used  . Alcohol use No  . Drug use: No  . Sexual activity: Not on file   Other Topics Concern  . Not on file   Social History Narrative  . No narrative on file    Allergies  Allergen Reactions  . Other Hives    Mycins-    Pt has Glaucoma in Right eye, Blind in Left eye .... PLEASE DO NOT GIVE ANYTHING TO PATIENT THAT MIGHT DESTROY VISION   . Erythromycin     Reaction is unknown  . Prednisone     Altered mental status  . Statins Other (See Comments)    Weakness, muscle aches, and pain    Current Facility-Administered Medications  Medication Dose Route Frequency Provider Last Rate Last Dose  . 0.9 %  sodium chloride infusion   Intravenous Continuous Meredeth Ide, MD      . 0.9 %  sodium chloride infusion  250 mL Intravenous PRN Ellsworth Lennox, [place]      . Melene Muller ON 06/08/2016] 0.9% sodium chloride infusion  3 mL/kg/hr Intravenous Continuous Ellsworth Lennox, [place] 249.6 mL/hr at 06/07/16 0537 3 mL/kg/hr at 06/07/16 0537   Followed by  . [START ON 06/08/2016] 0.9% sodium chloride infusion  1 mL/kg/hr Intravenous Continuous Ellsworth Lennox, [place] 83.2 mL/hr at 06/07/16 1610 1 mL/kg/hr at 06/07/16 9604  . [MAR Hold] ALPRAZolam (XANAX) tablet 0.125 mg  0.125 mg Oral BID Meredeth Ide, MD   0.125 mg at 06/07/16 1100  . [MAR Hold] aspirin EC tablet 325 mg  325 mg Oral Daily Marvel Plan, MD   325 mg at 06/06/16 0912  . [MAR Hold] enoxaparin (LOVENOX) injection 40 mg  40 mg Subcutaneous Q24H Meredeth Ide, MD   40 mg at 06/06/16 0912  . [MAR Hold] metoprolol tartrate (LOPRESSOR) tablet 25 mg  25 mg Oral BID Zannie Cove, MD   25 mg at 06/07/16 1104  . [MAR Hold] oxyCODONE-acetaminophen (PERCOCET/ROXICET) 5-325 MG per tablet 1 tablet  1 tablet Oral Q6H PRN Meredeth Ide, MD      . Mitzi Hansen Hold] pantoprazole (PROTONIX) EC tablet 40 mg  40 mg Oral Daily Meredeth Ide, MD   40 mg at 06/07/16 1104  . [MAR Hold] senna-docusate (Senokot-S) tablet 1 tablet  1 tablet Oral QHS PRN Meredeth Ide, MD      . sodium chloride flush (NS) 0.9 % injection 3 mL  3 mL Intravenous Q12H Ellsworth Lennox, PA   3 mL at 06/07/16 1105  . sodium chloride flush (NS) 0.9 % injection 3 mL  3 mL Intravenous PRN Ellsworth Lennox, [place]      . Mitzi Hansen Hold]  timolol (TIMOPTIC) 0.5 % ophthalmic solution 1 drop  1 drop Right Eye BID Meredeth Ide, MD   1 drop at 06/07/16 [phone]  . [MAR Hold] witch hazel-glycerin (TUCKS) pad   Topical PRN Eduard Clos, MD        Prescriptions Prior to Admission  Medication Sig Dispense Refill Last Dose  . ALPRAZolam (XANAX) 0.25 MG tablet Take 0.125 mg by mouth 2 (two) times daily.    06/04/2016 at Unknown time  . aspirin EC 81 MG tablet Take 1 tablet (81 mg total) by mouth 2 (two) times daily.   06/04/2016 at Unknown time  . esomeprazole (NEXIUM 24HR) 20 MG capsule Take 20 mg by mouth at bedtime.    06/03/2016 at Unknown time  . lisinopril-hydrochlorothiazide (PRINZIDE,ZESTORETIC) 10-12.5 MG tablet Take 1 tablet by mouth at bedtime.    06/03/2016 at Unknown time  . timolol (BETIMOL) 0.5 % ophthalmic solution Place 1 drop into the right eye 2 (two) times daily.    06/04/2016 at Unknown time  . vitamin B-12 (CYANOCOBALAMIN) 1000 MCG tablet Take 1,000 mcg by mouth at bedtime.    06/03/2016 at Unknown time  . oxyCODONE-acetaminophen (ROXICET) 5-325 MG tablet Take 1 tablet by mouth every 6 (six) hours as needed for severe pain. 8 tablet 0     Family History  Problem Relation Age of Onset  . Heart disease Sister   . Heart disease Son      Review of Systems:  Review of Systems  Constitutional: Positive for chills and malaise/fatigue. Negative for diaphoresis, fever and weight loss.  HENT: Negative.   Eyes: Positive for blurred vision and redness. Negative for double vision, photophobia, pain and discharge.       Blind in left eye, + glaucoma right eye  Respiratory: Positive for shortness of breath. Negative for cough, hemoptysis, sputum production and wheezing.   Cardiovascular: Positive for palpitations and leg swelling. Negative for chest pain, orthopnea, claudication and PND.  Gastrointestinal: Positive for blood in stool, constipation, diarrhea, heartburn, nausea and vomiting. Negative for melena.       Has  hemorhoids with some bleeding  Genitourinary: Negative.   Musculoskeletal: Positive for back pain, joint pain, myalgias and neck pain. Negative for falls.  Skin: Negative.   Neurological: Positive for dizziness, tingling, sensory change and weakness. Negative for seizures and loss of consciousness.  Endo/Heme/Allergies: Positive for environmental allergies and polydipsia. Does not bruise/bleed easily.  Psychiatric/Behavioral: Positive for depression. The patient is nervous/anxious.        Had "nervous breakdown " in the *)'s   Physical Exam: BP (!) 158/61   Pulse 67   Temp 97.4 F (36.3 C) (Oral)   Resp 16   Ht 4\' 7"  (1.397 m)   Wt 186 lb 8.2 oz (84.6 kg)   SpO2 100%   BMI 43.35 kg/m     Physical Exam  Constitutional: She appears chronically ill.  HENT:  Nose: Nose normal. No nasal discharge.  Mouth/Throat: Oropharynx is clear. Pharynx is normal.  edentulous  Eyes:  Left eye is opacified and without vision  Neck: Neck supple. No JVD present. No neck adenopathy. No thyromegaly present.  Cardiovascular: Regular rhythm and normal heart sounds.   No murmur heard. Pulses:      Dorsalis pedis pulses are 2+ on the right side, and 2+ on the left side.       Posterior  tibial pulses are 0 on the right side, and 0 on the left side.  Pulmonary/Chest: Breath sounds normal. She has no wheezes. She has no rales. She exhibits no tenderness.  Abdominal: Soft. She exhibits no distension and no mass. There is no tenderness.  Musculoskeletal: She exhibits no edema or tenderness.  Neurological: She is alert and oriented to person, place, and time.  In bed after Cath- gait not tested, motor/sensory grossly intact  Skin: Skin is warm and dry. No rash noted. No cyanosis. No jaundice.      Diagnostic Studies & Laboratory data:     Recent Radiology Findings:  Mr Brain 80 Contrast  Result Date: 06/05/2016 CLINICAL DATA:  Acute presentation with headache, confusion and speech disturbance.  Numbness in the feet. EXAM: MRI HEAD WITHOUT CONTRAST MRA HEAD WITHOUT CONTRAST TECHNIQUE: Multiplanar, multiecho pulse sequences of the brain and surrounding structures were obtained without intravenous contrast. Angiographic images of the head were obtained using MRA technique without contrast. COMPARISON:  CT 06/04/2016.  MRI 04/18/2016. FINDINGS: MRI HEAD FINDINGS Brain: Diffusion imaging does not show any acute or subacute infarction. The brainstem is normal. There is an old infarction in the inferior cerebellum on the right which is unchanged. Cerebral hemispheres show age related atrophy with moderate chronic small-vessel ischemic changes affecting the deep and subcortical white matter. No mass lesion, hemorrhage, hydrocephalus or extra-axial collection. No visible change since the previous study. Vascular: Major vessels at the base of the brain show flow. Skull and upper cervical spine: Negative Sinuses/Orbits: Clear/ normal. Small amount of mastoid air cell fluid. Other: None significant MRA HEAD FINDINGS Both internal carotid artery's are patent through the skullbase and siphon regions. There is considerable atherosclerotic irregularity in the carotid siphon regions but without suspicion of flow limiting stenosis. Supraclinoid internal carotid arteries are normal. Anterior and middle cerebral arteries are widely patent. Fetal origin left PCA. Left vertebral artery is dominant. Small right vertebral artery show antegrade flow to PICA up. No flow an the right vertebral artery beyond PICA. Basilar artery shows some atherosclerotic irregularity without flow limiting stenosis. Anterior inferior cerebellar arteries, superior cerebellar arteries and posterior cerebral arteries are patent bilaterally. Left PCA takes fetal origin as noted previously. IMPRESSION: MRI head: No acute finding. 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 Moderate chronic small-vessel ischemic changes affecting the cerebral  hemispheres as seen previously. MRA head: No change. Atherosclerotic change in both carotid siphon regions without flow limiting stenosis. No intracranial large or medium vessel anterior circulation abnormal finding. Small right vertebral artery which gives flow to right PICA but does not contribute to the basilar. Dominant left vertebral artery widely patent to the basilar. Atherosclerotic irregularity of the basilar artery without flow limiting stenosis. Electronically Signed   By: Paulina Fusi M.D.   On: 06/05/2016 07:43   Dg Chest Port 1 View  Result Date: 06/04/2016 CLINICAL DATA:  Feet numbness after LEFT carotid artery stenting. Headache and confusion. EXAM: PORTABLE CHEST 1 VIEW COMPARISON:  Chest radiograph Aug 17, 2004 FINDINGS: Cardiomediastinal silhouette is unremarkable for this low inspiratory examination with crowded vasculature markings. The lungs are clear without pleural effusions or focal consolidations. Trachea projects midline and there is no pneumothorax. Included soft tissue planes and osseous structures are non-suspicious. IMPRESSION: No acute cardiopulmonary process for this low inspiratory portable examination. Electronically Signed   By: Awilda Metro M.D.   On: 06/04/2016 19:21   [name] Head/brain RU Cm  Result Date: 06/05/2016 CLINICAL DATA:  Acute presentation with  headache, confusion and speech disturbance. Numbness in the feet. EXAM: MRI HEAD WITHOUT CONTRAST MRA HEAD WITHOUT CONTRAST TECHNIQUE: Multiplanar, multiecho pulse sequences of the brain and surrounding structures were obtained without intravenous contrast. Angiographic images of the head were obtained using MRA technique without contrast. COMPARISON:  CT 06/04/2016.  MRI 04/18/2016. FINDINGS: MRI HEAD FINDINGS Brain: Diffusion imaging does not show any acute or subacute infarction. The brainstem is normal. There is an old infarction in the inferior cerebellum on the right which is unchanged. Cerebral hemispheres  show age related atrophy with moderate chronic small-vessel ischemic changes affecting the deep and subcortical white matter. No mass lesion, hemorrhage, hydrocephalus or extra-axial collection. No visible change since the previous study. Vascular: Major vessels at the base of the brain show flow. Skull and upper cervical spine: Negative Sinuses/Orbits: Clear/ normal. Small amount of mastoid air cell fluid. Other: None significant MRA HEAD FINDINGS Both internal carotid artery's are patent through the skullbase and siphon regions. There is considerable atherosclerotic irregularity in the carotid siphon regions but without suspicion of flow limiting stenosis. Supraclinoid internal carotid arteries are normal. Anterior and middle cerebral arteries are widely patent. Fetal origin left PCA. Left vertebral artery is dominant. Small right vertebral artery show antegrade flow to PICA up. No flow an the right vertebral artery beyond PICA. Basilar artery shows some atherosclerotic irregularity without flow limiting stenosis. Anterior inferior cerebellar arteries, superior cerebellar arteries and posterior cerebral arteries are patent bilaterally. Left PCA takes fetal origin as noted previously. IMPRESSION: MRI head: No acute finding. 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 Moderate chronic small-vessel ischemic changes affecting the cerebral hemispheres as seen previously. MRA head: No change. Atherosclerotic change in both carotid siphon regions without flow limiting stenosis. No intracranial large or medium vessel anterior circulation abnormal finding. Small right vertebral artery which gives flow to right PICA but does not contribute to the basilar. Dominant left vertebral artery widely patent to the basilar. Atherosclerotic irregularity of the basilar artery without flow limiting stenosis. Electronically Signed   By: Paulina FusiMark  Shogry M.D.   On: 06/05/2016 07:43   Ct Head Code Stroke W/o Cm  Result Date:  06/04/2016 CLINICAL DATA:  Code stroke. Headache, confusion, speech difficulty beginning at 3 p.m. Status post LEFT carotid stent 1 week ago with subsequent foot numbness. History of hypertension, LEFT eye blindness, stroke. EXAM: CT HEAD WITHOUT CONTRAST TECHNIQUE: Contiguous axial images were obtained from the base of the skull through the vertex without intravenous contrast. COMPARISON:  MRI head and CTA HEAD April 18, 2016 FINDINGS: BRAIN: Stable moderate to severe ventriculomegaly on the basis of global parenchymal brain volume loss. Old RIGHT cerebellar infarct. No intraparenchymal hemorrhage, mass effect, midline shift or acute large vascular territory infarct. Moderate to severe stable ventriculomegaly on the basis of global parenchymal brain volume loss. No abnormal extra-axial fluid collections. Basal cisterns are patent. VASCULAR: Moderate calcific atherosclerosis of the carotid siphons. SKULL: No skull fracture. No significant scalp soft tissue swelling. SINUSES/ORBITS: The mastoid air-cells and included paranasal sinuses are well-aerated.Status post LEFT ocular lens implant. The included ocular globes and orbital contents are non-suspicious. OTHER: None. ASPECTS [facility]([place] Stroke Program Early CT Score) - Ganglionic level infarction (caudate, lentiform nuclei, internal capsule, insula, M1-M3 cortex): 7 - Supraganglionic infarction (M4-M6 cortex): 3 Total score (0-10 with 10 being normal): 10 IMPRESSION: 1. No acute intracranial process. Stable examination including mild to moderate chronic small vessel ischemic disease and old small RIGHT cerebellar infarct. 2. ASPECTS is 10. Critical Value/emergent results  were called by telephone at the time of interpretation on 06/04/2016 at 6:57 pm to Dr. Samuel Jester , who verbally acknowledged these results. Electronically Signed   By: Awilda Metro M.D.   On: 06/04/2016 18:59   I have independently reviewed the above radiologic studies.  Recent Lab  Findings: Lab Results  Component Value Date   WBC 6.2 06/07/2016   HGB 11.7 (L) 06/07/2016   HCT 36.1 06/07/2016   PLT 278 06/07/2016   GLUCOSE 95 06/07/2016   CHOL 216 (H) 06/05/2016   TRIG 128 06/05/2016   HDL 36 (L) 06/05/2016   LDLCALC 154 (H) 06/05/2016   ALT 14 06/07/2016   AST 23 06/07/2016   NA 141 06/07/2016   K 3.8 06/07/2016   CL 108 06/07/2016   CREATININE 0.81 06/07/2016   BUN 6 06/07/2016   CO2 26 06/07/2016   INR 1.02 06/07/2016   HGBA1C 5.4 06/05/2016   [name]  Result Date: 06/05/2016 CLINICAL DATA:  Acute presentation with headache, confusion and speech disturbance. Numbness in the feet. EXAM: MRI HEAD WITHOUT CONTRAST MRA HEAD WITHOUT CONTRAST TECHNIQUE: Multiplanar, multiecho pulse sequences of the brain and surrounding structures were obtained without intravenous contrast. Angiographic images of the head were obtained using MRA technique without contrast. COMPARISON:  CT 06/04/2016.  MRI 04/18/2016. FINDINGS: MRI HEAD FINDINGS Brain: Diffusion imaging does not show any acute or subacute infarction. The brainstem is normal. There is an old infarction in the inferior cerebellum on the right which is unchanged. Cerebral hemispheres show age related atrophy with moderate chronic small-vessel ischemic changes affecting the deep and subcortical white matter. No mass lesion, hemorrhage, hydrocephalus or extra-axial collection. No visible change since the previous study. Vascular: Major vessels at the base of the brain show flow. Skull and upper cervical spine: Negative Sinuses/Orbits: Clear/ normal. Small amount of mastoid air cell fluid. Other: None significant MRA HEAD FINDINGS Both internal carotid artery's are patent through the skullbase and siphon regions. There is considerable atherosclerotic irregularity in the carotid siphon regions but without suspicion of flow limiting stenosis. Supraclinoid internal carotid arteries are normal. Anterior and middle  cerebral arteries are widely patent. Fetal origin left PCA. Left vertebral artery is dominant. Small right vertebral artery show antegrade flow to PICA up. No flow an the right vertebral artery beyond PICA. Basilar artery shows some atherosclerotic irregularity without flow limiting stenosis. Anterior inferior cerebellar arteries, superior cerebellar arteries and posterior cerebral arteries are patent bilaterally. Left PCA takes fetal origin as noted previously. IMPRESSION: MRI head: No acute finding. 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 Moderate chronic small-vessel ischemic changes affecting the cerebral hemispheres as seen previously. MRA head: No change. Atherosclerotic change in both carotid siphon regions without flow limiting stenosis. No intracranial large or medium vessel anterior circulation abnormal finding. Small right vertebral artery which gives flow to right PICA but does not contribute to the basilar. Dominant left vertebral artery widely patent to the basilar. Atherosclerotic irregularity of the basilar artery without flow limiting stenosis. Electronically Signed   By: Paulina Fusi M.D.   On: 06/05/2016 07:43   Dg Chest Port 1 View  Result Date: 06/04/2016 CLINICAL DATA:  Feet numbness after LEFT carotid artery stenting. Headache and confusion. EXAM: PORTABLE CHEST 1 VIEW COMPARISON:  Chest radiograph Aug 17, 2004 FINDINGS: Cardiomediastinal silhouette is unremarkable for this low inspiratory examination with crowded vasculature markings. The lungs are clear without pleural effusions or focal consolidations. Trachea projects midline and there is no pneumothorax.  Included soft tissue planes and osseous structures are non-suspicious. IMPRESSION: No acute cardiopulmonary process for this low inspiratory portable examination. Electronically Signed   By: Awilda Metro M.D.   On: 06/04/2016 19:21   [name] Head/brain UJ Cm  Result Date: 06/05/2016 CLINICAL DATA:  Acute presentation  with headache, confusion and speech disturbance. Numbness in the feet. EXAM: MRI HEAD WITHOUT CONTRAST MRA HEAD WITHOUT CONTRAST TECHNIQUE: Multiplanar, multiecho pulse sequences of the brain and surrounding structures were obtained without intravenous contrast. Angiographic images of the head were obtained using MRA technique without contrast. COMPARISON:  CT 06/04/2016.  MRI 04/18/2016. FINDINGS: MRI HEAD FINDINGS Brain: Diffusion imaging does not show any acute or subacute infarction. The brainstem is normal. There is an old infarction in the inferior cerebellum on the right which is unchanged. Cerebral hemispheres show age related atrophy with moderate chronic small-vessel ischemic changes affecting the deep and subcortical white matter. No mass lesion, hemorrhage, hydrocephalus or extra-axial collection. No visible change since the previous study. Vascular: Major vessels at the base of the brain show flow. Skull and upper cervical spine: Negative Sinuses/Orbits: Clear/ normal. Small amount of mastoid air cell fluid. Other: None significant MRA HEAD FINDINGS Both internal carotid artery's are patent through the skullbase and siphon regions. There is considerable atherosclerotic irregularity in the carotid siphon regions but without suspicion of flow limiting stenosis. Supraclinoid internal carotid arteries are normal. Anterior and middle cerebral arteries are widely patent. Fetal origin left PCA. Left vertebral artery is dominant. Small right vertebral artery show antegrade flow to PICA up. No flow an the right vertebral artery beyond PICA. Basilar artery shows some atherosclerotic irregularity without flow limiting stenosis. Anterior inferior cerebellar arteries, superior cerebellar arteries and posterior cerebral arteries are patent bilaterally. Left PCA takes fetal origin as noted previously. IMPRESSION: MRI head: No acute finding. 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 Moderate chronic small-vessel  ischemic changes affecting the cerebral hemispheres as seen previously. MRA head: No change. Atherosclerotic change in both carotid siphon regions without flow limiting stenosis. No intracranial large or medium vessel anterior circulation abnormal finding. Small right vertebral artery which gives flow to right PICA but does not contribute to the basilar. Dominant left vertebral artery widely patent to the basilar. Atherosclerotic irregularity of the basilar artery without flow limiting stenosis. Electronically Signed   By: Paulina Fusi M.D.   On: 06/05/2016 07:43   Ct Head Code Stroke W/o Cm  Result Date: 06/04/2016 CLINICAL DATA:  Code stroke. Headache, confusion, speech difficulty beginning at 3 p.m. Status post LEFT carotid stent 1 week ago with subsequent foot numbness. History of hypertension, LEFT eye blindness, stroke. EXAM: CT HEAD WITHOUT CONTRAST TECHNIQUE: Contiguous axial images were obtained from the base of the skull through the vertex without intravenous contrast. COMPARISON:  MRI head and CTA HEAD April 18, 2016 FINDINGS: BRAIN: Stable moderate to severe ventriculomegaly on the basis of global parenchymal brain volume loss. Old RIGHT cerebellar infarct. No intraparenchymal hemorrhage, mass effect, midline shift or acute large vascular territory infarct. Moderate to severe stable ventriculomegaly on the basis of global parenchymal brain volume loss. No abnormal extra-axial fluid collections. Basal cisterns are patent. VASCULAR: Moderate calcific atherosclerosis of the carotid siphons. SKULL: No skull fracture. No significant scalp soft tissue swelling. SINUSES/ORBITS: The mastoid air-cells and included paranasal sinuses are well-aerated.Status post LEFT ocular lens implant. The included ocular globes and orbital contents are non-suspicious. OTHER: None. ASPECTS [facility] Stroke Program Early CT Score) - Ganglionic level infarction (caudate, lentiform  nuclei, internal capsule, insula, M1-M3  cortex): 7 - Supraganglionic infarction (M4-M6 cortex): 3 Total score (0-10 with 10 being normal): 10 IMPRESSION: 1. No acute intracranial process. Stable examination including mild to moderate chronic small vessel ischemic disease and old small RIGHT cerebellar infarct. 2. ASPECTS is 10. Critical Value/emergent results were called by telephone at the time of interpretation on 06/04/2016 at 6:57 pm to Dr. Samuel Jester , who verbally acknowledged these results. Electronically Signed   By: Awilda Metro M.D.   On: 06/04/2016 18:59      Procedures   Left Heart Cath and Coronary Angiography  Conclusion     Prox RCA lesion, 80 %stenosed.  Mid RCA lesion, 100 %stenosed.  Ost Cx to Prox Cx lesion, 90 %stenosed.  Ost LAD lesion, 90 %stenosed.  LM lesion, 80 %stenosed.  Prox LAD lesion, 90 %stenosed.  Ost 1st Diag to 1st Diag lesion, 80 %stenosed.  Ost 2nd Mrg to 2nd Mrg lesion, 60 %stenosed.  LV end diastolic pressure is moderately elevated.   1. Severe three-vessel and distal left main disease. 2. Moderately elevated left ventricular end-diastolic pressure. 3. Left ventricular angiography was not performed. EF was 50-55% by echo.  Recommendations: I consulted CVTS for CABG. The patient has good targets. It was determined that she had no stroke during this admission. Avoid cardiac catheterization via the right radial artery in the future due to severe innominate artery tortuosity.   Indications   Non-ST elevation (NSTEMI) myocardial infarction (HCC) [I21.4 (ICD-10-CM)]  Procedural Details/Technique   Technical Details Procedural Details: The right wrist was prepped, draped, and anesthetized with 1% lidocaine. Using the modified Seldinger technique, a 5 French sheath was introduced into the right radial artery. 3mg  of verapamil was administered through the sheath, weight-based unfractionated heparin was administered intravenously.  The right innominate artery was severely  tortuous and thus I could not advance the catheter over an 035 wire. I used a run through wire to navigate the tortuosity and then was able to advance the catheter. The Jackie catheter could not engage the coronary artery. I used a standard Judkins catheters. A pigtail catheter was used for LV pressure. Catheter exchanges were performed over an exchange length guidewire. There were no immediate procedural complications. A TR band was used for radial hemostasis at the completion of the procedure. The patient was transferred to the post catheterization recovery area for further monitoring.   Estimated blood loss <50 mL.  During this procedure the patient was administered the following to achieve and maintain moderate conscious sedation: Fentanyl 25 mcg, while the patient's heart rate, blood pressure, and oxygen saturation were continuously monitored. The period of conscious sedation was 34 minutes, of which I was present face-to-face 100% of this time.    Coronary Findings   Dominance: Right  Left Main  LM lesion, 80% stenosed.  Left Anterior Descending  Ost LAD lesion, 90% stenosed.  Prox LAD lesion, 90% stenosed.  First Diagonal Branch  Vessel is large in size.  Ost 1st Diag to 1st Diag lesion, 80% stenosed.  Left Circumflex  Ost Cx to Prox Cx lesion, 90% stenosed.  First Obtuse Marginal Branch  Vessel is small in size.  Second Obtuse Marginal Branch  Ost 2nd Mrg to 2nd Mrg lesion, 60% stenosed.  Right Coronary Artery  Prox RCA lesion, 80% stenosed.  Mid RCA lesion, 100% stenosed.  Right Posterior Descending Artery  RPDA filled by collaterals from Dist LAD.  Left Heart   Left Ventricle LV end diastolic pressure  is moderately elevated.    Coronary Diagrams   Diagnostic Diagram     Implants     No implant documentation for this case.  PACS Images   Show images for Cardiac catheterization   Link to Procedure Log   Procedure Log    Hemo Data   Flowsheet Row Most Recent  Value  AO Systolic Pressure 131 mmHg  AO Diastolic Pressure 56 mmHg  AO Mean 83 mmHg  LV Systolic Pressure 135 mmHg  LV Diastolic Pressure 13 mmHg  LV EDP 25 mmHg  Arterial Occlusion Pressure Extended Systolic Pressure 135 mmHg  Arterial Occlusion Pressure Extended Diastolic Pressure 59 mmHg  Arterial Occlusion Pressure Extended Mean Pressure 89 mmHg  Left Ventricular Apex Extended Systolic Pressure 131 mmHg  Left Ventricular Apex Extended Diastolic Pressure 12 mmHg  Left Ventricular Apex Extended EDP Pressure 26 mmHg    I have independently reviewed the above  cath films and reviewed the findings with the  patient .    Study Result   Result status: Edited Result - FINAL                              *[place]*                   *[facility]*                         [address]                        [place], [place] [address]                            [phone]  ------------------------------------------------------------------- Transthoracic Echocardiography  (Report amended )  Patient:    Sheryl Carter, Sheryl Carter MR #:       [id number] Study Date: 06/05/2016 Gender:     F Age:        6 Height:     127 cm Weight:     83.2 kg BSA:        1.78 m^2 Pt. Status: Room:       4E05C   ATTENDING    Zannie Cove [id number]  ADMITTING    Meredeth Ide  SONOGRAPHER  Arvil Chaco  PERFORMING   [facility], Inpatient  ORDERING     Iran Ouch, [place] M  REFERRING    Iran Ouch [place] M  cc:  ------------------------------------------------------------------- LV EF: 45% -   50%  ------------------------------------------------------------------- History:   PMH:  Elevated troponin.  Coronary artery disease. Stroke.  Risk factors:  Hypertension. Dyslipidemia.  ------------------------------------------------------------------- Study Conclusions  - Left ventricle: Systolic function was mildly reduced. The   estimated ejection fraction was in the  range of 45% to 50%. There   is akinesis of the inferior and inferoseptal myocardium. Definity   contrast used. - Mitral valve: Calcified annulus.  Impressions:  - Consider Definity contrast study.  ------------------------------------------------------------------- Study data:  Comparison was made to the study of 04/19/2006.  Study status:  Routine.  Procedure:  The patient reported no pain pre or post test. Transthoracic echocardiography. Image quality was adequate.  Study completion:  There were no complications. Transthoracic echocardiography.  M-mode, limited 2D, limited spectral Doppler, and color Doppler.  Birthdate:  Patient birthdate: 1940-12-27.  Age:  Patient is 76 yr old.  Sex:  Gender: female.    BMI: 51.6 kg/m^2.  Blood pressure:     147/77  Patient status:  Inpatient.  Study date:  Study date: 06/05/2016. Study time: 04:27 PM.  Location:  Bedside.  -------------------------------------------------------------------  ------------------------------------------------------------------- Left ventricle:  Poorly visualized. Systolic function was mildly reduced. The estimated ejection fraction was in the range of 45% to 50%.  Regional wall motion abnormalities:  There is akinesis of the inferior and inferoseptal myocardium. Definity contrast used.  ------------------------------------------------------------------- Aortic valve:  Poorly visualized.  ------------------------------------------------------------------- Mitral valve:   Calcified annulus. Mobility was not restricted.  ------------------------------------------------------------------- Left atrium:  Poorly visualized.  ------------------------------------------------------------------- Right ventricle:  Poorly visualized.  ------------------------------------------------------------------- Pulmonic valve:   Poorly  visualized.  ------------------------------------------------------------------- Tricuspid valve:   Structurally normal valve.  ------------------------------------------------------------------- Pulmonary artery:   Poorly visualized.  ------------------------------------------------------------------- Right atrium:  Poorly visualized.  ------------------------------------------------------------------- Pericardium:  There was no pericardial effusion.  ------------------------------------------------------------------- Systemic veins:  Not well visualized.   ------------------------------------------------------------------- Venia Carbon, M.D. 2018-03-13T13:[id number]    Assessment / Plan:   The patient has severe Multivessel CAD with recent elevated troponin and EF 45-50 percent.  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 Moderate chronic small-vessel ischemic- by MRI of brain As noted patient did not intend on being classified a DO NOT RESUSCITATE, I discussed this in detail with her and her daughter her intention is to make her health care providers aware of her living will but is willing to undergo cardiac surgery.  I discussed with the patient and her daughter in detail the risks and options of coronary artery bypass grafting, with her symptoms and critical anatomy coronary artery bypass grafting has been recommended to her, but at slight increased risk due to her age and known cerebrovascular disease. She is willing to proceed.  We'll tentatively plan for surgery on Friday, we'll attempt to repeat the echo since the other one was of such poor quality to rule out any valvular heart disease before  surgery.  Both she and her daughter had their questions answered    I  spent 40 minutes counseling the patient face to face and 50% or more the  time was spent in counseling and coordination of care. The total time spent in the appointment was 30 minutes.  Delight Ovens MD      [address] [place].[address] [place] [address] Office [phone]   Beeper [phone]

## 2016-06-07 NOTE — Progress Notes (Signed)
Patient returned to unit from cath lab. Vitals remain stable. Neuro check intact and remains normal. Right radial access level 0 and elevated on 2 pillows above heart. Patient aware of the limitations of her RUE for the next 24 hours. Daughter at bedside and also aware. Diet order placed and meal tray ordered.  Leanna BattlesEckelmann, Kenadie Royce Eileen, RN.

## 2016-06-07 NOTE — H&P (View-Only) (Signed)
 Progress Note  Patient Name: Sheryl Carter Date of Encounter: 06/07/2016  Primary Cardiologist: Follow-Up in [place],    Subjective   No chest pain or palpitations. Breathing at baseline. No orthopnea or lower extremity edema.   Inpatient Medications    Scheduled Meds: . ALPRAZolam  0.125 mg Oral BID  . aspirin EC  325 mg Oral Daily  . enoxaparin (LOVENOX) injection  40 mg Subcutaneous Q24H  . metoprolol tartrate  25 mg Oral BID  . pantoprazole  40 mg Oral Daily  . sodium chloride flush  3 mL Intravenous Q12H  . timolol  1 drop Right Eye BID   Continuous Infusions: . sodium chloride    . [START ON 06/08/2016] sodium chloride 3 mL/kg/hr (06/07/16 0537)   Followed by  . [START ON 06/08/2016] sodium chloride 1 mL/kg/hr (06/07/16 0637)   PRN Meds: sodium chloride, oxyCODONE-acetaminophen, senna-docusate, sodium chloride flush, witch hazel-glycerin   Vital Signs    Vitals:   06/06/16 2110 06/06/16 2239 06/07/16 0359 06/07/16 0804  BP: 123/65 (!) 139/94 127/71 133/72  Pulse: 61 69 (!) 59 60  Resp: 17 14 17 16  Temp:  98.3 F (36.8 C) 97.3 F (36.3 C) 98.1 F (36.7 C)  TempSrc:  Oral Oral Oral  SpO2: 99% 97% 97% 100%  Weight:      Height:        Intake/Output Summary (Last 24 hours) at 06/07/16 0814 Last data filed at 06/07/16 0800  Gross per 24 hour  Intake           754.69 ml  Output             1800 ml  Net         -1045.31 ml   Filed Weights   06/04/16 1824 06/05/16 0125 06/06/16 1957  Weight: 183 lb (83 kg) 183 lb 6.8 oz (83.2 kg) 186 lb 8.2 oz (84.6 kg)    Telemetry    NSR, HR in mid-50's to 60's. No ectopic events.  - Personally Reviewed  ECG    06/04/2016: NSR, HR 92, with slight ST depression along inferior and lateral leads (simialr to prior tracings).  - Personally Reviewed  Physical Exam   General: Well developed, well nourished Caucasian female appearing in no acute distress. Head: Normocephalic, atraumatic.  Neck: Supple without  bruits, JVD not elevated. Lungs:  Resp regular and unlabored, CTA without wheezing or rales. Heart: RRR, S1, S2, no S3, S4, or murmur; no rub. Abdomen: Soft, non-tender, non-distended with normoactive bowel sounds. No hepatomegaly. No rebound/guarding. No obvious abdominal masses. Extremities: No clubbing, cyanosis, or edema. Distal pedal pulses are 2+ bilaterally. Strong radial pulses.  Neuro: Alert and oriented X 3. Moves all extremities spontaneously. Psych: Normal affect.  Labs    Chemistry  Recent Labs Lab 06/04/16 1834 06/04/16 1840 06/05/16 0222  NA 137 138  --   K 3.7 3.7  --   CL 100* 99*  --   CO2 30  --   --   GLUCOSE 114* 114*  --   BUN 8 5*  --   CREATININE 0.87 0.90 0.81  CALCIUM 9.5  --   --   PROT 6.8  --   --   ALBUMIN 3.4*  --   --   AST 28  --   --   ALT 17  --   --   ALKPHOS 65  --   --   BILITOT 0.4  --   --     GFRNONAA >60  --  >60  GFRAA >60  --  >60  ANIONGAP 7  --   --      Hematology  Recent Labs Lab 06/04/16 1834 06/04/16 1840 06/05/16 0222  WBC 7.2  --  5.9  RBC 4.02  --  3.89  HGB 12.7 12.2 11.8*  HCT 36.8 36.0 35.6*  MCV 91.5  --  91.5  MCH 31.6  --  30.3  MCHC 34.5  --  33.1  RDW 12.5  --  12.7  PLT 290  --  251    Cardiac Enzymes  Recent Labs Lab 06/05/16 0222 06/05/16 0806 06/05/16 1534  TROPONINI 3.12* 2.55* 2.19*     Recent Labs Lab 06/04/16 1839 06/04/16 2033  TROPIPOC 3.32* 2.93*     BNPNo results for input(s): BNP, PROBNP in the last 168 hours.   DDimer No results for input(s): DDIMER in the last 168 hours.   Radiology    [name]  Result Date: 06/05/2016 CLINICAL DATA:  Acute presentation with headache, confusion and speech disturbance. Numbness in the feet. EXAM: MRI HEAD WITHOUT CONTRAST MRA HEAD WITHOUT CONTRAST TECHNIQUE: Multiplanar, multiecho pulse sequences of the brain and surrounding structures were obtained without intravenous contrast. Angiographic images of the head were  obtained using MRA technique without contrast. COMPARISON:  CT 06/04/2016.  MRI 04/18/2016. FINDINGS: MRI HEAD FINDINGS Brain: Diffusion imaging does not show any acute or subacute infarction. The brainstem is normal. There is an old infarction in the inferior cerebellum on the right which is unchanged. Cerebral hemispheres show age related atrophy with moderate chronic small-vessel ischemic changes affecting the deep and subcortical white matter. No mass lesion, hemorrhage, hydrocephalus or extra-axial collection. No visible change since the previous study. Vascular: Major vessels at the base of the brain show flow. Skull and upper cervical spine: Negative Sinuses/Orbits: Clear/ normal. Small amount of mastoid air cell fluid. Other: None significant MRA HEAD FINDINGS Both internal carotid artery's are patent through the skullbase and siphon regions. There is considerable atherosclerotic irregularity in the carotid siphon regions but without suspicion of flow limiting stenosis. Supraclinoid internal carotid arteries are normal. Anterior and middle cerebral arteries are widely patent. Fetal origin left PCA. Left vertebral artery is dominant. Small right vertebral artery show antegrade flow to PICA up. No flow an the right vertebral artery beyond PICA. Basilar artery shows some atherosclerotic irregularity without flow limiting stenosis. Anterior inferior cerebellar arteries, superior cerebellar arteries and posterior cerebral arteries are patent bilaterally. Left PCA takes fetal origin as noted previously. IMPRESSION: MRI head: No acute finding. 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 Moderate chronic small-vessel ischemic changes affecting the cerebral hemispheres as seen previously. MRA head: No change. Atherosclerotic change in both carotid siphon regions without flow limiting stenosis. No intracranial large or medium vessel anterior circulation abnormal finding. Small right vertebral artery which gives  flow to right PICA but does not contribute to the basilar. Dominant left vertebral artery widely patent to the basilar. Atherosclerotic irregularity of the basilar artery without flow limiting stenosis. Electronically Signed   By: Mark  Shogry M.D.   On: 06/05/2016 07:43   Dg Chest Port 1 View  Result Date: 06/04/2016 CLINICAL DATA:  Feet numbness after LEFT carotid artery stenting. Headache and confusion. EXAM: PORTABLE CHEST 1 VIEW COMPARISON:  Chest radiograph Aug 17, 2004 FINDINGS: Cardiomediastinal silhouette is unremarkable for this low inspiratory examination with crowded vasculature markings. The lungs are clear without pleural effusions or focal consolidations. Trachea projects   midline and there is no pneumothorax. Included soft tissue planes and osseous structures are non-suspicious. IMPRESSION: No acute cardiopulmonary process for this low inspiratory portable examination. Electronically Signed   By: Courtnay  Bloomer M.D.   On: 06/04/2016 19:21   Mr Mra Head/brain Wo Cm  Result Date: 06/05/2016 CLINICAL DATA:  Acute presentation with headache, confusion and speech disturbance. Numbness in the feet. EXAM: MRI HEAD WITHOUT CONTRAST MRA HEAD WITHOUT CONTRAST TECHNIQUE: Multiplanar, multiecho pulse sequences of the brain and surrounding structures were obtained without intravenous contrast. Angiographic images of the head were obtained using MRA technique without contrast. COMPARISON:  CT 06/04/2016.  MRI 04/18/2016. FINDINGS: MRI HEAD FINDINGS Brain: Diffusion imaging does not show any acute or subacute infarction. The brainstem is normal. There is an old infarction in the inferior cerebellum on the right which is unchanged. Cerebral hemispheres show age related atrophy with moderate chronic small-vessel ischemic changes affecting the deep and subcortical white matter. No mass lesion, hemorrhage, hydrocephalus or extra-axial collection. No visible change since the previous study. Vascular: Major  vessels at the base of the brain show flow. Skull and upper cervical spine: Negative Sinuses/Orbits: Clear/ normal. Small amount of mastoid air cell fluid. Other: None significant MRA HEAD FINDINGS Both internal carotid artery's are patent through the skullbase and siphon regions. There is considerable atherosclerotic irregularity in the carotid siphon regions but without suspicion of flow limiting stenosis. Supraclinoid internal carotid arteries are normal. Anterior and middle cerebral arteries are widely patent. Fetal origin left PCA. Left vertebral artery is dominant. Small right vertebral artery show antegrade flow to PICA up. No flow an the right vertebral artery beyond PICA. Basilar artery shows some atherosclerotic irregularity without flow limiting stenosis. Anterior inferior cerebellar arteries, superior cerebellar arteries and posterior cerebral arteries are patent bilaterally. Left PCA takes fetal origin as noted previously. IMPRESSION: MRI head: No acute finding. 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 Moderate chronic small-vessel ischemic changes affecting the cerebral hemispheres as seen previously. MRA head: No change. Atherosclerotic change in both carotid siphon regions without flow limiting stenosis. No intracranial large or medium vessel anterior circulation abnormal finding. Small right vertebral artery which gives flow to right PICA but does not contribute to the basilar. Dominant left vertebral artery widely patent to the basilar. Atherosclerotic irregularity of the basilar artery without flow limiting stenosis. Electronically Signed   By: Mark  Shogry M.D.   On: 06/05/2016 07:43   Ct Head Code Stroke W/o Cm  Result Date: 06/04/2016 CLINICAL DATA:  Code stroke. Headache, confusion, speech difficulty beginning at 3 p.m. Status post LEFT carotid stent 1 week ago with subsequent foot numbness. History of hypertension, LEFT eye blindness, stroke. EXAM: CT HEAD WITHOUT CONTRAST  TECHNIQUE: Contiguous axial images were obtained from the base of the skull through the vertex without intravenous contrast. COMPARISON:  MRI head and CTA HEAD April 18, 2016 FINDINGS: BRAIN: Stable moderate to severe ventriculomegaly on the basis of global parenchymal brain volume loss. Old RIGHT cerebellar infarct. No intraparenchymal hemorrhage, mass effect, midline shift or acute large vascular territory infarct. Moderate to severe stable ventriculomegaly on the basis of global parenchymal brain volume loss. No abnormal extra-axial fluid collections. Basal cisterns are patent. VASCULAR: Moderate calcific atherosclerosis of the carotid siphons. SKULL: No skull fracture. No significant scalp soft tissue swelling. SINUSES/ORBITS: The mastoid air-cells and included paranasal sinuses are well-aerated.Status post LEFT ocular lens implant. The included ocular globes and orbital contents are non-suspicious. OTHER: None. ASPECTS ([place] Stroke Program Early CT Score) -   Ganglionic level infarction (caudate, lentiform nuclei, internal capsule, insula, M1-M3 cortex): 7 - Supraganglionic infarction (M4-M6 cortex): 3 Total score (0-10 with 10 being normal): 10 IMPRESSION: 1. No acute intracranial process. Stable examination including mild to moderate chronic small vessel ischemic disease and old small RIGHT cerebellar infarct. 2. ASPECTS is 10. Critical Value/emergent results were called by telephone at the time of interpretation on 06/04/2016 at 6:57 pm to Dr. KATHLEEN MCMANUS , who verbally acknowledged these results. Electronically Signed   By: Courtnay  Bloomer M.D.   On: 06/04/2016 18:59    Cardiac Studies   Echocardiogram: 04/19/2016 Study Conclusions  - Left ventricle: The cavity size was normal. Wall thickness was increased in a pattern of mild LVH. Systolic function was normal. The estimated ejection fraction was in the range of 60% to 65%. Wall motion was normal; there were no regional wall  motion abnormalities. The study is not technically sufficient to allow evaluation of LV diastolic function. - Aortic valve: Mildly calcified annulus. Trileaflet. - Mitral valve: Calcified annulus. - Atrial septum: No defect or patent foramen ovale was identified. - Tricuspid valve: There was trivial regurgitation. - Pulmonary arteries: Systolic pressure could not be accurately estimated. - Pericardium, extracardiac: A prominent pericardial fat pad was present.  Impressions:  - Mild LVH with LVEF 60-65%. Indeterminate diastolic function. Mildly calcified mitral regurgitation. Trivial tricuspid regurgitation. Unable to assess PASP. No obvious PFO or ASD.  Limited Echo: 06/05/2016  Study Conclusions  - Left ventricle: Systolic function was normal. The estimated   ejection fraction was in the range of 50% to 55%. Possible   hypokinesis of the inferior myocardium. - Mitral valve: Calcified annulus.  Impressions:  - Consider Definity contrast study.  Patient Profile     75 y.o. female w/ PMH of HTN, HLD, Glaucoma, and recent left carotid endarterectomy (05/29/2016) admitted for possible TIA. Cards consulted for elevated troponin.   Assessment & Plan     1. Elevated Troponin/ Abnormal Echocardiogram - presented with a headache, slurred speech and lower extremity numbness. Unclear why a Troponin was initially checked as she denies any recent chest discomfort or dyspnea with exertion.  - echo in 03/2016 showed a preserved EF of 60-65%, no WMA, trivial TR, and a prominent pericardiac fat pad. - Initial troponin at 3.32, with repeat values at 2.93, 3.12, 2.55, and 2.19. EKG with NSR, HR 92, and slight ST depression along inferior and lateral leads (similar to prior tracings).   - limited echo shows a slightly reduced EF of 50-55% with possible hypokinesis of the inferior myocardium. With elevated troponin values and WMA on echo, an ischemic evaluation is warranted. Echo  with definity contrast confirmed WMA. Plan is for a cardiac catheterization this AM. The patient understands that risks included but are not limited to stroke (1 in 1000), death (1 in 1000), kidney failure [usually temporary] (1 in 500), bleeding (1 in 200), allergic reaction [possibly serious] (1 in 200). Has been NPO since midnight.  - continue ASA, statin, and BB (do not further titrate with HR in the mid-50's at times).   2. Carotid Artery Disease - underwent recent left carotid endarterectomy on 05/29/2016. - repeat carotid doppler study showed 1-39% stenosis bilaterally.   3. HTN - BP at 121/65 - 139/94 in the past 24 hours.  - was on Lisinopril-HCTZ 10-12.5mg daily prior to admission. This has been held and she has since been started on Lopressor 25mg BID in the setting of her elevated troponin.   4. HLD -   Lipid Panel this admission shows total cholesterol 216, HDL 36, and LDL 154. - started on Pravastatin 20mg daily. Intolerant to high-intensity statin therapy in the past.   5. TIA/ Prior CVA - CT Imaging showed no acute intracranial processes with stable examination of the mild to moderate small vessel disease and an old small right cerebellar infarct. MRI with similar findings.  - Neurology following.    Signed, Brittany M Strader , PA-C 8:14 AM 06/07/2016 Pager: [phone]  I have seen and examined the patient along with Brittany M Strader , PA-C.  I have reviewed the chart, notes and new data.  I agree with PA's note.  Key new complaints: no angina, no new neuro complaints Key examination changes: no overt HF, no significant arrhythmia Key new findings / data: reviewed follow up echo with patient and family  PLAN: For cath/possible PCI today. This procedure has been fully reviewed with the patient and written informed consent has been obtained.   Jaycee Pelzer, MD, FACC [facility] [phone] 06/07/2016, 10:22 AM  

## 2016-06-07 NOTE — Progress Notes (Signed)
PROGRESS NOTE    Sheryl Carter  PIR:[id number] DOB: 05/23/1940 DOA: 06/04/2016 PCP: Cassell SmilesFUSCO,LAWRENCE J., MD Brief Narrative:Sheryl Carter  is a 76 y.o. female, With a history of recent left carotid endarterectomy on 05/29/2016, hypertension, hyperlipidemia, blindness in left eye, glaucoma was brought to the hospital after patient had sudden speech impairment, bilateral feet numbness and a headache.In the ED lab work showed elevated troponin 3.32, CT head unremarkable. Ruled out for CVA, undergoing cardiac eval for elevated troponin/abnormal ECHO  Assessment & Plan:   1. Complicated migraine vs anxiety -MRI negative for Acute CVA -symptoms resolved -appreciate Neuro input, EEG negative for epileptiform actvity, continue aspirin on discharge  2. 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 Planning for cardiac bypass on Friday. We will continue to monitor in the step down unit at present. Based on the discussion with a thoracic surgery CODE STATUS has been changed to full code.  3. recent left carotid endarterectomy on 05/29/2016 -continue ASA and statin, MRI without acute findings, carotid duplex with 1-39% stenosis  4. Glaucoma -continue eye drops  5. Anxiety -continue xanax PRN  Code status: Based on the discussion with a thoracic surgery the CODE STATUS has been changed to full code. Patient has a living will which states that she would not prefer to be sustained on life supporting measures artificially but she would like to get resuscitated if need arises.  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 No dizziness or lightheadedness but has some mild shortness of breath.  Objective: Vitals:   06/07/16 1700 06/07/16 1710 06/07/16 1731 06/07/16 1930  BP: (!) 169/69 (!) 113/57 (!) 143/68 132/70  Pulse: 61 67 64 72  Resp:   20 18  Temp:   98.3 F (36.8 C) 98.1 F (36.7 C)  TempSrc:   Oral Oral  SpO2: 96% 95% 96% 96%  Weight:      Height:        Intake/Output Summary (Last 24 hours) at 06/07/16 1938 Last data filed at 06/07/16 1739  Gross per 24 hour  Intake          1537.57 ml  Output             1200 ml  Net           337.57 ml   Filed Weights   06/04/16 1824 06/05/16 0125 06/06/16 1957  Weight: 83 kg (183 lb) 83.2 kg (183 lb 6.8 oz) 84.6 kg (186 lb 8.2 oz)    Examination:  General exam: Obese, AAOx3 HEENT: L neck incision dry Respiratory system: Clear to auscultation. Respiratory effort normal. Cardiovascular system: S1 & S2 heard, RRR. No JVD,  Gastrointestinal system: Abdomen is nondistended, soft and nontender. Normal bowel sounds heard. Central nervous system: Alert and oriented. No focal neurological deficits. Extremities: Symmetric 5 x 5 power. Skin: No rashes, lesions or ulcers Psychiatry: Judgement and insight appear normal. Mood & affect appropriate.     Data  Reviewed: I have personally reviewed following labs and imaging studies  CBC:  Recent Labs Lab 06/04/16 1834 06/04/16 1840 06/05/16 0222 06/07/16 0922  WBC 7.2  --  5.9 6.2  NEUTROABS 3.6  --   --  2.7  HGB 12.7 12.2 11.8* 11.7*  HCT 36.8 36.0 35.6* 36.1  MCV 91.5  --  91.5 93.3  PLT 290  --  251 278   Basic Metabolic Panel:  Recent Labs Lab 06/04/16 1834 06/04/16 1840 06/05/16 0222 06/07/16 0922  NA 137 138  --  141  K 3.7 3.7  --  3.8  CL 100* 99*  --  108  CO2 30  --   --   26  GLUCOSE 114* 114*  --  95  BUN 8 5*  --  6  CREATININE 0.87 0.90 0.81 0.81  CALCIUM 9.5  --   --  9.0   GFR: Estimated Creatinine Clearance: 50.6 mL/min (by C-G formula based on SCr of 0.81 mg/dL). Liver Function Tests:  Recent Labs Lab 06/04/16 1834 06/07/16 0922  AST 28 23  ALT 17 14  ALKPHOS 65 56  BILITOT 0.4 0.5  PROT 6.8 5.6*  ALBUMIN 3.4* 3.0*   No results for input(s): LIPASE, AMYLASE in the last 168 hours. No results for input(s): AMMONIA in the last 168 hours. Coagulation Profile:  Recent Labs Lab 06/04/16 1834 06/07/16 0922  INR 0.94 1.02   Cardiac Enzymes:  Recent Labs Lab 06/05/16 0222 06/05/16 0806 06/05/16 1534  TROPONINI 3.12* 2.55* 2.19*   BNP (last 3 results) No results for input(s): PROBNP in the last 8760 hours. HbA1C:  Recent Labs  06/05/16 0222  HGBA1C 5.4   CBG:  Recent Labs Lab 06/04/16 1829 06/05/16 1850  GLUCAP 116* 113*   Lipid Profile:  Recent Labs  06/05/16 0222  CHOL 216*  HDL 36*  LDLCALC 154*  TRIG 128  CHOLHDL 6.0   Thyroid Function Tests: No results for input(s): TSH, T4TOTAL, FREET4, T3FREE, THYROIDAB in the last 72 hours. Anemia Panel: No results for input(s): VITAMINB12, FOLATE, FERRITIN, TIBC, IRON, RETICCTPCT in the last 72 hours. Urine analysis:    Component Value Date/Time   COLORURINE COLORLESS (A) 06/04/2016 1909   APPEARANCEUR CLEAR 06/04/2016 1909   LABSPEC 1.002 (L) 06/04/2016 1909   PHURINE 8.0 06/04/2016 1909   GLUCOSEU NEGATIVE 06/04/2016 1909   HGBUR NEGATIVE 06/04/2016 1909   BILIRUBINUR NEGATIVE 06/04/2016 1909   KETONESUR NEGATIVE 06/04/2016 1909   PROTEINUR NEGATIVE 06/04/2016 1909   NITRITE NEGATIVE 06/04/2016 1909   LEUKOCYTESUR NEGATIVE 06/04/2016 1909   Sepsis Labs: @LABRCNTIP (procalcitonin:4,lacticidven:4)  )No results found for this or any previous visit (from the past 240 hour(s)).       Radiology Studies: No results found.      Scheduled Meds: .  ALPRAZolam  0.125 mg Oral BID  . aspirin EC  325 mg Oral Daily  . enoxaparin (LOVENOX) injection  40 mg Subcutaneous Q24H  . metoprolol tartrate  25 mg Oral BID  . pantoprazole  40 mg Oral Daily  . sodium chloride flush  3 mL Intravenous Q12H  . timolol  1 drop Right Eye BID   Continuous Infusions: . sodium chloride    . sodium chloride 75 mL/hr at 06/07/16 1739     LOS: 0 days    Time spent: Author:  Lynden Oxford, MD [facility] Pager: [phone] 06/07/2016 7:42 PM     If 7PM-7AM, please contact night-coverage [email or web address] Password [facility] 06/07/2016, 7:38 PM

## 2016-06-07 NOTE — Interval H&P Note (Signed)
Cath Lab Visit (complete for each Cath Lab visit)  Clinical Evaluation Leading to the Procedure:   ACS: Yes.    Non-ACS:  N/a      History and Physical Interval Note:  06/07/2016 2:07 PM  Sheryl Carter  has presented today for surgery, with the diagnosis of n stemi  The various methods of treatment have been discussed with the patient and family. After consideration of risks, benefits and other options for treatment, the patient has consented to  Procedure(s): Left Heart Cath and Coronary Angiography (N/A) as a surgical intervention .  The patient's history has been reviewed, patient examined, no change in status, stable for surgery.  I have reviewed the patient's chart and labs.  Questions were answered to the patient's satisfaction.     Lorine BearsMuhammad Arida

## 2016-06-08 ENCOUNTER — Encounter: Payer: Self-pay | Admitting: Vascular Surgery

## 2016-06-08 ENCOUNTER — Encounter (HOSPITAL_COMMUNITY): Payer: Self-pay | Admitting: Cardiovascular Disease

## 2016-06-08 ENCOUNTER — Inpatient Hospital Stay (HOSPITAL_COMMUNITY): Payer: Medicare HMO

## 2016-06-08 ENCOUNTER — Other Ambulatory Visit: Payer: Self-pay | Admitting: *Deleted

## 2016-06-08 DIAGNOSIS — I10 Essential (primary) hypertension: Secondary | ICD-10-CM

## 2016-06-08 DIAGNOSIS — I2510 Atherosclerotic heart disease of native coronary artery without angina pectoris: Secondary | ICD-10-CM

## 2016-06-08 DIAGNOSIS — I6522 Occlusion and stenosis of left carotid artery: Secondary | ICD-10-CM

## 2016-06-08 DIAGNOSIS — R9431 Abnormal electrocardiogram [ECG] [EKG]: Secondary | ICD-10-CM

## 2016-06-08 DIAGNOSIS — I25708 Atherosclerosis of coronary artery bypass graft(s), unspecified, with other forms of angina pectoris: Secondary | ICD-10-CM

## 2016-06-08 DIAGNOSIS — Z01810 Encounter for preprocedural cardiovascular examination: Secondary | ICD-10-CM

## 2016-06-08 LAB — COMPREHENSIVE METABOLIC PANEL
ALT: 13 U/L — ABNORMAL LOW (ref 14–54)
AST: 20 U/L (ref 15–41)
Albumin: 2.8 g/dL — ABNORMAL LOW (ref 3.5–5.0)
Alkaline Phosphatase: 51 U/L (ref 38–126)
Anion gap: 7 (ref 5–15)
BUN: 7 mg/dL (ref 6–20)
CO2: 24 mmol/L (ref 22–32)
Calcium: 8.8 mg/dL — ABNORMAL LOW (ref 8.9–10.3)
Chloride: 108 mmol/L (ref 101–111)
Creatinine, Ser: 0.83 mg/dL (ref 0.44–1.00)
GFR calc Af Amer: >60 mL/min (ref >60)
GFR calc non Af Amer: >60 mL/min (ref >60)
Glucose, Bld: 91 mg/dL (ref 65–99)
Potassium: 3.5 mmol/L (ref 3.5–5.1)
Sodium: 139 mmol/L (ref 135–145)
Total Bilirubin: 0.7 mg/dL (ref 0.3–1.2)
Total Protein: 5.3 g/dL — ABNORMAL LOW (ref 6.5–8.1)

## 2016-06-08 LAB — SPIROMETRY WITH GRAPH
FEF 25-75 Post: 1.01 L/sec
FEF 25-75 Pre: 0.91 L/sec
FEF2575-%Change-Post: 10 %
FEF2575-%Pred-Post: 90 %
FEF2575-%Pred-Pre: 82 %
FEV1-%Change-Post: 1 %
FEV1-%Pred-Post: 73 %
FEV1-%Pred-Pre: 72 %
FEV1-Post: 0.95 L
FEV1-Pre: 0.93 L
FEV1FVC-%Change-Post: -4 %
FEV1FVC-%Pred-Pre: 109 %
FEV6-%Change-Post: 7 %
FEV6-%Pred-Post: 74 %
FEV6-%Pred-Pre: 69 %
FEV6-Post: 1.23 L
FEV6-Pre: 1.15 L
FEV6FVC-%Pred-Post: 106 %
FEV6FVC-%Pred-Pre: 106 %
FVC-%Change-Post: 7 %
FVC-%Pred-Post: 69 %
FVC-%Pred-Pre: 64 %
FVC-Post: 1.23 L
FVC-Pre: 1.15 L
Post FEV1/FVC ratio: 77 %
Post FEV6/FVC ratio: 100 %
Pre FEV1/FVC ratio: 81 %
Pre FEV6/FVC Ratio: 100 %

## 2016-06-08 LAB — ECHOCARDIOGRAM LIMITED
Height: 55 in
Weight: 2984.15 oz

## 2016-06-08 LAB — CBC
HCT: 35.1 % — ABNORMAL LOW (ref 36.0–46.0)
Hemoglobin: 11.8 g/dL — ABNORMAL LOW (ref 12.0–15.0)
MCH: 31.4 pg (ref 26.0–34.0)
MCHC: 33.6 g/dL (ref 30.0–36.0)
MCV: 93.4 fL (ref 78.0–100.0)
PLATELETS: 267 10*3/uL (ref 150–400)
RBC: 3.76 MIL/uL — AB (ref 3.87–5.11)
RDW: 13.1 % (ref 11.5–15.5)
WBC: 6.4 10*3/uL (ref 4.0–10.5)

## 2016-06-08 LAB — APTT: aPTT: 34 seconds (ref 24–36)

## 2016-06-08 MED ORDER — TRANEXAMIC ACID (OHS) BOLUS VIA INFUSION
15.0000 mg/kg | INTRAVENOUS | Status: AC
  Administered 2016-06-09: 1269 mg via INTRAVENOUS
  Filled 2016-06-08: qty 1269

## 2016-06-08 MED ORDER — TRANEXAMIC ACID 1000 MG/10ML IV SOLN
1.5000 mg/kg/h | INTRAVENOUS | Status: AC
  Administered 2016-06-09: 1.5 mg/kg/h via INTRAVENOUS
  Filled 2016-06-08: qty 25

## 2016-06-08 MED ORDER — PLASMA-LYTE 148 IV SOLN
INTRAVENOUS | Status: AC
  Administered 2016-06-09: 500 mL
  Filled 2016-06-08: qty 2.5

## 2016-06-08 MED ORDER — MAGNESIUM SULFATE 50 % IJ SOLN
40.0000 meq | INTRAMUSCULAR | Status: DC
  Filled 2016-06-08: qty 10

## 2016-06-08 MED ORDER — CHLORHEXIDINE GLUCONATE 0.12 % MT SOLN
15.0000 mL | Freq: Once | OROMUCOSAL | Status: AC
  Administered 2016-06-09: 15 mL via OROMUCOSAL
  Filled 2016-06-08: qty 15

## 2016-06-08 MED ORDER — SODIUM CHLORIDE 0.9 % IV SOLN
INTRAVENOUS | Status: DC
  Filled 2016-06-08: qty 30

## 2016-06-08 MED ORDER — DEXMEDETOMIDINE HCL IN NACL 400 MCG/100ML IV SOLN
0.1000 ug/kg/h | INTRAVENOUS | Status: AC
Start: 2016-06-09 — End: 2016-06-09
  Administered 2016-06-09: .3 ug/kg/h via INTRAVENOUS
  Filled 2016-06-08: qty 100

## 2016-06-08 MED ORDER — EPINEPHRINE PF 1 MG/ML IJ SOLN
0.0000 ug/min | INTRAVENOUS | Status: DC
  Filled 2016-06-08: qty 4

## 2016-06-08 MED ORDER — DEXTROSE 5 % IV SOLN
750.0000 mg | INTRAVENOUS | Status: DC
  Filled 2016-06-08: qty 750

## 2016-06-08 MED ORDER — POTASSIUM CHLORIDE 2 MEQ/ML IV SOLN
80.0000 meq | INTRAVENOUS | Status: DC
  Filled 2016-06-08: qty 40

## 2016-06-08 MED ORDER — SODIUM CHLORIDE 0.9 % IV SOLN
INTRAVENOUS | Status: AC
  Administered 2016-06-09: .9 [IU]/h via INTRAVENOUS
  Filled 2016-06-08: qty 2.5

## 2016-06-08 MED ORDER — BISACODYL 5 MG PO TBEC
5.0000 mg | DELAYED_RELEASE_TABLET | Freq: Once | ORAL | Status: AC
  Administered 2016-06-08: 5 mg via ORAL
  Filled 2016-06-08: qty 1

## 2016-06-08 MED ORDER — ALBUTEROL SULFATE (2.5 MG/3ML) 0.083% IN NEBU
2.5000 mg | INHALATION_SOLUTION | Freq: Once | RESPIRATORY_TRACT | Status: AC
  Administered 2016-06-08: 2.5 mg via RESPIRATORY_TRACT

## 2016-06-08 MED ORDER — PHENYLEPHRINE HCL 10 MG/ML IJ SOLN
30.0000 ug/min | INTRAMUSCULAR | Status: AC
  Administered 2016-06-09: 25 ug/min via INTRAVENOUS
  Filled 2016-06-08: qty 2

## 2016-06-08 MED ORDER — VANCOMYCIN HCL 10 G IV SOLR
1500.0000 mg | INTRAVENOUS | Status: AC
  Administered 2016-06-09: 1500 mg via INTRAVENOUS
  Filled 2016-06-08: qty 1500

## 2016-06-08 MED ORDER — NITROGLYCERIN IN D5W 200-5 MCG/ML-% IV SOLN
2.0000 ug/min | INTRAVENOUS | Status: DC
  Filled 2016-06-08: qty 250

## 2016-06-08 MED ORDER — TEMAZEPAM 15 MG PO CAPS
15.0000 mg | ORAL_CAPSULE | Freq: Once | ORAL | Status: AC | PRN
  Administered 2016-06-08: 15 mg via ORAL
  Filled 2016-06-08: qty 1

## 2016-06-08 MED ORDER — DEXTROSE 5 % IV SOLN
1.5000 g | INTRAVENOUS | Status: AC
  Administered 2016-06-09: 1.5 g via INTRAVENOUS
  Administered 2016-06-09: .75 g via INTRAVENOUS
  Filled 2016-06-08: qty 1.5

## 2016-06-08 MED ORDER — TRANEXAMIC ACID (OHS) PUMP PRIME SOLUTION
2.0000 mg/kg | INTRAVENOUS | Status: DC
  Filled 2016-06-08: qty 1.69

## 2016-06-08 MED ORDER — PERFLUTREN LIPID MICROSPHERE
1.0000 mL | INTRAVENOUS | Status: AC | PRN
  Administered 2016-06-08: 2 mL via INTRAVENOUS
  Filled 2016-06-08: qty 10

## 2016-06-08 MED ORDER — CHLORHEXIDINE GLUCONATE CLOTH 2 % EX PADS
6.0000 | MEDICATED_PAD | Freq: Once | CUTANEOUS | Status: AC
  Administered 2016-06-09: 6 via TOPICAL

## 2016-06-08 MED ORDER — METOPROLOL TARTRATE 12.5 MG HALF TABLET: 12.5000 mg | ORAL_TABLET | Freq: Once | ORAL | Status: DC

## 2016-06-08 MED ORDER — DOPAMINE-DEXTROSE 3.2-5 MG/ML-% IV SOLN
0.0000 ug/kg/min | INTRAVENOUS | Status: AC
  Administered 2016-06-09: 2 ug/kg/min via INTRAVENOUS
  Filled 2016-06-08: qty 250

## 2016-06-08 MED ORDER — MORPHINE SULFATE (PF) 4 MG/ML IV SOLN: 3.0000 mg | Freq: Once | INTRAVENOUS | Status: DC

## 2016-06-08 MED ORDER — CHLORHEXIDINE GLUCONATE CLOTH 2 % EX PADS
6.0000 | MEDICATED_PAD | Freq: Once | CUTANEOUS | Status: AC
  Administered 2016-06-08: 6 via TOPICAL

## 2016-06-08 NOTE — Progress Notes (Signed)
Pre-op Cardiac Surgery  Carotid Findings:  Completed 06/05/2016 on a previous admission. Results in EPIC Bilateral 1% to 39% ICA stenosis. Vertebral artery flow is antegrade.  Upper Extremity Right Left  Brachial Pressures 134 Triphasic 146 Triphasic  Radial Waveforms Triphasic Triphasic  Ulnar Waveforms Triphasic Triphasic  Palmar Arch (Allen's Test) Normal Abnormal   Findings:  Doppler waveforms on the right remained normal with both radial and ulnar compression. Left Doppler waveforms remained normal with radial compression and diminished greater than 50% with ulnar compression.    Lower  Extremity Right Left  Dorsalis Pedis 147 Triphasic 136 Triphasic  Posterior Tibial 142 triphasic 136 Triphasic  Ankle/Brachial Indices 1.01 0.93    Findings:  ABIs and Doppler waveforms indicate normal arterial flow bilaterally at rest.  Sheryl DeitersVirginia Kyair Carter, RVT 06/08/2016 03:05 PM

## 2016-06-08 NOTE — Progress Notes (Signed)
Progress Note  Patient Name: Sheryl Carter Date of Encounter: 06/08/2016  Primary Cardiologist: New, plan f/u in [place]  Subjective   No angina or dyspnea. No problems at cath site.  Inpatient Medications    Scheduled Meds: . ALPRAZolam  0.125 mg Oral BID  . aspirin EC  325 mg Oral Daily  . enoxaparin (LOVENOX) injection  40 mg Subcutaneous Q24H  . metoprolol tartrate  25 mg Oral BID  . pantoprazole  40 mg Oral Daily  . sodium chloride flush  3 mL Intravenous Q12H  . timolol  1 drop Right Eye BID   Continuous Infusions: . sodium chloride 10 mL/hr at 06/07/16 2152   PRN Meds: sodium chloride, oxyCODONE-acetaminophen, perflutren lipid microspheres (DEFINITY) IV suspension, senna-docusate, sodium chloride flush, witch hazel-glycerin   Vital Signs    Vitals:   06/07/16 2319 06/08/16 0258 06/08/16 0758 06/08/16 1230  BP: 127/76 119/63 (!) 143/66 126/74  Pulse: (!) 58 (!) 56 67 (!) 59  Resp: 19 18 20 18   Temp: 98.1 F (36.7 C) 98.2 F (36.8 C) 98.3 F (36.8 C) 97.8 F (36.6 C)  TempSrc: Oral Oral Oral Oral  SpO2: 94% 95% 96% 95%  Weight:      Height:        Intake/Output Summary (Last 24 hours) at 06/08/16 1257 Last data filed at 06/08/16 0900  Gross per 24 hour  Intake          1089.45 ml  Output              700 ml  Net           389.45 ml   Filed Weights   06/04/16 1824 06/05/16 0125 06/06/16 1957  Weight: 83 kg (183 lb) 83.2 kg (183 lb 6.8 oz) 84.6 kg (186 lb 8.2 oz)    Telemetry    NSR - Personally Reviewed  ECG    NSR, minor diffuse ST changes - Personally Reviewed  Physical Exam  Comfortable GEN: No acute distress.   Neck: No JVD Cardiac: RRR, no murmurs, rubs, or gallops.  Respiratory: Clear to auscultation bilaterally. GI: Soft, nontender, non-distended  MS: No edema; No deformity. Neuro:  Nonfocal  Psych: Normal affect   Labs    Chemistry Recent Labs Lab 06/04/16 1834 06/04/16 1840 06/05/16 0222 06/07/16 0922  06/08/16 0258  NA 137 138  --  141 139  K 3.7 3.7  --  3.8 3.5  CL 100* 99*  --  108 108  CO2 30  --   --  26 24  GLUCOSE 114* 114*  --  95 91  BUN 8 5*  --  6 7  CREATININE 0.87 0.90 0.81 0.81 0.83  CALCIUM 9.5  --   --  9.0 8.8*  PROT 6.8  --   --  5.6* 5.3*  ALBUMIN 3.4*  --   --  3.0* 2.8*  AST 28  --   --  23 20  ALT 17  --   --  14 13*  ALKPHOS 65  --   --  56 51  BILITOT 0.4  --   --  0.5 0.7  GFRNONAA >60  --  >60 >60 >60  GFRAA >60  --  >60 >60 >60  ANIONGAP 7  --   --  7 7     Hematology Recent Labs Lab 06/05/16 0222 06/07/16 0922 06/08/16 0258  WBC 5.9 6.2 6.4  RBC 3.89 3.87 3.76*  HGB 11.8* 11.7* 11.8*  HCT 35.6* 36.1 35.1*  MCV 91.5 93.3 93.4  MCH 30.3 30.2 31.4  MCHC 33.1 32.4 33.6  RDW 12.7 12.9 13.1  PLT 251 278 267    Cardiac Enzymes Recent Labs Lab 06/05/16 0222 06/05/16 0806 06/05/16 1534  TROPONINI 3.12* 2.55* 2.19*    Recent Labs Lab 06/04/16 1839 06/04/16 2033  TROPIPOC 3.32* 2.93*     BNPNo results for input(s): BNP, PROBNP in the last 168 hours.   DDimer No results for input(s): DDIMER in the last 168 hours.   Radiology    No results found.  Cardiac Studies   ECHO 06/05/16  - Left ventricle: Systolic function was mildly reduced. The estimated ejection fraction was in the range of 45% to 50%. There is akinesis of the inferior and inferoseptal myocardium. Definity   contrast used. - Mitral valve: Calcified annulus.  CATH 06/07/16 Diagnostic Diagram        Patient Profile     76 y.o. 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  Assessment & Plan    1. CAD:  Appreciate Dr. Dennie Maizes prompt evaluation. For CABG in AM. 2. Carotid stenosis s/p L CEA 05/29/2016: no recent CVA and healing well 3. HTN; good control 4. HLP: intolerant to highly active statins, now on low dose pravastatin,  which will not suffice. Will probably need assistance to get PCSK9 inhibitor.  Signed, Thurmon Fair, MD  06/08/2016, 12:57 PM

## 2016-06-08 NOTE — Progress Notes (Signed)
Case Management Note  Patient Details  Name: Sheryl Carter MRN: [id number] Date of Birth: 07/05/1940  Subjective/Objective:   From home, had recent CEA 3/5. At home having episode of slow speech, feet numbness and ? Fluttering on right upper and lower ext, CT shows normal findings. She is for MRI today, Trops elevated, pt/ot ordered.  NCM will cont to follow for dc needs.                  Action/Plan:   Expected Discharge Date:                         Expected Discharge Plan:  Home/Self Care   In-House Referral:     Discharge planning Services  CM Referral   Post Acute Care Choice:    Choice offered to:     DME Arranged:    DME Agency:     HH Arranged:    HH Agency:     Status of Service: In process, will continue to follow    If discussed at Long Length of Stay Meetings, dates discussed:    Additional Comments:  06/08/16 1555 PT/OT recommending no outpatient follow up.  Pt resides at home prior to admission; pt back at baseline, per PT/OT evaluations.  Sidney AceJulie Idella Lamontagne, RN, BSN  Case Manager   Original Note by: Leone Havenaylor, Deborah Clinton, RN 06/05/2016, 11:11 AM

## 2016-06-08 NOTE — Progress Notes (Signed)
  Echocardiogram 2D Echocardiogram has been performed.  Sheryl SavoyCasey N Derionna Carter 06/08/2016, 12:24 PM

## 2016-06-08 NOTE — Progress Notes (Signed)
CARDIAC REHAB PHASE I   Cardiac surgery pre-op education completed with pt. Reviewed IS, sternal precautions, activity progression, cardiac surgery booklet and cardiac surgery guidelines. Pt verbalized understanding. Pt declined to view cardiac surgery videos. Pt in recliner, call bell within reach. Will follow post-op.   [id number] Joylene GrapesEmily C Faithlynn Deeley, RN, BSN 06/08/2016 3:07 PM

## 2016-06-08 NOTE — Progress Notes (Signed)
[address]       [place] [id number]             [phone]                 1 Day Post-Op Procedure(s) (LRB): Left Heart Cath and Coronary Angiography (N/A)  LOS: 1 day   Subjective: No chest pain this am,   Objective: Vital signs in last 24 hours: Patient Vitals for the past 24 hrs:  BP Temp Temp src Pulse Resp SpO2  06/08/16 0758 (!) 143/66 98.3 F (36.8 C) Oral 67 20 96 %  06/08/16 0258 119/63 98.2 F (36.8 C) Oral (!) 56 18 95 %  06/07/16 2319 127/76 98.1 F (36.7 C) Oral (!) 58 19 94 %  06/07/16 2157 132/72 - - 65 - -  06/07/16 1930 132/70 98.1 F (36.7 C) Oral 72 18 96 %  06/07/16 1731 (!) 143/68 98.3 F (36.8 C) Oral 64 20 96 %  06/07/16 1710 (!) 113/57 - - 67 - 95 %  06/07/16 1700 (!) 169/69 - - 61 - 96 %  06/07/16 1655 (!) 145/56 - - 64 - 94 %  06/07/16 1650 (!) 160/57 - - 64 - 94 %  06/07/16 1645 (!) 157/60 - - 64 - 94 %  06/07/16 1640 (!) 148/60 - - 67 - 95 %  06/07/16 1635 134/88 - - (!) 59 - 98 %  06/07/16 1630 (!) 142/62 - - 60 (!) 7 96 %  06/07/16 1625 (!) 152/55 - - 62 15 96 %  06/07/16 1620 (!) 151/60 - - 61 (!) 23 95 %  06/07/16 1615 (!) 151/67 - - 63 (!) 21 97 %  06/07/16 1610 (!) 149/68 - - 62 (!) 22 96 %  06/07/16 1605 133/67 - - 61 17 96 %  06/07/16 1600 (!) 146/59 - - 61 17 93 %  06/07/16 1555 (!) 151/60 - - (!) 58 16 93 %  06/07/16 1550 (!) 154/61 - - 67 16 94 %  06/07/16 1545 (!) 152/70 - - 61 17 95 %  06/07/16 1540 (!) 158/57 - - 62 18 94 %  06/07/16 1535 (!) 144/65 - - (!) 58 16 97 %  06/07/16 1530 (!) 157/59 - - 63 18 96 %  06/07/16 1525 (!) 160/54 - - (!) 58 16 100 %  06/07/16 1520 (!) 161/60 - - 65 17 93 %  06/07/16 1515 (!) 162/57 - - 64 16 96 %  06/07/16 1510 (!) 152/61 - - 61 17 95 %  06/07/16 1505 (!) 154/61 - - 62 (!) 0 97 %  06/07/16 1500 (!) 158/61 - - 69 (!) 0 97 %  06/07/16 1459 (!) 158/61 - - 67 - -  06/07/16 1443 (!) 135/91 - - 67 16 -  06/07/16 1438 139/70 - - 68 17 100 %  06/07/16 1433 139/70  - - 64 14 99 %  06/07/16 1428 127/75 - - 66 16 99 %  06/07/16 1423 131/65 - - (!) 59 12 100 %  06/07/16 1419 (!) 142/77 - - (!) 59 17 100 %  06/07/16 1414 (!) 153/79 - - 65 15 99 %  06/07/16 1409 (!) 147/82 - - 64 16 100 %  06/07/16 1404 (!) 141/71 - - 63 18 100 %  06/07/16 1358 (!) 146/72 - - 61 (!) 82 98 %  06/07/16 1357 - - - 64 - -  06/07/16 1251 126/80 97.4 F (  36.3 C) Oral (!) 58 16 99 %    Filed Weights   06/04/16 1824 06/05/16 0125 06/06/16 1957  Weight: 183 lb (83 kg) 183 lb 6.8 oz (83.2 kg) 186 lb 8.2 oz (84.6 kg)    Hemodynamic parameters for last 24 hours:    Intake/Output from previous day: 03/14 0701 - 03/15 0700 In: 1306.3 [P.O.:340; I.V.:966.3] Out: 700 [Urine:700] Intake/Output this shift: Total I/O In: -  Out: 100 [Urine:100]  Scheduled Meds: . ALPRAZolam  0.125 mg Oral BID  . aspirin EC  325 mg Oral Daily  . enoxaparin (LOVENOX) injection  40 mg Subcutaneous Q24H  . metoprolol tartrate  25 mg Oral BID  . pantoprazole  40 mg Oral Daily  . sodium chloride flush  3 mL Intravenous Q12H  . timolol  1 drop Right Eye BID   Continuous Infusions: . sodium chloride 10 mL/hr at 06/07/16 2152   PRN Meds:.sodium chloride, oxyCODONE-acetaminophen, senna-docusate, sodium chloride flush, witch hazel-glycerin  General appearance: alert and cooperative Neurologic: intact Heart: regular rate and rhythm, S1, S2 normal, no murmur, click, rub or gallop Lungs: clear to auscultation bilaterally Abdomen: soft, non-tender; bowel sounds normal; no masses,  no organomegaly Extremities: extremities normal, atraumatic, no cyanosis or edema and Homans sign is negative, no sign of DVT Wound: left neck wound well healed   Lab Results: CBC: Recent Labs  06/07/16 0922 06/08/16 0258  WBC 6.2 6.4  HGB 11.7* 11.8*  HCT 36.1 35.1*  PLT 278 267   BMET:  Recent Labs  06/07/16 0922 06/08/16 0258  NA 141 139  K 3.8 3.5  CL 108 108  CO2 26 24  GLUCOSE 95 91  BUN 6 7    CREATININE 0.81 0.83  CALCIUM 9.0 8.8*    PT/INR:  Recent Labs  06/07/16 0922  LABPROT 13.4  INR 1.02     Assessment/Plan: S/P Procedure(s) (LRB): Left Heart Cath and Coronary Angiography (N/A)   Patient agreeable with proceeding with CABG , risks and options have been discussed with her and her daughter in detail.  The goals risks and alternatives of the planned surgical procedure CABG have been discussed with the patient in detail. The risks of the procedure including death, infection, stroke, myocardial infarction, bleeding, blood transfusion have all been discussed specifically.  I have quoted Sheryl Carter a 4 % of perioperative mortality and a complication rate as high as 40 %. The patient's questions have been answered.Sheryl Carter is willing  to proceed with the planned procedure.   Delight Ovens MD 06/08/2016 10:37 AM     Patient ID: Sheryl Carter, female   DOB: [birth year], 76 y.o.   MRN: [id number]

## 2016-06-08 NOTE — Anesthesia Preprocedure Evaluation (Addendum)
Anesthesia Evaluation  Patient identified by MRN, date of birth, ID band Patient awake    Reviewed: Allergy & Precautions, NPO status , Patient's Chart, lab work & pertinent test results  History of Anesthesia Complications Negative for: history of anesthetic complications  Airway Mallampati: II  TM Distance: >3 FB Neck ROM: Full    Dental  (+) Edentulous Upper, Edentulous Lower   Pulmonary neg pulmonary ROS,    breath sounds clear to auscultation       Cardiovascular hypertension, Pt. on medications (-) angina+ CAD (80% LM), + Past MI, + Peripheral Vascular Disease (s/p recent CEA) and + DVT   Rhythm:Regular Rate:Normal  ECHO:  EF 50-55%, inferior akinesis, inferoseptal hypokinesis   Neuro/Psych Anxiety Recent CEA, recent TIA TIA   GI/Hepatic Neg liver ROS, GERD  Controlled,  Endo/Other  Morbid obesity  Renal/GU negative Renal ROS     Musculoskeletal   Abdominal (+) + obese,   Peds  Hematology negative hematology ROS (+)   Anesthesia Other Findings   Reproductive/Obstetrics                            Anesthesia Physical Anesthesia Plan  ASA: III  Anesthesia Plan: General   Post-op Pain Management:    Induction: Intravenous  Airway Management Planned: Oral ETT  Additional Equipment: Arterial line, PA Cath, TEE and Ultrasound Guidance Line Placement  Intra-op Plan:   Post-operative Plan: Post-operative intubation/ventilation  Informed Consent: I have reviewed the patients History and Physical, chart, labs and discussed the procedure including the risks, benefits and alternatives for the proposed anesthesia with the patient or authorized representative who has indicated his/her understanding and acceptance.     Plan Discussed with: CRNA and Surgeon  Anesthesia Plan Comments: (Plan routine monitors, A line, PA cath, GETA with TEE and post op intubation)         Anesthesia Quick Evaluation

## 2016-06-08 NOTE — Progress Notes (Signed)
[facility] Progress Note  Patient: Sheryl ChanceShirley M Rimel WUJ:[id number]   PCP: Cassell SmilesFUSCO,LAWRENCE J., MD DOB: 08/28/1940   DOA: 06/04/2016   DOS: 06/08/2016   Date of Service: the patient was seen and examined on 06/08/2016  Subjective: feeling better, no acute chest pain or shortnes of breath. Anxiety is well controlled  Brief hospital course: Pt. with PMH of CEA 03/18, HTN, HLD, glaucoma with left eye blindness; admitted on 06/04/2016, with complaint of aphasia and headache, was found to have complicated migraine. Also has NSTEMI and cardiac cath shows severe TVD, cardiothoracic surgery planning CABG Friday. Currently further plan is monitor.  Assessment and Plan: 1. Aphasia  Complicated migraine vs anxiety -MRI negative for Acute CVA -symptoms resolved -appreciate Neuro input, EEG negative for epileptiform actvity, continue aspirin on discharge  2. NSTEMI - peak of 3.3, trended down to 2.5, EKG with Q waves in inf leads - no chest pain, pt thinks she had a panic attack 3/11 afternoon - Cards consulted, 2D ECHO with EF of 50-55% and inferior hypokinesis and Q waves in lead 3 - continue ASA/statin, metoprolol - Underwent cardiac catheterization and was found to have severe triple vessel disease as well as left main disease, cardiovascular thoracic surgery consulted. Planning for cardiac bypass on Friday. Repeating Echocardiogram since earlier study was poor to determine any valvular disease. We will continue to monitor in the step down unit at present. Based on the discussion with thoracic surgery CODE STATUS has been changed to full code.  3. recent left carotid endarterectomy on 05/29/2016 -continue ASA and statin, MRI without acute findings, carotid duplex with 1-39% stenosis  4. Glaucoma -continue eye drops  5. Anxiety -continue xanax PRN  Bowel regimen: last BM 06/05/2016 Diet: cardiac diet DVT Prophylaxis: subcutaneous Heparin  Advance goals of care discussion: full  code  Family Communication: no family was present at bedside, at the time of interview.   Disposition:  Expected discharge date: to be determined based on post-op course  Consultants: cardiology, neurology, cardiothoracic surgery Procedures: Echocardiogram, cardiac cath  Antibiotics: Anti-infectives    None       Objective: Physical Exam: Vitals:   06/07/16 1930 06/07/16 2157 06/07/16 2319 06/08/16 0258  BP: 132/70 132/72 127/76 119/63  Pulse: 72 65 (!) 58 (!) 56  Resp: 18  19 18   Temp: 98.1 F (36.7 C)  98.1 F (36.7 C) 98.2 F (36.8 C)  TempSrc: Oral  Oral Oral  SpO2: 96%  94% 95%  Weight:      Height:        Intake/Output Summary (Last 24 hours) at 06/08/16 0704 Last data filed at 06/08/16 0553  Gross per 24 hour  Intake          1306.25 ml  Output              700 ml  Net           606.25 ml   Filed Weights   06/04/16 1824 06/05/16 0125 06/06/16 1957  Weight: 83 kg (183 lb) 83.2 kg (183 lb 6.8 oz) 84.6 kg (186 lb 8.2 oz)   General: Alert, Awake and Oriented to Time, Place and Person. Appear in mild distress, affect appropriate ENT: Oral Mucosa clear moist Neck: difficult to assess JVD, no Abnormal Mass Or lumps Cardiovascular: S1 and S2 Present, no Murmur, Respiratory: Bilateral Air entry equal and Decreased, no use of accessory muscle, Clear to Auscultation, no Crackles, no wheezes Abdomen: Bowel Sound present, Soft and no tenderness Skin:  on redness, no Rash, no induration Extremities: trace Pedal edema, no calf tenderness Neurologic: Grossly no focal neuro deficit. Bilaterally Equal motor strength  Data Reviewed: CBC:  Recent Labs Lab 06/04/16 1834 06/04/16 1840 06/05/16 0222 06/07/16 0922 06/08/16 0258  WBC 7.2  --  5.9 6.2 6.4  NEUTROABS 3.6  --   --  2.7  --   HGB 12.7 12.2 11.8* 11.7* 11.8*  HCT 36.8 36.0 35.6* 36.1 35.1*  MCV 91.5  --  91.5 93.3 93.4  PLT 290  --  251 278 267   Basic Metabolic Panel:  Recent Labs Lab 06/04/16 1834  06/04/16 1840 06/05/16 0222 06/07/16 0922 06/08/16 0258  NA 137 138  --  141 139  K 3.7 3.7  --  3.8 3.5  CL 100* 99*  --  108 108  CO2 30  --   --  26 24  GLUCOSE 114* 114*  --  95 91  BUN 8 5*  --  6 7  CREATININE 0.87 0.90 0.81 0.81 0.83  CALCIUM 9.5  --   --  9.0 8.8*    Liver Function Tests:  Recent Labs Lab 06/04/16 1834 06/07/16 0922 06/08/16 0258  AST 28 23 20   ALT 17 14 13*  ALKPHOS 65 56 51  BILITOT 0.4 0.5 0.7  PROT 6.8 5.6* 5.3*  ALBUMIN 3.4* 3.0* 2.8*   No results for input(s): LIPASE, AMYLASE in the last 168 hours. No results for input(s): AMMONIA in the last 168 hours. Coagulation Profile:  Recent Labs Lab 06/04/16 1834 06/07/16 0922  INR 0.94 1.02   Cardiac Enzymes:  Recent Labs Lab 06/05/16 0222 06/05/16 0806 06/05/16 1534  TROPONINI 3.12* 2.55* 2.19*   BNP (last 3 results) No results for input(s): PROBNP in the last 8760 hours. CBG:  Recent Labs Lab 06/04/16 1829 06/05/16 1850  GLUCAP 116* 113*   Studies: No results found.  Scheduled Meds: . ALPRAZolam  0.125 mg Oral BID  . aspirin EC  325 mg Oral Daily  . enoxaparin (LOVENOX) injection  40 mg Subcutaneous Q24H  . metoprolol tartrate  25 mg Oral BID  . pantoprazole  40 mg Oral Daily  . sodium chloride flush  3 mL Intravenous Q12H  . timolol  1 drop Right Eye BID   Continuous Infusions: . sodium chloride 10 mL/hr at 06/07/16 2152   PRN Meds: sodium chloride, oxyCODONE-acetaminophen, senna-docusate, sodium chloride flush, witch hazel-glycerin  Time spent: 30 minutes  Author: Lynden Oxford, MD [facility] Pager: [phone] 06/08/2016 7:04 AM  If 7PM-7AM, please contact night-coverage at [email or web address], password [facility]

## 2016-06-09 ENCOUNTER — Inpatient Hospital Stay (HOSPITAL_COMMUNITY): Payer: Medicare HMO | Admitting: Certified Registered Nurse Anesthetist

## 2016-06-09 ENCOUNTER — Inpatient Hospital Stay (HOSPITAL_COMMUNITY): Payer: Medicare HMO

## 2016-06-09 ENCOUNTER — Inpatient Hospital Stay (HOSPITAL_COMMUNITY)
Admission: EM | Disposition: A | Discharge status: Skilled Nursing Facility | Payer: Self-pay | Source: Home / Self Care | Attending: Cardiothoracic Surgery

## 2016-06-09 HISTORY — PX: CORONARY ARTERY BYPASS GRAFT: SHX141

## 2016-06-09 HISTORY — PX: TEE WITHOUT CARDIOVERSION: SHX5443

## 2016-06-09 LAB — CREATININE, SERUM
Creatinine, Ser: 0.65 mg/dL (ref 0.44–1.00)
GFR calc Af Amer: >60 mL/min (ref >60)
GFR calc non Af Amer: >60 mL/min (ref >60)

## 2016-06-09 LAB — POCT I-STAT 3, ART BLOOD GAS (G3+)
ACID-BASE DEFICIT: 6 mmol/L — AB (ref 0.0–2.0)
ACID-BASE DEFICIT: 2 mmol/L (ref 0.0–2.0)
ACID-BASE DEFICIT: 3 mmol/L — AB (ref 0.0–2.0)
ACID-BASE EXCESS: 3 mmol/L — AB (ref 0.0–2.0)
Acid-Base Excess: 5 mmol/L — ABNORMAL HIGH (ref 0.0–2.0)
BICARBONATE: 28.9 mmol/L — AB (ref 20.0–28.0)
BICARBONATE: 20.8 mmol/L (ref 20.0–28.0)
BICARBONATE: 22.9 mmol/L (ref 20.0–28.0)
Bicarbonate: 26.5 mmol/L (ref 20.0–28.0)
Bicarbonate: 23.7 mmol/L (ref 20.0–28.0)
O2 SAT: 100 %
O2 SAT: 100 %
O2 SAT: 97 %
O2 SAT: 94 %
O2 Saturation: 94 %
PCO2 ART: 40.6 mmHg (ref 32.0–48.0)
PCO2 ART: 44.5 mmHg (ref 32.0–48.0)
PH ART: 7.254 — AB (ref 7.350–7.450)
PH ART: 7.333 — AB (ref 7.350–7.450)
PO2 ART: 410 mmHg — AB (ref 83.0–108.0)
PO2 ART: 95 mmHg (ref 83.0–108.0)
PO2 ART: 76 mmHg — AB (ref 83.0–108.0)
Patient temperature: 36.6
TCO2: 30 mmol/L (ref 0–100)
TCO2: 28 mmol/L (ref 0–100)
TCO2: 22 mmol/L (ref 0–100)
TCO2: 25 mmol/L (ref 0–100)
TCO2: 24 mmol/L (ref 0–100)
pCO2 arterial: 36.2 mmHg (ref 32.0–48.0)
pCO2 arterial: 46.6 mmHg (ref 32.0–48.0)
pCO2 arterial: 44.4 mmHg (ref 32.0–48.0)
pH, Arterial: 7.461 — ABNORMAL HIGH (ref 7.350–7.450)
pH, Arterial: 7.473 — ABNORMAL HIGH (ref 7.350–7.450)
pH, Arterial: 7.32 — ABNORMAL LOW (ref 7.350–7.450)
pO2, Arterial: 253 mmHg — ABNORMAL HIGH (ref 83.0–108.0)
pO2, Arterial: 80 mmHg — ABNORMAL LOW (ref 83.0–108.0)

## 2016-06-09 LAB — CBC
HCT: 38.5 % (ref 36.0–46.0)
HCT: 31.7 % — ABNORMAL LOW (ref 36.0–46.0)
HCT: 28.4 % — ABNORMAL LOW (ref 36.0–46.0)
Hemoglobin: 12.8 g/dL (ref 12.0–15.0)
Hemoglobin: 10.7 g/dL — ABNORMAL LOW (ref 12.0–15.0)
Hemoglobin: 9.6 g/dL — ABNORMAL LOW (ref 12.0–15.0)
MCH: 31.1 pg (ref 26.0–34.0)
MCH: 30.6 pg (ref 26.0–34.0)
MCH: 30.4 pg (ref 26.0–34.0)
MCHC: 33.2 g/dL (ref 30.0–36.0)
MCHC: 33.8 g/dL (ref 30.0–36.0)
MCHC: 33.8 g/dL (ref 30.0–36.0)
MCV: 93.7 fL (ref 78.0–100.0)
MCV: 90.6 fL (ref 78.0–100.0)
MCV: 89.9 fL (ref 78.0–100.0)
PLATELETS: 178 10*3/uL (ref 150–400)
Platelets: 300 10*3/uL (ref 150–400)
Platelets: 187 10*3/uL (ref 150–400)
RBC: 4.11 MIL/uL (ref 3.87–5.11)
RBC: 3.5 MIL/uL — ABNORMAL LOW (ref 3.87–5.11)
RBC: 3.16 MIL/uL — ABNORMAL LOW (ref 3.87–5.11)
RDW: 13 % (ref 11.5–15.5)
RDW: 14 % (ref 11.5–15.5)
RDW: 13.9 % (ref 11.5–15.5)
WBC: 6.5 10*3/uL (ref 4.0–10.5)
WBC: 15.2 10*3/uL — AB (ref 4.0–10.5)
WBC: 21 10*3/uL — ABNORMAL HIGH (ref 4.0–10.5)

## 2016-06-09 LAB — BLOOD GAS, ARTERIAL
Acid-Base Excess: 1.1 mmol/L (ref 0.0–2.0)
Bicarbonate: 25.3 mmol/L (ref 20.0–28.0)
Drawn by: 270271
O2 Saturation: 94.5 %
Patient temperature: 98.6
pCO2 arterial: 40.5 mmHg (ref 32.0–48.0)
pH, Arterial: 7.411 (ref 7.350–7.450)
pO2, Arterial: 74.2 mmHg — ABNORMAL LOW (ref 83.0–108.0)

## 2016-06-09 LAB — POCT I-STAT, CHEM 8
BUN: 5 mg/dL — ABNORMAL LOW (ref 6–20)
BUN: 4 mg/dL — AB (ref 6–20)
BUN: 4 mg/dL — AB (ref 6–20)
BUN: 4 mg/dL — AB (ref 6–20)
BUN: 4 mg/dL — AB (ref 6–20)
BUN: 4 mg/dL — ABNORMAL LOW (ref 6–20)
BUN: 4 mg/dL — AB (ref 6–20)
CALCIUM ION: 1.02 mmol/L — AB (ref 1.15–1.40)
CALCIUM ION: 1.01 mmol/L — AB (ref 1.15–1.40)
CHLORIDE: 105 mmol/L (ref 101–111)
CHLORIDE: 101 mmol/L (ref 101–111)
CHLORIDE: 102 mmol/L (ref 101–111)
CREATININE: 0.4 mg/dL — AB (ref 0.44–1.00)
CREATININE: 0.4 mg/dL — AB (ref 0.44–1.00)
CREATININE: 0.7 mg/dL (ref 0.44–1.00)
Calcium, Ion: 1.28 mmol/L (ref 1.15–1.40)
Calcium, Ion: 0.96 mmol/L — ABNORMAL LOW (ref 1.15–1.40)
Calcium, Ion: 1.01 mmol/L — ABNORMAL LOW (ref 1.15–1.40)
Calcium, Ion: 1.01 mmol/L — ABNORMAL LOW (ref 1.15–1.40)
Calcium, Ion: 1.18 mmol/L (ref 1.15–1.40)
Chloride: 103 mmol/L (ref 101–111)
Chloride: 103 mmol/L (ref 101–111)
Chloride: 106 mmol/L (ref 101–111)
Chloride: 107 mmol/L (ref 101–111)
Creatinine, Ser: 0.6 mg/dL (ref 0.44–1.00)
Creatinine, Ser: 0.3 mg/dL — ABNORMAL LOW (ref 0.44–1.00)
Creatinine, Ser: 0.3 mg/dL — ABNORMAL LOW (ref 0.44–1.00)
Creatinine, Ser: 0.5 mg/dL (ref 0.44–1.00)
GLUCOSE: 125 mg/dL — AB (ref 65–99)
GLUCOSE: 114 mg/dL — AB (ref 65–99)
Glucose, Bld: 104 mg/dL — ABNORMAL HIGH (ref 65–99)
Glucose, Bld: 86 mg/dL (ref 65–99)
Glucose, Bld: 120 mg/dL — ABNORMAL HIGH (ref 65–99)
Glucose, Bld: 121 mg/dL — ABNORMAL HIGH (ref 65–99)
Glucose, Bld: 144 mg/dL — ABNORMAL HIGH (ref 65–99)
HCT: 28 % — ABNORMAL LOW (ref 36.0–46.0)
HCT: 21 % — ABNORMAL LOW (ref 36.0–46.0)
HCT: 21 % — ABNORMAL LOW (ref 36.0–46.0)
HCT: 24 % — ABNORMAL LOW (ref 36.0–46.0)
HEMATOCRIT: 19 % — AB (ref 36.0–46.0)
HEMATOCRIT: 22 % — AB (ref 36.0–46.0)
HEMATOCRIT: 25 % — AB (ref 36.0–46.0)
HEMOGLOBIN: 7.1 g/dL — AB (ref 12.0–15.0)
HEMOGLOBIN: 8.2 g/dL — AB (ref 12.0–15.0)
HEMOGLOBIN: 8.5 g/dL — AB (ref 12.0–15.0)
Hemoglobin: 9.5 g/dL — ABNORMAL LOW (ref 12.0–15.0)
Hemoglobin: 6.5 g/dL — CL (ref 12.0–15.0)
Hemoglobin: 7.1 g/dL — ABNORMAL LOW (ref 12.0–15.0)
Hemoglobin: 7.5 g/dL — ABNORMAL LOW (ref 12.0–15.0)
POTASSIUM: 3.4 mmol/L — AB (ref 3.5–5.1)
POTASSIUM: 3.7 mmol/L (ref 3.5–5.1)
POTASSIUM: 3.9 mmol/L (ref 3.5–5.1)
Potassium: 4.2 mmol/L (ref 3.5–5.1)
Potassium: 3.4 mmol/L — ABNORMAL LOW (ref 3.5–5.1)
Potassium: 3.5 mmol/L (ref 3.5–5.1)
Potassium: 4.1 mmol/L (ref 3.5–5.1)
SODIUM: 142 mmol/L (ref 135–145)
SODIUM: 143 mmol/L (ref 135–145)
SODIUM: 139 mmol/L (ref 135–145)
SODIUM: 141 mmol/L (ref 135–145)
Sodium: 141 mmol/L (ref 135–145)
Sodium: 143 mmol/L (ref 135–145)
Sodium: 139 mmol/L (ref 135–145)
TCO2: 28 mmol/L (ref 0–100)
TCO2: 30 mmol/L (ref 0–100)
TCO2: 30 mmol/L (ref 0–100)
TCO2: 26 mmol/L (ref 0–100)
TCO2: 26 mmol/L (ref 0–100)
TCO2: 29 mmol/L (ref 0–100)
TCO2: 25 mmol/L (ref 0–100)

## 2016-06-09 LAB — GLUCOSE, CAPILLARY
Glucose-Capillary: 94 mg/dL (ref 65–99)
Glucose-Capillary: 118 mg/dL — ABNORMAL HIGH (ref 65–99)
Glucose-Capillary: 137 mg/dL — ABNORMAL HIGH (ref 65–99)
Glucose-Capillary: 132 mg/dL — ABNORMAL HIGH (ref 65–99)
Glucose-Capillary: 151 mg/dL — ABNORMAL HIGH (ref 65–99)
Glucose-Capillary: 139 mg/dL — ABNORMAL HIGH (ref 65–99)
Glucose-Capillary: 98 mg/dL (ref 65–99)

## 2016-06-09 LAB — APTT: aPTT: 31 seconds (ref 24–36)

## 2016-06-09 LAB — PROTIME-INR
INR: 1.51
Prothrombin Time: 18.3 seconds — ABNORMAL HIGH (ref 11.4–15.2)

## 2016-06-09 LAB — BASIC METABOLIC PANEL
Anion gap: 6 (ref 5–15)
BUN: 6 mg/dL (ref 6–20)
CO2: 28 mmol/L (ref 22–32)
Calcium: 9.3 mg/dL (ref 8.9–10.3)
Chloride: 107 mmol/L (ref 101–111)
Creatinine, Ser: 0.75 mg/dL (ref 0.44–1.00)
GFR calc Af Amer: >60 mL/min (ref >60)
GFR calc non Af Amer: >60 mL/min (ref >60)
Glucose, Bld: 103 mg/dL — ABNORMAL HIGH (ref 65–99)
Potassium: 3.4 mmol/L — ABNORMAL LOW (ref 3.5–5.1)
Sodium: 141 mmol/L (ref 135–145)

## 2016-06-09 LAB — HEMOGLOBIN AND HEMATOCRIT, BLOOD
HCT: 22.2 % — ABNORMAL LOW (ref 36.0–46.0)
Hemoglobin: 7.7 g/dL — ABNORMAL LOW (ref 12.0–15.0)

## 2016-06-09 LAB — POCT I-STAT 4, (NA,K, GLUC, HGB,HCT)
Glucose, Bld: 137 mg/dL — ABNORMAL HIGH (ref 65–99)
HEMATOCRIT: 30 % — AB (ref 36.0–46.0)
Hemoglobin: 10.2 g/dL — ABNORMAL LOW (ref 12.0–15.0)
Potassium: 3.6 mmol/L (ref 3.5–5.1)
Sodium: 142 mmol/L (ref 135–145)

## 2016-06-09 LAB — MRSA PCR SCREENING: MRSA by PCR: NEGATIVE

## 2016-06-09 LAB — PREPARE RBC (CROSSMATCH)

## 2016-06-09 LAB — MAGNESIUM: Magnesium: 2.3 mg/dL (ref 1.7–2.4)

## 2016-06-09 LAB — HEMOGLOBIN A1C
Hgb A1c MFr Bld: 5.4 % (ref 4.8–5.6)
Mean Plasma Glucose: 108 mg/dL

## 2016-06-09 LAB — PLATELET COUNT: Platelets: 153 10*3/uL (ref 150–400)

## 2016-06-09 SURGERY — CORONARY ARTERY BYPASS GRAFTING (CABG)
Anesthesia: General | Site: Chest

## 2016-06-09 MED ORDER — ASPIRIN EC 325 MG PO TBEC
325.0000 mg | DELAYED_RELEASE_TABLET | Freq: Every day | ORAL | Status: DC
  Administered 2016-06-10 – 2016-06-11 (×2): 325 mg via ORAL
  Filled 2016-06-09 (×2): qty 1

## 2016-06-09 MED ORDER — METOPROLOL TARTRATE 25 MG/10 ML ORAL SUSPENSION: 12.5000 mg | Freq: Two times a day (BID) | ORAL | Status: DC

## 2016-06-09 MED ORDER — MIDAZOLAM HCL 5 MG/5ML IJ SOLN
INTRAMUSCULAR | Status: DC | PRN
  Administered 2016-06-09 (×2): 2 mg via INTRAVENOUS
  Administered 2016-06-09: 1 mg via INTRAVENOUS
  Administered 2016-06-09 (×2): 2 mg via INTRAVENOUS
  Administered 2016-06-09: 1 mg via INTRAVENOUS

## 2016-06-09 MED ORDER — LACTATED RINGERS IV SOLN: 500.0000 mL | Freq: Once | INTRAVENOUS | Status: DC | PRN

## 2016-06-09 MED ORDER — SODIUM CHLORIDE 0.9 % IJ SOLN
OROMUCOSAL | Status: DC | PRN
  Administered 2016-06-09: 4 mL via TOPICAL

## 2016-06-09 MED ORDER — MIDAZOLAM HCL 10 MG/2ML IJ SOLN
INTRAMUSCULAR | Status: AC
  Filled 2016-06-09: qty 2

## 2016-06-09 MED ORDER — ENOXAPARIN SODIUM 30 MG/0.3ML ~~LOC~~ SOLN
30.0000 mg | SUBCUTANEOUS | Status: DC
  Administered 2016-06-10 – 2016-06-14 (×5): 30 mg via SUBCUTANEOUS
  Filled 2016-06-09 (×5): qty 0.3

## 2016-06-09 MED ORDER — SODIUM CHLORIDE 0.9 % IV SOLN
0.0000 ug/kg/h | INTRAVENOUS | Status: DC
  Filled 2016-06-09: qty 2

## 2016-06-09 MED ORDER — 0.9 % SODIUM CHLORIDE (POUR BTL) OPTIME
TOPICAL | Status: DC | PRN
  Administered 2016-06-09: 6000 mL

## 2016-06-09 MED ORDER — PROTAMINE SULFATE 10 MG/ML IV SOLN
INTRAVENOUS | Status: AC
  Filled 2016-06-09: qty 25

## 2016-06-09 MED ORDER — FENTANYL CITRATE (PF) 250 MCG/5ML IJ SOLN
INTRAMUSCULAR | Status: AC
Start: 2016-06-09 — End: 2016-06-09
  Filled 2016-06-09: qty 5

## 2016-06-09 MED ORDER — DEXTROSE 5 % IV SOLN
1.5000 g | Freq: Two times a day (BID) | INTRAVENOUS | Status: AC
  Administered 2016-06-09 – 2016-06-11 (×4): 1.5 g via INTRAVENOUS
  Filled 2016-06-09 (×4): qty 1.5

## 2016-06-09 MED ORDER — ASPIRIN 81 MG PO CHEW: 324.0000 mg | CHEWABLE_TABLET | Freq: Every day | ORAL | Status: DC

## 2016-06-09 MED ORDER — SODIUM CHLORIDE 0.9% FLUSH
3.0000 mL | Freq: Two times a day (BID) | INTRAVENOUS | Status: DC
  Administered 2016-06-10 – 2016-06-11 (×4): 3 mL via INTRAVENOUS

## 2016-06-09 MED ORDER — NITROGLYCERIN IN D5W 200-5 MCG/ML-% IV SOLN: 0.0000 ug/min | INTRAVENOUS | Status: DC

## 2016-06-09 MED ORDER — DOPAMINE-DEXTROSE 3.2-5 MG/ML-% IV SOLN
2.0000 ug/kg/min | INTRAVENOUS | Status: DC
  Filled 2016-06-09: qty 250

## 2016-06-09 MED ORDER — CHLORHEXIDINE GLUCONATE 0.12 % MT SOLN
15.0000 mL | OROMUCOSAL | Status: AC
  Administered 2016-06-09: 15 mL via OROMUCOSAL

## 2016-06-09 MED ORDER — ALBUMIN HUMAN 5 % IV SOLN
INTRAVENOUS | Status: DC | PRN
  Administered 2016-06-09: 13:00:00 via INTRAVENOUS

## 2016-06-09 MED ORDER — FENTANYL CITRATE (PF) 250 MCG/5ML IJ SOLN
INTRAMUSCULAR | Status: AC
  Filled 2016-06-09: qty 25

## 2016-06-09 MED ORDER — LACTATED RINGERS IV SOLN: INTRAVENOUS | Status: DC

## 2016-06-09 MED ORDER — ACETAMINOPHEN 160 MG/5ML PO SOLN: 650.0000 mg | Freq: Once | ORAL | Status: AC

## 2016-06-09 MED ORDER — SODIUM CHLORIDE 0.9 % IV SOLN: 250.0000 mL | INTRAVENOUS | Status: DC

## 2016-06-09 MED ORDER — MORPHINE SULFATE (PF) 2 MG/ML IV SOLN: 1.0000 mg | INTRAVENOUS | Status: AC | PRN

## 2016-06-09 MED ORDER — OXYCODONE HCL 5 MG PO TABS
5.0000 mg | ORAL_TABLET | ORAL | Status: DC | PRN
  Administered 2016-06-10 (×2): 5 mg via ORAL
  Filled 2016-06-09 (×3): qty 1

## 2016-06-09 MED ORDER — MORPHINE SULFATE (PF) 2 MG/ML IV SOLN
2.0000 mg | INTRAVENOUS | Status: DC | PRN
  Administered 2016-06-09 – 2016-06-10 (×2): 2 mg via INTRAVENOUS
  Administered 2016-06-10: 4 mg via INTRAVENOUS
  Filled 2016-06-09 (×2): qty 1
  Filled 2016-06-09: qty 2
  Filled 2016-06-09: qty 1

## 2016-06-09 MED ORDER — ACETAMINOPHEN 160 MG/5ML PO SOLN
1000.0000 mg | Freq: Four times a day (QID) | ORAL | Status: DC
  Administered 2016-06-10: 1000 mg

## 2016-06-09 MED ORDER — ROCURONIUM BROMIDE 50 MG/5ML IV SOSY
PREFILLED_SYRINGE | INTRAVENOUS | Status: AC
Start: 2016-06-09 — End: 2016-06-09
  Filled 2016-06-09: qty 5

## 2016-06-09 MED ORDER — SODIUM CHLORIDE 0.9 % IV SOLN
0.0000 ug/min | INTRAVENOUS | Status: DC
  Administered 2016-06-09: 40 ug/min via INTRAVENOUS
  Filled 2016-06-09 (×2): qty 2

## 2016-06-09 MED ORDER — TRAMADOL HCL 50 MG PO TABS
50.0000 mg | ORAL_TABLET | ORAL | Status: DC | PRN
  Administered 2016-06-12: 100 mg via ORAL
  Filled 2016-06-09: qty 2
  Filled 2016-06-09: qty 1

## 2016-06-09 MED ORDER — INSULIN REGULAR BOLUS VIA INFUSION
0.0000 [IU] | Freq: Three times a day (TID) | INTRAVENOUS | Status: DC
  Filled 2016-06-09: qty 10

## 2016-06-09 MED ORDER — CHLORHEXIDINE GLUCONATE 0.12% ORAL RINSE (MEDLINE KIT)
15.0000 mL | Freq: Two times a day (BID) | OROMUCOSAL | Status: DC
Start: 2016-06-09 — End: 2016-06-11
  Administered 2016-06-09 – 2016-06-10 (×2): 15 mL via OROMUCOSAL

## 2016-06-09 MED ORDER — SODIUM CHLORIDE 0.9% FLUSH: 3.0000 mL | INTRAVENOUS | Status: DC | PRN

## 2016-06-09 MED ORDER — BISACODYL 10 MG RE SUPP: 10.0000 mg | Freq: Every day | RECTAL | Status: DC

## 2016-06-09 MED ORDER — MAGNESIUM SULFATE 4 GM/100ML IV SOLN
4.0000 g | Freq: Once | INTRAVENOUS | Status: AC
  Administered 2016-06-09: 4 g via INTRAVENOUS
  Filled 2016-06-09: qty 100

## 2016-06-09 MED ORDER — METOPROLOL TARTRATE 5 MG/5ML IV SOLN: 2.5000 mg | INTRAVENOUS | Status: DC | PRN

## 2016-06-09 MED ORDER — LACTATED RINGERS IV SOLN
INTRAVENOUS | Status: DC | PRN
  Administered 2016-06-09: 08:00:00 via INTRAVENOUS

## 2016-06-09 MED ORDER — LACTATED RINGERS IV SOLN
INTRAVENOUS | Status: DC | PRN
  Administered 2016-06-09: 07:00:00 via INTRAVENOUS

## 2016-06-09 MED ORDER — METOPROLOL TARTRATE 12.5 MG HALF TABLET
12.5000 mg | ORAL_TABLET | Freq: Two times a day (BID) | ORAL | Status: DC
  Administered 2016-06-10 – 2016-06-11 (×4): 12.5 mg via ORAL
  Filled 2016-06-09 (×4): qty 1

## 2016-06-09 MED ORDER — MIDAZOLAM HCL 2 MG/2ML IJ SOLN: 2.0000 mg | INTRAMUSCULAR | Status: DC | PRN

## 2016-06-09 MED ORDER — FAMOTIDINE IN NACL 20-0.9 MG/50ML-% IV SOLN
20.0000 mg | Freq: Two times a day (BID) | INTRAVENOUS | Status: AC
  Administered 2016-06-09 – 2016-06-10 (×2): 20 mg via INTRAVENOUS
  Filled 2016-06-09: qty 50

## 2016-06-09 MED ORDER — FENTANYL CITRATE (PF) 250 MCG/5ML IJ SOLN
INTRAMUSCULAR | Status: DC | PRN
  Administered 2016-06-09 (×2): 100 ug via INTRAVENOUS
  Administered 2016-06-09: 150 ug via INTRAVENOUS
  Administered 2016-06-09 (×3): 100 ug via INTRAVENOUS
  Administered 2016-06-09: 450 ug via INTRAVENOUS
  Administered 2016-06-09: 150 ug via INTRAVENOUS
  Administered 2016-06-09 (×2): 50 ug via INTRAVENOUS
  Administered 2016-06-09: 150 ug via INTRAVENOUS

## 2016-06-09 MED ORDER — SODIUM CHLORIDE 0.9 % IV SOLN
30.0000 meq | Freq: Once | INTRAVENOUS | Status: AC
  Administered 2016-06-09: 30 meq via INTRAVENOUS
  Filled 2016-06-09: qty 15

## 2016-06-09 MED ORDER — PROPOFOL 10 MG/ML IV BOLUS
INTRAVENOUS | Status: DC | PRN
  Administered 2016-06-09: 20 mg via INTRAVENOUS

## 2016-06-09 MED ORDER — BISACODYL 5 MG PO TBEC
10.0000 mg | DELAYED_RELEASE_TABLET | Freq: Every day | ORAL | Status: DC
  Administered 2016-06-10 – 2016-06-11 (×2): 10 mg via ORAL
  Filled 2016-06-09 (×2): qty 2

## 2016-06-09 MED ORDER — ROCURONIUM BROMIDE 10 MG/ML (PF) SYRINGE
PREFILLED_SYRINGE | INTRAVENOUS | Status: DC | PRN
  Administered 2016-06-09 (×3): 50 mg via INTRAVENOUS

## 2016-06-09 MED ORDER — ROCURONIUM BROMIDE 50 MG/5ML IV SOSY
PREFILLED_SYRINGE | INTRAVENOUS | Status: AC
  Filled 2016-06-09: qty 10

## 2016-06-09 MED ORDER — VANCOMYCIN HCL IN DEXTROSE 1-5 GM/200ML-% IV SOLN
1000.0000 mg | Freq: Once | INTRAVENOUS | Status: AC
  Administered 2016-06-09: 1000 mg via INTRAVENOUS
  Filled 2016-06-09: qty 200

## 2016-06-09 MED ORDER — SODIUM CHLORIDE 0.9 % IV SOLN
INTRAVENOUS | Status: DC | PRN
  Administered 2016-06-09: 13:00:00 via INTRAVENOUS

## 2016-06-09 MED ORDER — ACETAMINOPHEN 650 MG RE SUPP
650.0000 mg | Freq: Once | RECTAL | Status: AC
  Administered 2016-06-09: 650 mg via RECTAL

## 2016-06-09 MED ORDER — ONDANSETRON HCL 4 MG/2ML IJ SOLN
4.0000 mg | Freq: Four times a day (QID) | INTRAMUSCULAR | Status: DC | PRN
  Administered 2016-06-09 – 2016-06-11 (×4): 4 mg via INTRAVENOUS
  Filled 2016-06-09 (×4): qty 2

## 2016-06-09 MED ORDER — ALBUMIN HUMAN 5 % IV SOLN
250.0000 mL | INTRAVENOUS | Status: AC | PRN
  Administered 2016-06-09 – 2016-06-10 (×4): 250 mL via INTRAVENOUS
  Filled 2016-06-09 (×2): qty 250

## 2016-06-09 MED ORDER — ORAL CARE MOUTH RINSE
15.0000 mL | Freq: Four times a day (QID) | OROMUCOSAL | Status: DC
  Administered 2016-06-10 (×4): 15 mL via OROMUCOSAL

## 2016-06-09 MED ORDER — HEPARIN SODIUM (PORCINE) 1000 UNIT/ML IJ SOLN
INTRAMUSCULAR | Status: DC | PRN
  Administered 2016-06-09: 30000 [IU] via INTRAVENOUS

## 2016-06-09 MED ORDER — ACETAMINOPHEN 500 MG PO TABS
1000.0000 mg | ORAL_TABLET | Freq: Four times a day (QID) | ORAL | Status: DC
  Administered 2016-06-10 – 2016-06-11 (×4): 1000 mg via ORAL
  Administered 2016-06-12: 500 mg via ORAL
  Filled 2016-06-09 (×6): qty 2

## 2016-06-09 MED ORDER — SODIUM CHLORIDE 0.9 % IV SOLN: INTRAVENOUS | Status: DC

## 2016-06-09 MED ORDER — SODIUM CHLORIDE 0.45 % IV SOLN: INTRAVENOUS | Status: DC | PRN

## 2016-06-09 MED ORDER — SODIUM CHLORIDE 0.9 % IV SOLN
INTRAVENOUS | Status: DC
  Administered 2016-06-09: 1.4 [IU]/h via INTRAVENOUS
  Filled 2016-06-09 (×2): qty 2.5

## 2016-06-09 MED ORDER — ARTIFICIAL TEARS OP OINT
TOPICAL_OINTMENT | OPHTHALMIC | Status: DC | PRN
  Administered 2016-06-09: 1 via OPHTHALMIC

## 2016-06-09 MED ORDER — HEMOSTATIC AGENTS (NO CHARGE) OPTIME
TOPICAL | Status: DC | PRN
  Administered 2016-06-09 (×2): 1 via TOPICAL

## 2016-06-09 MED ORDER — PANTOPRAZOLE SODIUM 40 MG PO TBEC
40.0000 mg | DELAYED_RELEASE_TABLET | Freq: Every day | ORAL | Status: DC
  Administered 2016-06-11: 40 mg via ORAL
  Filled 2016-06-09: qty 1

## 2016-06-09 MED ORDER — PROTAMINE SULFATE 10 MG/ML IV SOLN
INTRAVENOUS | Status: DC | PRN
  Administered 2016-06-09: 300 mg via INTRAVENOUS

## 2016-06-09 MED ORDER — PROPOFOL 10 MG/ML IV BOLUS
INTRAVENOUS | Status: AC
  Filled 2016-06-09: qty 20

## 2016-06-09 MED ORDER — DOCUSATE SODIUM 100 MG PO CAPS
200.0000 mg | ORAL_CAPSULE | Freq: Every day | ORAL | Status: DC
  Administered 2016-06-10 – 2016-06-11 (×2): 200 mg via ORAL
  Filled 2016-06-09 (×2): qty 2

## 2016-06-09 MED FILL — Heparin Sodium (Porcine) Inj 1000 Unit/ML: INTRAMUSCULAR | Qty: 10 | Status: AC

## 2016-06-09 MED FILL — Heparin Sodium (Porcine) Inj 1000 Unit/ML: INTRAMUSCULAR | Qty: 30 | Status: AC

## 2016-06-09 MED FILL — Mannitol IV Soln 20%: INTRAVENOUS | Qty: 500 | Status: AC

## 2016-06-09 MED FILL — Potassium Chloride Inj 2 mEq/ML: INTRAVENOUS | Qty: 40 | Status: AC

## 2016-06-09 MED FILL — Sodium Bicarbonate IV Soln 8.4%: INTRAVENOUS | Qty: 50 | Status: AC

## 2016-06-09 MED FILL — Electrolyte-R (PH 7.4) Solution: INTRAVENOUS | Qty: 4000 | Status: AC

## 2016-06-09 MED FILL — Lidocaine HCl IV Inj 20 MG/ML: INTRAVENOUS | Qty: 5 | Status: AC

## 2016-06-09 MED FILL — Magnesium Sulfate Inj 50%: INTRAMUSCULAR | Qty: 10 | Status: AC

## 2016-06-09 MED FILL — Sodium Chloride IV Soln 0.9%: INTRAVENOUS | Qty: 2000 | Status: AC

## 2016-06-09 SURGICAL SUPPLY — 84 items
ADH SKN CLS APL DERMABOND .7 (GAUZE/BANDAGES/DRESSINGS) ×4
BAG DECANTER FOR FLEXI CONT (MISCELLANEOUS) ×3 IMPLANT
BANDAGE ACE 4X5 VEL STRL LF (GAUZE/BANDAGES/DRESSINGS) ×4 IMPLANT
BANDAGE ACE 6X5 VEL STRL LF (GAUZE/BANDAGES/DRESSINGS) ×4 IMPLANT
BLADE STERNUM SYSTEM 6 (BLADE) ×3 IMPLANT
BLADE SURG 11 STRL SS (BLADE) ×1 IMPLANT
BNDG GAUZE ELAST 4 BULKY (GAUZE/BANDAGES/DRESSINGS) ×4 IMPLANT
CANISTER SUCT 3000ML PPV (MISCELLANEOUS) ×3 IMPLANT
CANNULA VEN 2 STAGE (MISCELLANEOUS) ×1 IMPLANT
CANNULA VESSEL 3MM 2 BLNT TIP (CANNULA) ×1 IMPLANT
CATH CPB KIT GERHARDT (MISCELLANEOUS) ×3 IMPLANT
CATH THORACIC 28FR (CATHETERS) ×3 IMPLANT
CLIP FOGARTY SPRING 6M (CLIP) ×1 IMPLANT
CRADLE DONUT ADULT HEAD (MISCELLANEOUS) ×3 IMPLANT
DERMABOND ADVANCED (GAUZE/BANDAGES/DRESSINGS) ×2
DERMABOND ADVANCED .7 DNX12 (GAUZE/BANDAGES/DRESSINGS) IMPLANT
DRAIN CHANNEL 28F RND 3/8 FF (WOUND CARE) ×3 IMPLANT
DRAPE CARDIOVASCULAR INCISE (DRAPES) ×3
DRAPE SLUSH/WARMER DISC (DRAPES) ×3 IMPLANT
DRAPE SRG 135X102X78XABS (DRAPES) ×2 IMPLANT
DRSG AQUACEL AG ADV 3.5X14 (GAUZE/BANDAGES/DRESSINGS) ×3 IMPLANT
ELECT BLADE 4.0 EZ CLEAN MEGAD (MISCELLANEOUS) ×3
ELECT REM PT RETURN 9FT ADLT (ELECTROSURGICAL) ×6
ELECTRODE BLDE 4.0 EZ CLN MEGD (MISCELLANEOUS) ×2 IMPLANT
ELECTRODE REM PT RTRN 9FT ADLT (ELECTROSURGICAL) ×4 IMPLANT
FELT TEFLON 1X6 (MISCELLANEOUS) ×6 IMPLANT
GAUZE SPONGE 4X4 12PLY STRL (GAUZE/BANDAGES/DRESSINGS) ×5 IMPLANT
GAUZE SPONGE 4X4 12PLY STRL LF (GAUZE/BANDAGES/DRESSINGS) ×2 IMPLANT
GLOVE BIO SURGEON STRL SZ 6.5 (GLOVE) ×13 IMPLANT
GLOVE BIO SURGEON STRL SZ7 (GLOVE) ×6 IMPLANT
GLOVE BIOGEL PI IND STRL 6 (GLOVE) IMPLANT
GLOVE BIOGEL PI IND STRL 6.5 (GLOVE) IMPLANT
GLOVE BIOGEL PI IND STRL 7.0 (GLOVE) IMPLANT
GLOVE BIOGEL PI INDICATOR 6 (GLOVE) ×3
GLOVE BIOGEL PI INDICATOR 6.5 (GLOVE) ×5
GLOVE BIOGEL PI INDICATOR 7.0 (GLOVE) ×4
GOWN STRL REUS W/ TWL LRG LVL3 (GOWN DISPOSABLE) ×8 IMPLANT
GOWN STRL REUS W/TWL LRG LVL3 (GOWN DISPOSABLE) ×33
HEMOSTAT POWDER SURGIFOAM 1G (HEMOSTASIS) ×9 IMPLANT
HEMOSTAT SURGICEL 2X14 (HEMOSTASIS) ×3 IMPLANT
KIT BASIN OR (CUSTOM PROCEDURE TRAY) ×3 IMPLANT
KIT CATH SUCT 8FR (CATHETERS) ×3 IMPLANT
KIT ROOM TURNOVER OR (KITS) ×3 IMPLANT
KIT SUCTION CATH 14FR (SUCTIONS) ×6 IMPLANT
KIT VASOVIEW HEMOPRO VH 3000 (KITS) ×3 IMPLANT
LEAD PACING MYOCARDI (MISCELLANEOUS) ×4 IMPLANT
MARKER GRAFT CORONARY BYPASS (MISCELLANEOUS) ×9 IMPLANT
NS IRRIG 1000ML POUR BTL (IV SOLUTION) ×16 IMPLANT
PACK OPEN HEART (CUSTOM PROCEDURE TRAY) ×3 IMPLANT
PAD ARMBOARD 7.5X6 YLW CONV (MISCELLANEOUS) ×6 IMPLANT
PAD ELECT DEFIB RADIOL ZOLL (MISCELLANEOUS) ×3 IMPLANT
PENCIL BUTTON HOLSTER BLD 10FT (ELECTRODE) ×3 IMPLANT
PUNCH AORTIC ROT 4.0MM RCL 40 (MISCELLANEOUS) ×1 IMPLANT
SET CARDIOPLEGIA MPS 5001102 (MISCELLANEOUS) ×1 IMPLANT
SPONGE LAP 18X18 X RAY DECT (DISPOSABLE) ×5 IMPLANT
SURGIFLO W/THROMBIN 8M KIT (HEMOSTASIS) ×1 IMPLANT
SUT BONE WAX W31G (SUTURE) ×3 IMPLANT
SUT MNCRL AB 4-0 PS2 18 (SUTURE) ×1 IMPLANT
SUT PROLENE 3 0 SH1 36 (SUTURE) ×5 IMPLANT
SUT PROLENE 4 0 TF (SUTURE) ×6 IMPLANT
SUT PROLENE 6 0 CC (SUTURE) ×10 IMPLANT
SUT PROLENE 7 0 BV1 MDA (SUTURE) ×7 IMPLANT
SUT PROLENE 8 0 BV175 6 (SUTURE) ×4 IMPLANT
SUT SILK 2 0 SH CR/8 (SUTURE) ×1 IMPLANT
SUT STEEL 6MS V (SUTURE) ×3 IMPLANT
SUT STEEL SZ 6 DBL 3X14 BALL (SUTURE) ×3 IMPLANT
SUT VIC AB 1 CTX 18 (SUTURE) ×6 IMPLANT
SUT VIC AB 2-0 CT1 27 (SUTURE) ×6
SUT VIC AB 2-0 CT1 TAPERPNT 27 (SUTURE) IMPLANT
SUT VIC AB 2-0 CTX 27 (SUTURE) ×2 IMPLANT
SUTURE E-PAK OPEN HEART (SUTURE) ×3 IMPLANT
SYSTEM SAHARA CHEST DRAIN ATS (WOUND CARE) ×3 IMPLANT
TAPE CLOTH SURG 4X10 WHT LF (GAUZE/BANDAGES/DRESSINGS) ×1 IMPLANT
TAPE PAPER 2X10 WHT MICROPORE (GAUZE/BANDAGES/DRESSINGS) ×1 IMPLANT
TOWEL GREEN STERILE (TOWEL DISPOSABLE) ×12 IMPLANT
TOWEL GREEN STERILE FF (TOWEL DISPOSABLE) ×6 IMPLANT
TOWEL OR 17X24 6PK STRL BLUE (TOWEL DISPOSABLE) ×6 IMPLANT
TOWEL OR 17X26 10 PK STRL BLUE (TOWEL DISPOSABLE) ×6 IMPLANT
TRAY FOLEY IC TEMP SENS 16FR (CATHETERS) ×3 IMPLANT
TUBE CONNECTING 12X1/4 (SUCTIONS) ×1 IMPLANT
TUBING INSUFFLATION (TUBING) ×3 IMPLANT
UNDERPAD 30X30 (UNDERPADS AND DIAPERS) ×3 IMPLANT
WATER STERILE IRR 1000ML POUR (IV SOLUTION) ×6 IMPLANT
YANKAUER SUCT BULB TIP NO VENT (SUCTIONS) ×1 IMPLANT

## 2016-06-09 NOTE — Anesthesia Procedure Notes (Signed)
Procedure Name: Intubation Date/Time: 06/09/2016 7:48 AM Performed by: Candis Shine Pre-anesthesia Checklist: Patient identified, Emergency Drugs available, Suction available and Patient being monitored Patient Re-evaluated:Patient Re-evaluated prior to inductionOxygen Delivery Method: Circle System Utilized Preoxygenation: Pre-oxygenation with 100% oxygen Intubation Type: IV induction Ventilation: Mask ventilation without difficulty and Oral airway inserted - appropriate to patient size Laryngoscope Size: Mac and 3 Grade View: Grade I Tube type: Oral Tube size: 7.5 mm Number of attempts: 1 Airway Equipment and Method: Stylet and Oral airway Placement Confirmation: ETT inserted through vocal cords under direct vision,  positive ETCO2 and breath sounds checked- equal and bilateral Secured at: 22 cm Tube secured with: Tape Dental Injury: Teeth and Oropharynx as per pre-operative assessment

## 2016-06-09 NOTE — Progress Notes (Signed)
      [address]       [place] [id number]             [phone]     Pre Procedure note for inpatients:   Sheryl ChanceShirley M Carter has been scheduled for Procedure(s): CORONARY ARTERY BYPASS GRAFTING (CABG) (N/A) TRANSESOPHAGEAL ECHOCARDIOGRAM (TEE) (N/A) today. The various methods of treatment have been discussed with the patient. After consideration of the risks, benefits and treatment options the patient has consented to the planned procedure.   The patient has been seen and labs reviewed. There are no changes in the patient's condition to prevent proceeding with the planned procedure today.  Recent labs:  Lab Results  Component Value Date   WBC 6.5 06/09/2016   HGB 12.8 06/09/2016   HCT 38.5 06/09/2016   PLT 300 06/09/2016   GLUCOSE 103 (H) 06/09/2016   CHOL 216 (H) 06/05/2016   TRIG 128 06/05/2016   HDL 36 (L) 06/05/2016   LDLCALC 154 (H) 06/05/2016   ALT 13 (L) 06/08/2016   AST 20 06/08/2016   NA 141 06/09/2016   K 3.4 (L) 06/09/2016   CL 107 06/09/2016   CREATININE 0.75 06/09/2016   BUN 6 06/09/2016   CO2 28 06/09/2016   INR 1.02 06/07/2016   HGBA1C 5.4 06/08/2016    Sheryl OvensEdward B Karson Reede, MD 06/09/2016 7:07 AM

## 2016-06-09 NOTE — Anesthesia Procedure Notes (Signed)
Central Venous Catheter Insertion Performed by: Catalina Gravel, anesthesiologist Start/End3/16/2018 6:45 AM, 06/09/2016 7:00 AM Patient location: Pre-op. Preanesthetic checklist: patient identified, IV checked, site marked, risks and benefits discussed, surgical consent, monitors and equipment checked, pre-op evaluation, timeout performed and anesthesia consent Position: Trendelenburg Lidocaine 1% used for infiltration and patient sedated Hand hygiene performed , maximum sterile barriers used  and Seldinger technique used Catheter size: 9 Fr Central line was placed.MAC introducer Swan type:thermodilution Procedure performed using ultrasound guided technique. Ultrasound Notes:anatomy identified, needle tip was noted to be adjacent to the nerve/plexus identified, no ultrasound evidence of intravascular and/or intraneural injection and image(s) printed for medical record Attempts: 1 Following insertion, line sutured and dressing applied. Post procedure assessment: blood return through all ports, free fluid flow and no air  Patient tolerated the procedure well with no immediate complications.

## 2016-06-09 NOTE — Anesthesia Postprocedure Evaluation (Addendum)
Anesthesia Post Note  Patient: Loleta ChanceShirley M Delisa  Procedure(s) Performed: Procedure(s) (LRB): CORONARY ARTERY BYPASS GRAFTING (CABG) times 4 using the left internal mammary artery and bilateral thigh greater saphenous veins harvested endoscopically. LIMA to LAD, SVG to DIAGONAL 2, SVG to OM, SVG to RCA (N/A) TRANSESOPHAGEAL ECHOCARDIOGRAM (TEE) (N/A)  Patient location during evaluation: SICU Anesthesia Type: General Level of consciousness: sedated and patient remains intubated per anesthesia plan Pain management: pain level controlled Vital Signs Assessment: post-procedure vital signs reviewed and stable Respiratory status: patient remains intubated per anesthesia plan and patient on ventilator - see flowsheet for VS Cardiovascular status: blood pressure returned to baseline and stable Postop Assessment: no signs of nausea or vomiting Anesthetic complications: no       Last Vitals:  Vitals:   06/09/16 1500 06/09/16 1530  BP: 114/64   Pulse: 80 80  Resp: 16 15  Temp: 36.1 C 36.2 C    Last Pain:  Vitals:   06/09/16 0448  TempSrc: Oral  PainSc: 0-No pain                 Reniah Cottingham,E. Pamelyn Bancroft

## 2016-06-09 NOTE — Progress Notes (Signed)
  Echocardiogram 2D Echocardiogram has been performed.  Tye SavoyCasey N Zorian Gunderman 06/09/2016, 9:11 AM

## 2016-06-09 NOTE — Progress Notes (Signed)
Received verbal confirmation from Craige CottaKirby, NP that patient can go down  to OR off of telemetry monitor.

## 2016-06-09 NOTE — Transfer of Care (Signed)
Immediate Anesthesia Transfer of Care Note  Patient: Loleta ChanceShirley M Tom  Procedure(s) Performed: Procedure(s): CORONARY ARTERY BYPASS GRAFTING (CABG) times 4 using the left internal mammary artery and bilateral thigh greater saphenous veins harvested endoscopically. LIMA to LAD, SVG to DIAGONAL 2, SVG to OM, SVG to RCA (N/A) TRANSESOPHAGEAL ECHOCARDIOGRAM (TEE) (N/A)  Patient Location: PACU  Anesthesia Type:General  Level of Consciousness: sedated and Patient remains intubated per anesthesia plan  Airway & Oxygen Therapy: Patient remains intubated per anesthesia plan and Patient placed on Ventilator (see vital sign flow sheet for setting)  Post-op Assessment: Report given to RN and Post -op Vital signs reviewed and stable  Post vital signs: Reviewed and stable  Last Vitals:  Vitals:   06/09/16 0719 06/09/16 0720  BP:    Pulse: (!) 59 62  Resp:    Temp:      Last Pain:  Vitals:   06/09/16 0448  TempSrc: Oral  PainSc: 0-No pain         Complications: No apparent anesthesia complications

## 2016-06-09 NOTE — Procedures (Signed)
Extubation Procedure Note  Patient Details:   Name: Sheryl Carter DOB: 10/15/1940 MRN: [id number]   Airway Documentation:     Evaluation  O2 sats: stable throughout Complications: No apparent complications Patient did tolerate procedure well. Bilateral Breath Sounds: Clear, Diminished   Yes  Pt extubated to a 4lpm [place]. NIF-30 VC 460mls  [name], [name] 06/09/2016, 10:14 PM

## 2016-06-09 NOTE — Progress Notes (Signed)
      [address]       [place] [id number]             [phone]      Just starting to wake up  s/p CABG x 4  BP 105/64   Pulse 80   Temp 97 F (36.1 C)   Resp 18   Ht 5\' 2"  (1.575 m)   Wt 182 lb 8 oz (82.8 kg)   SpO2 96%   BMI 33.38 kg/m  CI= 2.0 on 2 mcg/kg/min of dopamine  Intake/Output Summary (Last 24 hours) at 06/09/16 1729 Last data filed at 06/09/16 1600  Gross per 24 hour  Intake          3763.78 ml  Output             2730 ml  Net          1033.78 ml   Hct= 32 K= 3.6 - being supplemented  Viviann SpareSteven C. Dorris FetchHendrickson, MD [facility] [phone]

## 2016-06-09 NOTE — Anesthesia Procedure Notes (Signed)
Central Venous Catheter Insertion Performed by: Cecile HearingURK, STEPHEN EDWARD, anesthesiologist Start/End3/16/2018 7:00 AM, 06/09/2016 7:05 AM Patient location: Pre-op. Preanesthetic checklist: patient identified, IV checked, site marked, risks and benefits discussed, surgical consent, monitors and equipment checked, pre-op evaluation, timeout performed and anesthesia consent Hand hygiene performed  and maximum sterile barriers used  Total catheter length 50. PA cath was placed.Swan type:thermodilution PA Cath depth:80 Procedure performed using ultrasound guided technique. Ultrasound Notes:anatomy identified, needle tip was noted to be adjacent to the nerve/plexus identified, no ultrasound evidence of intravascular and/or intraneural injection and image(s) printed for medical record Attempts: 1 Patient tolerated the procedure well with no immediate complications.

## 2016-06-09 NOTE — Progress Notes (Signed)
[facility] PROGRESS NOTE  Patient: Sheryl Carter WUJ:[id number]   PCP: Cassell SmilesFUSCO,LAWRENCE J., MD DOB: 06/14/1940   DOA: 06/04/2016   DOS: 06/09/2016    Patient remains surgery throughout the day and postop her care was transferred to cardiothoracic surgery. I do appreciate their input.  Author: Lynden OxfordPranav Jolin Benavides, MD [facility] Pager: [phone] 06/09/2016 4:23 PM   If 7PM-7AM, please contact night-coverage at [email or web address], password [email or web address]

## 2016-06-09 NOTE — Brief Op Note (Addendum)
      [address]       [place] [id number]             [phone]      06/09/2016  12:09 PM  PATIENT:  Sheryl GravelShirley M Villalona  76 y.o. female  PRE-OPERATIVE DIAGNOSIS:  1. S/p NSTEMI 2.Coronary artery disease  POST-OPERATIVE DIAGNOSIS:  1. S/p NSTEMI 2.Coronary artery disease  PROCEDURE: TRANSESOPHAGEAL ECHOCARDIOGRAM (TEE),MEDIAN STERNOTOMY for  CORONARY ARTERY BYPASS GRAFTING (CABG) x 4 (LIMA to LAD, SVG to DIAGONAL 2, SVG to OM, SVG to RCA)  using the left internal mammary artery and bilateral thigh greater saphenous veins harvested endoscopically.   SURGEON:  Surgeon(s) and Role:    * Delight OvensEdward B Kylan Veach, MD - Primary  PHYSICIAN ASSISTANT: Doree Fudgeonielle Zimmerman PA-C  ANESTHESIA:   general  EBL:  Total I/O In: 1800 [I.V.:1800] Out: 575 [Urine:575]  DRAINS: Chest tubes placed in the place mediastinal and pleural spaces   COUNTS CORRECT:  YES  DICTATION: .Dragon Dictation  PLAN OF CARE: Admit to inpatient   PATIENT DISPOSITION:  ICU - intubated and hemodynamically stable.   Delay start of Pharmacological VTE agent (>24hrs) due to surgical blood loss or risk of bleeding: yes  BASELINE WEIGHT: 83 kg

## 2016-06-10 ENCOUNTER — Inpatient Hospital Stay (HOSPITAL_COMMUNITY): Payer: Medicare HMO

## 2016-06-10 LAB — CBC
HCT: 25.9 % — ABNORMAL LOW (ref 36.0–46.0)
HCT: 25.8 % — ABNORMAL LOW (ref 36.0–46.0)
HEMOGLOBIN: 8.9 g/dL — AB (ref 12.0–15.0)
Hemoglobin: 8.8 g/dL — ABNORMAL LOW (ref 12.0–15.0)
MCH: 31 pg (ref 26.0–34.0)
MCH: 31.2 pg (ref 26.0–34.0)
MCHC: 34.4 g/dL (ref 30.0–36.0)
MCHC: 34.1 g/dL (ref 30.0–36.0)
MCV: 90.2 fL (ref 78.0–100.0)
MCV: 91.5 fL (ref 78.0–100.0)
PLATELETS: 165 10*3/uL (ref 150–400)
PLATELETS: 149 10*3/uL — AB (ref 150–400)
RBC: 2.87 MIL/uL — AB (ref 3.87–5.11)
RBC: 2.82 MIL/uL — AB (ref 3.87–5.11)
RDW: 14.2 % (ref 11.5–15.5)
RDW: 14.7 % (ref 11.5–15.5)
WBC: 20.4 10*3/uL — AB (ref 4.0–10.5)
WBC: 18.3 10*3/uL — AB (ref 4.0–10.5)

## 2016-06-10 LAB — GLUCOSE, CAPILLARY
GLUCOSE-CAPILLARY: 148 mg/dL — AB (ref 65–99)
GLUCOSE-CAPILLARY: 93 mg/dL (ref 65–99)
GLUCOSE-CAPILLARY: 99 mg/dL (ref 65–99)
Glucose-Capillary: 107 mg/dL — ABNORMAL HIGH (ref 65–99)
Glucose-Capillary: 116 mg/dL — ABNORMAL HIGH (ref 65–99)
Glucose-Capillary: 119 mg/dL — ABNORMAL HIGH (ref 65–99)

## 2016-06-10 LAB — BASIC METABOLIC PANEL
ANION GAP: 7 (ref 5–15)
BUN: 6 mg/dL (ref 6–20)
CO2: 23 mmol/L (ref 22–32)
Calcium: 7.8 mg/dL — ABNORMAL LOW (ref 8.9–10.3)
Chloride: 110 mmol/L (ref 101–111)
Creatinine, Ser: 0.78 mg/dL (ref 0.44–1.00)
GFR calc Af Amer: >60 mL/min (ref >60)
GLUCOSE: 127 mg/dL — AB (ref 65–99)
Potassium: 4 mmol/L (ref 3.5–5.1)
Sodium: 140 mmol/L (ref 135–145)

## 2016-06-10 LAB — CREATININE, SERUM
CREATININE: 1.03 mg/dL — AB (ref 0.44–1.00)
GFR calc Af Amer: 60 mL/min — ABNORMAL LOW (ref >60)
GFR calc non Af Amer: 51 mL/min — ABNORMAL LOW (ref >60)

## 2016-06-10 LAB — MAGNESIUM
MAGNESIUM: 2.1 mg/dL (ref 1.7–2.4)
Magnesium: 2.1 mg/dL (ref 1.7–2.4)

## 2016-06-10 MED ORDER — INSULIN ASPART 100 UNIT/ML ~~LOC~~ SOLN
0.0000 [IU] | SUBCUTANEOUS | Status: DC
  Administered 2016-06-10: 2 [IU] via SUBCUTANEOUS

## 2016-06-10 MED ORDER — ALPRAZOLAM 0.25 MG PO TABS
0.1250 mg | ORAL_TABLET | Freq: Three times a day (TID) | ORAL | Status: DC | PRN
  Administered 2016-06-10 – 2016-06-14 (×9): 0.125 mg via ORAL
  Filled 2016-06-10 (×10): qty 1

## 2016-06-10 NOTE — Plan of Care (Signed)
Problem: Activity: Goal: Risk for activity intolerance will decrease Outcome: Progressing Pt tolerated first dangle well.  Problem: Respiratory: Goal: Ability to tolerate decreased levels of ventilator support will improve Outcome: Completed/Met Date Met: 06/10/16 Pt extubated with appropriate ABGs and tolerating 4L [place] well.  Problem: Urinary Elimination: Goal: Ability to achieve and maintain adequate renal perfusion and functioning will improve Outcome: Progressing Pt making more than 30cc of urine per hour

## 2016-06-10 NOTE — Evaluation (Signed)
Physical Therapy Evaluation Patient Details Name: Sheryl Carter MRN: [id number] DOB: 01/11/1941 Today's Date: 06/10/2016   History of Present Illness  Pt is a 76 y.o. female who presented to [facility] with suspected TIA; recent left carotid endarterectomy on 05/29/2016. 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 Elevated troponin upon admission determined to be NSTEMI. 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 Pertinent PMH includes TIA, HTN, glaucoma, DVT, anxiety, L-eye blindness.  Clinical Impression  Pt presents to PT with increased pain, generalized weakness, impaired attention, and an overall decrease in functional mobility secondary to above. PTA, pt mod indep with SPC living at home alone; daughter and son live nearby and help with transportation as pt does not drive. Today, pt able to take a few steps to chair with minA while using BUE to hold pillow; further mobility limited by emesis. Educ on sternal precautions. Pt would benefit from continued acute PT services to maximize functional independence and mobility, with d/c to SNF secondary to mobility and cognitive impairments if pt will not have 24-7 assist available.     Follow Up Recommendations SNF    Equipment Recommendations  None recommended by PT    Recommendations for Other Services OT consult     Precautions / Restrictions Precautions Precautions: Sternal Restrictions Weight Bearing Restrictions: No      Mobility  Bed Mobility Overal bed mobility: Needs Assistance Bed Mobility: Supine to Sit     Supine to sit: HOB elevated;Min assist     General bed mobility comments: MinA for trunk support; verbal cues for sternal precautions.   Transfers Overall transfer level: Needs assistance Equipment used: None Transfers: Sit to/from Stand Sit to Stand: Min assist         General transfer comment: MinA to stand while pt using BUE to hold heart pillow.    Ambulation/Gait Ambulation/Gait assistance: Min assist Ambulation Distance (Feet): 2 Feet Assistive device: None Gait Pattern/deviations: Step-to pattern     General Gait Details: Slowed steps to bedside chair with minA for balance, pt able to use BUE to hold pillow. Required verbal cues secondary to vision impairments.   Stairs            Wheelchair Mobility    Modified Rankin (Stroke Patients Only)       Balance Overall balance assessment: Needs assistance Sitting-balance support: Feet supported;No upper extremity supported Sitting balance-Leahy Scale: Fair     Standing balance support: No upper extremity supported;During functional activity Standing balance-Leahy Scale: Fair                               Pertinent Vitals/Pain Pain Assessment: Faces Faces Pain Scale: Hurts even more Pain Location: Chest Pain Descriptors / Indicators: Aching Pain Intervention(s): Monitored during session;Repositioned;Limited activity within patient's tolerance    Home Living Family/patient expects to be discharged to:: Private residence Living Arrangements: Alone Available Help at Discharge: Available PRN/intermittently;Family (son and daughter) Type of Home: Mobile home Home Access: Ramped entrance     Home Layout: One level Home Equipment: Cane - single point;Walker - standard      Prior Function Level of Independence: Needs assistance   Gait / Transfers Assistance Needed: Mod indep with cane. Does not drive.  ADL's / Homemaking Assistance Needed: Daughter and son help with transportation. Pt indep with ADLs.   Comments: History of vertigo at baseline.      Hand  Dominance        Extremity/Trunk Assessment   Upper Extremity Assessment Upper Extremity Assessment: Generalized weakness    Lower Extremity Assessment Lower Extremity Assessment: Generalized weakness       Communication   Communication: No difficulties  Cognition  Arousal/Alertness: Awake/alert Behavior During Therapy: WFL for tasks assessed/performed Overall Cognitive Status: No family/caregiver present to determine baseline cognitive functioning Area of Impairment: Orientation;Attention;Memory;Problem solving;Awareness Orientation Level: Disoriented to;Time (Day) Current Attention Level: Selective Memory: Decreased short-term memory;Decreased recall of precautions     Awareness: Emergent Problem Solving: Requires verbal cues General Comments: Pt with increased distraction during mobility, most likely secondary to pain, medications, and nausea.     General Comments General comments (skin integrity, edema, etc.): SpO2 down to 85% on RA; remained >95% on 4L      Exercises     Assessment/Plan    PT Assessment Patient needs continued PT services  PT Problem List Decreased strength;Decreased mobility;Decreased coordination;Decreased activity tolerance;Decreased range of motion;Decreased balance;Decreased cognition;Pain       PT Treatment Interventions Gait training;Stair training;DME instruction;Therapeutic activities;Therapeutic exercise;Patient/family education;Balance training;Functional mobility training    PT Goals (Current goals can be found in the Care Plan section)  Acute Rehab PT Goals Patient Stated Goal: Return home PT Goal Formulation: With patient Time For Goal Achievement: 06/24/16 Potential to Achieve Goals: Good    Frequency Min 3X/week   Barriers to discharge Decreased caregiver support      Co-evaluation               End of Session Equipment Utilized During Treatment: Gait belt Activity Tolerance: Patient tolerated treatment well Patient left: in chair;with call bell/phone within reach Nurse Communication: Mobility status PT Visit Diagnosis: Difficulty in walking, not elsewhere classified (R26.2);Muscle weakness (generalized) (M62.81)         Time: 1610-9604 PT Time Calculation (min) (ACUTE ONLY):  24 min   Charges:   PT Evaluation $PT Eval Moderate Complexity: 1 Procedure PT Treatments $Therapeutic Activity: 8-22 mins   PT G Codes:       Sheryl Carter, SPT Office-[phone]  Sheryl Carter 06/10/2016, 1:04 PM

## 2016-06-10 NOTE — Plan of Care (Signed)
Problem: Activity: Goal: Risk for activity intolerance will decrease Outcome: Progressing Pt was able to sit in chair for multiple hours as well as ambulate 25 feet in the hallway  Problem: Bowel/Gastric: Goal: Gastrointestinal status for postoperative course will improve Outcome: Progressing Pt is passing gas, but has yet to have a bowel movement.

## 2016-06-10 NOTE — Progress Notes (Addendum)
1 Day Post-Op Procedure(s) (LRB): CORONARY ARTERY BYPASS GRAFTING (CABG) times 4 using the left internal mammary artery and bilateral thigh greater saphenous veins harvested endoscopically. LIMA to LAD, SVG to DIAGONAL 2, SVG to OM, SVG to RCA (N/A) TRANSESOPHAGEAL ECHOCARDIOGRAM (TEE) (N/A) Subjective: Anxious, asking for xanax c/o dry mouth and incisional pain  Objective: Vital signs in last 24 hours: Temp:  [96.6 F (35.9 C)-98.6 F (37 C)] 98.4 F (36.9 C) (03/17 0600) Pulse Rate:  [69-92] 92 (03/17 0600) Cardiac Rhythm: Atrial paced (03/16 2000) Resp:  [10-19] 18 (03/17 0600) BP: (86-115)/(57-81) 100/63 (03/17 0600) SpO2:  [84 %-100 %] 84 % (03/17 0600) Arterial Line BP: (62-136)/(37-61) 133/61 (03/17 0600) FiO2 (%):  [40 %-50 %] 40 % (03/16 2132) Weight:  [197 lb 1.5 oz (89.4 kg)] 197 lb 1.5 oz (89.4 kg) (03/17 0500)  Hemodynamic parameters for last 24 hours: PAP: (28-48)/(12-27) 48/27 CO:  [2.8 L/min-4.2 L/min] 3.1 L/min CI:  [1.7 L/min/m2-2.5 L/min/m2] 1.9 L/min/m2  Intake/Output from previous day: 03/16 0701 - 03/17 0700 In: 5367.7 [I.V.:3479.7; Blood:273; IV Piggyback:1615] Out: 3225 [Urine:2050; Emesis/NG output:250; Blood:650; Chest Tube:275] Intake/Output this shift: No intake/output data recorded.  General appearance: alert and no distress Neurologic: intact Heart: regular rate and rhythm Lungs: diminished breath sounds bibasilar Abdomen: normal findings: soft, non-tender  Lab Results:  Recent Labs  06/09/16 2030 06/10/16 0347  WBC 21.0* 20.4*  HGB 9.6*  8.5* 8.9*  HCT 28.4*  25.0* 25.9*  PLT 187 165   BMET:  Recent Labs  06/09/16 0422  06/09/16 2030 06/10/16 0347  NA 141  < > 139 140  K 3.4*  < > 3.9 4.0  CL 107  < > 107 110  CO2 28  --   --  23  GLUCOSE 103*  < > 144* 127*  BUN 6  < > 4* 6  CREATININE 0.75  < > 0.65  0.70 0.78  CALCIUM 9.3  --   --  7.8*  < > = values in this interval not displayed.  PT/INR:  Recent Labs   06/09/16 1447  LABPROT 18.3*  INR 1.51   ABG    Component Value Date/Time   PHART 7.320 (L) 06/09/2016 2312   HCO3 22.9 06/09/2016 2312   TCO2 24 06/09/2016 2312   ACIDBASEDEF 3.0 (H) 06/09/2016 2312   O2SAT 94.0 06/09/2016 2312   CBG (last 3)   Recent Labs  06/09/16 2308 06/10/16 0007 06/10/16 0816  GLUCAP 98 99 107*    Assessment/Plan: S/P Procedure(s) (LRB): CORONARY ARTERY BYPASS GRAFTING (CABG) times 4 using the left internal mammary artery and bilateral thigh greater saphenous veins harvested endoscopically. LIMA to LAD, SVG to DIAGONAL 2, SVG to OM, SVG to RCA (N/A) TRANSESOPHAGEAL ECHOCARDIOGRAM (TEE) (N/A) -  CV- s/p CABG  Good hemodynamics- wean dopamine, dc swan and a line  ASA, beta blocker  Statin allergy  Dc chest tubes  RESP- IS for atelectasis  RENAL- creatinine and lytes OK  ENDO_ CBG OK- now on q4 CBG/ SSI  SCD + enoxaparin for DVT prophylaxis  mobilize   LOS: 3 days    Sheryl Carter 06/10/2016

## 2016-06-10 NOTE — Progress Notes (Signed)
      [address]       [place] [id number]             [phone]      Up in chair Feels better than she did this morning Mild confusion BP (!) 109/59   Pulse 69   Temp 98.2 F (36.8 C) (Oral)   Resp 15   Ht 5\' 2"  (1.575 m)   Wt 197 lb 1.5 oz (89.4 kg)   SpO2 100%   BMI 36.05 kg/m   Intake/Output Summary (Last 24 hours) at 06/10/16 1725 Last data filed at 06/10/16 1600  Gross per 24 hour  Intake          1620.57 ml  Output             1045 ml  Net           575.57 ml   PM labs pending  Viviann SpareSteven C. Dorris FetchHendrickson, MD [facility] [phone]) [phone]

## 2016-06-11 ENCOUNTER — Inpatient Hospital Stay (HOSPITAL_COMMUNITY): Payer: Medicare HMO

## 2016-06-11 LAB — BASIC METABOLIC PANEL
Anion gap: 6 (ref 5–15)
BUN: 12 mg/dL (ref 6–20)
CO2: 26 mmol/L (ref 22–32)
Calcium: 8.4 mg/dL — ABNORMAL LOW (ref 8.9–10.3)
Chloride: 106 mmol/L (ref 101–111)
Creatinine, Ser: 1.05 mg/dL — ABNORMAL HIGH (ref 0.44–1.00)
GFR calc Af Amer: 58 mL/min — ABNORMAL LOW (ref >60)
GFR, EST NON AFRICAN AMERICAN: 50 mL/min — AB (ref >60)
GLUCOSE: 110 mg/dL — AB (ref 65–99)
POTASSIUM: 4 mmol/L (ref 3.5–5.1)
Sodium: 138 mmol/L (ref 135–145)

## 2016-06-11 LAB — GLUCOSE, CAPILLARY
Glucose-Capillary: 105 mg/dL — ABNORMAL HIGH (ref 65–99)
Glucose-Capillary: 109 mg/dL — ABNORMAL HIGH (ref 65–99)
Glucose-Capillary: 112 mg/dL — ABNORMAL HIGH (ref 65–99)
Glucose-Capillary: 110 mg/dL — ABNORMAL HIGH (ref 65–99)

## 2016-06-11 LAB — CBC
HEMATOCRIT: 24.6 % — AB (ref 36.0–46.0)
Hemoglobin: 8.3 g/dL — ABNORMAL LOW (ref 12.0–15.0)
MCH: 31.1 pg (ref 26.0–34.0)
MCHC: 33.7 g/dL (ref 30.0–36.0)
MCV: 92.1 fL (ref 78.0–100.0)
Platelets: 135 10*3/uL — ABNORMAL LOW (ref 150–400)
RBC: 2.67 MIL/uL — ABNORMAL LOW (ref 3.87–5.11)
RDW: 14.7 % (ref 11.5–15.5)
WBC: 14.8 10*3/uL — ABNORMAL HIGH (ref 4.0–10.5)

## 2016-06-11 MED ORDER — FUROSEMIDE 10 MG/ML IJ SOLN
40.0000 mg | Freq: Once | INTRAMUSCULAR | Status: AC
  Administered 2016-06-11: 40 mg via INTRAVENOUS
  Filled 2016-06-11: qty 4

## 2016-06-11 MED ORDER — INSULIN ASPART 100 UNIT/ML ~~LOC~~ SOLN: 0.0000 [IU] | Freq: Three times a day (TID) | SUBCUTANEOUS | Status: DC

## 2016-06-11 MED ORDER — METOCLOPRAMIDE HCL 5 MG/ML IJ SOLN
10.0000 mg | Freq: Four times a day (QID) | INTRAMUSCULAR | Status: AC
  Administered 2016-06-11 – 2016-06-12 (×3): 10 mg via INTRAVENOUS
  Filled 2016-06-11 (×3): qty 2

## 2016-06-11 MED ORDER — MECLIZINE HCL 12.5 MG PO TABS
12.5000 mg | ORAL_TABLET | Freq: Two times a day (BID) | ORAL | Status: DC | PRN
  Administered 2016-06-11: 12.5 mg via ORAL
  Filled 2016-06-11: qty 1

## 2016-06-11 NOTE — Plan of Care (Signed)
Problem: Safety: Goal: Ability to remain free from injury will improve Outcome: Progressing Continued ambulation

## 2016-06-11 NOTE — Progress Notes (Signed)
2 Days Post-Op Procedure(s) (LRB): CORONARY ARTERY BYPASS GRAFTING (CABG) times 4 using the left internal mammary artery and bilateral thigh greater saphenous veins harvested endoscopically. LIMA to LAD, SVG to DIAGONAL 2, SVG to OM, SVG to RCA (N/A) TRANSESOPHAGEAL ECHOCARDIOGRAM (TEE) (N/A) Subjective: c/o feeling dizziness when she got up to chair Denies pain and nausea  Objective: Vital signs in last 24 hours: Temp:  [97.3 F (36.3 C)-98.5 F (36.9 C)] 98.4 F (36.9 C) (03/18 0816) Pulse Rate:  [63-87] 77 (03/18 0700) Cardiac Rhythm: Normal sinus rhythm (03/17 2000) Resp:  [8-17] 15 (03/17 1200) BP: (80-134)/(32-80) 134/69 (03/18 0700) SpO2:  [91 %-100 %] 93 % (03/18 0700) Arterial Line BP: (99-121)/(50-51) 99/51 (03/17 1000) Weight:  [199 lb 4.7 oz (90.4 kg)] 199 lb 4.7 oz (90.4 kg) (03/18 0500)  Hemodynamic parameters for last 24 hours: PAP: (23-27)/(12-14) 23/14  Intake/Output from previous day: 03/17 0701 - 03/18 0700 In: 366.2 [I.V.:266.2; IV Piggyback:100] Out: 480 [Urine:395; Emesis/NG output:50; Chest Tube:35] Intake/Output this shift: No intake/output data recorded.  General appearance: alert, cooperative and no distress Neurologic: intact Heart: regular rate and rhythm Lungs: decreased BS with mild crackles bibasilar Abdomen: normal findings: soft, non-tender  Lab Results:  Recent Labs  06/10/16 1808 06/11/16 0318  WBC 18.3* 14.8*  HGB 8.8* 8.3*  HCT 25.8* 24.6*  PLT 149* 135*   BMET:  Recent Labs  06/10/16 0347 06/10/16 1808 06/11/16 0318  NA 140  --  138  K 4.0  --  4.0  CL 110  --  106  CO2 23  --  26  GLUCOSE 127*  --  110*  BUN 6  --  12  CREATININE 0.78 1.03* 1.05*  CALCIUM 7.8*  --  8.4*    PT/INR:  Recent Labs  06/09/16 1447  LABPROT 18.3*  INR 1.51   ABG    Component Value Date/Time   PHART 7.320 (L) 06/09/2016 2312   HCO3 22.9 06/09/2016 2312   TCO2 24 06/09/2016 2312   ACIDBASEDEF 3.0 (H) 06/09/2016 2312   O2SAT  94.0 06/09/2016 2312   CBG (last 3)   Recent Labs  06/10/16 2346 06/11/16 0346 06/11/16 0813  GLUCAP 119* 105* 109*    Assessment/Plan: S/P Procedure(s) (LRB): CORONARY ARTERY BYPASS GRAFTING (CABG) times 4 using the left internal mammary artery and bilateral thigh greater saphenous veins harvested endoscopically. LIMA to LAD, SVG to DIAGONAL 2, SVG to OM, SVG to RCA (N/A) TRANSESOPHAGEAL ECHOCARDIOGRAM (TEE) (N/A) POD # 3 CABG  CV- stable in SR  ASA, beta blocker, resume ACE-I when BP allows  Statin intolerance  RESP_ continue IS  RENAL- creatinine and lytes OK  ENDO- CBG well controlled  Anemia- stable, follow  Dizziness- likely just usual early postop orthostasis, will use meclizine if it persists  ambulate   LOS: 4 days    [name] Allana Shrestha 06/11/2016

## 2016-06-11 NOTE — Progress Notes (Signed)
      [address]       [place] [id number]             [phone]      c/o belching  BP 116/86   Pulse 75   Temp 98.4 F (36.9 C) (Oral)   Resp 15   Ht 5\' 2"  (1.575 m)   Wt 199 lb 4.7 oz (90.4 kg)   SpO2 98%   BMI 36.45 kg/m    Intake/Output Summary (Last 24 hours) at 06/11/16 1655 Last data filed at 06/11/16 1200  Gross per 24 hour  Intake              310 ml  Output             1220 ml  Net             -910 ml   Will give reglan x 24 hours  Viviann SpareSteven C. Dorris FetchHendrickson, MD [facility] [phone]

## 2016-06-11 NOTE — [place] FL2 (Signed)
MEDICAID FL2 LEVEL OF CARE SCREENING TOOL     IDENTIFICATION  Patient Name: Sheryl Carter Birthdate: March 21, 1941 Sex: female Admission Date (Current Location): 06/04/2016  [facility] and [place] Number:  Producer, television/film/video and Address:  [facility] [place], [address], [place], [place] [address]      Provider Number: [id number]  Attending Physician Name and Address:  Delight Ovens, MD  Relative Name and Phone Number:       Current Level of Care: Hospital Recommended Level of Care: Skilled Nursing Facility Prior Approval Number:    Date Approved/Denied:   PASRR Number: [id number]  Discharge Plan: SNF    Current Diagnoses: Patient Active Problem List   Diagnosis Date Noted  . Coronary artery disease   . NSTEMI (non-ST elevated myocardial infarction) (HCC) 06/07/2016  . Non-ST elevation (NSTEMI) myocardial infarction (HCC)   . Paresthesia   . Difficulty with speech   . TIA (transient ischemic attack) 06/04/2016  . Elevated troponin 06/04/2016  . Symptomatic carotid artery stenosis, left 05/29/2016  . Slurred speech 04/18/2016  . Hypertension, essential 04/18/2016  . Hyperlipidemia 04/18/2016  . Chronic anxiety 04/18/2016    Orientation RESPIRATION BLADDER Height & Weight     Self, Situation, Place, Time  Normal Continent Weight: 199 lb 4.7 oz (90.4 kg) Height:  5\' 2"  (157.5 cm)  BEHAVIORAL SYMPTOMS/MOOD NEUROLOGICAL BOWEL NUTRITION STATUS      Continent  (Please see discharge summary)  AMBULATORY STATUS COMMUNICATION OF NEEDS Skin   Limited Assist Verbally Surgical wounds (Closed incision, chest, Silver hydrofiber dressing.  Closed incision right thigh, left leg, gauze dressing.  Closed incision left neck, Liquid skin adhesive dressing.)                       Personal Care Assistance Level of Assistance  Bathing, Feeding, Dressing Bathing Assistance: Limited assistance Feeding assistance: Independent Dressing  Assistance: Limited assistance     Functional Limitations Info  Sight, Hearing, Speech Sight Info: Adequate Hearing Info: Adequate Speech Info: Adequate    SPECIAL CARE FACTORS FREQUENCY  PT (By licensed PT), OT (By licensed OT)     PT Frequency: min 3x week OT Frequency: min 3x week            Contractures Contractures Info: Not present    Additional Factors Info  Code Status, Allergies Code Status Info: Full Code Allergies Info: Other, Prednisone, Statins, Erythromycin           Current Medications (06/11/2016):  This is the current hospital active medication list Current Facility-Administered Medications  Medication Dose Route Frequency Provider Last Rate Last Dose  . 0.45 % sodium chloride infusion   Intravenous Continuous PRN Donielle Margaretann Loveless, PA-C      . 0.9 %  sodium chloride infusion  250 mL Intravenous Continuous Donielle Margaretann Loveless, PA-C      . 0.9 %  sodium chloride infusion   Intravenous Continuous Donielle Margaretann Loveless, PA-C      . acetaminophen (TYLENOL) tablet 1,000 mg  1,000 mg Oral Q6H Donielle Margaretann Loveless, PA-C   1,000 mg at 06/11/16 0518   Or  . acetaminophen (TYLENOL) solution 1,000 mg  1,000 mg Per Tube Q6H Donielle Margaretann Loveless, PA-C   1,000 mg at 06/10/16 0600  . ALPRAZolam Prudy Feeler) tablet 0.125 mg  0.125 mg Oral TID PRN Loreli Slot, MD   0.125 mg at 06/11/16 0856  . aspirin EC tablet 325 mg  325 mg Oral Daily Ardelle BallsDonielle M Zimmerman, PA-C   325 mg at 06/11/16 [id number]   Or  . aspirin chewable tablet 324 mg  324 mg Per Tube Daily Donielle Margaretann LovelessM Zimmerman, PA-C      . bisacodyl (DULCOLAX) EC tablet 10 mg  10 mg Oral Daily Ardelle Ballsonielle M Zimmerman, PA-C   10 mg at 06/11/16 [id number]   Or  . bisacodyl (DULCOLAX) suppository 10 mg  10 mg Rectal Daily Donielle Margaretann LovelessM Zimmerman, PA-C      . dexmedetomidine (PRECEDEX) 200 mcg in sodium chloride 0.9 % 50 mL infusion  0-0.7 mcg/kg/hr Intravenous Continuous Ardelle Ballsonielle M Zimmerman, PA-C   Stopped at 06/09/16 1730  .  docusate sodium (COLACE) capsule 200 mg  200 mg Oral Daily Ardelle BallsDonielle M Zimmerman, PA-C   200 mg at 06/11/16 0856  . enoxaparin (LOVENOX) injection 30 mg  30 mg Subcutaneous Q24H Ardelle Ballsonielle M Zimmerman, PA-C   30 mg at 06/10/16 2031  . insulin aspart (novoLOG) injection 0-9 Units  0-9 Units Subcutaneous TID WC [name] Hendrickson, MD      . lactated ringers infusion 500 mL  500 mL Intravenous Once PRN Donielle Margaretann LovelessM Zimmerman, PA-C      . lactated ringers infusion   Intravenous Continuous Donielle Margaretann LovelessM Zimmerman, PA-C      . lactated ringers infusion   Intravenous Continuous Donielle Margaretann LovelessM Zimmerman, PA-C      . meclizine (ANTIVERT) tablet 12.5 mg  12.5 mg Oral BID PRN [name] Hendrickson, MD   12.5 mg at 06/11/16 0859  . metoprolol (LOPRESSOR) injection 2.5-5 mg  2.5-5 mg Intravenous Q2H PRN Ardelle Ballsonielle M Zimmerman, PA-C      . metoprolol tartrate (LOPRESSOR) tablet 12.5 mg  12.5 mg Oral BID Ardelle Ballsonielle M Zimmerman, PA-C   12.5 mg at 06/11/16 [id number]   Or  . metoprolol tartrate (LOPRESSOR) 25 mg/10 mL oral suspension 12.5 mg  12.5 mg Per Tube BID Ardelle Ballsonielle M Zimmerman, PA-C      . morphine 2 MG/ML injection 2-5 mg  2-5 mg Intravenous Q1H PRN Ardelle Ballsonielle M Zimmerman, PA-C   4 mg at 06/10/16 1036  . nitroGLYCERIN 50 mg in dextrose 5 % 250 mL (0.2 mg/mL) infusion  0-100 mcg/min Intravenous Titrated Donielle Margaretann LovelessM Zimmerman, PA-C      . ondansetron [facility](ZOFRAN) injection 4 mg  4 mg Intravenous Q6H PRN Ardelle Ballsonielle M Zimmerman, PA-C   4 mg at 06/11/16 0717  . oxyCODONE (Oxy IR/ROXICODONE) immediate release tablet 5-10 mg  5-10 mg Oral Q3H PRN Ardelle Ballsonielle M Zimmerman, PA-C   5 mg at 06/10/16 1855  . pantoprazole (PROTONIX) EC tablet 40 mg  40 mg Oral Daily Donielle Margaretann LovelessM Zimmerman, PA-C   40 mg at 06/11/16 0859  . phenylephrine (NEO-SYNEPHRINE) 20 mg in sodium chloride 0.9 % 250 mL (0.08 mg/mL) infusion  0-100 mcg/min Intravenous Titrated Ardelle Ballsonielle M Zimmerman, PA-C   Stopped at 06/10/16 0400  . sodium chloride flush (NS) 0.9 % injection 3 mL  3 mL  Intravenous Q12H Donielle Margaretann LovelessM Zimmerman, PA-C   3 mL at 06/11/16 1000  . sodium chloride flush (NS) 0.9 % injection 3 mL  3 mL Intravenous PRN Donielle M Zimmerman, PA-C      . timolol (TIMOPTIC) 0.5 % ophthalmic solution 1 drop  1 drop Right Eye BID Meredeth IdeGagan S Lama, MD   1 drop at 06/11/16 0900  . traMADol (ULTRAM) tablet 50-100 mg  50-100 mg Oral Q4H PRN Ardelle Ballsonielle M Zimmerman, PA-C      . witch hazel-glycerin (  TUCKS) pad   Topical PRN Eduard Clos, MD         Discharge Medications: Please see discharge summary for a list of discharge medications.  Relevant Imaging Results:  Relevant Lab Results:   Additional Information SSN: [id number]  Maree Krabbe, LCSW

## 2016-06-12 ENCOUNTER — Encounter (HOSPITAL_COMMUNITY): Payer: Self-pay | Admitting: Cardiothoracic Surgery

## 2016-06-12 ENCOUNTER — Inpatient Hospital Stay (HOSPITAL_COMMUNITY): Payer: Medicare HMO

## 2016-06-12 LAB — TYPE AND SCREEN
ABO/RH(D): B NEG
Antibody Screen: NEGATIVE
Unit division: 0
Unit division: 0
Unit division: 0
Unit division: 0

## 2016-06-12 LAB — BASIC METABOLIC PANEL
ANION GAP: 7 (ref 5–15)
BUN: 14 mg/dL (ref 6–20)
CALCIUM: 8.3 mg/dL — AB (ref 8.9–10.3)
CO2: 28 mmol/L (ref 22–32)
Chloride: 104 mmol/L (ref 101–111)
Creatinine, Ser: 0.91 mg/dL (ref 0.44–1.00)
GFR calc non Af Amer: 60 mL/min — ABNORMAL LOW (ref >60)
Glucose, Bld: 94 mg/dL (ref 65–99)
Potassium: 3.3 mmol/L — ABNORMAL LOW (ref 3.5–5.1)
SODIUM: 139 mmol/L (ref 135–145)

## 2016-06-12 LAB — POCT I-STAT, CHEM 8
BUN: 10 mg/dL (ref 6–20)
Calcium, Ion: 1.16 mmol/L (ref 1.15–1.40)
Chloride: 106 mmol/L (ref 101–111)
Creatinine, Ser: 0.8 mg/dL (ref 0.44–1.00)
Glucose, Bld: 137 mg/dL — ABNORMAL HIGH (ref 65–99)
HEMATOCRIT: 26 % — AB (ref 36.0–46.0)
HEMOGLOBIN: 8.8 g/dL — AB (ref 12.0–15.0)
POTASSIUM: 3.9 mmol/L (ref 3.5–5.1)
Sodium: 141 mmol/L (ref 135–145)
TCO2: 23 mmol/L (ref 0–100)

## 2016-06-12 LAB — GLUCOSE, CAPILLARY
GLUCOSE-CAPILLARY: 97 mg/dL (ref 65–99)
GLUCOSE-CAPILLARY: 138 mg/dL — AB (ref 65–99)
Glucose-Capillary: 108 mg/dL — ABNORMAL HIGH (ref 65–99)
Glucose-Capillary: 115 mg/dL — ABNORMAL HIGH (ref 65–99)

## 2016-06-12 LAB — BPAM RBC
Blood Product Expiration Date: 201803242359
Blood Product Expiration Date: 201804032359
Blood Product Expiration Date: 201804042359
Blood Product Expiration Date: 201804042359
ISSUE DATE / TIME: 201803160801
ISSUE DATE / TIME: 201803160801
Unit Type and Rh: 1700
Unit Type and Rh: 1700
Unit Type and Rh: 1700
Unit Type and Rh: 1700

## 2016-06-12 LAB — CBC
HCT: 24 % — ABNORMAL LOW (ref 36.0–46.0)
HEMOGLOBIN: 8.1 g/dL — AB (ref 12.0–15.0)
MCH: 30.7 pg (ref 26.0–34.0)
MCHC: 33.8 g/dL (ref 30.0–36.0)
MCV: 90.9 fL (ref 78.0–100.0)
Platelets: 153 10*3/uL (ref 150–400)
RBC: 2.64 MIL/uL — AB (ref 3.87–5.11)
RDW: 14.3 % (ref 11.5–15.5)
WBC: 11.2 10*3/uL — AB (ref 4.0–10.5)

## 2016-06-12 MED ORDER — DOCUSATE SODIUM 100 MG PO CAPS
200.0000 mg | ORAL_CAPSULE | Freq: Every day | ORAL | Status: DC
  Administered 2016-06-12 – 2016-06-15 (×4): 200 mg via ORAL
  Filled 2016-06-12 (×4): qty 2

## 2016-06-12 MED ORDER — METOPROLOL TARTRATE 12.5 MG HALF TABLET
12.5000 mg | ORAL_TABLET | Freq: Two times a day (BID) | ORAL | Status: DC
  Administered 2016-06-12 – 2016-06-14 (×5): 12.5 mg via ORAL
  Filled 2016-06-12 (×5): qty 1

## 2016-06-12 MED ORDER — PANTOPRAZOLE SODIUM 40 MG PO TBEC
40.0000 mg | DELAYED_RELEASE_TABLET | Freq: Every day | ORAL | Status: DC
  Administered 2016-06-12 – 2016-06-15 (×4): 40 mg via ORAL
  Filled 2016-06-12 (×4): qty 1

## 2016-06-12 MED ORDER — SODIUM CHLORIDE 0.9 % IV SOLN
30.0000 meq | Freq: Once | INTRAVENOUS | Status: AC
  Administered 2016-06-12: 30 meq via INTRAVENOUS
  Filled 2016-06-12: qty 15

## 2016-06-12 MED ORDER — ASPIRIN EC 325 MG PO TBEC
325.0000 mg | DELAYED_RELEASE_TABLET | Freq: Every day | ORAL | Status: DC
  Administered 2016-06-13 – 2016-06-15 (×3): 325 mg via ORAL
  Filled 2016-06-12 (×4): qty 1

## 2016-06-12 MED ORDER — INSULIN ASPART 100 UNIT/ML ~~LOC~~ SOLN: 0.0000 [IU] | Freq: Three times a day (TID) | SUBCUTANEOUS | Status: DC

## 2016-06-12 MED ORDER — SODIUM CHLORIDE 0.9% FLUSH: 3.0000 mL | INTRAVENOUS | Status: DC | PRN

## 2016-06-12 MED ORDER — SODIUM CHLORIDE 0.9% FLUSH
3.0000 mL | Freq: Two times a day (BID) | INTRAVENOUS | Status: DC
  Administered 2016-06-12 – 2016-06-15 (×7): 3 mL via INTRAVENOUS

## 2016-06-12 MED ORDER — OXYCODONE HCL 5 MG PO TABS: 5.0000 mg | ORAL_TABLET | ORAL | Status: DC | PRN

## 2016-06-12 MED ORDER — ONDANSETRON HCL 4 MG/2ML IJ SOLN: 4.0000 mg | Freq: Four times a day (QID) | INTRAMUSCULAR | Status: DC | PRN

## 2016-06-12 MED ORDER — BISACODYL 5 MG PO TBEC
10.0000 mg | DELAYED_RELEASE_TABLET | Freq: Every day | ORAL | Status: DC | PRN
  Administered 2016-06-14: 10 mg via ORAL
  Filled 2016-06-12: qty 2

## 2016-06-12 MED ORDER — MOVING RIGHT ALONG BOOK
Freq: Once | Status: AC
  Administered 2016-06-12: 08:00:00
  Filled 2016-06-12: qty 1

## 2016-06-12 MED ORDER — ACETAMINOPHEN 325 MG PO TABS
650.0000 mg | ORAL_TABLET | Freq: Four times a day (QID) | ORAL | Status: DC | PRN
Start: 2016-06-12 — End: 2016-06-15

## 2016-06-12 MED ORDER — ONDANSETRON HCL 4 MG PO TABS: 4.0000 mg | ORAL_TABLET | Freq: Four times a day (QID) | ORAL | Status: DC | PRN

## 2016-06-12 MED ORDER — TRAMADOL HCL 50 MG PO TABS: 50.0000 mg | ORAL_TABLET | Freq: Four times a day (QID) | ORAL | Status: DC | PRN

## 2016-06-12 MED ORDER — SODIUM CHLORIDE 0.9 % IV SOLN: 250.0000 mL | INTRAVENOUS | Status: DC | PRN

## 2016-06-12 MED ORDER — BISACODYL 10 MG RE SUPP: 10.0000 mg | Freq: Every day | RECTAL | Status: DC | PRN

## 2016-06-12 NOTE — Op Note (Signed)
NAME:  Sheryl Carter, Sheryl Carter                 ACCOUNT NO.:  MEDICAL RECORD NO.:  [id number]  LOCATION:                                 FACILITY:  PHYSICIAN:  Sheliah PlaneEdward Emmerson Taddei, MD    DATE OF BIRTH:  [birth year]  DATE OF PROCEDURE:  06/09/2016 DATE OF DISCHARGE:                              OPERATIVE REPORT   PREOPERATIVE DIAGNOSES:  Coronary occlusive disease with recent subendocardial myocardial infarction.  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  SURGICAL PROCEDURE:  Coronary artery bypass grafting x4 with the left internal mammary to the left anterior descending coronary artery, reverse saphenous vein graft to the second diagonal coronary artery, reverse saphenous vein graft to the first obtuse marginal, reverse saphenous vein graft to the distal right coronary artery with bilateral greater saphenous endoscopic vein harvesting from the thighs.  SURGEON:  Sheliah PlaneEdward Kady Toothaker, MD  FIRST ASSISTANT:  Doree Fudgeonielle Zimmerman, PA.  BRIEF HISTORY:  The patient is a 76 year old female who in early March and had undergone a left carotid endarterectomy.  She initially did well from this, but several days prior to surgery, she presented to [facility] feeling poorly.  Initially, there was a concern whether she had a stroke, there was evaluation for this, her troponin was elevated, and she was [facility] care. She underwent cardiac catheterization, which demonstrated severe 3- vessel coronary artery disease including 80% to 90% left main and proximal LAD disease, and she has also had 2 diagonal branches, the first one was relatively small and the second one slightly larger with 90% stenosis.  The right coronary artery was subtotally occluded with some collateral filling of the distal vessel.  Overall, ventricular function was preserved but with inferior hypokinesis.  The patient at the time  of transfer had been labeled a do not resuscitate.  After discussing with her and her daughter, the patient notes that she did not intend to this but did have a living will.  The do not resuscitate status was changed after consulting with her and her daughter.  Risks and options of surgery were discussed with her and her daughter in detail and she was willing to proceed.  DESCRIPTION OF PROCEDURE:  With Swan-Ganz and arterial line monitors in place, the patient underwent general endotracheal anesthesia without incident.  Skin of the chest and legs was prepped with Betadine and draped in usual sterile manner.  Appropriate time-out was performed.  We proceeded with endo vein harvesting of the right greater saphenous vein endoscopically from the right thigh.  The patient was at a body surface area of 1.69 but relatively short.  The vein below the leg was small, so additional vein was harvested endoscopically from the left thigh and was of adequate quality and caliber.  Median sternotomy was performed, and the patient's left internal mammary artery was dissected down as a pedicle graft.  The distal artery was divided, had good free flow.  The pericardium was opened.  The patient was systemically heparinized. Ascending aorta was cannulated.  The right atrium was cannulated with a dual stage venous cannula.  The patient was placed on cardiopulmonary bypass  2.4 L/min/m2.  Sites of anastomosis were selected and dissected out of the epicardium.  The patient's body temperature cooled to 32 degrees.  Aortic crossclamp was applied and 500 mL cold blood potassium cardioplegia was administered with diastolic arrest of the heart. Myocardial septal temperature was monitored throughout the crossclamp. Attention was turned first to the distal right coronary artery.  The vessel was opened distally, and just at the takeoff, from just distal to the high takeoff of the posterior descending, vessel was opened  and was of a good quality, 0.5 mm probe passed distally without difficulty. Using a running 7-0 Prolene, a distal anastomosis was performed with a segment of reverse saphenous vein graft.  The heart was then elevated. The first obtuse marginal was a small vessel but was suitable for bypass.  The second obtuse marginal was a very small vessel, too small for bypass.  The first OM was opened and a 1-mm probe passed distally. Using a running 8-0 Prolene, a segment of reverse saphenous vein graft was anastomosed to the first OM.  Attention was then turned to the second diagonal.  As noted, the first diagonal was very relatively small and the second diagonal was slightly larger and had a more distal course.  The vessel was opened and admitted a 1-mm probe distally. Using a running 8-0 Prolene, a segment of reverse saphenous vein graft was anastomosed to the second diagonal.  The left anterior descending coronary artery was then opened, admitted a 1-mm probe distally.  Using a running 8-0 Prolene, the left internal mammary artery was anastomosed to the left anterior descending coronary artery.  With the cross-clamp still in place, 3 punch aortotomies were performed and each of the 3 vein grafts were anastomosed to the ascending aorta.  The bulldog on the mammary artery was removed with prompt rise in myocardial septal temperature.  With the crossclamp still in place, the heart was allowed to passively fill and de-air and the proximal anastomoses were completed.  Aortic crossclamp was then removed with total cross-clamp time of 96 minutes.  The patient spontaneously converted to a sinus rhythm.  Sites of anastomosis were inspected and were free of bleeding. The patient was then ventilated and weaned from cardiopulmonary bypass without difficulty.  She remained hemodynamically stable.  She was decannulated in usual fashion.  Protamine sulfate was administered. Operative field is hemostatic.   Atrial and ventricular pacing wires were applied.  Graft marker was applied.  A left pleural tube and a Blake mediastinal drain were left in place.  The patient's pericardium was loosely reapproximated.  The sternum closed with #6 stainless steel wire.  Fascia closed with interrupted 0 Vicryl, running 3-0 Vicryl in subcutaneous tissue, 4-0 subcuticular stitch in skin edges.  Dry dressings were applied.  Sponge and needle count was reported as correct at completion of procedure.  Total pump time was 134 minutes.  The patient because of low hematocrit prior to surgery did require 1 unit of packed red blood cells during the operative procedure.     Sheliah Plane, MD     EG/MEDQ  D:  06/11/2016  T:  06/11/2016  Job:  [id number]

## 2016-06-12 NOTE — Progress Notes (Addendum)
CARDIAC REHAB PHASE I   PRE:  Rate/Rhythm: 86 SR  BP:  Sitting: 127/67        SaO2: 95 RA  MODE:  Ambulation: 120 ft   POST:  Rate/Rhythm: 105 ST  BP:  Sitting: 149/73         SaO2: 97 RA  Pt stood with minimal assistance. Pt ambulated 120 ft on RA, rolling walker, assist x2 (for safety), slow, steady gait, tolerated fairly well. Pt c/o mild DOE, fatigue with distance, seated rest x1. VSS. Pt to bathroom, then to recliner after walk, feet elevated, call bell within reach.  Pt may benefit from use of pediatric walker due to height. Encouraged IS, additional ambulation x1 today. Will follow.   [id number] Joylene GrapesEmily C Jazzman Loughmiller, RN, BSN 06/12/2016 2:45 PM

## 2016-06-12 NOTE — Plan of Care (Signed)
Problem: Activity: Goal: Ability to return to baseline activity level will improve Outcome: Progressing Pt was able to ambulate 3800ft twice on 3/18  Problem: Bowel/Gastric: Goal: Gastrointestinal status for postoperative course will improve Outcome: Progressing Pt had first bowel movement 3/18

## 2016-06-12 NOTE — Progress Notes (Signed)
Physical Therapy Treatment Patient Details Name: Sheryl Carter MRN: [id number] DOB: [birth year] Today's Date: 06/12/2016    History of Present Illness Pt is a 76 y.o. female who presented to [facility] with suspected TIA; recent left carotid endarterectomy on 05/29/2016. 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 Elevated troponin upon admission determined to be NSTEMI. 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 Pertinent PMH includes TIA, HTN, glaucoma, DVT, anxiety, L-eye blindness.    PT Comments    Pt progressing toward goals with decreased physical assist needed for mobility, however, pt with decrease in ambulation distance secondary to weakness/fatigue. Current d/c recommendation remains appropriate when medically ready. PT will continue to follow.    Follow Up Recommendations  SNF     Equipment Recommendations  None recommended by PT    Recommendations for Other Services       Precautions / Restrictions Precautions Precautions: Sternal Restrictions Weight Bearing Restrictions: Yes (sternal precautions)    Mobility  Bed Mobility Overal bed mobility: Needs Assistance Bed Mobility: Supine to Sit;Sit to Supine     Supine to sit: Min assist;HOB elevated Sit to supine: Min assist   General bed mobility comments: minA for pt to get hips to EOB with HOB elevated, minA to return LE to bed from seated with verbal cues for use of LE to shift weight in bed  Transfers Overall transfer level: Needs assistance Equipment used: Rolling walker (2 wheeled) Transfers: Sit to/from [employer] Sit to Stand: Min guard Stand pivot transfers: Min guard       General transfer comment: min guard to stand for safety, use of heart pillow to remain compliant with sternal precautions, stood x 3 and stand pvt to [facility]  Ambulation/Gait Ambulation/Gait assistance: Min assist Ambulation Distance (Feet): 100 Feet (69ft, seated break,  62ft) Assistive device: Rolling walker (2 wheeled) Gait Pattern/deviations: Step-through pattern;Decreased stride length;Trunk flexed Gait velocity: decreased Gait velocity interpretation: Below normal speed for age/gender General Gait Details: verbal cues for upright posture and safety with RW, slow speed with pt complaints of weakness   Stairs            Wheelchair Mobility    Modified Rankin (Stroke Patients Only)       Balance Overall balance assessment: Needs assistance Sitting-balance support: Feet supported Sitting balance-Leahy Scale: Fair Sitting balance - Comments: sat EOB x 3 min with LOB   Standing balance support: Single extremity supported;During functional activity Standing balance-Leahy Scale: Fair Standing balance comment: able to stand without 1 HHA to take steps to bed from [facility]                    Cognition Arousal/Alertness: Awake/alert Behavior During Therapy: [facility] for tasks assessed/performed Overall Cognitive Status: Impaired/Different from baseline       Memory: Decreased recall of precautions         General Comments: pt unable to recall sternal precautions and educated for all    Exercises      General Comments General comments (skin integrity, edema, etc.): HR 85-93, SpO2 97-99% on RA, pre ambulation BP 141/75, post ambulation BP 151/82      Pertinent Vitals/Pain Pain Assessment: No/denies pain    Home Living                      Prior Function            PT Goals (current goals can now  be found in the care plan section)      Frequency    Min 3X/week      PT Plan Current plan remains appropriate    Co-evaluation             End of Session Equipment Utilized During Treatment: Gait belt Activity Tolerance: Patient limited by fatigue Patient left: in bed;with call bell/phone within reach;with family/visitor present Nurse Communication: Mobility status PT Visit Diagnosis: Unsteadiness on feet  (R26.81);Other abnormalities of gait and mobility (R26.89);Muscle weakness (generalized) (M62.81);Difficulty in walking, not elsewhere classified (R26.2)     Time: [id number] PT Time Calculation (min) (ACUTE ONLY): 30 min  Charges:  $Gait Training: 8-22 mins $Therapeutic Activity: 8-22 mins                    G Codes:       Lane Hackerrika Athanasius Kesling 06/12/2016, 10:15 AM  Lane HackerErika Nyah Shepherd, SPT Acute Rehab SPT [phone]

## 2016-06-12 NOTE — Discharge Instructions (Signed)
We ask the SNF to please do the following: °1. Please obtain vital signs at least one time daily °2.Please weigh the patient daily. If he or she continues to gain weight or develops lower extremity edema, contact the office at [phone]. °3. Ambulate patient at least three times daily and please use sternal precautions. ° °Coronary Artery Bypass Grafting, Care After °This sheet gives you information about how to care for yourself after your procedure. Your health care provider may also give you more specific instructions. If you have problems or questions, contact your health care provider. °What can I expect after the procedure? °After the procedure, it is common to have: °· Nausea and a lack of appetite. °· Constipation. °· Weakness and fatigue. °· Depression or irritability. °· Pain or discomfort in your incision areas. °Follow these instructions at home: °Medicines  °· Take over-the-counter and prescription medicines only as told by your health care provider. Do not stop taking medicines or start any new medicines without approval from your health care provider. °· If you were prescribed an antibiotic medicine, take it as told by your health care provider. Do not stop taking the antibiotic even if you start to feel better. °· Do not drive or use heavy machinery while taking prescription pain medicine. °Incision care  °· Follow instructions from your health care provider about how to take care of your incisions. Make sure you: °¨ Wash your hands with soap and water before you change your bandage (dressing). If soap and water are not available, use hand sanitizer. °¨ Change your dressing as told by your health care provider. °¨ Leave stitches (sutures), skin glue, or adhesive strips in place. These skin closures may need to stay in place for 2 weeks or longer. If adhesive strip edges start to loosen and curl up, you may trim the loose edges. Do not remove adhesive strips completely unless your health care  provider tells you to do that. °· Keep incision areas clean, dry, and protected. °· Check your incision areas every day for signs of infection. Check for: °¨ More redness, swelling, or pain. °¨ More fluid or blood. °¨ Warmth. °¨ Pus or a bad smell. °· If incisions were made in your legs: °¨ Avoid crossing your legs. °¨ Avoid sitting for long periods of time. Change positions every 30 minutes. °¨ Raise (elevate) your legs when you are sitting. °Bathing  °· Do not take baths, swim, or use a hot tub until your health care provider approves. °· Only take sponge baths. Pat the incisions dry. Do not rub incisions with a washcloth or towel. °· Ask your health care provider when you can shower. °Eating and drinking  °· Eat foods that are high in fiber, such as raw fruits and vegetables, whole grains, beans, and nuts. Meats should be lean cut. Avoid canned, processed, and fried foods. This can help prevent constipation and is a recommended part of a heart-healthy diet. °· Drink enough fluid to keep your urine clear or pale yellow. °· Limit alcohol intake to no more than 1 drink a day for nonpregnant women and 2 drinks a day for men. One drink equals 12 oz of beer, 5 oz of wine, or 1½ oz of hard liquor. °Activity  °· Rest and limit your activity as told by your health care provider. You may be instructed to: °¨ Stop any activity right away if you have chest pain, shortness of breath, irregular heartbeats, or dizziness. Get help right away if you   have any of these symptoms. °¨ Move around frequently for short periods or take short walks as directed by your health care provider. Gradually increase your activities. You may need physical therapy or cardiac rehabilitation to help strengthen your muscles and build your endurance. °¨ Avoid lifting, pushing, or pulling anything that is heavier than 10 lb (4.5 kg) for at least 6 weeks or as told by your health care provider. °· Do not drive until your health care provider  approves. °· Ask your health care provider when you may return to work. °· Ask your health care provider when you may resume sexual activity. °General instructions  °· Do not use any products that contain nicotine or tobacco, such as cigarettes and e-cigarettes. If you need help quitting, ask your health care provider. °· Take 2-3 deep breaths every few hours during the day, while you recover. This helps expand your lungs and prevent complications like pneumonia after surgery. °· If you were given a device called an incentive spirometer, use it several times a day to practice deep breathing. Support your chest with a pillow or your arms when you take deep breaths or cough. °· Wear compression stockings as told by your health care provider. These stockings help to prevent blood clots and reduce swelling in your legs. °· Weigh yourself every day. This helps identify if your body is holding (retaining) fluid that may make your heart and lungs work harder. °· Keep all follow-up visits as told by your health care provider. This is important. °Contact a health care provider if: °· You have more redness, swelling, or pain around any incision. °· You have more fluid or blood coming from any incision. °· Any incision feels warm to the touch. °· You have pus or a bad smell coming from any incision °· You have a fever. °· You have swelling in your ankles or legs. °· You have pain in your legs. °· You gain 2 lb (0.9 kg) or more a day. °· You are nauseous or you vomit. °· You have diarrhea. °Get help right away if: °· You have chest pain that spreads to your jaw or arms. °· You are short of breath. °· You have a fast or irregular heartbeat. °· You notice a "clicking" in your breastbone (sternum) when you move. °· You have numbness or weakness in your arms or legs. °· You feel dizzy or light-headed. °Summary °· After the procedure, it is common to have pain or discomfort in the incision areas. °· Do not take baths, swim, or use a  hot tub until your health care provider approves. °· Gradually increase your activities. You may need physical therapy or cardiac rehabilitation to help strengthen your muscles and build your endurance. °· Weigh yourself every day. This helps identify if your body is holding (retaining) fluid that may make your heart and lungs work harder. °This information is not intended to replace advice given to you by your health care provider. Make sure you discuss any questions you have with your health care provider. °Document Released: 09/30/2004 Document Revised: 01/31/2016 Document Reviewed: 01/31/2016 °Elsevier Interactive Patient Education © 2017 [employer] ° °

## 2016-06-12 NOTE — Progress Notes (Signed)
Occupational Therapy Evaluation Patient Details Name: Sheryl Carter MRN: [id number] DOB: [birth year] Today's Date: 06/12/2016    History of Present Illness Pt is a 76 y.o. female who presented to [facility] with suspected TIA; recent left carotid endarterectomy on 05/29/2016. 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 Elevated troponin upon admission determined to be NSTEMI. 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 Pertinent PMH includes TIA, HTN, glaucoma, DVT, anxiety, L-eye blindness.   Clinical Impression   PTA, pt lived alone and was independent with ADL and mobility. Pt currently requires min A with ADL and mobility due ot below deficits. Pt will benefit from rehab at SNF to facilitate safe DC home at [facility]. Will follow acutely to address established goals and maximize functional level of independence.     Follow Up Recommendations  Supervision/Assistance - 24 hour;SNF    Equipment Recommendations  Other (comment) (TBA at next venue)    Recommendations for Other Services       Precautions / Restrictions Precautions Precautions: Sternal;Fall Restrictions Weight Bearing Restrictions: Yes (sternal precautions)      Mobility Bed Mobility               General bed mobility comments: OOB in chair  Transfers Overall transfer level: Needs assistance Equipment used:  Transfers: Sit to/from Stand;Stand Pivot Transfers Sit to Stand: Min guard Stand pivot transfers: Min guard         Balance Overall balance assessment: Needs assistance   Sitting balance-Leahy Scale: Good     Standing balance support: Single extremity supported;During functional activity Standing balance-Leahy Scale: Fair Standing balance comment: able to stand without UE support for short period of time                            ADL Overall ADL's : Needs assistance/impaired     Grooming: Set up;Sitting   Upper Body Bathing: Supervision/  safety;Sitting;Set up   Lower Body Bathing: Moderate assistance;Sit to/from stand   Upper Body Dressing : Minimal assistance;Sitting   Lower Body Dressing: Moderate assistance;Sit to/from stand   Toilet Transfer: Min guard;Ambulation   Toileting- Clothing Manipulation and Hygiene: Minimal assistance;Sit to/from stand       Functional mobility during ADLs: Min guard       Vision Baseline Vision/History: Wears glasses;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) Wears Glasses: At all times Patient Visual Report: No change from baseline Additional Comments: wears glasses; blind L eye     Perception     Praxis      Pertinent Vitals/Pain Pain Assessment: No/denies pain ("but I don't feel good")     Hand Dominance Right   Extremity/Trunk Assessment Upper Extremity Assessment Upper Extremity Assessment: Generalized weakness   Lower Extremity Assessment Lower Extremity Assessment: Generalized weakness   Cervical / Trunk Assessment Cervical / Trunk Assessment: Normal   Communication Communication Communication: No difficulties   Cognition Arousal/Alertness: Awake/alert Behavior During Therapy: WFL for tasks assessed/performed Overall Cognitive Status: Within Functional Limits for tasks assessed (will further assess)   Orientation Level:  (Day)                 General Comments       Exercises       Shoulder Instructions      Home Living Family/patient expects to be discharged to:: Skilled nursing facility Living Arrangements: Alone Available Help at Discharge: Available PRN/intermittently;Family (son and daughter) Type of Home:  Mobile home Home Access: Ramped entrance     Home Layout: One level     Bathroom Shower/Tub: Producer, television/film/videoWalk-in shower   Bathroom Toilet: Standard     Home Equipment: Cane - single point;Walker - standard          Prior Functioning/Environment Level of Independence: Needs assistance  Gait / Transfers Assistance Needed: Mod indep with cane.  Does not drive. ADL's / Homemaking Assistance Needed: Daughter and son help with transportation. Pt indep with ADLs.    Comments: History of vertigo at baseline.         OT Problem List: Impaired balance (sitting and/or standing);Decreased strength;Decreased activity tolerance;Decreased safety awareness;Decreased knowledge of use of DME or AE;Decreased knowledge of precautions;Cardiopulmonary status limiting activity;Obesity      OT Treatment/Interventions: Self-care/ADL training;Therapeutic exercise;Energy conservation;DME and/or AE instruction;Therapeutic activities;Patient/family education;Balance training    OT Goals(Current goals can be found in the care plan section) Acute Rehab OT Goals Patient Stated Goal: Return home and be independent OT Goal Formulation: With patient Time For Goal Achievement: 06/26/16 Potential to Achieve Goals: Good  OT Frequency: Min 2X/week   Barriers to D/C: Decreased caregiver support          Co-evaluation              End of Session Equipment Utilized During Treatment:  (cane) Nurse Communication: Mobility status  Activity Tolerance: Patient tolerated treatment well Patient left: in chair;with call bell/phone within reach  OT Visit Diagnosis: Other abnormalities of gait and mobility (R26.89);Muscle weakness (generalized) (M62.81)                ADL either performed or assessed with clinical judgement  Time: [id number] OT Time Calculation (min): 15 min Charges:  OT General Charges $OT Visit: 1 Procedure OT Evaluation $OT Eval Moderate Complexity: 1 Procedure G-Codes:   Sheryl Carter, OT/L  [phone] 06/12/2016  Sheryl Carter,Sheryl Carter 06/12/2016, 1:24 PM

## 2016-06-12 NOTE — Plan of Care (Signed)
Problem: Urinary Elimination: Goal: Ability to achieve and maintain adequate renal perfusion and functioning will improve Outcome: Progressing Pt has had adequate urine output post foley catheter removal

## 2016-06-12 NOTE — Progress Notes (Signed)
Pt ambulated 150 ft with rolling walker, pt tolerated well.  Will continue to monitor.

## 2016-06-12 NOTE — Progress Notes (Signed)
Patient ID: Sheryl Carter, female   DOB: Oct 09, 1940, 76 y.o.   MRN: [id number] TCTS DAILY ICU PROGRESS NOTE                   [address]            [name] [address]          [phone]   3 Days Post-Op Procedure(s) (LRB): CORONARY ARTERY BYPASS GRAFTING (CABG) times 4 using the left internal mammary artery and bilateral thigh greater saphenous veins harvested endoscopically. LIMA to LAD, SVG to DIAGONAL 2, SVG to OM, SVG to RCA (N/A) TRANSESOPHAGEAL ECHOCARDIOGRAM (TEE) (N/A)  Total Length of Stay:  LOS: 5 days   Subjective: Just walked 300 feet , alert   Objective: Vital signs in last 24 hours: Temp:  [97.9 F (36.6 C)-99 F (37.2 C)] 98.7 F (37.1 C) (03/19 0400) Pulse Rate:  [68-92] 85 (03/19 0700) Cardiac Rhythm: Normal sinus rhythm (03/18 2000) BP: (110-140)/(49-91) 140/76 (03/19 0700) SpO2:  [92 %-100 %] 96 % (03/19 0700) Weight:  [197 lb 8.5 oz (89.6 kg)] 197 lb 8.5 oz (89.6 kg) (03/19 0433)  Filed Weights   06/10/16 0500 06/11/16 0500 06/12/16 0433  Weight: 197 lb 1.5 oz (89.4 kg) 199 lb 4.7 oz (90.4 kg) 197 lb 8.5 oz (89.6 kg)    Weight change: -1 lb 12.2 oz (-0.8 kg)   Hemodynamic parameters for last 24 hours:    Intake/Output from previous day: 03/18 0701 - 03/19 0700 In: 385 [I.V.:120; IV Piggyback:265] Out: 1825 [Urine:1825]  Intake/Output this shift: No intake/output data recorded.  Current Meds: Scheduled Meds: . acetaminophen  1,000 mg Oral Q6H   Or  . acetaminophen (TYLENOL) oral liquid 160 mg/5 mL  1,000 mg Per Tube Q6H  . aspirin EC  325 mg Oral Daily   Or  . aspirin  324 mg Per Tube Daily  . bisacodyl  10 mg Oral Daily   Or  . bisacodyl  10 mg Rectal Daily  . docusate sodium  200 mg Oral Daily  . enoxaparin (LOVENOX) injection  30 mg Subcutaneous Q24H  . insulin aspart  0-9 Units Subcutaneous TID WC  . metoCLOPramide (REGLAN) injection  10 mg Intravenous Q6H  . metoprolol tartrate  12.5 mg Oral BID   Or  .  metoprolol tartrate  12.5 mg Per Tube BID  . pantoprazole  40 mg Oral Daily  . potassium chloride (KCL MULTIRUN) 30 mEq in 265 mL IVPB  30 mEq Intravenous Once  . sodium chloride flush  3 mL Intravenous Q12H  . timolol  1 drop Right Eye BID   Continuous Infusions: . sodium chloride    . sodium chloride    . sodium chloride    . dexmedetomidine (PRECEDEX) IV infusion Stopped (06/09/16 1730)  . lactated ringers    . lactated ringers    . nitroGLYCERIN    . phenylephrine (NEO-SYNEPHRINE) Adult infusion Stopped (06/10/16 0400)   PRN Meds:.sodium chloride, ALPRAZolam, lactated ringers, meclizine, metoprolol, morphine injection, ondansetron (ZOFRAN) IV, oxyCODONE, sodium chloride flush, traMADol, witch hazel-glycerin  General appearance: alert, cooperative and no distress Neurologic: intact Heart: regular rate and rhythm, S1, S2 normal, no murmur, click, rub or gallop Lungs: decreased at bases Abdomen: soft, non-tender; bowel sounds normal; no masses,  no organomegaly Extremities: extremities normal, atraumatic, no cyanosis or edema and Homans sign is negative, no sign of DVT Wound: sternum stable  Lab Results: CBC: Recent Labs  06/11/16 0318 06/12/16  0400  WBC 14.8* 11.2*  HGB 8.3* 8.1*  HCT 24.6* 24.0*  PLT 135* 153   BMET:  Recent Labs  06/11/16 0318 06/12/16 0400  NA 138 139  K 4.0 3.3*  CL 106 104  CO2 26 28  GLUCOSE 110* 94  BUN 12 14  CREATININE 1.05* 0.91  CALCIUM 8.4* 8.3*    CMET: Lab Results  Component Value Date   WBC 11.2 (H) 06/12/2016   HGB 8.1 (L) 06/12/2016   HCT 24.0 (L) 06/12/2016   PLT 153 06/12/2016   GLUCOSE 94 06/12/2016   CHOL 216 (H) 06/05/2016   TRIG 128 06/05/2016   HDL 36 (L) 06/05/2016   LDLCALC 154 (H) 06/05/2016   ALT 13 (L) 06/08/2016   AST 20 06/08/2016   NA 139 06/12/2016   K 3.3 (L) 06/12/2016   CL 104 06/12/2016   CREATININE 0.91 06/12/2016   BUN 14 06/12/2016   CO2 28 06/12/2016   INR 1.51 06/09/2016   HGBA1C 5.4  06/08/2016      PT/INR:  Recent Labs  06/09/16 1447  LABPROT 18.3*  INR 1.51   Radiology: Dg Chest Port 1 View  Result Date: 06/12/2016 CLINICAL DATA:  Atelectasis, shortness of Breath EXAM: PORTABLE CHEST 1 VIEW COMPARISON:  06/11/2016 FINDINGS: Right central line remains in place, unchanged. Prior CABG. Very low lung volumes. No confluent opacities or effusions. Heart is normal size. IMPRESSION: Very low lung volumes.  No active disease. Electronically Signed   By: Charlett NoseKevin  Dover M.D.   On: 06/12/2016 07:14     Assessment/Plan: S/P Procedure(s) (LRB): CORONARY ARTERY BYPASS GRAFTING (CABG) times 4 using the left internal mammary artery and bilateral thigh greater saphenous veins harvested endoscopically. LIMA to LAD, SVG to DIAGONAL 2, SVG to OM, SVG to RCA (N/A) TRANSESOPHAGEAL ECHOCARDIOGRAM (TEE) (N/A) Mobilize Diuresis Transfer to step down Pt ot to see    Delight Ovensdward B Madylyn Insco 06/12/2016 7:31 AM

## 2016-06-12 NOTE — Discharge Summary (Signed)
Physician Discharge Summary       [address] [place]       Sheryl Carter [id number]             [phone]    Patient ID: Sheryl Carter MRN: [id number] DOB/AGE: December 13, 1940 76 y.o.  Admit date: 06/04/2016 Discharge date: 06/15/2016  Admission Diagnoses: 1. S/p NSTEMI (non-ST elevated myocardial infarction) (HCC) 2. Multivessel CAD  Active Diagnoses:  1. Hypertension, essential 2. TIA (transient ischemic attack) 3. Chronic anxiety 4. Pre diabetes (HGA1C 5.4) 5. DVT (deep venous thrombosis) (HCC) 6. ABL anemia  Procedure (s):  Left Heart Cath and Coronary Angiography by Dr. Kirke Corin on 06/07/2016:  Conclusion     Prox RCA lesion, 80 %stenosed.  Mid RCA lesion, 100 %stenosed.  Ost Cx to Prox Cx lesion, 90 %stenosed.  Ost LAD lesion, 90 %stenosed.  LM lesion, 80 %stenosed.  Prox LAD lesion, 90 %stenosed.  Ost 1st Diag to 1st Diag lesion, 80 %stenosed.  Ost 2nd Mrg to 2nd Mrg lesion, 60 %stenosed.  LV end diastolic pressure is moderately elevated.   1. Severe three-vessel and distal left main disease. 2. Moderately elevated left ventricular end-diastolic pressure. 3. Left ventricular angiography was not performed. EF was 50-55% by echo.  Recommendations: I consulted CVTS for CABG. The patient has good targets. It was determined that she had no stroke during this admission. Avoid cardiac catheterization via the right radial artery in the future due to severe innominate artery tortuosity.     Coronary artery bypass grafting x4 with the left internal mammary to the left anterior descending coronary artery, reverse saphenous vein graft to the second diagonal coronary artery, reverse saphenous vein graft to the first obtuse marginal, reverse saphenous vein graft to the distal right coronary artery with bilateral greater saphenous endoscopic vein harvesting from the thighs by Dr. Tyrone Sage on 06/09/2016.  History of Presenting Illness: The patient is a  76 year old female who is status post recent left carotid endarterectomy by Dr Arbie Cookey on 05/29/2016. She also has multiple cardiac risk factors including hypertension and hyperlipidemia. She presented to the [facility] Emergency Room on 06/04/2016 after having an episode of slow speech. She did not rule in for stroke by CT scan. In the emergency department, she was found to have an elevated troponin of 3.32 and cardiology consultation was obtained. She was transferred to [facility] for further evaluation and treatment. On03/14/2018 she was found on cardiac catheterization to have severe multivessel coronary artery disease and we are requested to see the patient for a cardiothoracic surgical consultation. Echocardiogram was done on 3/12 results are consistent with reduced ejection fraction and range of 45%, echo was noted to be at a very poor quality. Dr. Tyrone Sage discussed with the patient and her daughter in detail the risks and options of coronary artery bypass grafting. With her symptoms and critical anatomy, coronary artery bypass grafting has been recommended to her, but at slight increased risk due to her age and known cerebrovascular disease. She is willing to proceed. Potential risks, benefits, and complications of the surgery were discussed with the patient and she agreed to proceed with surgery. Carotid duplex US showed no significant internal carotid artery stenosis bilaterally.She underwent a CABG x 4 on 06/09/2016.  Brief Hospital Course:  The patient was extubated late the evening of surgery without difficulty. She remained afebrile and hemodynamically stable. Theone Murdoch, a line, chest tubes, and foley were removed early in the post operative course. Lopressor was started and titrated  accordingly. She was volume over loaded and diuresed. She had ABL anemia. She did not require a post op transfusion. Last H and H was 8.3. PT and OT consults were obtained as she would need SNF placement when  ready for discharge. She was weaned off the insulin drip.  Once she was tolerating a diet, home diabetic medicines were restarted.  The patient's glucose remained well controlled.The patient's HGA1C pre op was 5.4. The patient was felt surgically stable for transfer from the ICU to [facility] for further convalescence on 06/12/2016. She continues to progress with cardiac rehab. She was requiring 2 liters of oxygen via [place]. She  was later ambulating on room air. She has been tolerating a diet and has had a bowel movement. Epicardial pacing wires were removed on 06/13/2016. Chest tube sutures will be removed the day of discharge. The patient is felt surgically stable for discharge today.   We ask the SNF to please do the following: 1. Please obtain vital signs at least one time daily 2.Please weigh the patient daily. If he or she continues to gain weight or develops lower extremity edema, contact the office at [phone]. 3. Ambulate patient at least three times daily and please use sternal precautions.  Latest Vital Signs: Blood pressure 126/84, pulse 86, temperature 98.3 F (36.8 C), temperature source Oral, resp. rate 20, height 5\' 2"  (1.575 m), weight 188 lb (85.3 kg), SpO2 96 %.  Physical Exam: General appearance: alert, cooperative and no distress Neurologic: intact Heart: regular rate and rhythm, S1, S2 normal, no murmur, click, rub or gallop Lungs: decreased at bases Abdomen: soft, non-tender; bowel sounds normal; no masses,  no organomegaly Extremities: extremities normal, atraumatic, no cyanosis or edema and Homans sign is negative, no sign of DVT Wound: sternum stable  Discharge Condition:Stable and discharge to home.  Recent laboratory studies:  Lab Results  Component Value Date   WBC 9.9 06/13/2016   HGB 8.3 (L) 06/13/2016   HCT 25.5 (L) 06/13/2016   MCV 93.1 06/13/2016   PLT 197 06/13/2016   Lab Results  Component Value Date   NA 139 06/13/2016   K 3.5 06/13/2016   CL 102  06/13/2016   CO2 29 06/13/2016   CREATININE 0.78 06/13/2016   GLUCOSE 105 (H) 06/13/2016    Diagnostic Studies: [name]  Result Date: 06/05/2016 CLINICAL DATA:  Acute presentation with headache, confusion and speech disturbance. Numbness in the feet. EXAM: MRI HEAD WITHOUT CONTRAST MRA HEAD WITHOUT CONTRAST TECHNIQUE: Multiplanar, multiecho pulse sequences of the brain and surrounding structures were obtained without intravenous contrast. Angiographic images of the head were obtained using MRA technique without contrast. COMPARISON:  CT 06/04/2016.  MRI 04/18/2016. FINDINGS: MRI HEAD FINDINGS Brain: Diffusion imaging does not show any acute or subacute infarction. The brainstem is normal. There is an old infarction in the inferior cerebellum on the right which is unchanged. Cerebral hemispheres show age related atrophy with moderate chronic small-vessel ischemic changes affecting the deep and subcortical white matter. No mass lesion, hemorrhage, hydrocephalus or extra-axial collection. No visible change since the previous study. Vascular: Major vessels at the base of the brain show flow. Skull and upper cervical spine: Negative Sinuses/Orbits: Clear/ normal. Small amount of mastoid air cell fluid. Other: None significant MRA HEAD FINDINGS Both internal carotid artery's are patent through the skullbase and siphon regions. There is considerable atherosclerotic irregularity in the carotid siphon regions but without suspicion of flow limiting stenosis. Supraclinoid internal carotid arteries are normal.  Anterior and middle cerebral arteries are widely patent. Fetal origin left PCA. Left vertebral artery is dominant. Small right vertebral artery show antegrade flow to PICA up. No flow an the right vertebral artery beyond PICA. Basilar artery shows some atherosclerotic irregularity without flow limiting stenosis. Anterior inferior cerebellar arteries, superior cerebellar arteries and posterior cerebral  arteries are patent bilaterally. Left PCA takes fetal origin as noted previously. IMPRESSION: MRI head: No acute finding. 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 Moderate chronic small-vessel ischemic changes affecting the cerebral hemispheres as seen previously. MRA head: No change. Atherosclerotic change in both carotid siphon regions without flow limiting stenosis. No intracranial large or medium vessel anterior circulation abnormal finding. Small right vertebral artery which gives flow to right PICA but does not contribute to the basilar. Dominant left vertebral artery widely patent to the basilar. Atherosclerotic irregularity of the basilar artery without flow limiting stenosis. Electronically Signed   By: Paulina Fusi M.D.   On: 06/05/2016 07:43   Dg Chest Port 1 View  Result Date: 06/12/2016 CLINICAL DATA:  Atelectasis, shortness of Breath EXAM: PORTABLE CHEST 1 VIEW COMPARISON:  06/11/2016 FINDINGS: Right central line remains in place, unchanged. Prior CABG. Very low lung volumes. No confluent opacities or effusions. Heart is normal size. IMPRESSION: Very low lung volumes.  No active disease. Electronically Signed   By: Charlett Nose M.D.   On: 06/12/2016 07:14   [name] Head/brain ZO Cm  Result Date: 06/05/2016 CLINICAL DATA:  Acute presentation with headache, confusion and speech disturbance. Numbness in the feet. EXAM: MRI HEAD WITHOUT CONTRAST MRA HEAD WITHOUT CONTRAST TECHNIQUE: Multiplanar, multiecho pulse sequences of the brain and surrounding structures were obtained without intravenous contrast. Angiographic images of the head were obtained using MRA technique without contrast. COMPARISON:  CT 06/04/2016.  MRI 04/18/2016. FINDINGS: MRI HEAD FINDINGS Brain: Diffusion imaging does not show any acute or subacute infarction. The brainstem is normal. There is an old infarction in the inferior cerebellum on the right which is unchanged. Cerebral hemispheres show age related atrophy with  moderate chronic small-vessel ischemic changes affecting the deep and subcortical white matter. No mass lesion, hemorrhage, hydrocephalus or extra-axial collection. No visible change since the previous study. Vascular: Major vessels at the base of the brain show flow. Skull and upper cervical spine: Negative Sinuses/Orbits: Clear/ normal. Small amount of mastoid air cell fluid. Other: None significant MRA HEAD FINDINGS Both internal carotid artery's are patent through the skullbase and siphon regions. There is considerable atherosclerotic irregularity in the carotid siphon regions but without suspicion of flow limiting stenosis. Supraclinoid internal carotid arteries are normal. Anterior and middle cerebral arteries are widely patent. Fetal origin left PCA. Left vertebral artery is dominant. Small right vertebral artery show antegrade flow to PICA up. No flow an the right vertebral artery beyond PICA. Basilar artery shows some atherosclerotic irregularity without flow limiting stenosis. Anterior inferior cerebellar arteries, superior cerebellar arteries and posterior cerebral arteries are patent bilaterally. Left PCA takes fetal origin as noted previously. IMPRESSION: MRI head: No acute finding. 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 Moderate chronic small-vessel ischemic changes affecting the cerebral hemispheres as seen previously. MRA head: No change. Atherosclerotic change in both carotid siphon regions without flow limiting stenosis. No intracranial large or medium vessel anterior circulation abnormal finding. Small right vertebral artery which gives flow to right PICA but does not contribute to the basilar. Dominant left vertebral artery widely patent to the basilar. Atherosclerotic irregularity of the basilar artery without flow  limiting stenosis. Electronically Signed   By: Paulina Fusi M.D.   On: 06/05/2016 07:43   Ct Head Code Stroke W/o Cm  Result Date: 06/04/2016 CLINICAL DATA:  Code  stroke. Headache, confusion, speech difficulty beginning at 3 p.m. Status post LEFT carotid stent 1 week ago with subsequent foot numbness. History of hypertension, LEFT eye blindness, stroke. EXAM: CT HEAD WITHOUT CONTRAST TECHNIQUE: Contiguous axial images were obtained from the base of the skull through the vertex without intravenous contrast. COMPARISON:  MRI head and CTA HEAD April 18, 2016 FINDINGS: BRAIN: Stable moderate to severe ventriculomegaly on the basis of global parenchymal brain volume loss. Old RIGHT cerebellar infarct. No intraparenchymal hemorrhage, mass effect, midline shift or acute large vascular territory infarct. Moderate to severe stable ventriculomegaly on the basis of global parenchymal brain volume loss. No abnormal extra-axial fluid collections. Basal cisterns are patent. VASCULAR: Moderate calcific atherosclerosis of the carotid siphons. SKULL: No skull fracture. No significant scalp soft tissue swelling. SINUSES/ORBITS: The mastoid air-cells and included paranasal sinuses are well-aerated.Status post LEFT ocular lens implant. The included ocular globes and orbital contents are non-suspicious. OTHER: None. ASPECTS [facility] Stroke Program Early CT Score) - Ganglionic level infarction (caudate, lentiform nuclei, internal capsule, insula, M1-M3 cortex): 7 - Supraganglionic infarction (M4-M6 cortex): 3 Total score (0-10 with 10 being normal): 10 IMPRESSION: 1. No acute intracranial process. Stable examination including mild to moderate chronic small vessel ischemic disease and old small RIGHT cerebellar infarct. 2. ASPECTS is 10. Critical Value/emergent results were called by telephone at the time of interpretation on 06/04/2016 at 6:57 pm to Dr. Samuel Jester , who verbally acknowledged these results. Electronically Signed   By: Awilda Metro M.D.   On: 06/04/2016 18:59   Discharge Instructions    Amb Referral to Cardiac Rehabilitation    Complete by:  As directed     Diagnosis:  CABG   CABG X ___:  4      Discharge Medications: Allergies as of 06/15/2016      Reactions   Other Hives   Mycins-  Pt has Glaucoma in Right eye, Blind in Left eye .... PLEASE DO NOT GIVE ANYTHING TO PATIENT THAT MIGHT DESTROY VISION    Prednisone Other (See Comments)   Altered mental status   Statins Other (See Comments)   MYALGIAS Weakness, muscle aches, and pain   Erythromycin    UNSPECIFIED REACTION       Medication List    STOP taking these medications   lisinopril-hydrochlorothiazide 10-12.5 MG tablet Commonly known as:  PRINZIDE,ZESTORETIC   oxyCODONE-acetaminophen 5-325 MG tablet Commonly known as:  ROXICET     TAKE these medications   acetaminophen 325 MG tablet Commonly known as:  TYLENOL Take 2 tablets (650 mg total) by mouth every 6 (six) hours as needed for mild pain.   ALPRAZolam 0.25 MG tablet Commonly known as:  XANAX Take 0.125 mg by mouth 2 (two) times daily. What changed:  Another medication with the same name was added. Make sure you understand how and when to take each.   ALPRAZolam 0.25 MG tablet Commonly known as:  XANAX Take 0.5 tablets (0.125 mg total) by mouth 3 (three) times daily as needed for anxiety. What changed:  You were already taking a medication with the same name, and this prescription was added. Make sure you understand how and when to take each.   aspirin 325 MG EC tablet Take 1 tablet (325 mg total) by mouth daily. What changed:  medication strength  how much to take  when to take this   furosemide 40 MG tablet Commonly known as:  LASIX Take 1 tablet (40 mg total) by mouth daily.   lisinopril 2.5 MG tablet Commonly known as:  PRINIVIL,ZESTRIL Take 1 tablet (2.5 mg total) by mouth daily.   metoprolol tartrate 25 MG tablet Commonly known as:  LOPRESSOR Take 1 tablet (25 mg total) by mouth 2 (two) times daily.   NEXIUM 24HR 20 MG capsule Generic drug:  esomeprazole Take 20 mg by mouth at bedtime.     polyethylene glycol packet Commonly known as:  MIRALAX Take 17 g by mouth daily.   potassium chloride SA 20 MEQ tablet Commonly known as:  K-DUR,KLOR-CON Take 1 tablet (20 mEq total) by mouth daily.   timolol 0.5 % ophthalmic solution Commonly known as:  BETIMOL Place 1 drop into the right eye 2 (two) times daily.   traMADol 50 MG tablet Commonly known as:  ULTRAM Take 1 tablet (50 mg total) by mouth every 6 (six) hours as needed for moderate pain.   vitamin B-12 1000 MCG tablet Commonly known as:  CYANOCOBALAMIN Take 1,000 mcg by mouth at bedtime.      The patient has been discharged on:   1.Beta Blocker:  Yes [  x ]                              No   [   ]                              If No, reason:  2.Ace Inhibitor/ARB: Yes [ x  ]                                     No  [    ]                                     If No, reason:  3.Statin:   Yes [   ]                  No  [ x  ]                  If No, reason:Allergy  4.Marlowe Kays:  Yes  [ x  ]                  No   [   ]                  If No, reason:  Follow Up Appointments: Follow-up Information    Delight Ovens, MD Follow up on 07/17/2016.   Specialty:  Cardiothoracic Surgery Why:  PA/LAT CXR to be taken (at [address] [facility] which is in the same building as Dr. Dennie Maizes office) on 07/17/2016 at 12:30 pm;Appointment time is at 1:00 pm Contact information: [address] [place] [address] [phone]        Joni Reining, NP Follow up on 06/30/2016.   Specialties:  Nurse Practitioner, Radiology, Cardiology Why:  Appointment time is at 2:10 pm Contact information: [address] [place] [address] [phone]  SignedMarrion Coy 06/15/2016, 8:04 AM

## 2016-06-13 ENCOUNTER — Inpatient Hospital Stay (HOSPITAL_COMMUNITY): Payer: Medicare HMO

## 2016-06-13 LAB — BASIC METABOLIC PANEL
Anion gap: 8 (ref 5–15)
BUN: 9 mg/dL (ref 6–20)
CO2: 29 mmol/L (ref 22–32)
Calcium: 8.6 mg/dL — ABNORMAL LOW (ref 8.9–10.3)
Chloride: 102 mmol/L (ref 101–111)
Creatinine, Ser: 0.78 mg/dL (ref 0.44–1.00)
GFR calc Af Amer: >60 mL/min (ref >60)
GFR calc non Af Amer: >60 mL/min (ref >60)
Glucose, Bld: 105 mg/dL — ABNORMAL HIGH (ref 65–99)
Potassium: 3.5 mmol/L (ref 3.5–5.1)
Sodium: 139 mmol/L (ref 135–145)

## 2016-06-13 LAB — CBC
HCT: 25.5 % — ABNORMAL LOW (ref 36.0–46.0)
Hemoglobin: 8.3 g/dL — ABNORMAL LOW (ref 12.0–15.0)
MCH: 30.3 pg (ref 26.0–34.0)
MCHC: 32.5 g/dL (ref 30.0–36.0)
MCV: 93.1 fL (ref 78.0–100.0)
Platelets: 197 10*3/uL (ref 150–400)
RBC: 2.74 MIL/uL — ABNORMAL LOW (ref 3.87–5.11)
RDW: 14.7 % (ref 11.5–15.5)
WBC: 9.9 10*3/uL (ref 4.0–10.5)

## 2016-06-13 LAB — GLUCOSE, CAPILLARY
GLUCOSE-CAPILLARY: 105 mg/dL — AB (ref 65–99)
GLUCOSE-CAPILLARY: 116 mg/dL — AB (ref 65–99)
Glucose-Capillary: 123 mg/dL — ABNORMAL HIGH (ref 65–99)
Glucose-Capillary: 103 mg/dL — ABNORMAL HIGH (ref 65–99)

## 2016-06-13 MED ORDER — POTASSIUM CHLORIDE CRYS ER 20 MEQ PO TBCR
50.0000 meq | EXTENDED_RELEASE_TABLET | Freq: Once | ORAL | Status: AC
  Administered 2016-06-13: 50 meq via ORAL
  Filled 2016-06-13: qty 2

## 2016-06-13 MED ORDER — FUROSEMIDE 10 MG/ML IJ SOLN
40.0000 mg | Freq: Once | INTRAMUSCULAR | Status: AC
  Administered 2016-06-13: 40 mg via INTRAVENOUS
  Filled 2016-06-13: qty 4

## 2016-06-13 MED ORDER — LACTULOSE 10 GM/15ML PO SOLN
20.0000 g | Freq: Once | ORAL | Status: AC
  Administered 2016-06-13: 20 g via ORAL
  Filled 2016-06-13: qty 30

## 2016-06-13 MED ORDER — DIPHENHYDRAMINE HCL 25 MG PO CAPS: 25.0000 mg | ORAL_CAPSULE | Freq: Every evening | ORAL | Status: DC | PRN

## 2016-06-13 MED ORDER — OXYCODONE HCL 5 MG PO TABS: 5.0000 mg | ORAL_TABLET | ORAL | Status: DC | PRN

## 2016-06-13 MED ORDER — LISINOPRIL 2.5 MG PO TABS
2.5000 mg | ORAL_TABLET | Freq: Every day | ORAL | Status: DC
  Administered 2016-06-13 – 2016-06-15 (×3): 2.5 mg via ORAL
  Filled 2016-06-13 (×3): qty 1

## 2016-06-13 MED ORDER — POTASSIUM CHLORIDE CRYS ER 20 MEQ PO TBCR
20.0000 meq | EXTENDED_RELEASE_TABLET | Freq: Every day | ORAL | Status: DC
  Administered 2016-06-13 – 2016-06-14 (×2): 20 meq via ORAL
  Filled 2016-06-13 (×2): qty 1

## 2016-06-13 NOTE — Progress Notes (Signed)
Physical Therapy Treatment Patient Details Name: Sheryl Carter MRN: [id number] DOB: 08/27/1940 Today's Date: 06/13/2016    History of Present Illness Pt is a 76 y.o. female who [facility] to [facility] with suspected TIA; recent left carotid endarterectomy on 05/29/2016. 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 Elevated troponin upon admission determined to be NSTEMI. 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 Pertinent PMH includes TIA, HTN, glaucoma, DVT, anxiety, L-eye blindness.    PT Comments    Pt very pleasant and moving well with increased ability with all transfers and gait. Pt with sats 92-96% on RA and HR 93-117. Pt educated for all precautions as only able to recall 2 beginning of session. Pt with improved posture with youth walker today and states her's does not have wheels. Will continue to follow and progress mobility and continue to recommend SNF. Encouraged ambulation with nursing and HEP.   BP 151/80 end of session   Follow Up Recommendations  SNF     Equipment Recommendations       Recommendations for Other Services       Precautions / Restrictions Precautions Precautions: Sternal;Fall Precaution Comments: pt able to recall 2/5 Restrictions Weight Bearing Restrictions: Yes (sternal precautions)    Mobility  Bed Mobility Overal bed mobility: Needs Assistance Bed Mobility: Sit to Supine       Sit to supine: Min assist   General bed mobility comments: assist for bil LE onto bed with cues for sequence, pt also limited by her stature  Transfers Overall transfer level: Needs assistance   Transfers: Sit to/from Stand Sit to Stand: Min guard         General transfer comment: cues for hand placement, sequence and safety from chair and toilet  Ambulation/Gait Ambulation/Gait assistance: Min guard Ambulation Distance (Feet): 300 Feet Assistive device: Rolling walker (2 wheeled) Gait Pattern/deviations:  Step-through pattern;Trunk flexed   Gait velocity interpretation: Below normal speed for age/gender General Gait Details: cues for posture and position in safety with decreased speed. Pt requested chair follow but didn't need it   Stairs            Wheelchair Mobility    Modified Rankin (Stroke Patients Only)       Balance Overall balance assessment: Needs assistance   Sitting balance-Leahy Scale: Good       Standing balance-Leahy Scale: Fair                      Cognition Arousal/Alertness: Awake/alert Behavior During Therapy: WFL for tasks assessed/performed Overall Cognitive Status: Impaired/Different from baseline       Memory: Decreased recall of precautions              Exercises General Exercises - Lower Extremity Heel Slides: AROM;Both;Supine;10 reps Hip ABduction/ADduction: AROM;Both;Supine;10 reps    General Comments        Pertinent Vitals/Pain Pain Assessment: No/denies pain    Home Living                      Prior Function            PT Goals (current goals can now be found in the care plan section) Progress towards PT goals: Progressing toward goals    Frequency           PT Plan Current plan remains appropriate    Co-evaluation  End of Session Equipment Utilized During Treatment: Gait belt Activity Tolerance: Patient tolerated treatment well Patient left: in bed;with call bell/phone within reach Nurse Communication: Mobility status PT Visit Diagnosis: Difficulty in walking, not elsewhere classified (R26.2);Muscle weakness (generalized) (M62.81)     Time: 1610-9604 PT Time Calculation (min) (ACUTE ONLY): 30 min  Charges:  $Gait Training: 8-22 mins $Therapeutic Activity: 8-22 mins                    G Codes:       Sheryl Carter Jul 03, 2016, 9:36 AM Delaney Meigs, PT [phone]

## 2016-06-13 NOTE — Progress Notes (Signed)
CARDIAC REHAB PHASE I   PRE:  Rate/Rhythm: 82 SR    BP: sitting 141/81    SaO2: 97 RA  MODE:  Ambulation: 150 ft   POST:  Rate/Rhythm: 101 ST    BP: sitting 98/77     SaO2: 97 RA  [facility]. Sts she is tired. Willing to walk but decreased distance from her walk with PT this am. Steady with RW, slow pace. x2 standing rest stops. Sts she felt really tired/washed out toward end of walk. BP much lower. Denied dizziness. To [facility] then recliner for lunch. Will f/u. Encouraged x1 more walk. [id number]   Harriet MassonRandi Kristan Jacorian Golaszewski CES, ACSM 06/13/2016 12:00 PM

## 2016-06-13 NOTE — Care Management Important Message (Signed)
Important Message  Patient Details  Name: Sheryl Carter MRN: [id number] Date of Birth: 04/16/1940   Medicare Important Message Given:  Yes    Kyla BalzarineShealy, Arnold Kester Abena 06/13/2016, 12:35 PM

## 2016-06-13 NOTE — Progress Notes (Signed)
PT Cancellation Note  Patient Details Name: Loleta ChanceShirley M Manfredi MRN: [id number] DOB: 01/27/1941   Cancelled Treatment:    Reason Eval/Treat Not Completed: Patient at procedure or test/unavailable (pt OOR for procedure, will plan to see upon return)   Deena Shaub B Shallyn Constancio 06/13/2016, 7:35 AM  Delaney MeigsMaija Tabor Nakota Ackert, PT [phone]

## 2016-06-13 NOTE — Progress Notes (Addendum)
[address]       Sheryl Carter [address]             [phone]        4 Days Post-Op Procedure(s) (LRB): CORONARY ARTERY BYPASS GRAFTING (CABG) times 4 using the left internal mammary artery and bilateral thigh greater saphenous veins harvested endoscopically. LIMA to LAD, SVG to DIAGONAL 2, SVG to OM, SVG to RCA (N/A) TRANSESOPHAGEAL ECHOCARDIOGRAM (TEE) (N/A)  Subjective: Patient with complaints of not sleeping and "hard to breathe". No bowel movement yet.  Objective: Vital signs in last 24 hours: Temp:  [98.1 F (36.7 C)-98.4 F (36.9 C)] 98.4 F (36.9 C) (03/19 2052) Pulse Rate:  [75-89] 89 (03/19 2052) Cardiac Rhythm: Normal sinus rhythm (03/19 1930) Resp:  [16-19] 16 (03/19 2052) BP: (132-147)/(73-81) 147/81 (03/19 2052) SpO2:  [92 %-100 %] 100 % (03/19 2052) Weight:  [89.8 kg (197 lb 15.6 oz)] 89.8 kg (197 lb 15.6 oz) (03/20 0500)  Pre op weight 83 kg Current Weight  06/13/16 89.8 kg (197 lb 15.6 oz)   Intake/Output from previous day: 03/19 0701 - 03/20 0700 In: 3 [I.V.:3] Out: 252 [Urine:251; Stool:1]   Physical Exam:  Cardiovascular: RRR Pulmonary: Diminished at bases Abdomen: Soft, non tender, bowel sounds present. Extremities: Bilateral lower extremity edema. Wounds: Clean and dry.  No erythema or signs of infection.  Lab Results: CBC: Recent Labs  06/12/16 0400 06/13/16 0252  WBC 11.2* 9.9  HGB 8.1* 8.3*  HCT 24.0* 25.5*  PLT 153 197   BMET:  Recent Labs  06/12/16 0400 06/13/16 0252  NA 139 139  K 3.3* 3.5  CL 104 102  CO2 28 29  GLUCOSE 94 105*  BUN 14 9  CREATININE 0.91 0.78  CALCIUM 8.3* 8.6*    PT/INR:  Lab Results  Component Value Date   INR 1.51 06/09/2016   INR 1.02 06/07/2016   INR 0.94 06/04/2016   ABG:  INR: Will add last result for INR, ABG once components are confirmed Will add last 4 CBG results once components are confirmed  Assessment/Plan:  1. CV - S/p NSTEMI. SR in the 80's. On  Lopressor 12.5 mg bid. Will start low dose ACE for better BP control.  2.  Pulmonary - On  2 liters of oxygen via [place]. Wean to room air as tolerates.CXR ordered but not taken yet. Encourage incentive spirometer 3. Volume Overload - Will give Lasix 40 mg IV today. 4.  Acute blood loss anemia - H and H stable at 8.3 and 25.5 5. Supplement potassium 6. Remove EPW 7. CBGs 138/115/105. Pre op HGA1C 5.41. Stop accu checks and SS PRN 8. Benadryl for sleep 9. LOC constipation 10. Will need SNF when ready for discharge   ZIMMERMAN,DONIELLE MPA-C 06/13/2016,7:16 AM  Dg Chest 2 View  Result Date: 06/13/2016 CLINICAL DATA:  76 year old female with history of shortness of breath and weakness. Postoperative evaluation. EXAM: CHEST  2 VIEW COMPARISON:  Chest x-ray 06/12/2016. FINDINGS: Previously noted right IJ central venous catheter has been removed. Lung volumes remain low. No consolidative airspace disease. Small bilateral pleural effusions. No evidence of pulmonary edema. Heart size is borderline enlarged. The patient is rotated to the right on today's exam, resulting in distortion of the mediastinal contours and reduced diagnostic sensitivity and specificity for mediastinal pathology. Atherosclerosis in the thoracic aorta. Status post median sternotomy for CABG. IMPRESSION: 1. Postoperative changes and support apparatus, as above. 2. Low lung volumes with small bilateral  pleural effusions. Electronically Signed   By: Trudie Reedaniel  Entrikin M.D.   On: 06/13/2016 08:29   Walked twice around floor this am Plan [facility] soon Now off o2  I have seen and examined Sheryl ChanceShirley M Carter and agree with the above assessment  and plan.  Delight OvensEdward B Letetia Romanello MD Beeper [phone] Office [phone] 06/13/2016 10:17 AM

## 2016-06-13 NOTE — Consult Note (Signed)
   [facility] CM Inpatient Consult   06/13/2016  Sheryl Carter 08/10/1940 [id number]     Patient screened for potential [facility] services. Chart reviewed. Noted current therapy recommendation are  for  SNF. Spoke with inpatient RNCM to confirm.  There are no identifiable [facility] Care [facility] needs at this time. If patient's post hospital needs change, please place a [facility] consult. For questions please contact:  Raiford Nobletika Hall, MSN-Ed, RN,BSN [facility] Care [facility] Hospital Liaison [phone]

## 2016-06-14 LAB — GLUCOSE, CAPILLARY
GLUCOSE-CAPILLARY: 108 mg/dL — AB (ref 65–99)
Glucose-Capillary: 115 mg/dL — ABNORMAL HIGH (ref 65–99)

## 2016-06-14 MED ORDER — FUROSEMIDE 10 MG/ML IJ SOLN
40.0000 mg | Freq: Once | INTRAMUSCULAR | Status: AC
  Administered 2016-06-14: 40 mg via INTRAVENOUS
  Filled 2016-06-14: qty 4

## 2016-06-14 MED ORDER — POTASSIUM CHLORIDE CRYS ER 20 MEQ PO TBCR
40.0000 meq | EXTENDED_RELEASE_TABLET | Freq: Every day | ORAL | Status: DC
  Administered 2016-06-14 – 2016-06-15 (×2): 40 meq via ORAL
  Filled 2016-06-14 (×2): qty 2

## 2016-06-14 MED ORDER — METOPROLOL TARTRATE 25 MG PO TABS
25.0000 mg | ORAL_TABLET | Freq: Two times a day (BID) | ORAL | Status: DC
  Administered 2016-06-14 – 2016-06-15 (×2): 25 mg via ORAL
  Filled 2016-06-14 (×2): qty 1

## 2016-06-14 MED ORDER — LACTULOSE 10 GM/15ML PO SOLN
20.0000 g | Freq: Once | ORAL | Status: AC
  Administered 2016-06-14: 20 g via ORAL
  Filled 2016-06-14: qty 30

## 2016-06-14 NOTE — Progress Notes (Signed)
CARDIAC REHAB PHASE I   PRE:  Rate/Rhythm: 80 SR  BP:  Supine:   Sitting: 132/70   Standing:    SaO2: 95% RA  MODE:  Ambulation: 240 ft   POST:  Rate/Rhythm: 84 SR  BP:  Supine:   Sitting: 122/66  Standing:    SaO2: 95% RA Very pleasant lady who tolerated ambulation well with rolling walker and assistance x 1.  For SNF placement tomorrow, discharge instructions completed re: sternal precautions, incision care, activity and exercise progression, and healthy nutrition tips.  Referred [facility] after discharged from SNF. [id number]  Cindra EvesBehrens, Talisa Petrak Adele RN, BSN 06/14/2016 11:38 AM

## 2016-06-14 NOTE — Clinical Social Work Placement (Signed)
   CLINICAL SOCIAL WORK PLACEMENT  NOTE  Date:  06/14/2016  Patient Details  Name: Sheryl Carter MRN: [id number] Date of Birth: 01/03/1941  Clinical Social Work is seeking post-discharge placement for this patient at the   level of care (*CSW will initial, date and re-position this form in  chart as items are completed):      Patient/family provided with [facility] Clinical Social Work Department's list of facilities offering this level of care within the geographic area requested by the patient (or if unable, by the patient's family).      Patient/family informed of their freedom to choose among providers that offer the needed level of care, that participate in Medicare, Medicaid or managed care program needed by the patient, have an available bed and are willing to accept the patient.      Patient/family informed of [facility]'s ownership interest in [facility] and [facility], as well as of the fact that they are under no obligation to receive care at these facilities.  PASRR submitted to EDS on       PASRR number received on       Existing PASRR number confirmed on       FL2 transmitted to all facilities in geographic area requested by pt/family on       FL2 transmitted to all facilities within larger geographic area on       Patient informed that his/her managed care company has contracts with or will negotiate with certain facilities, including the following:            Patient/family informed of bed offers received.  Patient chooses bed at       Physician recommends and patient chooses bed at      Patient to be transferred to   on  .  Patient to be transferred to facility by       Patient family notified on   of transfer.  Name of family member notified:        PHYSICIAN       Additional Comment:    _______________________________________________ Althea CharonAshley C Ozie Dimaria, LCSW 06/14/2016, 11:02 AM

## 2016-06-14 NOTE — Clinical Social Work Placement (Signed)
   CLINICAL SOCIAL WORK PLACEMENT  NOTE  Date:  06/14/2016  Patient Details  Name: Loleta ChanceShirley M Varnell MRN: [id number] Date of Birth: 05/18/1940  Clinical Social Work is seeking post-discharge placement for this patient at the Skilled  Nursing Facility level of care (*CSW will initial, date and re-position this form in  chart as items are completed):  Yes   Patient/family provided with [facility] Clinical Social Work Department's list of facilities offering this level of care within the geographic area requested by the patient (or if unable, by the patient's family).  Yes   Patient/family informed of their freedom to choose among providers that offer the needed level of care, that participate in Medicare, Medicaid or managed care program needed by the patient, have an available bed and are willing to accept the patient.  Yes   Patient/family informed of [facility]'s ownership interest in [facility] and [facility], as well as of the fact that they are under no obligation to receive care at these facilities.  PASRR submitted to EDS on       PASRR number received on       Existing PASRR number confirmed on       FL2 transmitted to all facilities in geographic area requested by pt/family on       FL2 transmitted to all facilities within larger geographic area on       Patient informed that his/her managed care company has contracts with or will negotiate with certain facilities, including the following:            Patient/family informed of bed offers received.  Patient chooses bed at       Physician recommends and patient chooses bed at      Patient to be transferred to   on  .  Patient to be transferred to facility by       Patient family notified on   of transfer.  Name of family member notified:        PHYSICIAN Please sign FL2     Additional Comment:    _______________________________________________ Althea CharonAshley C Ernestine Rohman, LCSW 06/14/2016, 11:03 AM

## 2016-06-14 NOTE — Progress Notes (Addendum)
      [address]       [place] [id number]             [phone]      5 Days Post-Op Procedure(s) (LRB): CORONARY ARTERY BYPASS GRAFTING (CABG) times 4 using the left internal mammary artery and bilateral thigh greater saphenous veins harvested endoscopically. LIMA to LAD, SVG to DIAGONAL 2, SVG to OM, SVG to RCA (N/A) TRANSESOPHAGEAL ECHOCARDIOGRAM (TEE) (N/A)   Subjective:  [name] is doing well.  She has not moved her bowels despite lactulose yesterday.   She is ambulating with assistance.  Objective: Vital signs in last 24 hours: Temp:  [98 F (36.7 C)-98.3 F (36.8 C)] 98 F (36.7 C) (03/21 0655) Pulse Rate:  [90-117] 100 (03/21 0835) Cardiac Rhythm: Normal sinus rhythm (03/21 0700) Resp:  [18] 18 (03/21 0655) BP: (115-151)/(67-80) 115/69 (03/21 0835) SpO2:  [96 %-97 %] 97 % (03/21 0655) Weight:  [191 lb 8 oz (86.9 kg)] 191 lb 8 oz (86.9 kg) (03/21 0655)  Intake/Output from previous day: 03/20 0701 - 03/21 0700 In: 480 [P.O.:480] Out: 2150 [Urine:2150]  General appearance: alert, cooperative and no distress Heart: regular rate and rhythm Lungs: clear to auscultation bilaterally Abdomen: soft, non-tender; bowel sounds normal; no masses,  no organomegaly Extremities: edema 2-3+ Wound: clean and dry  Lab Results:  Recent Labs  06/12/16 0400 06/13/16 0252  WBC 11.2* 9.9  HGB 8.1* 8.3*  HCT 24.0* 25.5*  PLT 153 197   BMET:  Recent Labs  06/12/16 0400 06/13/16 0252  NA 139 139  K 3.3* 3.5  CL 104 102  CO2 28 29  GLUCOSE 94 105*  BUN 14 9  CREATININE 0.91 0.78  CALCIUM 8.3* 8.6*    PT/INR: No results for input(s): LABPROT, INR in the last 72 hours. ABG    Component Value Date/Time   PHART 7.320 (L) 06/09/2016 2312   HCO3 22.9 06/09/2016 2312   TCO2 23 06/10/2016 1834   ACIDBASEDEF 3.0 (H) 06/09/2016 2312   O2SAT 94.0 06/09/2016 2312   CBG (last 3)   Recent Labs  06/13/16 1621 06/13/16 2109 06/14/16 0651  GLUCAP  103* 116* 108*    Assessment/Plan: S/P Procedure(s) (LRB): CORONARY ARTERY BYPASS GRAFTING (CABG) times 4 using the left internal mammary artery and bilateral thigh greater saphenous veins harvested endoscopically. LIMA to LAD, SVG to DIAGONAL 2, SVG to OM, SVG to RCA (N/A) TRANSESOPHAGEAL ECHOCARDIOGRAM (TEE) (N/A)  1. CV- S/P NSTEMI, mild Sinus Tach, BP remains high at times- continue Lopressor at 25 mg BID, Lisinopril 2. Pulm- no acute issues, continue IS 3. Renal- creatinine WNL, remains hypervolemic, will repeat IV Lasix today 4. Hypokalemia- continue potassium supplementation 5. LOC constipation- will repeat Lactulose, will try prune juice this morning 6. Dispo- patient stable, repeat IV lasix, increase Lopressor for better HR control, possibly for SNF tomorrow   LOS: 7 days    Lowella DandyBARRETT, ERIN 06/14/2016 slowly progressing [facility], plan tomorrow  I have seen and examined Sheryl Carter and agree with the above assessment  and plan.  Delight OvensEdward B Shakora Nordquist MD Beeper [phone] Office [phone] 06/14/2016 10:32 AM

## 2016-06-14 NOTE — Clinical Social Work Note (Signed)
Clinical Social Work Assessment  Patient Details  Name: Sheryl Carter MRN: [id number] Date of Birth: 1940-05-14  Date of referral:  06/14/16               Reason for consult:  Discharge Planning                Permission sought to share information with:  Family Supports Permission granted to share information::  Yes, Verbal Permission Granted  Name::     [name]  Agency::     Relationship::  daughter  Contact Information:  [phone]  Housing/Transportation Living arrangements for the past 2 months:  [place] of Information:  Patient Patient Interpreter Needed:  None Criminal Activity/Legal Involvement Pertinent to Current Situation/Hospitalization:  No - Comment as needed Significant Relationships:  None Lives with:  Self Do you feel safe going back to the place where you live?  Yes Need for family participation in patient care:  Yes (Comment)  Care giving concerns:  Patient lives alone  Social Worker assessment / plan:  Clinical Social Worker met patient at bedside to offer support and discuss discharge needs. Patient stated her children are support but children are unable to assist with 24/hr care since they are all working. Patient is agreeable to SNF placement and would prefer [facility], since facility is close to family support. CSW to complete necessary paperwork and initiate SNF search on patient behalf. CSW to follow up with patient once bed offers are available.   Employment status:  Retired Forensic scientist:  Medicare PT Recommendations:  [place] / Referral to community resources:  [place]  Patient/Family's Response to care:  Patient verbalized appreciation and understanding for CSW role and involvement in care. Patient agreeable with current discharge plan to SNF following discharge.  Patient/Family's Understanding of and Emotional Response to Diagnosis, Current Treatment, and  Prognosis:  Patient with good understanding of current medical state and limitations around most recent hospitalization. Patient agreeable with SNF placement in hopes of transitioning back home.   Emotional Assessment Appearance:  Appears stated age Attitude/Demeanor/Rapport:  Other (appropriate) Affect (typically observed):  Pleasant, Happy Orientation:  Oriented to Situation, Oriented to  Time, Oriented to Place, Oriented to Self Alcohol / Substance use:  Not Applicable Psych involvement (Current and /or in the community):  No (Comment)  Discharge Needs  Concerns to be addressed:  No discharge needs identified Readmission within the last 30 days:  No Current discharge risk:  None Barriers to Discharge:  No Barriers Identified   Wende Neighbors, LCSW 06/14/2016, 10:48 AM

## 2016-06-15 ENCOUNTER — Inpatient Hospital Stay
Admission: RE | Admit: 2016-06-15 | Discharge: 2016-06-30 | Disposition: A | Discharge status: Home / Self Care | Payer: Medicare HMO | Source: Ambulatory Visit | Attending: Internal Medicine | Admitting: Internal Medicine

## 2016-06-15 DIAGNOSIS — I2510 Atherosclerotic heart disease of native coronary artery without angina pectoris: Secondary | ICD-10-CM | POA: Diagnosis not present

## 2016-06-15 DIAGNOSIS — K649 Unspecified hemorrhoids: Secondary | ICD-10-CM | POA: Diagnosis not present

## 2016-06-15 DIAGNOSIS — E7800 Pure hypercholesterolemia, unspecified: Secondary | ICD-10-CM | POA: Diagnosis not present

## 2016-06-15 DIAGNOSIS — M6281 Muscle weakness (generalized): Secondary | ICD-10-CM | POA: Diagnosis not present

## 2016-06-15 DIAGNOSIS — G458 Other transient cerebral ischemic attacks and related syndromes: Secondary | ICD-10-CM | POA: Diagnosis not present

## 2016-06-15 DIAGNOSIS — Z951 Presence of aortocoronary bypass graft: Secondary | ICD-10-CM | POA: Diagnosis not present

## 2016-06-15 DIAGNOSIS — I6522 Occlusion and stenosis of left carotid artery: Secondary | ICD-10-CM | POA: Diagnosis not present

## 2016-06-15 DIAGNOSIS — K5901 Slow transit constipation: Secondary | ICD-10-CM | POA: Diagnosis not present

## 2016-06-15 DIAGNOSIS — F411 Generalized anxiety disorder: Secondary | ICD-10-CM | POA: Diagnosis not present

## 2016-06-15 DIAGNOSIS — I10 Essential (primary) hypertension: Secondary | ICD-10-CM | POA: Diagnosis not present

## 2016-06-15 DIAGNOSIS — I25110 Atherosclerotic heart disease of native coronary artery with unstable angina pectoris: Secondary | ICD-10-CM | POA: Diagnosis not present

## 2016-06-15 DIAGNOSIS — G459 Transient cerebral ischemic attack, unspecified: Secondary | ICD-10-CM | POA: Diagnosis not present

## 2016-06-15 DIAGNOSIS — F419 Anxiety disorder, unspecified: Secondary | ICD-10-CM | POA: Diagnosis not present

## 2016-06-15 DIAGNOSIS — R0989 Other specified symptoms and signs involving the circulatory and respiratory systems: Secondary | ICD-10-CM | POA: Diagnosis not present

## 2016-06-15 DIAGNOSIS — I214 Non-ST elevation (NSTEMI) myocardial infarction: Secondary | ICD-10-CM | POA: Diagnosis not present

## 2016-06-15 DIAGNOSIS — I25119 Atherosclerotic heart disease of native coronary artery with unspecified angina pectoris: Secondary | ICD-10-CM | POA: Diagnosis not present

## 2016-06-15 DIAGNOSIS — E785 Hyperlipidemia, unspecified: Secondary | ICD-10-CM | POA: Diagnosis not present

## 2016-06-15 DIAGNOSIS — R278 Other lack of coordination: Secondary | ICD-10-CM | POA: Diagnosis not present

## 2016-06-15 DIAGNOSIS — J00 Acute nasopharyngitis [common cold]: Secondary | ICD-10-CM | POA: Diagnosis not present

## 2016-06-15 DIAGNOSIS — E784 Other hyperlipidemia: Secondary | ICD-10-CM | POA: Diagnosis not present

## 2016-06-15 DIAGNOSIS — R05 Cough: Secondary | ICD-10-CM | POA: Diagnosis not present

## 2016-06-15 DIAGNOSIS — J029 Acute pharyngitis, unspecified: Secondary | ICD-10-CM | POA: Diagnosis not present

## 2016-06-15 MED ORDER — LISINOPRIL 2.5 MG PO TABS
2.5000 mg | ORAL_TABLET | Freq: Every day | ORAL | Status: DC
Start: 2016-06-15 — End: 2016-06-30

## 2016-06-15 MED ORDER — TRAMADOL HCL 50 MG PO TABS: 50.0000 mg | ORAL_TABLET | Freq: Four times a day (QID) | ORAL | 0 refills | Status: DC | PRN

## 2016-06-15 MED ORDER — ALPRAZOLAM 0.25 MG PO TABS: 0.1250 mg | ORAL_TABLET | Freq: Three times a day (TID) | ORAL | 0 refills | Status: DC | PRN

## 2016-06-15 MED ORDER — ACETAMINOPHEN 325 MG PO TABS: 650.0000 mg | ORAL_TABLET | Freq: Four times a day (QID) | ORAL | Status: DC | PRN

## 2016-06-15 MED ORDER — POLYETHYLENE GLYCOL 3350 17 G PO PACK
17.0000 g | PACK | Freq: Every day | ORAL | 0 refills | Status: DC
Start: 2016-06-15 — End: 2017-03-28

## 2016-06-15 MED ORDER — POTASSIUM CHLORIDE CRYS ER 20 MEQ PO TBCR: 20.0000 meq | EXTENDED_RELEASE_TABLET | Freq: Every day | ORAL | Status: DC

## 2016-06-15 MED ORDER — FUROSEMIDE 40 MG PO TABS: 40.0000 mg | ORAL_TABLET | Freq: Every day | ORAL | Status: DC

## 2016-06-15 MED ORDER — ASPIRIN 325 MG PO TBEC: 325.0000 mg | DELAYED_RELEASE_TABLET | Freq: Every day | ORAL | 0 refills | Status: DC

## 2016-06-15 MED ORDER — METOPROLOL TARTRATE 25 MG PO TABS
25.0000 mg | ORAL_TABLET | Freq: Two times a day (BID) | ORAL | Status: DC
Start: 2016-06-15 — End: 2016-06-30

## 2016-06-15 NOTE — Progress Notes (Signed)
Physical Therapy Treatment Patient Details Name: Sheryl Carter MRN: [id number] DOB: 12-11-1940 Today's Date: 06/15/2016    History of Present Illness Pt is a 76 y.o. female who presented to [facility] with suspected TIA; recent left carotid endarterectomy on 05/29/2016. 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 Elevated troponin upon admission determined to be NSTEMI. 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 Pertinent PMH includes TIA, HTN, glaucoma, DVT, anxiety, L-eye blindness.    PT Comments    Pt presented supine in bed with HOB elevated, awake and willing to participate in therapy session. Pt continuing to make steady progress with functional mobility, ambulating with RW and min guard on RA with SPO2 maintaining >90% throughout. Pt's resting HR in low 80's with increase to 101 bpm with activity. PT reviewed all sternal precautions with pt at beginning and end of session. Pt would continue to benefit from skilled physical therapy services at this time while admitted and after d/c to address her limitations in order to improve her overall safety and independence with functional mobility.   BP at beginning of session = 128/73 BP at end of session = 143/69   Follow Up Recommendations  SNF     Equipment Recommendations  None recommended by PT    Recommendations for Other Services       Precautions / Restrictions Precautions Precautions: Sternal;Fall Precaution Comments: pt able to recall 2/5 at beginning of session, 3/5 at end Restrictions Weight Bearing Restrictions: Yes (sternal precautions)    Mobility  Bed Mobility Overal bed mobility: Needs Assistance Bed Mobility: Supine to Sit     Supine to sit: Min guard;HOB elevated     General bed mobility comments: increased time, use of bed rails, min guard for safety  Transfers Overall transfer level: Needs assistance Equipment used: Rolling walker (2 wheeled) Transfers: Sit  to/from Stand Sit to Stand: Min guard         General transfer comment: pt required VC'ing for bilateral hand placement to adhere to precautions with transfer from bed and from toilet, min guard for safety  Ambulation/Gait Ambulation/Gait assistance: Min guard Ambulation Distance (Feet): 150 Feet Assistive device: Rolling walker (2 wheeled) Gait Pattern/deviations: Step-through pattern;Trunk flexed Gait velocity: decreased Gait velocity interpretation: Below normal speed for age/gender General Gait Details: pt demonstrated safety with use of RW, but required VC'ing for improved posture. Pt ambulated on RA with SPO2 maintaining >90% throughout.   Stairs            Wheelchair Mobility    Modified Rankin (Stroke Patients Only)       Balance Overall balance assessment: Needs assistance Sitting-balance support: Feet supported Sitting balance-Leahy Scale: Good     Standing balance support: During functional activity;No upper extremity supported Standing balance-Leahy Scale: Fair Standing balance comment: pt able to stand in bathroom and at sink without UE supports with min guard for safety                    Cognition Arousal/Alertness: Awake/alert Behavior During Therapy: WFL for tasks assessed/performed Overall Cognitive Status: Impaired/Different from baseline Area of Impairment: Memory     Memory: Decreased recall of precautions              Exercises      General Comments        Pertinent Vitals/Pain Pain Assessment: No/denies pain Pain Intervention(s): Monitored during session    Home Living  Prior Function            PT Goals (current goals can now be found in the care plan section) Acute Rehab PT Goals PT Goal Formulation: With patient Time For Goal Achievement: 06/24/16 Potential to Achieve Goals: Good Progress towards PT goals: Progressing toward goals    Frequency    Min 3X/week      PT  Plan Current plan remains appropriate    Co-evaluation             End of Session Equipment Utilized During Treatment: Gait belt Activity Tolerance: Patient tolerated treatment well Patient left: in chair;with call bell/phone within reach;Other (comment) (MD present) Nurse Communication: Mobility status PT Visit Diagnosis: Difficulty in walking, not elsewhere classified (R26.2);Muscle weakness (generalized) (M62.81)     Time: [id number] PT Time Calculation (min) (ACUTE ONLY): 22 min  Charges:  $Gait Training: 8-22 mins                    G Codes:       Alessandra BevelsJennifer M Damaya Channing 06/15/2016, 9:31 AM Deborah ChalkJennifer Suriah Peragine, PT, DPT [phone]

## 2016-06-15 NOTE — Progress Notes (Signed)
Chest tube sutures removed, per order. Benzoin and steri-strips applied. Pt tolerated well. Sites clean and dry. Telemetry box and IV removed for discharge.   Berdine DanceLauren Moffitt BSN, RN

## 2016-06-15 NOTE — Clinical Social Work Placement (Signed)
   CLINICAL SOCIAL WORK PLACEMENT  NOTE  Date:  06/15/2016  Patient Details  Name: Sheryl ChanceShirley M Engelbrecht MRN: [id number] Date of Birth: 06/05/1940  Clinical Social Work is seeking post-discharge placement for this patient at the Skilled  Nursing Facility level of care (*CSW will initial, date and re-position this form in  chart as items are completed):  Yes   Patient/family provided with [facility] Clinical Social Work Department's list of facilities offering this level of care within the geographic area requested by the patient (or if unable, by the patient's family).  Yes   Patient/family informed of their freedom to choose among providers that offer the needed level of care, that participate in Medicare, Medicaid or managed care program needed by the patient, have an available bed and are willing to accept the patient.  Yes   Patient/family informed of [facility]'s ownership interest in [facility] and [facility], as well as of the fact that they are under no obligation to receive care at these facilities.  PASRR submitted to EDS on       PASRR number received on       Existing PASRR number confirmed on 06/14/16     FL2 transmitted to all facilities in geographic area requested by pt/family on       FL2 transmitted to all facilities within larger geographic area on       Patient informed that his/her managed care company has contracts with or will negotiate with certain facilities, including the following:            Patient/family informed of bed offers received.  [facility]     Physician recommends and patient chooses bed at      [facility] on 06/15/16.  Patient to be transferred to facility by PTAR     Patient family notified on 06/15/16 of transfer.  Name of family member notified:  Park MeoGeneva Robertson     PHYSICIAN       Additional Comment:     _______________________________________________ Margarito LinerSarah C Oveda Dadamo, LCSW 06/15/2016, 4:00 PM

## 2016-06-15 NOTE — Care Management Note (Addendum)
Case Management Note Sheryl PieriniKristi Jon Kasparek RN, BSN Unit 2W-Case Manager [phone]  Patient Details  Name: Sheryl Carter MRN: [id number] Date of Birth: 08/02/1940  Subjective/Objective:  Pt s/p CABG recent CEA  - tx from 2S to 2W on 06/12/16                 Action/Plan: PTA pt lived at home- recommendations for SNF- CSW following for placement needs-   Expected Discharge Date:  06/15/16               Expected Discharge Plan:  Skilled Nursing Facility  In-House Referral:  Clinical Social Work  Discharge planning Services  CM Consult  Post Acute Care Choice:  NA Choice offered to:  NA  DME Arranged:    DME Agency:     HH Arranged:    HH Agency:     Status of Service:  Completed, signed off  If discussed at [employer] Length of Stay Meetings, dates discussed:  06/15/16  Discharge Disposition: skilled facility   Additional Comments:  06/15/16- 1600- Sheryl Craney rN, CM- per CSW pt has auth to go to SNF today- CSW following for placement needs. Pt to d/c today.   Darrold SpanWebster, Sheryl Carter Hall, RN 06/15/2016, 5:08 PM

## 2016-06-15 NOTE — Progress Notes (Signed)
CARDIAC REHAB PHASE I    Pt ambulated x2 today, declines ambulation with cardiac rehab at this time. Pt states she has no questions regarding education at this time. Phase 2 referral sent to . Pt in bed, call bell within reach, will follow up tomorrow if pt does not discharge today.  Joylene GrapesEmily C Jayren Cease, RN, BSN 06/15/2016 2:39 PM

## 2016-06-15 NOTE — Progress Notes (Addendum)
      [address]       [place] [id number]             [phone]      6 Days Post-Op Procedure(s) (LRB): CORONARY ARTERY BYPASS GRAFTING (CABG) times 4 using the left internal mammary artery and bilateral thigh greater saphenous veins harvested endoscopically. LIMA to LAD, SVG to DIAGONAL 2, SVG to OM, SVG to RCA (N/A) TRANSESOPHAGEAL ECHOCARDIOGRAM (TEE) (N/A)   Subjective:  No new complaints.  Feels a little tired this morning, but she just returned from a walk with PT.  Objective: Vital signs in last 24 hours: Temp:  [98.3 F (36.8 C)-98.9 F (37.2 C)] 98.3 F (36.8 C) (03/22 0424) Pulse Rate:  [86-100] 86 (03/22 0424) Cardiac Rhythm: Normal sinus rhythm (03/21 1900) Resp:  [20] 20 (03/22 0424) BP: (115-128)/(69-84) 126/84 (03/22 0424) SpO2:  [96 %-98 %] 96 % (03/22 0424) Weight:  [188 lb (85.3 kg)] 188 lb (85.3 kg) (03/22 0424)  Intake/Output from previous day: 03/21 0701 - 03/22 0700 In: 240 [P.O.:240] Out: 2100 [Urine:2100]  General appearance: alert, cooperative and no distress Heart: regular rate and rhythm Lungs: clear to auscultation bilaterally Abdomen: soft, non-tender; bowel sounds normal; no masses,  no organomegaly Extremities: edema 2-3+ Wound: clean and dry  Lab Results:  Recent Labs  06/13/16 0252  WBC 9.9  HGB 8.3*  HCT 25.5*  PLT 197   BMET:  Recent Labs  06/13/16 0252  NA 139  K 3.5  CL 102  CO2 29  GLUCOSE 105*  BUN 9  CREATININE 0.78  CALCIUM 8.6*    PT/INR: No results for input(s): LABPROT, INR in the last 72 hours. ABG    Component Value Date/Time   PHART 7.320 (L) 06/09/2016 2312   HCO3 22.9 06/09/2016 2312   TCO2 23 06/10/2016 1834   ACIDBASEDEF 3.0 (H) 06/09/2016 2312   O2SAT 94.0 06/09/2016 2312   CBG (last 3)   Recent Labs  06/13/16 2109 06/14/16 0651 06/14/16 1150  GLUCAP 116* 108* 115*    Assessment/Plan: S/P Procedure(s) (LRB): CORONARY ARTERY BYPASS GRAFTING (CABG) times 4 using  the left internal mammary artery and bilateral thigh greater saphenous veins harvested endoscopically. LIMA to LAD, SVG to DIAGONAL 2, SVG to OM, SVG to RCA (N/A) TRANSESOPHAGEAL ECHOCARDIOGRAM (TEE) (N/A)  1. CV- NSR, BP controlled- continue Lopressor, Lisinopril 2. Pulm- no acute issues, continue IS 3. Renal- creatinine has been okay, remains hypervolemic, but weight is trending down... Continue Lasix and potassium, add TED hose 4. Deconditioning- SNF placement has been arranged 5. Dispo- patient stable, will d/c to SNF today    LOS: 8 days    Sheryl Carter, Sheryl Carter 06/15/2016  To pen center today Wounds intact I have seen and examined Loleta ChanceShirley M Donegan and agree with the above assessment  and plan.  Delight OvensEdward B Arabella Revelle MD Beeper [phone] 06/15/2016 8:25 AM

## 2016-06-15 NOTE — Clinical Social Work Note (Signed)
CSW facilitated patient discharge including contacting patient family [place] [facility]) and facility to confirm patient discharge plans. Clinical information faxed to facility and family agreeable with plan. CSW arranged ambulance transport via [facility] to [facility]. RN to call report prior to discharge [phone]).  CSW will sign off for now as social work intervention is no longer needed. Please consult us again if new needs arise.  Charlynn CourtSarah Crysten Kaman, CSW [phone]

## 2016-06-15 NOTE — Progress Notes (Signed)
Occupational Therapy Treatment Patient Details Name: Sheryl Carter MRN: [id number] DOB: 11/14/1940 Today's Date: 06/15/2016    History of present illness Pt is a 76 y.o. female who presented to [facility] with suspected TIA; recent left carotid endarterectomy on 05/29/2016. 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 Elevated troponin upon admission determined to be NSTEMI. 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 Pertinent PMH includes TIA, HTN, glaucoma, DVT, anxiety, L-eye blindness.   OT comments  Pt able to perform toilet transfer x2 with good technique for sit to stand. Pt able to verbally recall 5/5 sternal precautions at start of session but needed min verbal cues to maintain with functional activities. D/c plan remains appropriate. Will continue to follow acutely.   Follow Up Recommendations  Supervision/Assistance - 24 hour;SNF    Equipment Recommendations  None recommended by OT    Recommendations for Other Services      Precautions / Restrictions Precautions Precautions: Sternal;Fall Precaution Comments: Pt able to recall 5/5 sternal precautions at beginning of session Restrictions Weight Bearing Restrictions: Yes (sternal precuations)       Mobility Bed Mobility               General bed mobility comments: Pt sitting EOB upon arrival  Transfers Overall transfer level: Needs assistance Equipment used: Rolling walker (2 wheeled) Transfers: Sit to/from Stand Sit to Stand: Supervision         General transfer comment: Good technique for multiple sit to stands    Balance Overall balance assessment: Needs assistance Sitting-balance support: Feet supported;No upper extremity supported Sitting balance-Leahy Scale: Good     Standing balance support: No upper extremity supported;During functional activity Standing balance-Leahy Scale: Good                     ADL Overall ADL's : Needs  assistance/impaired     Grooming: Supervision/safety;Standing;Wash/dry hands               Lower Body Dressing: Minimal assistance;Sit to/from stand   Toilet Transfer: Supervision/safety;Ambulation;Regular Toilet;RW Toilet Transfer Details (indicate cue type and reason): Pt able to maintain sternal precautions with toilet transfer x2 Toileting- Clothing Manipulation and Hygiene: Supervision/safety;Sit to/from stand Toileting - Clothing Manipulation Details (indicate cue type and reason): for peri care only     Functional mobility during ADLs: Supervision/safety;Rolling walker        Vision                     Perception     Praxis      Cognition   Behavior During Therapy: WFL for tasks assessed/performed Overall Cognitive Status: Within Functional Limits for tasks assessed                         Exercises     Shoulder Instructions       General Comments      Pertinent Vitals/ Pain       Pain Assessment: No/denies pain  Home Living                                          Prior Functioning/Environment              Frequency  Min 2X/week        Progress Toward Goals  OT  Goals(current goals can now be found in the care plan section)  Progress towards OT goals: Progressing toward goals  Acute Rehab OT Goals Patient Stated Goal: Return home and be independent OT Goal Formulation: With patient  Plan Discharge plan remains appropriate    Co-evaluation                 End of Session Equipment Utilized During Treatment: Rolling walker  OT Visit Diagnosis: Other abnormalities of gait and mobility (R26.89);Muscle weakness (generalized) (M62.81)   Activity Tolerance Patient tolerated treatment well   Patient Left in bed;with call bell/phone within reach;Other (comment) (sitting EOB)   Nurse Communication Mobility status        Time: 1610-9604 OT Time Calculation (min): 19 min  Charges: OT  General Charges $OT Visit: 1 Procedure OT Treatments $Self Care/Home Management : 8-22 mins  Amyre Segundo A. Brett Albino, M.S., OTR/L Pager: [phone]   Gaye Alken 06/15/2016, 2:39 PM

## 2016-06-15 NOTE — Progress Notes (Signed)
Pt has been discharged to [facility]. Pt left with all of her belongings.   Berdine DanceLauren Moffitt BSN, RN

## 2016-06-15 NOTE — Progress Notes (Signed)
[facility]. All questions were answered. Pt awaiting for [facility] to arrive for transport.  Berdine DanceLauren Moffitt BSN, RN

## 2016-06-15 NOTE — Clinical Social Work Note (Addendum)
Clinicals faxed to [facility] for review. Usually takes about 4 hours to receive authorization.  Charlynn CourtSarah Avian Konigsberg, CSW [phone]  2:50 pm Insurance authorization obtained: [id number]. CSW left voicemail for admissions coordinator at [facility]. Waiting on call back before asking MD to put in dc order. SNF already has discharge summary.  Charlynn CourtSarah Trinidee Schrag, CSW [phone]

## 2016-06-16 ENCOUNTER — Non-Acute Institutional Stay (SKILLED_NURSING_FACILITY): Payer: Medicare HMO | Admitting: Internal Medicine

## 2016-06-16 ENCOUNTER — Encounter: Payer: Self-pay | Admitting: Internal Medicine

## 2016-06-16 DIAGNOSIS — I214 Non-ST elevation (NSTEMI) myocardial infarction: Secondary | ICD-10-CM

## 2016-06-16 DIAGNOSIS — F419 Anxiety disorder, unspecified: Secondary | ICD-10-CM

## 2016-06-16 DIAGNOSIS — G458 Other transient cerebral ischemic attacks and related syndromes: Secondary | ICD-10-CM | POA: Diagnosis not present

## 2016-06-16 DIAGNOSIS — I10 Essential (primary) hypertension: Secondary | ICD-10-CM

## 2016-06-16 NOTE — Progress Notes (Signed)
Location:   [facility] Room Number: 109/D Place of Service:  SNF (31) Provider:  Angela Cox., MD  Patient Care Team: Elfredia Nevins, MD as PCP - General (Internal Medicine)  Extended Emergency Contact Information Primary Emergency Contact: [place] Amy, [place] [address] [place] Home Phone: [phone] Relation: Daughter Secondary Emergency Contact: Beezley,Curtis          [place], [place] [address] [place] Home Phone: [phone] Relation: Son  Code Status: Full Code Goals of care: Advanced Directive information Advanced Directives 06/16/2016  Does Patient Have a Medical Advance Directive? Yes  Type of Advance Directive (No Data)  Does patient want to make changes to medical advance directive? No - Patient declined  Copy of Healthcare Power of Attorney in Chart? -  Would patient like information on creating a medical advance directive? -     Chief Complaint  Patient presents with  . Acute Visit  Status post hospitalization for MI status post CABG 4  HPI:  Pt is a 76 y.o. female seen today for an acute visit for hospitalization for what was diagnosed as an MI..  She presented to the ER with an episode of slow speech.  CT scan did not show evidence of a CVA.  She was found to have an elevated troponin 3.32.  Cardiology was consulted and she was transferred to [facility] for evaluation and treatment.  She was on a cardiac catheterization to have severe multivessel coronary artery disease.  Echo showed reduced ejection fraction of 45%. Carotid Doppler did not show significant internal carotid artery stenosis.  Postoperative Lopressor was started and tried treated.  She also had volume overload and was diuresed.  She had acute blood loss anemia but did not require transfusion discharge hemoglobin was 8.3.  She does have a history of pre-diabetes A1c was stable at  5.4.  She was thought she would benefit from skilled nursing rehabilitation in that she has been admitted here.  Clinically she does not have any acute complaints says at times she does have shortness of breath.  Does not complain of chest pain.  Says she is going to the bathroom quite a bit since she has been started on the Lasix but understands the reason for the Lasix.      She subsequently underwent coronary artery bypass grafting.       Past Medical History:  Diagnosis Date  . Anxiety   . Blindness of left eye   . Complication of anesthesia    has gas trapped in her body after  . DVT (deep venous thrombosis) (HCC)   . Ear problems   . Family history of adverse reaction to anesthesia    daughter has gas trapped in her body postop as well  . Glaucoma   . Hypertension   . Pre-diabetes    pt. told by [facility] staff that she is prediabetic, HgbA1c was 5.4  . Stroke (HCC)    having TIA's   Past Surgical History:  Procedure Laterality Date  . BREAST SURGERY Left    cyst- L breast  . CHOLECYSTECTOMY    . CORONARY ARTERY BYPASS GRAFT N/A 06/09/2016   Procedure: CORONARY ARTERY BYPASS GRAFTING (CABG) times 4 using the left internal mammary artery and bilateral thigh greater saphenous veins harvested endoscopically. LIMA to LAD, SVG to DIAGONAL 2, SVG to OM, SVG to RCA;  Surgeon: Delight Ovens, MD;  Location: [facility] OR;  Service: Open Heart Surgery;  Laterality: N/A;  . ENDARTERECTOMY Left 05/29/2016   Procedure: LEFT CAROTID ENDARTERECTOMY WITH PATCH ANGIOPLASTY;  Surgeon: Larina Earthlyodd F Early, MD;  [facility];  Service: Vascular;  Laterality: Left;  . EYE SURGERY    . LEFT HEART CATH AND CORONARY ANGIOGRAPHY N/A 06/07/2016   Procedure: Left Heart Cath and Coronary Angiography;  Surgeon: Iran OuchMuhammad A Arida, MD;  Location: [facility] INVASIVE CV LAB;  Service: Cardiovascular;  Laterality: N/A;  . TEE WITHOUT CARDIOVERSION N/A 06/09/2016   Procedure: TRANSESOPHAGEAL ECHOCARDIOGRAM (TEE);   Surgeon: Delight OvensEdward B Gerhardt, MD;  [facility];  Service: Open Heart Surgery;  Laterality: N/A;    Allergies  Allergen Reactions  . Other Hives    Mycins-   Pt has Glaucoma in Right eye, Blind in Left eye .... PLEASE DO NOT GIVE ANYTHING TO PATIENT THAT MIGHT DESTROY VISION   . Prednisone Other (See Comments)    Altered mental status  . Statins Other (See Comments)    MYALGIAS Weakness, muscle aches, and pain  . Erythromycin     UNSPECIFIED REACTION     Allergies as of 06/16/2016      Reactions   Other Hives   Mycins-  Pt has Glaucoma in Right eye, Blind in Left eye .... PLEASE DO NOT GIVE ANYTHING TO PATIENT THAT MIGHT DESTROY VISION    Prednisone Other (See Comments)   Altered mental status   Statins Other (See Comments)   MYALGIAS Weakness, muscle aches, and pain   Erythromycin    UNSPECIFIED REACTION       Medication List       Accurate as of 06/16/16 10:26 AM. Always use your most recent med list.          ALPRAZolam 0.25 MG tablet Commonly known as:  XANAX Take 0.5 tablets (0.125 mg total) by mouth 3 (three) times daily as needed for anxiety.   aspirin 325 MG EC tablet Take 1 tablet (325 mg total) by mouth daily.   furosemide 40 MG tablet Commonly known as:  LASIX Take 1 tablet (40 mg total) by mouth daily.   lisinopril 2.5 MG tablet Commonly known as:  PRINIVIL,ZESTRIL Take 1 tablet (2.5 mg total) by mouth daily.   metoprolol tartrate 25 MG tablet Commonly known as:  LOPRESSOR Take 1 tablet (25 mg total) by mouth 2 (two) times daily.   NEXIUM 24HR 20 MG capsule Generic drug:  esomeprazole Take 20 mg by mouth at bedtime.   polyethylene glycol packet Commonly known as:  MIRALAX Take 17 g by mouth daily.   potassium chloride SA 20 MEQ tablet Commonly known as:  K-DUR,KLOR-CON Take 1 tablet (20 mEq total) by mouth daily.   timolol 0.5 % ophthalmic solution Commonly known as:  BETIMOL Place 1 drop into the right eye 2 (two) times daily.    traMADol 50 MG tablet Commonly known as:  ULTRAM Take 1 tablet (50 mg total) by mouth every 6 (six) hours as needed for moderate pain.   vitamin B-12 1000 MCG tablet Commonly known as:  CYANOCOBALAMIN Take 1,000 mcg by mouth at bedtime.       Review of Systems   In general is not complaining of any fever or chills.  Skin does not complain of rashes or itching surgical sites chest and legs bilaterally appear to be stable.  Head ears eyes nose mouth and throat doesn't history glaucoma right eye blindness in her left eye.  Does not complain  of sore throat.  Respiratory is not complaining of cough does have some shortness of breath although this does not appear to be new.  Cardiac does not complaining of chest pain does have some lower extremity edema.  GI is not complaining of any nausea vomiting diarrhea constipation or abdominal discomfort.  GU is not complaining of dysuria.  Muscle skeletal does not really complain of joint pain at this time.  Neurologic is not complaining of headache dizziness or syncope.  Psych appears to be in good spirits does have some history of anxiety it appears that this appears stable is not complaining of that this morning.     There is no immunization history on file for this patient. Pertinent  Health Maintenance Due  Topic Date Due  . DEXA SCAN  08/02/2004  . INFLUENZA VACCINE  02/24/2017 (Originally 10/26/2015)  . PNA vac Low Risk Adult (1 of 2 - PCV13) 02/24/2017 (Originally 08/02/2004)   No flowsheet data found. Functional Status Survey:   Temperature is 98.0 pulse 72 respirations 19 blood pressure 126/62 O2 saturation is 97% on room air.  In general this is a pleasant elderly female in no distress.  Her skin is warm and dry there is a well-healing midline chest scar with crusting I do not really see significant surrounding erythema or sign of infection.  RS sites lower legs bilaterally also appear fairly benign.  Eyes she does  have blindness in her left eye somewhat limited vision in her right she has prescription lenses sclera and conjunctiva are clear.  Oropharynx is clear mucous membranes moist.  Chest i largely  clear to auscultation-some very minimal crackles right side  Heart is regular rate and rhythm without murmur gallop or rub she has trace pedal edema I would say trace 1+ leg edema lower legs bilaterally.  Pedal pulses are intact bilaterally.  She has compression hose on.  Abdomen is obese soft nontender positive bowel sounds.  Muscle skeletal does move all extremities 4 she is ambulatory with some mild lower extremity weakness I suspect secondary to some debility.  Neurologic is grossly intact her speech is clear no lateralizing findings.  Psych she is alert and oriented pleasant and appropriate   Physical Exam  Labs reviewed:  Recent Labs  06/09/16 2030 06/10/16 0347 06/10/16 1808  06/11/16 0318 06/12/16 0400 06/13/16 0252  NA 139 140  --   < > 138 139 139  K 3.9 4.0  --   < > 4.0 3.3* 3.5  CL 107 110  --   < > 106 104 102  CO2  --  23  --   --  26 28 29   GLUCOSE 144* 127*  --   < > 110* 94 105*  BUN 4* 6  --   < > 12 14 9   CREATININE 0.65  0.70 0.78 1.03*  < > 1.05* 0.91 0.78  CALCIUM  --  7.8*  --   --  8.4* 8.3* 8.6*  MG 2.3 2.1 2.1  --   --   --   --   < > = values in this interval not displayed.  Recent Labs  06/04/16 1834 06/07/16 0922 06/08/16 0258  AST 28 23 20   ALT 17 14 13*  ALKPHOS 65 56 51  BILITOT 0.4 0.5 0.7  PROT 6.8 5.6* 5.3*  ALBUMIN 3.4* 3.0* 2.8*    Recent Labs  04/18/16 1511  06/04/16 1834  06/07/16 0922  06/11/16 0318 06/12/16 0400 06/13/16 0252  WBC  10.0  < > 7.2  < > 6.2  < > 14.8* 11.2* 9.9  NEUTROABS 5.6  --  3.6  --  2.7  --   --   --   --   HGB 15.1*  < > 12.7  < > 11.7*  < > 8.3* 8.1* 8.3*  HCT 42.9  < > 36.8  < > 36.1  < > 24.6* 24.0* 25.5*  MCV 89.7  < > 91.5  < > 93.3  < > 92.1 90.9 93.1  PLT 273  < > 290  < > 278  < > 135*  153 197  < > = values in this interval not displayed. No results found for: TSH Lab Results  Component Value Date   HGBA1C 5.4 06/08/2016   Lab Results  Component Value Date   CHOL 216 (H) 06/05/2016   HDL 36 (L) 06/05/2016   LDLCALC 154 (H) 06/05/2016   TRIG 128 06/05/2016   CHOLHDL 6.0 06/05/2016    Significant Diagnostic Results in last 30 days:  Dg Chest 2 View  Result Date: 06/13/2016 CLINICAL DATA:  76 year old female with history of shortness of breath and weakness. Postoperative evaluation. EXAM: CHEST  2 VIEW COMPARISON:  Chest x-ray 06/12/2016. FINDINGS: Previously noted right IJ central venous catheter has been removed. Lung volumes remain low. No consolidative airspace disease. Small bilateral pleural effusions. No evidence of pulmonary edema. Heart size is borderline enlarged. The patient is rotated to the right on today's exam, resulting in distortion of the mediastinal contours and reduced diagnostic sensitivity and specificity for mediastinal pathology. Atherosclerosis in the thoracic aorta. Status post median sternotomy for CABG. IMPRESSION: 1. Postoperative changes and support apparatus, as above. 2. Low lung volumes with small bilateral pleural effusions. Electronically Signed   By: Trudie Reed M.D.   On: 06/13/2016 08:29   [name]  Result Date: 06/05/2016 CLINICAL DATA:  Acute presentation with headache, confusion and speech disturbance. Numbness in the feet. EXAM: MRI HEAD WITHOUT CONTRAST MRA HEAD WITHOUT CONTRAST TECHNIQUE: Multiplanar, multiecho pulse sequences of the brain and surrounding structures were obtained without intravenous contrast. Angiographic images of the head were obtained using MRA technique without contrast. COMPARISON:  CT 06/04/2016.  MRI 04/18/2016. FINDINGS: MRI HEAD FINDINGS Brain: Diffusion imaging does not show any acute or subacute infarction. The brainstem is normal. There is an old infarction in the inferior cerebellum on  the right which is unchanged. Cerebral hemispheres show age related atrophy with moderate chronic small-vessel ischemic changes affecting the deep and subcortical white matter. No mass lesion, hemorrhage, hydrocephalus or extra-axial collection. No visible change since the previous study. Vascular: Major vessels at the base of the brain show flow. Skull and upper cervical spine: Negative Sinuses/Orbits: Clear/ normal. Small amount of mastoid air cell fluid. Other: None significant MRA HEAD FINDINGS Both internal carotid artery's are patent through the skullbase and siphon regions. There is considerable atherosclerotic irregularity in the carotid siphon regions but without suspicion of flow limiting stenosis. Supraclinoid internal carotid arteries are normal. Anterior and middle cerebral arteries are widely patent. Fetal origin left PCA. Left vertebral artery is dominant. Small right vertebral artery show antegrade flow to PICA up. No flow an the right vertebral artery beyond PICA. Basilar artery shows some atherosclerotic irregularity without flow limiting stenosis. Anterior inferior cerebellar arteries, superior cerebellar arteries and posterior cerebral arteries are patent bilaterally. Left PCA takes fetal origin as noted previously. IMPRESSION: MRI head: No acute finding. 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 Moderate chronic small-vessel ischemic changes affecting the cerebral hemispheres as seen previously. MRA head: No change. Atherosclerotic change in both carotid siphon regions without flow limiting stenosis. No intracranial large or medium vessel anterior circulation abnormal finding. Small right vertebral artery which gives flow to right PICA but does not contribute to the basilar. Dominant left vertebral artery widely patent to the basilar. Atherosclerotic irregularity of the basilar artery without flow limiting stenosis. Electronically Signed   By: Paulina Fusi M.D.   On: 06/05/2016 07:43    Dg Chest Port 1 View  Result Date: 06/12/2016 CLINICAL DATA:  Atelectasis, shortness of Breath EXAM: PORTABLE CHEST 1 VIEW COMPARISON:  06/11/2016 FINDINGS: Right central line remains in place, unchanged. Prior CABG. Very low lung volumes. No confluent opacities or effusions. Heart is normal size. IMPRESSION: Very low lung volumes.  No active disease. Electronically Signed   By: Charlett Nose M.D.   On: 06/12/2016 07:14   Dg Chest Port 1 View  Result Date: 06/11/2016 CLINICAL DATA:  Atelectasis. EXAM: PORTABLE CHEST 1 VIEW COMPARISON:  06/10/2016 FINDINGS: Patient is rotated to the right. Lungs are hypoinflated. Interval removal of right IJ Swan-Ganz catheter with IJ sheath remaining in place. Minimal prominence of the left perihilar markings likely due to the degree of rotation and hypoinflation. No lobar consolidation, effusion or pneumothorax. Cardiomediastinal silhouette and remainder of the exam is unchanged. IMPRESSION: Hypoinflation without acute cardiopulmonary disease. Right IJ sheath with tip over the SVC. Electronically Signed   By: Elberta Fortis M.D.   On: 06/11/2016 07:48   Dg Chest Port 1 View  Result Date: 06/10/2016 CLINICAL DATA:  Post CABG x4. EXAM: PORTABLE CHEST 1 VIEW COMPARISON:  06/09/2016 FINDINGS: Patient slightly rotated to the right. Sternotomy wires unchanged. Interval removal of nasogastric tube and endotracheal tubes. Right IJ Swan-Ganz catheter has tip over the right main pulmonary artery without significant change. Left-sided chest tube without significant change. Lungs are hypoinflated without focal airspace consolidation, effusion or pneumothorax. Cardiomediastinal silhouette and remainder of the exam is unchanged. IMPRESSION: Hypoinflation without acute cardiopulmonary disease. Tubes and lines as described. Electronically Signed   By: Elberta Fortis M.D.   On: 06/10/2016 08:28   Dg Chest Port 1 View  Result Date: 06/09/2016 CLINICAL DATA:  Of is Postop CABG. EXAM:  PORTABLE CHEST 1 VIEW COMPARISON:  06/04/2016 FINDINGS: Endotracheal tube is just above the carina directed toward the right mainstem bronchus and be retracted 1-2 cm. NG tube tip is in the fundus of the stomach. Swan-Ganz catheter tip is in the right lower lobe. Single left-sided chest tube in place. No pneumothorax. There is slight perihilar haziness consistent with minimal pulmonary edema. No effusions. Pulmonary vascularity is normal. Heart size is normal. IMPRESSION: 1. Minimal perihilar edema. 2. No pneumothorax. 3. Endotracheal tube is just above the right mainstem bronchus and could be retracted 2 cm. Electronically Signed   By: Francene Boyers M.D.   On: 06/09/2016 14:48   Dg Chest Port 1 View  Result Date: 06/04/2016 CLINICAL DATA:  Feet numbness after LEFT carotid artery stenting. Headache and confusion. EXAM: PORTABLE CHEST 1 VIEW COMPARISON:  Chest radiograph Aug 17, 2004 FINDINGS: Cardiomediastinal silhouette is unremarkable for this low inspiratory examination with crowded vasculature markings. The lungs are clear without pleural effusions or focal consolidations. Trachea projects midline and there is no pneumothorax. Included soft tissue planes and osseous structures are non-suspicious. IMPRESSION: No acute cardiopulmonary process for this low inspiratory portable examination. Electronically Signed   By: Pernell Dupre  Bloomer M.D.   On: 06/04/2016 19:21   [name] Head/brain RU Cm  Result Date: 06/05/2016 CLINICAL DATA:  Acute presentation with headache, confusion and speech disturbance. Numbness in the feet. EXAM: MRI HEAD WITHOUT CONTRAST MRA HEAD WITHOUT CONTRAST TECHNIQUE: Multiplanar, multiecho pulse sequences of the brain and surrounding structures were obtained without intravenous contrast. Angiographic images of the head were obtained using MRA technique without contrast. COMPARISON:  CT 06/04/2016.  MRI 04/18/2016. FINDINGS: MRI HEAD FINDINGS Brain: Diffusion imaging does not show any acute  or subacute infarction. The brainstem is normal. There is an old infarction in the inferior cerebellum on the right which is unchanged. Cerebral hemispheres show age related atrophy with moderate chronic small-vessel ischemic changes affecting the deep and subcortical white matter. No mass lesion, hemorrhage, hydrocephalus or extra-axial collection. No visible change since the previous study. Vascular: Major vessels at the base of the brain show flow. Skull and upper cervical spine: Negative Sinuses/Orbits: Clear/ normal. Small amount of mastoid air cell fluid. Other: None significant MRA HEAD FINDINGS Both internal carotid artery's are patent through the skullbase and siphon regions. There is considerable atherosclerotic irregularity in the carotid siphon regions but without suspicion of flow limiting stenosis. Supraclinoid internal carotid arteries are normal. Anterior and middle cerebral arteries are widely patent. Fetal origin left PCA. Left vertebral artery is dominant. Small right vertebral artery show antegrade flow to PICA up. No flow an the right vertebral artery beyond PICA. Basilar artery shows some atherosclerotic irregularity without flow limiting stenosis. Anterior inferior cerebellar arteries, superior cerebellar arteries and posterior cerebral arteries are patent bilaterally. Left PCA takes fetal origin as noted previously. IMPRESSION: MRI head: No acute finding. 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 Moderate chronic small-vessel ischemic changes affecting the cerebral hemispheres as seen previously. MRA head: No change. Atherosclerotic change in both carotid siphon regions without flow limiting stenosis. No intracranial large or medium vessel anterior circulation abnormal finding. Small right vertebral artery which gives flow to right PICA but does not contribute to the basilar. Dominant left vertebral artery widely patent to the basilar. Atherosclerotic irregularity of the basilar artery  without flow limiting stenosis. Electronically Signed   By: Paulina Fusi M.D.   On: 06/05/2016 07:43   Ct Head Code Stroke W/o Cm  Result Date: 06/04/2016 CLINICAL DATA:  Code stroke. Headache, confusion, speech difficulty beginning at 3 p.m. Status post LEFT carotid stent 1 week ago with subsequent foot numbness. History of hypertension, LEFT eye blindness, stroke. EXAM: CT HEAD WITHOUT CONTRAST TECHNIQUE: Contiguous axial images were obtained from the base of the skull through the vertex without intravenous contrast. COMPARISON:  MRI head and CTA HEAD April 18, 2016 FINDINGS: BRAIN: Stable moderate to severe ventriculomegaly on the basis of global parenchymal brain volume loss. Old RIGHT cerebellar infarct. No intraparenchymal hemorrhage, mass effect, midline shift or acute large vascular territory infarct. Moderate to severe stable ventriculomegaly on the basis of global parenchymal brain volume loss. No abnormal extra-axial fluid collections. Basal cisterns are patent. VASCULAR: Moderate calcific atherosclerosis of the carotid siphons. SKULL: No skull fracture. No significant scalp soft tissue swelling. SINUSES/ORBITS: The mastoid air-cells and included paranasal sinuses are well-aerated.Status post LEFT ocular lens implant. The included ocular globes and orbital contents are non-suspicious. OTHER: None. ASPECTS [facility] Stroke Program Early CT Score) - Ganglionic level infarction (caudate, lentiform nuclei, internal capsule, insula, M1-M3 cortex): 7 - Supraganglionic infarction (M4-M6 cortex): 3 Total score (0-10 with 10 being normal): 10 IMPRESSION: 1. No acute intracranial process.  Stable examination including mild to moderate chronic small vessel ischemic disease and old small RIGHT cerebellar infarct. 2. ASPECTS is 10. Critical Value/emergent results were called by telephone at the time of interpretation on 06/04/2016 at 6:57 pm to Dr. Samuel Jester , who verbally acknowledged these results.  Electronically Signed   By: Awilda Metro M.D.   On: 06/04/2016 18:59    Assessment/Plan  1 history of coronary disease status post CABG 4-she continues on aspirin 325 mg a day also is on lisinopril 2.5 mg a day as well as Lopressor 25 mg twice a day she will need close follow-up here by cardiology as well as weights monitored daily notify provider of gain greater than 3 pounds.  Continue sternal precautions.  #2 hypertension at this point appears well controlled although we have fairly minimal readings she is on Lopressor 25 mg twice a day as well as a central 2.5 mg daily.  History of prediabetes hemoglobin A1c was 5.4 and hospital which appears to be fairly satisfactory.  #3 history of anemia thought to be postoperative hemoglobin was 8.3 on most recent lab March 20 which is stable with other postop values will update this for follow-up.  #4 history of congestive heart failure with ejection fraction 45% on most recent echo-she is on Lasix 40 mg a day with potassium supplementation I note her potassium was slightly low at 1.9 hospital apparently this was supplemented most recent potassium was 3.5 will update this as well to keep an eye on her renal function and electrolytes.  #5 history of TIAs in the past again she continues on aspirin neurologically appear to be intact today.  #6 history of anxiety she is on low-dose Xanax twice a day.  #7 history of glaucoma she continues on topical eyedrops again she does have blindness in her left eye.  Again will update a CBC with her history anemia metabolic panel to keep an eye on her electrolytes.  Also she will need to be weighed daily notify provider of gain greater than 3 pounds.  [id number] note greater than 45 minutes spent assessing patient-reviewing her chart-reviewing her labs-discussing her status with nursing staff-and coordinating and formulating a plan of care for numerous diagnoses-of note greater than 50% of time spent  coordinating plan of care

## 2016-06-17 ENCOUNTER — Encounter (HOSPITAL_COMMUNITY)
Admission: RE | Admit: 2016-06-17 | Discharge: 2016-06-17 | Disposition: A | Discharge status: Home / Self Care | Payer: Medicare HMO | Source: Other Acute Inpatient Hospital | Attending: Internal Medicine | Admitting: Internal Medicine

## 2016-06-17 LAB — CBC WITH DIFFERENTIAL/PLATELET
Basophils Absolute: 0.1 10*3/uL (ref 0.0–0.1)
Basophils Relative: 1 %
Eosinophils Absolute: 1.6 10*3/uL — ABNORMAL HIGH (ref 0.0–0.7)
Eosinophils Relative: 17 %
HEMATOCRIT: 30.5 % — AB (ref 36.0–46.0)
HEMOGLOBIN: 9.9 g/dL — AB (ref 12.0–15.0)
LYMPHS ABS: 3 10*3/uL (ref 0.7–4.0)
LYMPHS PCT: 31 %
MCH: 31.5 pg (ref 26.0–34.0)
MCHC: 32.5 g/dL (ref 30.0–36.0)
MCV: 97.1 fL (ref 78.0–100.0)
MONO ABS: 0.6 10*3/uL (ref 0.1–1.0)
MONOS PCT: 6 %
NEUTROS ABS: 4.5 10*3/uL (ref 1.7–7.7)
NEUTROS PCT: 45 %
Platelets: 434 10*3/uL — ABNORMAL HIGH (ref 150–400)
RBC: 3.14 MIL/uL — ABNORMAL LOW (ref 3.87–5.11)
RDW: 15 % (ref 11.5–15.5)
WBC: 9.9 10*3/uL (ref 4.0–10.5)

## 2016-06-17 LAB — BASIC METABOLIC PANEL
Anion gap: 9 (ref 5–15)
BUN: 8 mg/dL (ref 6–20)
CALCIUM: 8.9 mg/dL (ref 8.9–10.3)
CHLORIDE: 100 mmol/L — AB (ref 101–111)
CO2: 28 mmol/L (ref 22–32)
CREATININE: 0.68 mg/dL (ref 0.44–1.00)
GFR calc non Af Amer: >60 mL/min (ref >60)
GLUCOSE: 93 mg/dL (ref 65–99)
Potassium: 4.5 mmol/L (ref 3.5–5.1)
Sodium: 137 mmol/L (ref 135–145)

## 2016-06-19 ENCOUNTER — Encounter: Payer: Self-pay | Admitting: Internal Medicine

## 2016-06-19 ENCOUNTER — Non-Acute Institutional Stay (SKILLED_NURSING_FACILITY): Payer: Medicare HMO | Admitting: Internal Medicine

## 2016-06-19 DIAGNOSIS — I10 Essential (primary) hypertension: Secondary | ICD-10-CM

## 2016-06-19 DIAGNOSIS — J029 Acute pharyngitis, unspecified: Secondary | ICD-10-CM | POA: Diagnosis not present

## 2016-06-19 DIAGNOSIS — E7849 Other hyperlipidemia: Secondary | ICD-10-CM

## 2016-06-19 DIAGNOSIS — Z951 Presence of aortocoronary bypass graft: Secondary | ICD-10-CM | POA: Diagnosis not present

## 2016-06-19 DIAGNOSIS — R05 Cough: Secondary | ICD-10-CM | POA: Diagnosis not present

## 2016-06-19 DIAGNOSIS — E784 Other hyperlipidemia: Secondary | ICD-10-CM | POA: Diagnosis not present

## 2016-06-19 DIAGNOSIS — R059 Cough, unspecified: Secondary | ICD-10-CM

## 2016-06-19 NOTE — Progress Notes (Signed)
Provider:  Einar CrowAnjali,Kearra Calkin Location:   [facility] Room Number: 109/D Place of Service:  SNF (31)  PCP: Cassell SmilesFUSCO,LAWRENCE J., MD Patient Care Team: Elfredia NevinsLawrence Fusco, MD as PCP - General (Internal Medicine)  Extended Emergency Contact Information Primary Emergency Contact: Morton AmyRobertson,Geneva          [place], [place] [address] [place] Home Phone: [phone] Relation: Daughter Secondary Emergency Contact: Hulick,Curtis          [place], [place] [address] [place] Home Phone: [phone] Relation: Son  Code Status: Full Code Goals of Care: Advanced Directive information Advanced Directives 06/19/2016  Does Patient Have a Medical Advance Directive? Yes  Type of Advance Directive (No Data)  Does patient want to make changes to medical advance directive? No - Patient declined  Copy of Healthcare Power of Attorney in Chart? -  Would patient like information on creating a medical advance directive? -      Chief Complaint  Patient presents with  . New Admit To SNF    HPI: Patient is a 76 y.o. female seen today for admission to SNF for therapy after CABG  Patient has h/o Left carotid endarterectomy on 05/29/2016, hypertension, hyperlipidemia, blindness in left eye, glaucoma    Patient came to the ED on 03/11 with episode of slurred speech and numbness in the legs.Stroke was ruled out with no acute changes in MRI and MRA. But she also had elevated tropnins. Cardiac Cath was done which showed Severe Multivessel disease.  2 D echo showed EF of 45 %. Patient agreed for CABG X 4 which was performed on 06/09/2016. Post op course Patient needed some diuresis due to volume overload. Otherwise she did well and was transferred to SNF for therapy. Patient has been doing well in Facility. She is able to walk with the walker. Her pain is controlled and her incision is healing well. She did c/o Cough with Productive yellow sputum. Denies any SOB, Chest pain .  No fever or chills. Patient has gained almost 3 lbs since she has been here.  Before surgery patient was living independent .She has daughter and son who live enar by and help her with her Chores. She wants to go  Home eventually or will consider Her daughters place.  Past Medical History:  Diagnosis Date  . Anxiety   . Blindness of left eye   . Complication of anesthesia    has gas trapped in her body after  . DVT (deep venous thrombosis) (HCC)   . Ear problems   . Family history of adverse reaction to anesthesia    daughter has gas trapped in her body postop as well  . Glaucoma   . Hypertension   . Pre-diabetes    pt. told by [facility] staff that she is prediabetic, HgbA1c was 5.4  . Stroke (HCC)    having TIA's   Past Surgical History:  Procedure Laterality Date  . BREAST SURGERY Left    cyst- L breast  . CHOLECYSTECTOMY    . CORONARY ARTERY BYPASS GRAFT N/A 06/09/2016   Procedure: CORONARY ARTERY BYPASS GRAFTING (CABG) times 4 using the left internal mammary artery and bilateral thigh greater saphenous veins harvested endoscopically. LIMA to LAD, SVG to DIAGONAL 2, SVG to OM, SVG to RCA;  Surgeon: Delight OvensEdward B Gerhardt, MD;  [facility];  Service: Open Heart Surgery;  Laterality: N/A;  . ENDARTERECTOMY Left 05/29/2016   Procedure: LEFT CAROTID ENDARTERECTOMY WITH PATCH ANGIOPLASTY;  Surgeon: Larina Earthlyodd F Early,  MD;  Location: [facility] OR;  Service: Vascular;  Laterality: Left;  . EYE SURGERY    . LEFT HEART CATH AND CORONARY ANGIOGRAPHY N/A 06/07/2016   Procedure: Left Heart Cath and Coronary Angiography;  Surgeon: Iran Ouch, MD;  Location: [facility] INVASIVE CV LAB;  Service: Cardiovascular;  Laterality: N/A;  . TEE WITHOUT CARDIOVERSION N/A 06/09/2016   Procedure: TRANSESOPHAGEAL ECHOCARDIOGRAM (TEE);  Surgeon: Delight Ovens, MD;  Location: [facility] OR;  Service: Open Heart Surgery;  Laterality: N/A;    reports that she has never smoked. She has never used smokeless tobacco. She reports that she  does not drink alcohol or use drugs. Social History   Social History  . Marital status: Divorced    Spouse name: N/A  . Number of children: N/A  . Years of education: N/A   Occupational History  . Not on file.   Social History Main Topics  . Smoking status: Never Smoker  . Smokeless tobacco: Never Used  . Alcohol use No  . Drug use: No  . Sexual activity: Not on file   Other Topics Concern  . Not on file   Social History Narrative  . No narrative on file    Functional Status Survey:    Family History  Problem Relation Age of Onset  . Heart disease Sister   . Heart disease Son     Health Maintenance  Topic Date Due  . Janet Berlin  08/03/1959  . DEXA SCAN  08/02/2004  . INFLUENZA VACCINE  02/24/2017 (Originally 10/26/2015)  . PNA vac Low Risk Adult (1 of 2 - PCV13) 02/24/2017 (Originally 08/02/2004)    Allergies  Allergen Reactions  . Other Hives    Mycins-   Pt has Glaucoma in Right eye, Blind in Left eye .... PLEASE DO NOT GIVE ANYTHING TO PATIENT THAT MIGHT DESTROY VISION   . Prednisone Other (See Comments)    Altered mental status  . Statins Other (See Comments)    MYALGIAS Weakness, muscle aches, and pain  . Erythromycin     UNSPECIFIED REACTION     Allergies as of 06/19/2016      Reactions   Other Hives   Mycins-  Pt has Glaucoma in Right eye, Blind in Left eye .... PLEASE DO NOT GIVE ANYTHING TO PATIENT THAT MIGHT DESTROY VISION    Prednisone Other (See Comments)   Altered mental status   Statins Other (See Comments)   MYALGIAS Weakness, muscle aches, and pain   Erythromycin    UNSPECIFIED REACTION       Medication List       Accurate as of 06/19/16 11:17 AM. Always use your most recent med list.          ALPRAZolam 0.25 MG tablet Commonly known as:  XANAX Take 0.5 tablets (0.125 mg total) by mouth 3 (three) times daily as needed for anxiety.   aspirin 325 MG EC tablet Take 1 tablet (325 mg total) by mouth daily.   furosemide 40  MG tablet Commonly known as:  LASIX Take 1 tablet (40 mg total) by mouth daily.   lisinopril 2.5 MG tablet Commonly known as:  PRINIVIL,ZESTRIL Take 1 tablet (2.5 mg total) by mouth daily.   metoprolol tartrate 25 MG tablet Commonly known as:  LOPRESSOR Take 1 tablet (25 mg total) by mouth 2 (two) times daily.   NEXIUM 24HR 20 MG capsule Generic drug:  esomeprazole Take 20 mg by mouth at bedtime.   polyethylene glycol packet Commonly known as:  MIRALAX Take 17 g by mouth daily.   potassium chloride SA 20 MEQ tablet Commonly known as:  K-DUR,KLOR-CON Take 1 tablet (20 mEq total) by mouth daily.   timolol 0.5 % ophthalmic solution Commonly known as:  BETIMOL Place 1 drop into the right eye 2 (two) times daily.   traMADol 50 MG tablet Commonly known as:  ULTRAM Take 1 tablet (50 mg total) by mouth every 6 (six) hours as needed for moderate pain.   vitamin B-12 1000 MCG tablet Commonly known as:  CYANOCOBALAMIN Take 1,000 mcg by mouth at bedtime.       Review of Systems  Constitutional: Positive for activity change and appetite change. Negative for chills and fatigue.  HENT: Positive for sore throat. Negative for postnasal drip, rhinorrhea, sinus pain and sinus pressure.   Respiratory: Positive for cough. Negative for shortness of breath and wheezing.   Cardiovascular: Positive for leg swelling. Negative for chest pain and palpitations.  Gastrointestinal: Negative.   Genitourinary: Negative.   Musculoskeletal: Negative.   Neurological: Positive for dizziness. Negative for tremors, syncope, weakness, light-headedness, numbness and headaches.  Psychiatric/Behavioral: Positive for sleep disturbance. Negative for confusion, decreased concentration and dysphoric mood. The patient is nervous/anxious.     Vitals:   06/19/16 1020  BP: (!) 99/57 Repeat Manually in LUE 150/94  Pulse: 86  Resp: 20  Temp: 97.1 F (36.2 C)  TempSrc: Oral   There is no height or weight on  file to calculate BMI. Physical Exam  Constitutional: She is oriented to person, place, and time. She appears well-developed and well-nourished.  HENT:  Head: Normocephalic.  Mouth/Throat: Posterior oropharyngeal erythema present.  Eyes: Pupils are equal, round, and reactive to light.  Opacity in Left eye  Neck: Neck supple.  Well healed incision on Left side  Cardiovascular: Normal rate, regular rhythm and normal heart sounds.   No murmur heard. Pulmonary/Chest: Effort normal and breath sounds normal. No respiratory distress. She has no wheezes.  Abdominal: Soft. Bowel sounds are normal. She exhibits no distension. There is no tenderness. There is no rebound.  Musculoskeletal:  Trace edema b/l  Lymphadenopathy:    She has no cervical adenopathy.  Neurological: She is alert and oriented to person, place, and time.  No focal deficit. Good strength in all extremities.  Skin: Skin is warm and dry.  Mid sternal incision healing well.  Psychiatric: She has a normal mood and affect. Her behavior is normal. Judgment and thought content normal.    Labs reviewed: Basic Metabolic Panel:  Recent Labs  16/10/96 2030 06/10/16 0347 06/10/16 1808  06/12/16 0400 06/13/16 0252 06/17/16 0750  NA 139 140  --   < > 139 139 137  K 3.9 4.0  --   < > 3.3* 3.5 4.5  CL 107 110  --   < > 104 102 100*  CO2  --  23  --   < > 28 29 28   GLUCOSE 144* 127*  --   < > 94 105* 93  BUN 4* 6  --   < > 14 9 8   CREATININE 0.65  0.70 0.78 1.03*  < > 0.91 0.78 0.68  CALCIUM  --  7.8*  --   < > 8.3* 8.6* 8.9  MG 2.3 2.1 2.1  --   --   --   --   < > = values in this interval not displayed. Liver Function Tests:  Recent Labs  06/04/16 1834 06/07/16 0922 06/08/16 0258  AST  28 23 20   ALT 17 14 13*  ALKPHOS 65 56 51  BILITOT 0.4 0.5 0.7  PROT 6.8 5.6* 5.3*  ALBUMIN 3.4* 3.0* 2.8*   No results for input(s): LIPASE, AMYLASE in the last 8760 hours. No results for input(s): AMMONIA in the last 8760  hours. CBC:  Recent Labs  06/04/16 1834  06/07/16 0922  06/12/16 0400 06/13/16 0252 06/17/16 0750  WBC 7.2  < > 6.2  < > 11.2* 9.9 9.9  NEUTROABS 3.6  --  2.7  --   --   --  4.5  HGB 12.7  < > 11.7*  < > 8.1* 8.3* 9.9*  HCT 36.8  < > 36.1  < > 24.0* 25.5* 30.5*  MCV 91.5  < > 93.3  < > 90.9 93.1 97.1  PLT 290  < > 278  < > 153 197 434*  < > = values in this interval not displayed. Cardiac Enzymes:  Recent Labs  06/05/16 0222 06/05/16 0806 06/05/16 1534  TROPONINI 3.12* 2.55* 2.19*   BNP: Invalid input(s): POCBNP Lab Results  Component Value Date   HGBA1C 5.4 06/08/2016   No results found for: TSH Lab Results  Component Value Date   VITAMINB12 3,082 (H) 04/19/2016   No results found for: FOLATE No results found for: IRON, TIBC, FERRITIN  Imaging and Procedures obtained prior to SNF admission: No results found.  Assessment/Plan Hypertension BP slightly elevated. Will continue to monitor as patient was very Anxious today. Continue Lotensin and Metoprolol.  S/P CABG x 4 Patient doing well . Walking with the walker. Will be able to eventually go home.Follow up with CVTS. Patient has gained some weight but will continue to monitor it closely.  Cough with sore throat. Will order Chest Xray to rule out Pneumonia.  Hyperlipidemia Her LDL in hospital was 153. Her daughter said she has not tolerated statins before. She wants to follow up with Cardiologist to see if she can be started on some new Meds.  Prediabetes A1C was 5.4 Continue Accu Checks. Anxiety Continue PRN Xanax.   Family/ staff Communication: Daughter in room  Labs/tests ordered: CBC, BMP, Chest xray  Total time spent in this patient care encounter was 45_ minutes; greater than 50% of the visit spent counseling patient and coordinating care for problems addressed at this encounter. Marland Kitchen

## 2016-06-20 ENCOUNTER — Encounter: Payer: Self-pay | Admitting: Vascular Surgery

## 2016-06-20 ENCOUNTER — Ambulatory Visit (INDEPENDENT_AMBULATORY_CARE_PROVIDER_SITE_OTHER): Payer: Self-pay | Admitting: Vascular Surgery

## 2016-06-20 VITALS — BP 112/70 | HR 75 | Temp 97.3°F | Resp 18 | Ht 62.0 in | Wt 188.0 lb

## 2016-06-20 DIAGNOSIS — Z9889 Other specified postprocedural states: Secondary | ICD-10-CM

## 2016-06-20 NOTE — Progress Notes (Signed)
  POST OPERATIVE OFFICE NOTE    CC:  F/u for surgery  HPI:  This is a 76 y.o. female who is s/p left carotid endarterectomy on 05/29/16.  She did well and had an uneventful hospital course.  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  While in the hospital, she did have a carotid duplex that revealed 1-39% stenosis of the right internal carotid artery and left internal carotid artery.  Vertebral arteries were antegrade flow.  On 06/09/16, she underwent CABG x 4 by Dr. Tyrone SageGerhardt.    She has been discharged on 06/15/16 to a rehab facility.  She presents back today for follow up.  She states that she is doing well.  She does have some numbness in the left leg.  She states that she has some soreness in the left chest.  Otherwise, she is progressing with rehab.  Her daughter states that she has done well.   Allergies  Allergen Reactions  . Other Hives    Mycins-   Pt has Glaucoma in Right eye, Blind in Left eye .... PLEASE DO NOT GIVE ANYTHING TO PATIENT THAT MIGHT DESTROY VISION   . Prednisone Other (See Comments)    Altered mental status  . Statins Other (See Comments)    MYALGIAS Weakness, muscle aches, and pain  . Erythromycin     UNSPECIFIED REACTION     No current outpatient prescriptions on file.   No current facility-administered medications for this visit.      ROS:  See HPI  Physical Exam:  Vitals:   06/20/16 1436  BP: 112/70  Pulse: 75  Resp: 18  Temp: 97.3 F (36.3 C)    Incision:  Healing nicely Extremities:  LLE with some edema; there is a palpable left AT pulse Neuro: in tact; no carotid bruits heard.   Assessment/Plan:  This is a 76 y.o. female who is s/p: Left carotid endarterectomy and subsequent CABG x 4  -pt is neurologically in tact. -her left neck incision is healing nicely. -she does have some complaints of numbness in the LLE.  This is the leg the vein was harvested from for bypass, which could explain the  numbness. She has an easily palpable left AT pulse.  -she does have some soreness in the left chest.  She is s/p median sternotomy and the LIMA was also harvested.  This should improve.  -she is on a daily aspirin.  She is not on a statin as she is allergic.  -f/u in 6 months with carotid duplex.  Sx of stroke were reviewed and she will contact us or call 911 if she has any of these sx.    Doreatha MassedSamantha Kayen Grabel, PA-C [facility] [phone]  Clinic MD:  Pt seen and examined with Dr. Arbie CookeyEarly.  I have examined the patient, reviewed and agree with above. Here today with her daughter. Readmitted several days after elective carotid surgery with unstable coronary disease requiring urgent coronary bypass grafting. Discussed this at length with the patient and her daughter. Fortunately did not have a bad outcome with her carotid surgery. Is recovering from both. We will continue her cardiac recovery no see her again in 6 months for full for repeat duplex  Gretta BeganEarly, Todd, MD 06/20/2016 3:36 PM

## 2016-06-21 NOTE — Addendum Note (Signed)
Addended by: Burton ApleyPETTY, Saben Donigan A on: 06/21/2016 09:46 AM   Modules accepted: Orders

## 2016-06-26 ENCOUNTER — Encounter: Payer: Self-pay | Admitting: Internal Medicine

## 2016-06-26 ENCOUNTER — Non-Acute Institutional Stay (SKILLED_NURSING_FACILITY): Payer: Medicare HMO | Admitting: Internal Medicine

## 2016-06-26 DIAGNOSIS — K5901 Slow transit constipation: Secondary | ICD-10-CM | POA: Diagnosis not present

## 2016-06-26 DIAGNOSIS — J00 Acute nasopharyngitis [common cold]: Secondary | ICD-10-CM

## 2016-06-26 DIAGNOSIS — Z951 Presence of aortocoronary bypass graft: Secondary | ICD-10-CM

## 2016-06-26 DIAGNOSIS — K5900 Constipation, unspecified: Secondary | ICD-10-CM | POA: Insufficient documentation

## 2016-06-26 DIAGNOSIS — K649 Unspecified hemorrhoids: Secondary | ICD-10-CM | POA: Diagnosis not present

## 2016-06-26 NOTE — Progress Notes (Signed)
Location:   [facility] Room Number: 144/P Place of Service:  SNF [phone]) Provider:  Georgette Shell, MD  Patient Care Team: Elfredia Nevins, MD as PCP - General (Internal Medicine)  Extended Emergency Contact Information Primary Emergency Contact: Morton Amy, [place] [address] [place] Home Phone: [phone] Relation: Daughter Secondary Emergency Contact: Asbridge,Curtis          [place], [place] [address] [place] Home Phone: [phone] Relation: Son  Code Status:  Full Code Goals of care: Advanced Directive information Advanced Directives 06/26/2016  Does Patient Have a Medical Advance Directive? Yes  Type of Advance Directive (No Data)  Does patient want to make changes to medical advance directive? No - Patient declined  Copy of Healthcare Power of Attorney in Chart? -  Would patient like information on creating a medical advance directive? -     Chief Complaint  Patient presents with  . Acute Visit    Hemorrhoids problem And sinus problems    HPI:  Pt is a 76 y.o. female seen today for an acute visit for Hemorrhoids and Nose backed up. Patient has h/o Left carotid endarterectomy on 05/29/2016, hypertension, hyperlipidemia,blindness in left eye, glaucoma And Now admitted to SNF for Therapy after CABG x4 She had noticed some bright red blood when she wiped herself yesterday and she says her Hemorrhoids are bothering her. She is also c/o constipation. Denies any abdominal pain or profound Rectal bleeding. Her appetite is good. She is also c/o runny nose mostly clear. Denies any sore throat, cough or fever or chills. Her incision is healing well. And she is doing well with therapy. Patient also wants her Pain meds to be discontinued as there are giving her bad dreams.  Past Medical History:  Diagnosis Date  . Anxiety   . Blindness of left eye   . Complication of anesthesia    has gas  trapped in her body after  . DVT (deep venous thrombosis) (HCC)   . Ear problems   . Family history of adverse reaction to anesthesia    daughter has gas trapped in her body postop as well  . Glaucoma   . Hypertension   . Pre-diabetes    pt. told by [facility] staff that she is prediabetic, HgbA1c was 5.4  . Stroke (HCC)    having TIA's   Past Surgical History:  Procedure Laterality Date  . BREAST SURGERY Left    cyst- L breast  . CHOLECYSTECTOMY    . CORONARY ARTERY BYPASS GRAFT N/A 06/09/2016   Procedure: CORONARY ARTERY BYPASS GRAFTING (CABG) times 4 using the left internal mammary artery and bilateral thigh greater saphenous veins harvested endoscopically. LIMA to LAD, SVG to DIAGONAL 2, SVG to OM, SVG to RCA;  Surgeon: Delight Ovens, MD;  Location: [facility] OR;  Service: Open Heart Surgery;  Laterality: N/A;  . ENDARTERECTOMY Left 05/29/2016   Procedure: LEFT CAROTID ENDARTERECTOMY WITH PATCH ANGIOPLASTY;  Surgeon: Larina Earthly, MD;  Location: [facility] OR;  Service: Vascular;  Laterality: Left;  . EYE SURGERY    . LEFT HEART CATH AND CORONARY ANGIOGRAPHY N/A 06/07/2016   Procedure: Left Heart Cath and Coronary Angiography;  Surgeon: Iran Ouch, MD;  Location: [facility] INVASIVE CV LAB;  Service: Cardiovascular;  Laterality: N/A;  . TEE WITHOUT CARDIOVERSION N/A 06/09/2016   Procedure: TRANSESOPHAGEAL ECHOCARDIOGRAM (TEE);  Surgeon: Delight Ovens, MD;  Location: [facility] OR;  Service: Open Heart Surgery;  Laterality: N/A;    Allergies  Allergen Reactions  . Other Hives    Mycins-   Pt has Glaucoma in Right eye, Blind in Left eye .... PLEASE DO NOT GIVE ANYTHING TO PATIENT THAT MIGHT DESTROY VISION   . Prednisone Other (See Comments)    Altered mental status  . Statins Other (See Comments)    MYALGIAS Weakness, muscle aches, and pain  . Erythromycin     UNSPECIFIED REACTION     Current Outpatient Prescriptions on File Prior to Visit  Medication Sig Dispense Refill  . ALPRAZolam (XANAX) 0.25  MG tablet Take 0.5 tablets (0.125 mg total) by mouth 3 (three) times daily as needed for anxiety. 30 tablet 0  . aspirin EC 325 MG EC tablet Take 1 tablet (325 mg total) by mouth daily. 30 tablet 0  . esomeprazole (NEXIUM 24HR) 20 MG capsule Take 20 mg by mouth at bedtime.     . furosemide (LASIX) 40 MG tablet Take 1 tablet (40 mg total) by mouth daily. 30 tablet   . lisinopril (PRINIVIL,ZESTRIL) 2.5 MG tablet Take 1 tablet (2.5 mg total) by mouth daily.    . metoprolol tartrate (LOPRESSOR) 25 MG tablet Take 1 tablet (25 mg total) by mouth 2 (two) times daily.    . polyethylene glycol (MIRALAX) packet Take 17 g by mouth daily. 14 each 0  . potassium chloride SA (K-DUR,KLOR-CON) 20 MEQ tablet Take 1 tablet (20 mEq total) by mouth daily.    . timolol (BETIMOL) 0.5 % ophthalmic solution Place 1 drop into the right eye 2 (two) times daily.     . traMADol (ULTRAM) 50 MG tablet Take 1 tablet (50 mg total) by mouth every 6 (six) hours as needed for moderate pain. 30 tablet 0  . vitamin B-12 (CYANOCOBALAMIN) 1000 MCG tablet Take 1,000 mcg by mouth at bedtime.      No current facility-administered medications on file prior to visit.     Review of Systems  Constitutional: Negative for activity change, appetite change, chills, fatigue and fever.  HENT: Positive for congestion, postnasal drip and rhinorrhea. Negative for sinus pain, sinus pressure and sore throat.   Respiratory: Negative.   Cardiovascular: Negative.   Gastrointestinal: Positive for blood in stool, constipation and rectal pain.  Genitourinary: Negative.   Musculoskeletal: Negative.   Skin: Negative.   Neurological: Negative.        There is no immunization history on file for this patient. Pertinent  Health Maintenance Due  Topic Date Due  . DEXA SCAN  08/02/2004  . INFLUENZA VACCINE  02/24/2017 (Originally 10/25/2016)  . PNA vac Low Risk Adult (1 of 2 - PCV13) 02/24/2017 (Originally 08/02/2004)   No flowsheet data  found. Functional Status Survey:    There were no vitals filed for this visit. There is no height or weight on file to calculate BMI. Physical Exam  Constitutional: She is oriented to person, place, and time. She appears well-developed and well-nourished.  HENT:  Head: Normocephalic.  Nose: Right sinus exhibits no maxillary sinus tenderness and no frontal sinus tenderness. Left sinus exhibits no maxillary sinus tenderness and no frontal sinus tenderness.  Mouth/Throat: Oropharynx is clear and moist.  Cardiovascular: Normal rate, regular rhythm and normal heart sounds.   Pulmonary/Chest: Effort normal and breath sounds normal. No respiratory distress. She has no wheezes. She has no rales. She exhibits no tenderness.  Abdominal: Soft. Bowel sounds are normal. She exhibits no distension. There is no tenderness.  There is no rebound.  Patient had External Hemorrhoids. Were not swollen or tender or Actively bleeding.  Neurological: She is alert and oriented to person, place, and time.  Skin: Skin is warm and dry.    Labs reviewed:  Recent Labs  06/09/16 2030 06/10/16 0347 06/10/16 1808  06/12/16 0400 06/13/16 0252 06/17/16 0750  NA 139 140  --   < > 139 139 137  K 3.9 4.0  --   < > 3.3* 3.5 4.5  CL 107 110  --   < > 104 102 100*  CO2  --  23  --   < > GLUCOSE 144* 127*  --   < > 94 105* 93  BUN 4* 6  --   < > CREATININE 0.65  0.70 0.78 1.03*  < > 0.91 0.78 0.68  CALCIUM  --  7.8*  --   < > 8.3* 8.6* 8.9  MG 2.3 2.1 2.1  --   --   --   --   < > = values in this interval not displayed.  Recent Labs  06/04/16 1834 06/07/16 0922 06/08/16 0258  AST ALT 17 14 13*  ALKPHOS 65 56 51  BILITOT 0.4 0.5 0.7  PROT 6.8 5.6* 5.3*  ALBUMIN 3.4* 3.0* 2.8*    Recent Labs  06/04/16 1834  06/07/16 0922  06/12/16 0400 06/13/16 0252 06/17/16 0750  WBC 7.2  < > 6.2  < > 11.2* 9.9 9.9  NEUTROABS 3.6  --  2.7  --   --   --  4.5  HGB 12.7  < > 11.7*  < >  8.1* 8.3* 9.9*  HCT 36.8  < > 36.1  < > 24.0* 25.5* 30.5*  MCV 91.5  < > 93.3  < > 90.9 93.1 97.1  PLT 290  < > 278  < > 153 197 434*  < > = values in this interval not displayed. No results found for: TSH Lab Results  Component Value Date   HGBA1C 5.4 06/08/2016   Lab Results  Component Value Date   CHOL 216 (H) 06/05/2016   HDL 36 (L) 06/05/2016   LDLCALC 154 (H) 06/05/2016   TRIG 128 06/05/2016   CHOLHDL 6.0 06/05/2016    Significant Diagnostic Results in last 30 days:  Dg Chest 2 View  Result Date: 06/13/2016 CLINICAL DATA:  76 year old female with history of shortness of breath and weakness. Postoperative evaluation. EXAM: CHEST  2 VIEW COMPARISON:  Chest x-ray 06/12/2016. FINDINGS: Previously noted right IJ central venous catheter has been removed. Lung volumes remain low. No consolidative airspace disease. Small bilateral pleural effusions. No evidence of pulmonary edema. Heart size is borderline enlarged. The patient is rotated to the right on today's exam, resulting in distortion of the mediastinal contours and reduced diagnostic sensitivity and specificity for mediastinal pathology. Atherosclerosis in the thoracic aorta. Status post median sternotomy for CABG. IMPRESSION: 1. Postoperative changes and support apparatus, as above. 2. Low lung volumes with small bilateral pleural effusions. Electronically Signed   By: Trudie Reed M.D.   On: 06/13/2016 08:29   [name]  Result Date: 06/05/2016 CLINICAL DATA:  Acute presentation with headache, confusion and speech disturbance. Numbness in the feet. EXAM: MRI HEAD WITHOUT CONTRAST MRA HEAD WITHOUT CONTRAST TECHNIQUE: Multiplanar, multiecho pulse sequences of the brain and surrounding structures were obtained without intravenous contrast. Angiographic images of the head were obtained using  MRA technique without contrast. COMPARISON:  CT 06/04/2016.  MRI 04/18/2016. FINDINGS: MRI HEAD FINDINGS Brain: Diffusion imaging  does not show any acute or subacute infarction. The brainstem is normal. There is an old infarction in the inferior cerebellum on the right which is unchanged. Cerebral hemispheres show age related atrophy with moderate chronic small-vessel ischemic changes affecting the deep and subcortical white matter. No mass lesion, hemorrhage, hydrocephalus or extra-axial collection. No visible change since the previous study. Vascular: Major vessels at the base of the brain show flow. Skull and upper cervical spine: Negative Sinuses/Orbits: Clear/ normal. Small amount of mastoid air cell fluid. Other: None significant MRA HEAD FINDINGS Both internal carotid artery's are patent through the skullbase and siphon regions. There is considerable atherosclerotic irregularity in the carotid siphon regions but without suspicion of flow limiting stenosis. Supraclinoid internal carotid arteries are normal. Anterior and middle cerebral arteries are widely patent. Fetal origin left PCA. Left vertebral artery is dominant. Small right vertebral artery show antegrade flow to PICA up. No flow an the right vertebral artery beyond PICA. Basilar artery shows some atherosclerotic irregularity without flow limiting stenosis. Anterior inferior cerebellar arteries, superior cerebellar arteries and posterior cerebral arteries are patent bilaterally. Left PCA takes fetal origin as noted previously. IMPRESSION: MRI head: No acute finding. 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 Moderate chronic small-vessel ischemic changes affecting the cerebral hemispheres as seen previously. MRA head: No change. Atherosclerotic change in both carotid siphon regions without flow limiting stenosis. No intracranial large or medium vessel anterior circulation abnormal finding. Small right vertebral artery which gives flow to right PICA but does not contribute to the basilar. Dominant left vertebral artery widely patent to the basilar. Atherosclerotic  irregularity of the basilar artery without flow limiting stenosis. Electronically Signed   By: Paulina Fusi M.D.   On: 06/05/2016 07:43   Dg Chest Port 1 View  Result Date: 06/12/2016 CLINICAL DATA:  Atelectasis, shortness of Breath EXAM: PORTABLE CHEST 1 VIEW COMPARISON:  06/11/2016 FINDINGS: Right central line remains in place, unchanged. Prior CABG. Very low lung volumes. No confluent opacities or effusions. Heart is normal size. IMPRESSION: Very low lung volumes.  No active disease. Electronically Signed   By: Charlett Nose M.D.   On: 06/12/2016 07:14   Dg Chest Port 1 View  Result Date: 06/11/2016 CLINICAL DATA:  Atelectasis. EXAM: PORTABLE CHEST 1 VIEW COMPARISON:  06/10/2016 FINDINGS: Patient is rotated to the right. Lungs are hypoinflated. Interval removal of right IJ Swan-Ganz catheter with IJ sheath remaining in place. Minimal prominence of the left perihilar markings likely due to the degree of rotation and hypoinflation. No lobar consolidation, effusion or pneumothorax. Cardiomediastinal silhouette and remainder of the exam is unchanged. IMPRESSION: Hypoinflation without acute cardiopulmonary disease. Right IJ sheath with tip over the SVC. Electronically Signed   By: Elberta Fortis M.D.   On: 06/11/2016 07:48   Dg Chest Port 1 View  Result Date: 06/10/2016 CLINICAL DATA:  Post CABG x4. EXAM: PORTABLE CHEST 1 VIEW COMPARISON:  06/09/2016 FINDINGS: Patient slightly rotated to the right. Sternotomy wires unchanged. Interval removal of nasogastric tube and endotracheal tubes. Right IJ Swan-Ganz catheter has tip over the right main pulmonary artery without significant change. Left-sided chest tube without significant change. Lungs are hypoinflated without focal airspace consolidation, effusion or pneumothorax. Cardiomediastinal silhouette and remainder of the exam is unchanged. IMPRESSION: Hypoinflation without acute cardiopulmonary disease. Tubes and lines as described. Electronically Signed   By:  Elberta Fortis M.D.  On: 06/10/2016 08:28   Dg Chest Port 1 View  Result Date: 06/09/2016 CLINICAL DATA:  Of is Postop CABG. EXAM: PORTABLE CHEST 1 VIEW COMPARISON:  06/04/2016 FINDINGS: Endotracheal tube is just above the carina directed toward the right mainstem bronchus and be retracted 1-2 cm. NG tube tip is in the fundus of the stomach. Swan-Ganz catheter tip is in the right lower lobe. Single left-sided chest tube in place. No pneumothorax. There is slight perihilar haziness consistent with minimal pulmonary edema. No effusions. Pulmonary vascularity is normal. Heart size is normal. IMPRESSION: 1. Minimal perihilar edema. 2. No pneumothorax. 3. Endotracheal tube is just above the right mainstem bronchus and could be retracted 2 cm. Electronically Signed   By: Francene Boyers M.D.   On: 06/09/2016 14:48   Dg Chest Port 1 View  Result Date: 06/04/2016 CLINICAL DATA:  Feet numbness after LEFT carotid artery stenting. Headache and confusion. EXAM: PORTABLE CHEST 1 VIEW COMPARISON:  Chest radiograph Aug 17, 2004 FINDINGS: Cardiomediastinal silhouette is unremarkable for this low inspiratory examination with crowded vasculature markings. The lungs are clear without pleural effusions or focal consolidations. Trachea projects midline and there is no pneumothorax. Included soft tissue planes and osseous structures are non-suspicious. IMPRESSION: No acute cardiopulmonary process for this low inspiratory portable examination. Electronically Signed   By: Awilda Metro M.D.   On: 06/04/2016 19:21   [name] Head/brain JX Cm  Result Date: 06/05/2016 CLINICAL DATA:  Acute presentation with headache, confusion and speech disturbance. Numbness in the feet. EXAM: MRI HEAD WITHOUT CONTRAST MRA HEAD WITHOUT CONTRAST TECHNIQUE: Multiplanar, multiecho pulse sequences of the brain and surrounding structures were obtained without intravenous contrast. Angiographic images of the head were obtained using MRA technique  without contrast. COMPARISON:  CT 06/04/2016.  MRI 04/18/2016. FINDINGS: MRI HEAD FINDINGS Brain: Diffusion imaging does not show any acute or subacute infarction. The brainstem is normal. There is an old infarction in the inferior cerebellum on the right which is unchanged. Cerebral hemispheres show age related atrophy with moderate chronic small-vessel ischemic changes affecting the deep and subcortical white matter. No mass lesion, hemorrhage, hydrocephalus or extra-axial collection. No visible change since the previous study. Vascular: Major vessels at the base of the brain show flow. Skull and upper cervical spine: Negative Sinuses/Orbits: Clear/ normal. Small amount of mastoid air cell fluid. Other: None significant MRA HEAD FINDINGS Both internal carotid artery's are patent through the skullbase and siphon regions. There is considerable atherosclerotic irregularity in the carotid siphon regions but without suspicion of flow limiting stenosis. Supraclinoid internal carotid arteries are normal. Anterior and middle cerebral arteries are widely patent. Fetal origin left PCA. Left vertebral artery is dominant. Small right vertebral artery show antegrade flow to PICA up. No flow an the right vertebral artery beyond PICA. Basilar artery shows some atherosclerotic irregularity without flow limiting stenosis. Anterior inferior cerebellar arteries, superior cerebellar arteries and posterior cerebral arteries are patent bilaterally. Left PCA takes fetal origin as noted previously. IMPRESSION: MRI head: No acute finding. 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 Moderate chronic small-vessel ischemic changes affecting the cerebral hemispheres as seen previously. MRA head: No change. Atherosclerotic change in both carotid siphon regions without flow limiting stenosis. No intracranial large or medium vessel anterior circulation abnormal finding. Small right vertebral artery which gives flow to right PICA but does  not contribute to the basilar. Dominant left vertebral artery widely patent to the basilar. Atherosclerotic irregularity of the basilar artery without flow limiting stenosis. Electronically Signed  By: Paulina Fusi M.D.   On: 06/05/2016 07:43   Ct Head Code Stroke W/o Cm  Result Date: 06/04/2016 CLINICAL DATA:  Code stroke. Headache, confusion, speech difficulty beginning at 3 p.m. Status post LEFT carotid stent 1 week ago with subsequent foot numbness. History of hypertension, LEFT eye blindness, stroke. EXAM: CT HEAD WITHOUT CONTRAST TECHNIQUE: Contiguous axial images were obtained from the base of the skull through the vertex without intravenous contrast. COMPARISON:  MRI head and CTA HEAD April 18, 2016 FINDINGS: BRAIN: Stable moderate to severe ventriculomegaly on the basis of global parenchymal brain volume loss. Old RIGHT cerebellar infarct. No intraparenchymal hemorrhage, mass effect, midline shift or acute large vascular territory infarct. Moderate to severe stable ventriculomegaly on the basis of global parenchymal brain volume loss. No abnormal extra-axial fluid collections. Basal cisterns are patent. VASCULAR: Moderate calcific atherosclerosis of the carotid siphons. SKULL: No skull fracture. No significant scalp soft tissue swelling. SINUSES/ORBITS: The mastoid air-cells and included paranasal sinuses are well-aerated.Status post LEFT ocular lens implant. The included ocular globes and orbital contents are non-suspicious. OTHER: None. ASPECTS [facility] Stroke Program Early CT Score) - Ganglionic level infarction (caudate, lentiform nuclei, internal capsule, insula, M1-M3 cortex): 7 - Supraganglionic infarction (M4-M6 cortex): 3 Total score (0-10 with 10 being normal): 10 IMPRESSION: 1. No acute intracranial process. Stable examination including mild to moderate chronic small vessel ischemic disease and old small RIGHT cerebellar infarct. 2. ASPECTS is 10. Critical Value/emergent results were  called by telephone at the time of interpretation on 06/04/2016 at 6:57 pm to Dr. Samuel Jester , who verbally acknowledged these results. Electronically Signed   By: Awilda Metro M.D.   On: 06/04/2016 18:59    Assessment/Plan Hemorrhoids Will start patient on Anusol HC for 7 days. Also D/w Nurse patient has been Constipated. Will make her Miralax BID hold for diarrhea.  Acute rhinitis Patient is very specific that only  Allegra once a day works for her. Restart Allegra 180 mg QD  S/P CABG x 4 She also is concerned about Ultram causing Night dreams. Discontinue Ultram. Use PRN tylenol. Patient doing well with therapy.   constipation Increased the dose of Miralax     Family/ staff Communication:   Labs/tests ordered:

## 2016-06-29 ENCOUNTER — Encounter: Payer: Self-pay | Admitting: Internal Medicine

## 2016-06-29 ENCOUNTER — Non-Acute Institutional Stay (SKILLED_NURSING_FACILITY): Payer: Medicare HMO | Admitting: Internal Medicine

## 2016-06-29 DIAGNOSIS — I25119 Atherosclerotic heart disease of native coronary artery with unspecified angina pectoris: Secondary | ICD-10-CM

## 2016-06-29 DIAGNOSIS — I214 Non-ST elevation (NSTEMI) myocardial infarction: Secondary | ICD-10-CM | POA: Diagnosis not present

## 2016-06-29 DIAGNOSIS — K5901 Slow transit constipation: Secondary | ICD-10-CM | POA: Diagnosis not present

## 2016-06-29 NOTE — Progress Notes (Signed)
Location:   [facility] Room Number: 144/P Place of Service:  SNF (31)  Provider: Edmon Crape  PCP: Cassell Smiles, MD Patient Care Team: Elfredia Nevins, MD as PCP - General (Internal Medicine)  Extended Emergency Contact Information Primary Emergency Contact: [place], [place] [address] [place] Home Phone: [phone] Relation: Daughter Secondary Emergency Contact: Cryder,Curtis          [place], [place] [address] [place] Home Phone: [phone] Relation: Son  Code Status: Full Code Goals of care:  Advanced Directive information Advanced Directives 06/29/2016  Does Patient Have a Medical Advance Directive? Yes  Type of Advance Directive (No Data)  Does patient want to make changes to medical advance directive? No - Patient declined  Copy of Healthcare Power of Attorney in Chart? -  Would patient like information on creating a medical advance directive? -     Allergies  Allergen Reactions  . Other Hives    Mycins-   Pt has Glaucoma in Right eye, Blind in Left eye .... PLEASE DO NOT GIVE ANYTHING TO PATIENT THAT MIGHT DESTROY VISION   . Prednisone Other (See Comments)    Altered mental status  . Statins Other (See Comments)    MYALGIAS Weakness, muscle aches, and pain  . Erythromycin     UNSPECIFIED REACTION     Chief Complaint  Patient presents with  . Discharge Note    HPI:  76 y.o. female  seen for discharge from facility later this week--. She is status post CABG 4.  She initially presented to the ER with altered speech.  CT scan did not show any evidence of a CVA she did have an elevated troponin she was evaluated by cardiology and subsequently underwent a cardiac catheterization which showed severe multivessel coronary artery disease.--She  underwent a CABG 4  Ejection fraction is noted be 45% per echo.  Carotid Doppler did not show significant internal carotid artery  stenosis-she id status post  recent left carotid endarterectomy on 05/29/2016.  And did well with that--and actually has already had follow-up for this.  In the hospital she did undergo diuresis because of volume overload.  Her stay here is been relatively unremarkable she did complain of hemorrhoid pain and has received a course of Anusol also complained of constipation her MiraLAX was increased.  Also had complaints of allergy symptoms was started on Allegra apparently with beneficial effect  Today she has no complaints appears to be in good spirits and looking forward to going home .        Past Medical History:  Diagnosis Date  . Anxiety   . Blindness of left eye   . Complication of anesthesia    has gas trapped in her body after  . DVT (deep venous thrombosis) (HCC)   . Ear problems   . Family history of adverse reaction to anesthesia    daughter has gas trapped in her body postop as well  . Glaucoma   . Hypertension   . Pre-diabetes    pt. told by [facility] staff that she is prediabetic, HgbA1c was 5.4  . Stroke (HCC)    having TIA's    Past Surgical History:  Procedure Laterality Date  . BREAST SURGERY Left    cyst- L breast  . CHOLECYSTECTOMY    . CORONARY ARTERY BYPASS GRAFT N/A 06/09/2016   Procedure: CORONARY ARTERY BYPASS GRAFTING (CABG) times 4 using the left  internal mammary artery and bilateral thigh greater saphenous veins harvested endoscopically. LIMA to LAD, SVG to DIAGONAL 2, SVG to OM, SVG to RCA;  Surgeon: Delight Ovens, MD;  Location: [facility] OR;  Service: Open Heart Surgery;  Laterality: N/A;  . ENDARTERECTOMY Left 05/29/2016   Procedure: LEFT CAROTID ENDARTERECTOMY WITH PATCH ANGIOPLASTY;  Surgeon: Larina Earthly, MD;  Location: [facility] OR;  Service: Vascular;  Laterality: Left;  . EYE SURGERY    . LEFT HEART CATH AND CORONARY ANGIOGRAPHY N/A 06/07/2016   Procedure: Left Heart Cath and Coronary Angiography;  Surgeon: Iran Ouch, MD;  Location: [facility] INVASIVE  CV LAB;  Service: Cardiovascular;  Laterality: N/A;  . TEE WITHOUT CARDIOVERSION N/A 06/09/2016   Procedure: TRANSESOPHAGEAL ECHOCARDIOGRAM (TEE);  Surgeon: Delight Ovens, MD;  Location: [facility] OR;  Service: Open Heart Surgery;  Laterality: N/A;      reports that she has never smoked. She has never used smokeless tobacco. She reports that she does not drink alcohol or use drugs. Social History   Social History  . Marital status: Divorced    Spouse name: N/A  . Number of children: N/A  . Years of education: N/A   Occupational History  . Not on file.   Social History Main Topics  . Smoking status: Never Smoker  . Smokeless tobacco: Never Used  . Alcohol use No  . Drug use: No  . Sexual activity: Not on file   Other Topics Concern  . Not on file   Social History Narrative  . No narrative on file   Functional Status Survey:    Allergies  Allergen Reactions  . Other Hives    Mycins-   Pt has Glaucoma in Right eye, Blind in Left eye .... PLEASE DO NOT GIVE ANYTHING TO PATIENT THAT MIGHT DESTROY VISION   . Prednisone Other (See Comments)    Altered mental status  . Statins Other (See Comments)    MYALGIAS Weakness, muscle aches, and pain  . Erythromycin     UNSPECIFIED REACTION     Pertinent  Health Maintenance Due  Topic Date Due  . DEXA SCAN  08/02/2004  . INFLUENZA VACCINE  02/24/2017 (Originally 10/25/2016)  . PNA vac Low Risk Adult (1 of 2 - PCV13) 02/24/2017 (Originally 08/02/2004)    Medications: Current Outpatient Prescriptions on File Prior to Visit  Medication Sig Dispense Refill  . ALPRAZolam (XANAX) 0.25 MG tablet Take 0.5 tablets (0.125 mg total) by mouth 3 (three) times daily as needed for anxiety. 30 tablet 0  . aspirin EC 325 MG EC tablet Take 1 tablet (325 mg total) by mouth daily. 30 tablet 0  . esomeprazole (NEXIUM 24HR) 20 MG capsule Take 20 mg by mouth at bedtime.     . furosemide (LASIX) 40 MG tablet Take 1 tablet (40 mg total) by mouth daily. 30  tablet   . lisinopril (PRINIVIL,ZESTRIL) 2.5 MG tablet Take 1 tablet (2.5 mg total) by mouth daily.    . metoprolol tartrate (LOPRESSOR) 25 MG tablet Take 1 tablet (25 mg total) by mouth 2 (two) times daily.    . polyethylene glycol (MIRALAX) packet Take 17 g by mouth daily. 14 each 0  . potassium chloride SA (K-DUR,KLOR-CON) 20 MEQ tablet Take 1 tablet (20 mEq total) by mouth daily.    . timolol (BETIMOL) 0.5 % ophthalmic solution Place 1 drop into the right eye 2 (two) times daily.     . vitamin B-12 (CYANOCOBALAMIN) 1000 MCG tablet Take 1,000  mcg by mouth at bedtime.      No current facility-administered medications on file prior to visit.      Review of Systems In general is not complaining of any fever or chills.  Skin does not complain of rashes or itching surgical sites chest and legs bilaterally appear to be stable. Also incision site on left   neckpears benign  Head ears eyes nose mouth and throat -- history glaucoma right eye blindness in her left eye.  Does not complain of sore throat.  Says allergy drainage has improved significantly  Respiratory is not complaining of cough or shortness of breath.  Cardiac does not complaining of chest pain does have some lower extremity edema. But this appears stable  GI is not complaining of any nausea vomiting diarrhea constipation or abdominal discomfort. Says hemorrhoid pain has resolved GU is not complaining of dysuria.  Muscle skeletal does not really complain of joint pain at this time.  Neurologic is not complaining of headache dizziness or syncope.--Does have some residual numbness left chest and face but thinks  this is gradually improving  Psych appears to be in good spirits does have some history of anxiety it appears that this appears stable is not complaining of that this afternoon Vitals:   06/29/16 1353  BP: 121/69  Pulse: 73  Resp: 20  Temp: 97.2 F (36.2 C)  TempSrc: Oral  Weight: 178 lb 12.8 oz  (81.1 kg)  Height: 4' 7.5" (1.41 m)   Body mass index is 40.81 kg/m. Physical Exam    In general this is a pleasant elderly female in no distress sitting comfortably on the side of her bed.  Her skin is warm and dry there is a well-healing midline chest scar with crusting I do not really see significant surrounding erythema or sign of infection.  Harvest sites lower legs bilaterally also appear  benign.  Eyes she does have blindness in her left eye somewhat limited vision in her right she has prescription lenses sclera and conjunctiva are clear.  Oropharynx is clear mucous membranes moist.  Chest--clear to auscultation there is no labored breathing  Heart is regular rate and rhythm without murmur gallop or rub she has trace pedal edema I would say trace 1+ leg edema lower legs bilaterally.   Abdomen is obese soft nontender positive bowel sounds.  Muscle skeletal does move all extremities 4  is able to ambulate but has some weakness I would benefit from PT and OT y.  Neurologic is grossly intact her speech is clear no lateralizing findings.  Psych she is alert and oriented pleasant and appropriate  Labs reviewed: Basic Metabolic Panel:  Recent Labs  60/45/40 2030 06/10/16 0347 06/10/16 1808  06/12/16 0400 06/13/16 0252 06/17/16 0750  NA 139 140  --   < > 139 139 137  K 3.9 4.0  --   < > 3.3* 3.5 4.5  CL 107 110  --   < > 104 102 100*  CO2  --  23  --   < > 28 29 28   GLUCOSE 144* 127*  --   < > 94 105* 93  BUN 4* 6  --   < > 14 9 8   CREATININE 0.65  0.70 0.78 1.03*  < > 0.91 0.78 0.68  CALCIUM  --  7.8*  --   < > 8.3* 8.6* 8.9  MG 2.3 2.1 2.1  --   --   --   --   < > =  values in this interval not displayed. Liver Function Tests:  Recent Labs  06/04/16 1834 06/07/16 0922 06/08/16 0258  AST 28 23 20   ALT 17 14 13*  ALKPHOS 65 56 51  BILITOT 0.4 0.5 0.7  PROT 6.8 5.6* 5.3*  ALBUMIN 3.4* 3.0* 2.8*   No results for input(s): LIPASE, AMYLASE in  the last 8760 hours. No results for input(s): AMMONIA in the last 8760 hours. CBC:  Recent Labs  06/04/16 1834  06/07/16 0922  06/12/16 0400 06/13/16 0252 06/17/16 0750  WBC 7.2  < > 6.2  < > 11.2* 9.9 9.9  NEUTROABS 3.6  --  2.7  --   --   --  4.5  HGB 12.7  < > 11.7*  < > 8.1* 8.3* 9.9*  HCT 36.8  < > 36.1  < > 24.0* 25.5* 30.5*  MCV 91.5  < > 93.3  < > 90.9 93.1 97.1  PLT 290  < > 278  < > 153 197 434*  < > = values in this interval not displayed. Cardiac Enzymes:  Recent Labs  06/05/16 0222 06/05/16 0806 06/05/16 1534  TROPONINI 3.12* 2.55* 2.19*   BNP: Invalid input(s): POCBNP CBG:  Recent Labs  06/13/16 2109 06/14/16 0651 06/14/16 1150  GLUCAP 116* 108* 115*    Procedures and Imaging Studies During Stay: Dg Chest 2 View  Result Date: 06/13/2016 CLINICAL DATA:  76 year old female with history of shortness of breath and weakness. Postoperative evaluation. EXAM: CHEST  2 VIEW COMPARISON:  Chest x-ray 06/12/2016. FINDINGS: Previously noted right IJ central venous catheter has been removed. Lung volumes remain low. No consolidative airspace disease. Small bilateral pleural effusions. No evidence of pulmonary edema. Heart size is borderline enlarged. The patient is rotated to the right on today's exam, resulting in distortion of the mediastinal contours and reduced diagnostic sensitivity and specificity for mediastinal pathology. Atherosclerosis in the thoracic aorta. Status post median sternotomy for CABG. IMPRESSION: 1. Postoperative changes and support apparatus, as above. 2. Low lung volumes with small bilateral pleural effusions. Electronically Signed   By: Trudie Reed M.D.   On: 06/13/2016 08:29   [name]  Result Date: 06/05/2016 CLINICAL DATA:  Acute presentation with headache, confusion and speech disturbance. Numbness in the feet. EXAM: MRI HEAD WITHOUT CONTRAST MRA HEAD WITHOUT CONTRAST TECHNIQUE: Multiplanar, multiecho pulse sequences of the  brain and surrounding structures were obtained without intravenous contrast. Angiographic images of the head were obtained using MRA technique without contrast. COMPARISON:  CT 06/04/2016.  MRI 04/18/2016. FINDINGS: MRI HEAD FINDINGS Brain: Diffusion imaging does not show any acute or subacute infarction. The brainstem is normal. There is an old infarction in the inferior cerebellum on the right which is unchanged. Cerebral hemispheres show age related atrophy with moderate chronic small-vessel ischemic changes affecting the deep and subcortical white matter. No mass lesion, hemorrhage, hydrocephalus or extra-axial collection. No visible change since the previous study. Vascular: Major vessels at the base of the brain show flow. Skull and upper cervical spine: Negative Sinuses/Orbits: Clear/ normal. Small amount of mastoid air cell fluid. Other: None significant MRA HEAD FINDINGS Both internal carotid artery's are patent through the skullbase and siphon regions. There is considerable atherosclerotic irregularity in the carotid siphon regions but without suspicion of flow limiting stenosis. Supraclinoid internal carotid arteries are normal. Anterior and middle cerebral arteries are widely patent. Fetal origin left PCA. Left vertebral artery is dominant. Small right vertebral artery show antegrade flow to PICA up. No flow  an the right vertebral artery beyond PICA. Basilar artery shows some atherosclerotic irregularity without flow limiting stenosis. Anterior inferior cerebellar arteries, superior cerebellar arteries and posterior cerebral arteries are patent bilaterally. Left PCA takes fetal origin as noted previously. IMPRESSION: MRI head: No acute finding. 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 Moderate chronic small-vessel ischemic changes affecting the cerebral hemispheres as seen previously. MRA head: No change. Atherosclerotic change in both carotid siphon regions without flow limiting stenosis. No  intracranial large or medium vessel anterior circulation abnormal finding. Small right vertebral artery which gives flow to right PICA but does not contribute to the basilar. Dominant left vertebral artery widely patent to the basilar. Atherosclerotic irregularity of the basilar artery without flow limiting stenosis. Electronically Signed   By: Paulina Fusi M.D.   On: 06/05/2016 07:43   Dg Chest Port 1 View  Result Date: 06/12/2016 CLINICAL DATA:  Atelectasis, shortness of Breath EXAM: PORTABLE CHEST 1 VIEW COMPARISON:  06/11/2016 FINDINGS: Right central line remains in place, unchanged. Prior CABG. Very low lung volumes. No confluent opacities or effusions. Heart is normal size. IMPRESSION: Very low lung volumes.  No active disease. Electronically Signed   By: Charlett Nose M.D.   On: 06/12/2016 07:14   Dg Chest Port 1 View  Result Date: 06/11/2016 CLINICAL DATA:  Atelectasis. EXAM: PORTABLE CHEST 1 VIEW COMPARISON:  06/10/2016 FINDINGS: Patient is rotated to the right. Lungs are hypoinflated. Interval removal of right IJ Swan-Ganz catheter with IJ sheath remaining in place. Minimal prominence of the left perihilar markings likely due to the degree of rotation and hypoinflation. No lobar consolidation, effusion or pneumothorax. Cardiomediastinal silhouette and remainder of the exam is unchanged. IMPRESSION: Hypoinflation without acute cardiopulmonary disease. Right IJ sheath with tip over the SVC. Electronically Signed   By: Elberta Fortis M.D.   On: 06/11/2016 07:48   Dg Chest Port 1 View  Result Date: 06/10/2016 CLINICAL DATA:  Post CABG x4. EXAM: PORTABLE CHEST 1 VIEW COMPARISON:  06/09/2016 FINDINGS: Patient slightly rotated to the right. Sternotomy wires unchanged. Interval removal of nasogastric tube and endotracheal tubes. Right IJ Swan-Ganz catheter has tip over the right main pulmonary artery without significant change. Left-sided chest tube without significant change. Lungs are hypoinflated  without focal airspace consolidation, effusion or pneumothorax. Cardiomediastinal silhouette and remainder of the exam is unchanged. IMPRESSION: Hypoinflation without acute cardiopulmonary disease. Tubes and lines as described. Electronically Signed   By: Elberta Fortis M.D.   On: 06/10/2016 08:28   Dg Chest Port 1 View  Result Date: 06/09/2016 CLINICAL DATA:  Of is Postop CABG. EXAM: PORTABLE CHEST 1 VIEW COMPARISON:  06/04/2016 FINDINGS: Endotracheal tube is just above the carina directed toward the right mainstem bronchus and be retracted 1-2 cm. NG tube tip is in the fundus of the stomach. Swan-Ganz catheter tip is in the right lower lobe. Single left-sided chest tube in place. No pneumothorax. There is slight perihilar haziness consistent with minimal pulmonary edema. No effusions. Pulmonary vascularity is normal. Heart size is normal. IMPRESSION: 1. Minimal perihilar edema. 2. No pneumothorax. 3. Endotracheal tube is just above the right mainstem bronchus and could be retracted 2 cm. Electronically Signed   By: Francene Boyers M.D.   On: 06/09/2016 14:48   Dg Chest Port 1 View  Result Date: 06/04/2016 CLINICAL DATA:  Feet numbness after LEFT carotid artery stenting. Headache and confusion. EXAM: PORTABLE CHEST 1 VIEW COMPARISON:  Chest radiograph Aug 17, 2004 FINDINGS: Cardiomediastinal silhouette is unremarkable for this low  inspiratory examination with crowded vasculature markings. The lungs are clear without pleural effusions or focal consolidations. Trachea projects midline and there is no pneumothorax. Included soft tissue planes and osseous structures are non-suspicious. IMPRESSION: No acute cardiopulmonary process for this low inspiratory portable examination. Electronically Signed   By: Awilda Metro M.D.   On: 06/04/2016 19:21   [name] Head/brain YN Cm  Result Date: 06/05/2016 CLINICAL DATA:  Acute presentation with headache, confusion and speech disturbance. Numbness in the feet. EXAM:  MRI HEAD WITHOUT CONTRAST MRA HEAD WITHOUT CONTRAST TECHNIQUE: Multiplanar, multiecho pulse sequences of the brain and surrounding structures were obtained without intravenous contrast. Angiographic images of the head were obtained using MRA technique without contrast. COMPARISON:  CT 06/04/2016.  MRI 04/18/2016. FINDINGS: MRI HEAD FINDINGS Brain: Diffusion imaging does not show any acute or subacute infarction. The brainstem is normal. There is an old infarction in the inferior cerebellum on the right which is unchanged. Cerebral hemispheres show age related atrophy with moderate chronic small-vessel ischemic changes affecting the deep and subcortical white matter. No mass lesion, hemorrhage, hydrocephalus or extra-axial collection. No visible change since the previous study. Vascular: Major vessels at the base of the brain show flow. Skull and upper cervical spine: Negative Sinuses/Orbits: Clear/ normal. Small amount of mastoid air cell fluid. Other: None significant MRA HEAD FINDINGS Both internal carotid artery's are patent through the skullbase and siphon regions. There is considerable atherosclerotic irregularity in the carotid siphon regions but without suspicion of flow limiting stenosis. Supraclinoid internal carotid arteries are normal. Anterior and middle cerebral arteries are widely patent. Fetal origin left PCA. Left vertebral artery is dominant. Small right vertebral artery show antegrade flow to PICA up. No flow an the right vertebral artery beyond PICA. Basilar artery shows some atherosclerotic irregularity without flow limiting stenosis. Anterior inferior cerebellar arteries, superior cerebellar arteries and posterior cerebral arteries are patent bilaterally. Left PCA takes fetal origin as noted previously. IMPRESSION: MRI head: No acute finding. 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 Moderate chronic small-vessel ischemic changes affecting the cerebral hemispheres as seen previously. MRA  head: No change. Atherosclerotic change in both carotid siphon regions without flow limiting stenosis. No intracranial large or medium vessel anterior circulation abnormal finding. Small right vertebral artery which gives flow to right PICA but does not contribute to the basilar. Dominant left vertebral artery widely patent to the basilar. Atherosclerotic irregularity of the basilar artery without flow limiting stenosis. Electronically Signed   By: Paulina Fusi M.D.   On: 06/05/2016 07:43   Ct Head Code Stroke W/o Cm  Result Date: 06/04/2016 CLINICAL DATA:  Code stroke. Headache, confusion, speech difficulty beginning at 3 p.m. Status post LEFT carotid stent 1 week ago with subsequent foot numbness. History of hypertension, LEFT eye blindness, stroke. EXAM: CT HEAD WITHOUT CONTRAST TECHNIQUE: Contiguous axial images were obtained from the base of the skull through the vertex without intravenous contrast. COMPARISON:  MRI head and CTA HEAD April 18, 2016 FINDINGS: BRAIN: Stable moderate to severe ventriculomegaly on the basis of global parenchymal brain volume loss. Old RIGHT cerebellar infarct. No intraparenchymal hemorrhage, mass effect, midline shift or acute large vascular territory infarct. Moderate to severe stable ventriculomegaly on the basis of global parenchymal brain volume loss. No abnormal extra-axial fluid collections. Basal cisterns are patent. VASCULAR: Moderate calcific atherosclerosis of the carotid siphons. SKULL: No skull fracture. No significant scalp soft tissue swelling. SINUSES/ORBITS: The mastoid air-cells and included paranasal sinuses are well-aerated.Status post LEFT ocular lens implant.  The included ocular globes and orbital contents are non-suspicious. OTHER: None. ASPECTS [facility] Stroke Program Early CT Score) - Ganglionic level infarction (caudate, lentiform nuclei, internal capsule, insula, M1-M3 cortex): 7 - Supraganglionic infarction (M4-M6 cortex): 3 Total score (0-10 with  10 being normal): 10 IMPRESSION: 1. No acute intracranial process. Stable examination including mild to moderate chronic small vessel ischemic disease and old small RIGHT cerebellar infarct. 2. ASPECTS is 10. Critical Value/emergent results were called by telephone at the time of interpretation on 06/04/2016 at 6:57 pm to Dr. Samuel Jester , who verbally acknowledged these results. Electronically Signed   By: Awilda Metro M.D.   On: 06/04/2016 18:59    Assessment/Plan:    #1 status post CABG 4-she continues to do well will need continued PT and OT when she is discharged she will have son  and daughter nearby to help.  Also needs nursing support.  With history of coronary artery disease continues on aspirin 325 mg a day as well as a beta blocker Lopressor 25 mg twice a day.  She has an intolerance to statins.  She will have cardiology follow up scheduled for tomorrow   #2 history of volume overload ejection fraction 45%-she is on Lasix with potassium supplementation-her weight actually has gone down appears about 9 pounds since her admissionhere--we'll update a metabolic panel to ensure stability of renal function and electrolytes.  #3 hyperlipidemia LDL the hospital was 153 she has not tolerated statins well in the past and thus is not on a statin.  #4 history of pre-diabetes hemoglobin A1c was 5.4 the hospital Accu-Cheks here have been in the lower mid 100s.  #5 history of  allergies she is on Allegra this appears to be helping.  #6 history of glaucoma continues on topical eyedrops this followed by ophthalmology. Does have a history of blindness of her left eye as well  #7 constipation apparently this has improved she is on MiraLAX twice a day.  Number 8--history of anxiety she is on Xanax low-dose 0.125 mg 3 times a day when necessary this at this point appears well controlled- #9 History of anemia thought to be postop this has improved with hemoglobin 9.9 on lab done on  06/17/2016-we will update this before discharge  #10-history of left carotid endarterectomy --has seen vascular and thought to be doing well  #11 hypertension this appears stable on lisinopril as well as Lopressor  Again she will be going home with support from her son and daughter all of she does live by herself-her family is quite supportive she will need a rolling walker for ambulation secondary to some continued weakness.  Also would benefit from continued PT and OT-also will need nursing support for multiple issues  [id number] no greater than 30 minutes spent preparing this discharge summary-greater than 50% of time spent coordinating plan of care for  numerous diagnoses

## 2016-06-30 ENCOUNTER — Encounter: Payer: Self-pay | Admitting: Adult Health

## 2016-06-30 ENCOUNTER — Ambulatory Visit (INDEPENDENT_AMBULATORY_CARE_PROVIDER_SITE_OTHER): Payer: Medicare HMO | Admitting: Adult Health

## 2016-06-30 ENCOUNTER — Encounter (HOSPITAL_COMMUNITY)
Admission: AD | Admit: 2016-06-30 | Discharge: 2016-06-30 | Disposition: A | Discharge status: Home / Self Care | Payer: Medicare HMO | Source: Other Acute Inpatient Hospital | Attending: Internal Medicine | Admitting: Internal Medicine

## 2016-06-30 VITALS — BP 108/62 | HR 76 | Ht <= 58 in | Wt 176.0 lb

## 2016-06-30 DIAGNOSIS — E7800 Pure hypercholesterolemia, unspecified: Secondary | ICD-10-CM

## 2016-06-30 DIAGNOSIS — Z48812 Encounter for surgical aftercare following surgery on the circulatory system: Secondary | ICD-10-CM | POA: Insufficient documentation

## 2016-06-30 DIAGNOSIS — I214 Non-ST elevation (NSTEMI) myocardial infarction: Secondary | ICD-10-CM | POA: Diagnosis not present

## 2016-06-30 DIAGNOSIS — I2510 Atherosclerotic heart disease of native coronary artery without angina pectoris: Secondary | ICD-10-CM

## 2016-06-30 DIAGNOSIS — I10 Essential (primary) hypertension: Secondary | ICD-10-CM

## 2016-06-30 LAB — CBC WITH DIFFERENTIAL/PLATELET
Basophils Absolute: 0.1 10*3/uL (ref 0.0–0.1)
Basophils Relative: 1 %
Eosinophils Absolute: 0.7 10*3/uL (ref 0.0–0.7)
Eosinophils Relative: 12 %
HEMATOCRIT: 38.9 % (ref 36.0–46.0)
HEMOGLOBIN: 12.4 g/dL (ref 12.0–15.0)
Lymphocytes Relative: 45 %
Lymphs Abs: 2.7 10*3/uL (ref 0.7–4.0)
MCH: 30.8 pg (ref 26.0–34.0)
MCHC: 31.9 g/dL (ref 30.0–36.0)
MCV: 96.5 fL (ref 78.0–100.0)
Monocytes Absolute: 0.4 10*3/uL (ref 0.1–1.0)
Monocytes Relative: 6 %
NEUTROS ABS: 2.2 10*3/uL (ref 1.7–7.7)
NEUTROS PCT: 36 %
Platelets: 456 10*3/uL — ABNORMAL HIGH (ref 150–400)
RBC: 4.03 MIL/uL (ref 3.87–5.11)
RDW: 14.3 % (ref 11.5–15.5)
WBC: 6 10*3/uL (ref 4.0–10.5)

## 2016-06-30 LAB — BASIC METABOLIC PANEL
Anion gap: 8 (ref 5–15)
BUN: 12 mg/dL (ref 6–20)
CHLORIDE: 101 mmol/L (ref 101–111)
CO2: 29 mmol/L (ref 22–32)
Calcium: 9.6 mg/dL (ref 8.9–10.3)
Creatinine, Ser: 0.86 mg/dL (ref 0.44–1.00)
GFR calc non Af Amer: >60 mL/min (ref >60)
Glucose, Bld: 117 mg/dL — ABNORMAL HIGH (ref 65–99)
POTASSIUM: 4 mmol/L (ref 3.5–5.1)
Sodium: 138 mmol/L (ref 135–145)

## 2016-06-30 MED ORDER — POTASSIUM CHLORIDE CRYS ER 20 MEQ PO TBCR: 20.0000 meq | EXTENDED_RELEASE_TABLET | Freq: Every day | ORAL | 3 refills | Status: DC

## 2016-06-30 MED ORDER — METOPROLOL TARTRATE 25 MG PO TABS: 25.0000 mg | ORAL_TABLET | Freq: Two times a day (BID) | ORAL | 3 refills | Status: DC

## 2016-06-30 MED ORDER — FUROSEMIDE 40 MG PO TABS: 40.0000 mg | ORAL_TABLET | Freq: Every day | ORAL | 3 refills | Status: DC

## 2016-06-30 MED ORDER — LISINOPRIL 2.5 MG PO TABS: 2.5000 mg | ORAL_TABLET | Freq: Every day | ORAL | 3 refills | Status: DC

## 2016-06-30 MED ORDER — ESOMEPRAZOLE MAGNESIUM 20 MG PO CPDR: 20.0000 mg | DELAYED_RELEASE_CAPSULE | Freq: Every day | ORAL | 3 refills | Status: DC

## 2016-06-30 NOTE — Progress Notes (Signed)
Name: Sheryl Carter    DOB: 14-Oct-1940  Age: 76 y.o.  MR#: [id number]       PCP:  Cassell Smiles, MD      Insurance: Payor: [email or web address] MEDICARE / Plan: [employer] MEDICARE HMO / Product Type: *No Product type* /   CC:    Chief Complaint  Patient presents with  . Hospitalization Follow-up    CABG  . Hypertension    VS Vitals:   06/30/16 1414  BP: 108/62  Pulse: 76  SpO2: 97%  Weight: 176 lb (79.8 kg)  Height: 4' 7.5" (1.41 m)    Weights Current Weight  06/30/16 176 lb (79.8 kg)  06/29/16 178 lb 12.8 oz (81.1 kg)  06/20/16 188 lb (85.3 kg)    Blood Pressure  BP Readings from Last 3 Encounters:  06/30/16 108/62  06/29/16 121/69  06/26/16 114/65     Admit date:  (Not on file) Last encounter with RMR:  Visit date not found   Allergy Other; Prednisone; Statins; and Erythromycin  No current outpatient prescriptions on file.   No current facility-administered medications for this visit.     Discontinued Meds:   There are no discontinued medications.  Patient Active Problem List   Diagnosis Date Noted  . Constipation 06/26/2016  . 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 Coronary artery disease   . NSTEMI (non-ST elevated myocardial infarction) (HCC) 06/07/2016  . Paresthesia   . TIA (transient ischemic attack) 06/04/2016  . Symptomatic carotid artery stenosis, left 05/29/2016  . Slurred speech 04/18/2016  . Hypertension, essential 04/18/2016  . Hyperlipidemia 04/18/2016  . Chronic anxiety 04/18/2016    LABS    Component Value Date/Time   NA 138 06/30/2016 0700   NA 137 06/17/2016 0750   NA 139 06/13/2016 0252   K 4.0 06/30/2016 0700   K 4.5 06/17/2016 0750   K 3.5 06/13/2016 0252   CL 101 06/30/2016 0700   CL 100 (L) 06/17/2016 0750   CL 102 06/13/2016 0252   CO2 29 06/30/2016 0700   CO2 28 06/17/2016 0750   CO2 29 06/13/2016 0252   GLUCOSE 117 (H) 06/30/2016 0700   GLUCOSE 93 06/17/2016 0750   GLUCOSE 105 (H) 06/13/2016 0252   BUN 12 06/30/2016 0700   BUN 8  06/17/2016 0750   BUN 9 06/13/2016 0252   CREATININE 0.86 06/30/2016 0700   CREATININE 0.68 06/17/2016 0750   CREATININE 0.78 06/13/2016 0252   CALCIUM 9.6 06/30/2016 0700   CALCIUM 8.9 06/17/2016 0750   CALCIUM 8.6 (L) 06/13/2016 0252   GFRNONAA >60 06/30/2016 0700   GFRNONAA >60 06/17/2016 0750   GFRNONAA >60 06/13/2016 0252   GFRAA >60 06/30/2016 0700   GFRAA >60 06/17/2016 0750   GFRAA >60 06/13/2016 0252   CMP     Component Value Date/Time   NA 138 06/30/2016 0700   K 4.0 06/30/2016 0700   CL 101 06/30/2016 0700   CO2 29 06/30/2016 0700   GLUCOSE 117 (H) 06/30/2016 0700   BUN 12 06/30/2016 0700   CREATININE 0.86 06/30/2016 0700   CALCIUM 9.6 06/30/2016 0700   PROT 5.3 (L) 06/08/2016 0258   ALBUMIN 2.8 (L) 06/08/2016 0258   AST 20 06/08/2016 0258   ALT 13 (L) 06/08/2016 0258   ALKPHOS 51 06/08/2016 0258   BILITOT 0.7 06/08/2016 0258   GFRNONAA >60 06/30/2016 0700   GFRAA >60 06/30/2016 0700       Component Value Date/Time   WBC 6.0 06/30/2016 0700  WBC 9.9 06/17/2016 0750   WBC 9.9 06/13/2016 0252   HGB 12.4 06/30/2016 0700   HGB 9.9 (L) 06/17/2016 0750   HGB 8.3 (L) 06/13/2016 0252   HCT 38.9 06/30/2016 0700   HCT 30.5 (L) 06/17/2016 0750   HCT 25.5 (L) 06/13/2016 0252   MCV 96.5 06/30/2016 0700   MCV 97.1 06/17/2016 0750   MCV 93.1 06/13/2016 0252    Lipid Panel     Component Value Date/Time   CHOL 216 (H) 06/05/2016 0222   TRIG 128 06/05/2016 0222   HDL 36 (L) 06/05/2016 0222   CHOLHDL 6.0 06/05/2016 0222   VLDL 26 06/05/2016 0222   LDLCALC 154 (H) 06/05/2016 0222    ABG    Component Value Date/Time   PHART 7.320 (L) 06/09/2016 2312   PCO2ART 44.4 06/09/2016 2312   PO2ART 76.0 (L) 06/09/2016 2312   HCO3 22.9 06/09/2016 2312   TCO2 23 06/10/2016 1834   ACIDBASEDEF 3.0 (H) 06/09/2016 2312   O2SAT 94.0 06/09/2016 2312     No results found for: TSH BNP (last 3 results) No results for input(s): BNP in the last 8760 hours.  ProBNP (last  3 results) No results for input(s): PROBNP in the last 8760 hours.  Cardiac Panel (last 3 results) No results for input(s): CKTOTAL, CKMB, TROPONINI, RELINDX in the last 72 hours.  Iron/TIBC/Ferritin/ %Sat No results found for: IRON, TIBC, FERRITIN, IRONPCTSAT   EKG Orders placed or performed during the hospital encounter of 06/04/16  . EKG 12-Lead  . EKG 12-Lead  . EKG 12-Lead  . EKG 12-Lead  . EKG 12-Lead  . EKG 12-Lead     Prior Assessment and Plan Problem List as of 06/30/2016 Reviewed: 06/30/2016  2:14 PM by Joni Reining, NP     Cardiovascular and Mediastinum   Hypertension, essential   Symptomatic carotid artery stenosis, left   TIA (transient ischemic attack)   NSTEMI (non-ST elevated myocardial infarction) [facility])   Coronary artery disease     Digestive   Constipation     Other   Slurred speech   Hyperlipidemia   Chronic anxiety   Paresthesia   S/P CABG x 4       Imaging: Dg Chest 2 View  Result Date: 06/13/2016 CLINICAL DATA:  76 year old female with history of shortness of breath and weakness. Postoperative evaluation. EXAM: CHEST  2 VIEW COMPARISON:  Chest x-ray 06/12/2016. FINDINGS: Previously noted right IJ central venous catheter has been removed. Lung volumes remain low. No consolidative airspace disease. Small bilateral pleural effusions. No evidence of pulmonary edema. Heart size is borderline enlarged. The patient is rotated to the right on today's exam, resulting in distortion of the mediastinal contours and reduced diagnostic sensitivity and specificity for mediastinal pathology. Atherosclerosis in the thoracic aorta. Status post median sternotomy for CABG. IMPRESSION: 1. Postoperative changes and support apparatus, as above. 2. Low lung volumes with small bilateral pleural effusions. Electronically Signed   By: Trudie Reed M.D.   On: 06/13/2016 08:29   [name]  Result Date: 06/05/2016 CLINICAL DATA:  Acute presentation with headache,  confusion and speech disturbance. Numbness in the feet. EXAM: MRI HEAD WITHOUT CONTRAST MRA HEAD WITHOUT CONTRAST TECHNIQUE: Multiplanar, multiecho pulse sequences of the brain and surrounding structures were obtained without intravenous contrast. Angiographic images of the head were obtained using MRA technique without contrast. COMPARISON:  CT 06/04/2016.  MRI 04/18/2016. FINDINGS: MRI HEAD FINDINGS Brain: Diffusion imaging does not show any acute or subacute infarction. The  brainstem is normal. There is an old infarction in the inferior cerebellum on the right which is unchanged. Cerebral hemispheres show age related atrophy with moderate chronic small-vessel ischemic changes affecting the deep and subcortical white matter. No mass lesion, hemorrhage, hydrocephalus or extra-axial collection. No visible change since the previous study. Vascular: Major vessels at the base of the brain show flow. Skull and upper cervical spine: Negative Sinuses/Orbits: Clear/ normal. Small amount of mastoid air cell fluid. Other: None significant MRA HEAD FINDINGS Both internal carotid artery's are patent through the skullbase and siphon regions. There is considerable atherosclerotic irregularity in the carotid siphon regions but without suspicion of flow limiting stenosis. Supraclinoid internal carotid arteries are normal. Anterior and middle cerebral arteries are widely patent. Fetal origin left PCA. Left vertebral artery is dominant. Small right vertebral artery show antegrade flow to PICA up. No flow an the right vertebral artery beyond PICA. Basilar artery shows some atherosclerotic irregularity without flow limiting stenosis. Anterior inferior cerebellar arteries, superior cerebellar arteries and posterior cerebral arteries are patent bilaterally. Left PCA takes fetal origin as noted previously. IMPRESSION: MRI head: No acute finding. 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 Moderate chronic small-vessel ischemic  changes affecting the cerebral hemispheres as seen previously. MRA head: No change. Atherosclerotic change in both carotid siphon regions without flow limiting stenosis. No intracranial large or medium vessel anterior circulation abnormal finding. Small right vertebral artery which gives flow to right PICA but does not contribute to the basilar. Dominant left vertebral artery widely patent to the basilar. Atherosclerotic irregularity of the basilar artery without flow limiting stenosis. Electronically Signed   By: Paulina Fusi M.D.   On: 06/05/2016 07:43   Dg Chest Port 1 View  Result Date: 06/12/2016 CLINICAL DATA:  Atelectasis, shortness of Breath EXAM: PORTABLE CHEST 1 VIEW COMPARISON:  06/11/2016 FINDINGS: Right central line remains in place, unchanged. Prior CABG. Very low lung volumes. No confluent opacities or effusions. Heart is normal size. IMPRESSION: Very low lung volumes.  No active disease. Electronically Signed   By: Charlett Nose M.D.   On: 06/12/2016 07:14   Dg Chest Port 1 View  Result Date: 06/11/2016 CLINICAL DATA:  Atelectasis. EXAM: PORTABLE CHEST 1 VIEW COMPARISON:  06/10/2016 FINDINGS: Patient is rotated to the right. Lungs are hypoinflated. Interval removal of right IJ Swan-Ganz catheter with IJ sheath remaining in place. Minimal prominence of the left perihilar markings likely due to the degree of rotation and hypoinflation. No lobar consolidation, effusion or pneumothorax. Cardiomediastinal silhouette and remainder of the exam is unchanged. IMPRESSION: Hypoinflation without acute cardiopulmonary disease. Right IJ sheath with tip over the SVC. Electronically Signed   By: Elberta Fortis M.D.   On: 06/11/2016 07:48   Dg Chest Port 1 View  Result Date: 06/10/2016 CLINICAL DATA:  Post CABG x4. EXAM: PORTABLE CHEST 1 VIEW COMPARISON:  06/09/2016 FINDINGS: Patient slightly rotated to the right. Sternotomy wires unchanged. Interval removal of nasogastric tube and endotracheal tubes. Right  IJ Swan-Ganz catheter has tip over the right main pulmonary artery without significant change. Left-sided chest tube without significant change. Lungs are hypoinflated without focal airspace consolidation, effusion or pneumothorax. Cardiomediastinal silhouette and remainder of the exam is unchanged. IMPRESSION: Hypoinflation without acute cardiopulmonary disease. Tubes and lines as described. Electronically Signed   By: Elberta Fortis M.D.   On: 06/10/2016 08:28   Dg Chest Port 1 View  Result Date: 06/09/2016 CLINICAL DATA:  Of is Postop CABG. EXAM: PORTABLE CHEST 1 VIEW COMPARISON:  06/04/2016 FINDINGS: Endotracheal tube is just above the carina directed toward the right mainstem bronchus and be retracted 1-2 cm. NG tube tip is in the fundus of the stomach. Swan-Ganz catheter tip is in the right lower lobe. Single left-sided chest tube in place. No pneumothorax. There is slight perihilar haziness consistent with minimal pulmonary edema. No effusions. Pulmonary vascularity is normal. Heart size is normal. IMPRESSION: 1. Minimal perihilar edema. 2. No pneumothorax. 3. Endotracheal tube is just above the right mainstem bronchus and could be retracted 2 cm. Electronically Signed   By: Francene Boyers M.D.   On: 06/09/2016 14:48   Dg Chest Port 1 View  Result Date: 06/04/2016 CLINICAL DATA:  Feet numbness after LEFT carotid artery stenting. Headache and confusion. EXAM: PORTABLE CHEST 1 VIEW COMPARISON:  Chest radiograph Aug 17, 2004 FINDINGS: Cardiomediastinal silhouette is unremarkable for this low inspiratory examination with crowded vasculature markings. The lungs are clear without pleural effusions or focal consolidations. Trachea projects midline and there is no pneumothorax. Included soft tissue planes and osseous structures are non-suspicious. IMPRESSION: No acute cardiopulmonary process for this low inspiratory portable examination. Electronically Signed   By: Awilda Metro M.D.   On: 06/04/2016 19:21    [name] Head/brain ZO Cm  Result Date: 06/05/2016 CLINICAL DATA:  Acute presentation with headache, confusion and speech disturbance. Numbness in the feet. EXAM: MRI HEAD WITHOUT CONTRAST MRA HEAD WITHOUT CONTRAST TECHNIQUE: Multiplanar, multiecho pulse sequences of the brain and surrounding structures were obtained without intravenous contrast. Angiographic images of the head were obtained using MRA technique without contrast. COMPARISON:  CT 06/04/2016.  MRI 04/18/2016. FINDINGS: MRI HEAD FINDINGS Brain: Diffusion imaging does not show any acute or subacute infarction. The brainstem is normal. There is an old infarction in the inferior cerebellum on the right which is unchanged. Cerebral hemispheres show age related atrophy with moderate chronic small-vessel ischemic changes affecting the deep and subcortical white matter. No mass lesion, hemorrhage, hydrocephalus or extra-axial collection. No visible change since the previous study. Vascular: Major vessels at the base of the brain show flow. Skull and upper cervical spine: Negative Sinuses/Orbits: Clear/ normal. Small amount of mastoid air cell fluid. Other: None significant MRA HEAD FINDINGS Both internal carotid artery's are patent through the skullbase and siphon regions. There is considerable atherosclerotic irregularity in the carotid siphon regions but without suspicion of flow limiting stenosis. Supraclinoid internal carotid arteries are normal. Anterior and middle cerebral arteries are widely patent. Fetal origin left PCA. Left vertebral artery is dominant. Small right vertebral artery show antegrade flow to PICA up. No flow an the right vertebral artery beyond PICA. Basilar artery shows some atherosclerotic irregularity without flow limiting stenosis. Anterior inferior cerebellar arteries, superior cerebellar arteries and posterior cerebral arteries are patent bilaterally. Left PCA takes fetal origin as noted previously. IMPRESSION: MRI head: No  acute finding. 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 Moderate chronic small-vessel ischemic changes affecting the cerebral hemispheres as seen previously. MRA head: No change. Atherosclerotic change in both carotid siphon regions without flow limiting stenosis. No intracranial large or medium vessel anterior circulation abnormal finding. Small right vertebral artery which gives flow to right PICA but does not contribute to the basilar. Dominant left vertebral artery widely patent to the basilar. Atherosclerotic irregularity of the basilar artery without flow limiting stenosis. Electronically Signed   By: Paulina Fusi M.D.   On: 06/05/2016 07:43   Ct Head Code Stroke W/o Cm  Result Date: 06/04/2016 CLINICAL DATA:  Code stroke.  Headache, confusion, speech difficulty beginning at 3 p.m. Status post LEFT carotid stent 1 week ago with subsequent foot numbness. History of hypertension, LEFT eye blindness, stroke. EXAM: CT HEAD WITHOUT CONTRAST TECHNIQUE: Contiguous axial images were obtained from the base of the skull through the vertex without intravenous contrast. COMPARISON:  MRI head and CTA HEAD April 18, 2016 FINDINGS: BRAIN: Stable moderate to severe ventriculomegaly on the basis of global parenchymal brain volume loss. Old RIGHT cerebellar infarct. No intraparenchymal hemorrhage, mass effect, midline shift or acute large vascular territory infarct. Moderate to severe stable ventriculomegaly on the basis of global parenchymal brain volume loss. No abnormal extra-axial fluid collections. Basal cisterns are patent. VASCULAR: Moderate calcific atherosclerosis of the carotid siphons. SKULL: No skull fracture. No significant scalp soft tissue swelling. SINUSES/ORBITS: The mastoid air-cells and included paranasal sinuses are well-aerated.Status post LEFT ocular lens implant. The included ocular globes and orbital contents are non-suspicious. OTHER: None. ASPECTS [facility] Stroke Program Early CT Score) -  Ganglionic level infarction (caudate, lentiform nuclei, internal capsule, insula, M1-M3 cortex): 7 - Supraganglionic infarction (M4-M6 cortex): 3 Total score (0-10 with 10 being normal): 10 IMPRESSION: 1. No acute intracranial process. Stable examination including mild to moderate chronic small vessel ischemic disease and old small RIGHT cerebellar infarct. 2. ASPECTS is 10. Critical Value/emergent results were called by telephone at the time of interpretation on 06/04/2016 at 6:57 pm to Dr. Samuel Jester , who verbally acknowledged these results. Electronically Signed   By: Awilda Metro M.D.   On: 06/04/2016 18:59

## 2016-06-30 NOTE — Progress Notes (Signed)
Cardiology Office Note   Date:  06/30/2016   ID:  Sheryl, Carter 1941/02/02, MRN [id number]  PCP:  Cassell Smiles, MD  Cardiologist:  Establish in [place]/ Joni Reining, NP   Chief Complaint  Patient presents with  . Hospitalization Follow-up    CABG  . Hypertension      History of Present Illness: Sheryl Carter is a 76 y.o. female who presents for posthospitalization follow-up in the setting of coronary artery disease, status post CABG 4 using LIMA to LAD bilateral thigh greater saphenous vein graft harvest, SVG to diagonal 2, SVG to OM, SVG to RCA. This was completed after patient had cardiac catheterization completed on 06/07/2016 after presenting to [facility] emergency room on 06/04/2016 with complaints of slowed speech and was ruled out for CVA. She was found to have elevated troponin of 3.32, and was seen by cardiology at that time who recommended transfer.   Conclusion     Prox RCA lesion, 80 %stenosed.  Mid RCA lesion, 100 %stenosed.  Ost Cx to Prox Cx lesion, 90 %stenosed.  Ost LAD lesion, 90 %stenosed.  LM lesion, 80 %stenosed.  Prox LAD lesion, 90 %stenosed.  Ost 1st Diag to 1st Diag lesion, 80 %stenosed.  Ost 2nd Mrg to 2nd Mrg lesion, 60 %stenosed.  LV end diastolic pressure is moderately elevated.   1. Severe three-vessel and distal left main disease. 2. Moderately elevated left ventricular end-diastolic pressure. 3. Left ventricular angiography was not performed. EF was 50-55% by echo.   Post operative the patient required insulin drip due to hyperglycemia home diabetic medications were adjusted. On discharge the patient was sent to skilled nursing facility due to significant deconditioning, to help with ambulation and ongoing recovery. She will need to be established with cardiologist in [place].  She comes today with her son with multiple questions for her postoperative care, medications, activity, and sensation she is  feeling since being discharged. She is going to be leaving the [facility] posthospitalization physical therapy and returning home this afternoon. She denies any serious pain, bleeding, fever or infective type symptoms. She is anxious to return to normal life.  Past Medical History:  Diagnosis Date  . Anxiety   . Blindness of left eye   . Complication of anesthesia    has gas trapped in her body after  . DVT (deep venous thrombosis) (HCC)   . Ear problems   . Family history of adverse reaction to anesthesia    daughter has gas trapped in her body postop as well  . Glaucoma   . Hypertension   . Pre-diabetes    pt. told by [facility] staff that she is prediabetic, HgbA1c was 5.4  . Stroke (HCC)    having TIA's    Past Surgical History:  Procedure Laterality Date  . BREAST SURGERY Left    cyst- L breast  . CHOLECYSTECTOMY    . CORONARY ARTERY BYPASS GRAFT N/A 06/09/2016   Procedure: CORONARY ARTERY BYPASS GRAFTING (CABG) times 4 using the left internal mammary artery and bilateral thigh greater saphenous veins harvested endoscopically. LIMA to LAD, SVG to DIAGONAL 2, SVG to OM, SVG to RCA;  Surgeon: Delight Ovens, MD;  Location: [facility] OR;  Service: Open Heart Surgery;  Laterality: N/A;  . ENDARTERECTOMY Left 05/29/2016   Procedure: LEFT CAROTID ENDARTERECTOMY WITH PATCH ANGIOPLASTY;  Surgeon: Larina Earthly, MD;  Location: [facility] OR;  Service: Vascular;  Laterality: Left;  . EYE SURGERY    .  LEFT HEART CATH AND CORONARY ANGIOGRAPHY N/A 06/07/2016   Procedure: Left Heart Cath and Coronary Angiography;  Surgeon: Iran Ouch, MD;  Location: [facility] INVASIVE CV LAB;  Service: Cardiovascular;  Laterality: N/A;  . TEE WITHOUT CARDIOVERSION N/A 06/09/2016   Procedure: TRANSESOPHAGEAL ECHOCARDIOGRAM (TEE);  Surgeon: Delight Ovens, MD;  Location: [facility] OR;  Service: Open Heart Surgery;  Laterality: N/A;     Current Outpatient Prescriptions  Medication Sig Dispense Refill  . esomeprazole (NEXIUM 24HR) 20  MG capsule Take 1 capsule (20 mg total) by mouth at bedtime. 30 capsule 3  . furosemide (LASIX) 40 MG tablet Take 1 tablet (40 mg total) by mouth daily. 30 tablet 3  . lisinopril (PRINIVIL,ZESTRIL) 2.5 MG tablet Take 1 tablet (2.5 mg total) by mouth daily. 30 tablet 3  . metoprolol tartrate (LOPRESSOR) 25 MG tablet Take 1 tablet (25 mg total) by mouth 2 (two) times daily. 60 tablet 3  . potassium chloride SA (K-DUR,KLOR-CON) 20 MEQ tablet Take 1 tablet (20 mEq total) by mouth daily. 30 tablet 3   No current facility-administered medications for this visit.     Allergies:   Other; Prednisone; Statins; and Erythromycin    Social History:  The patient  reports that she has never smoked. She has never used smokeless tobacco. She reports that she does not drink alcohol or use drugs.   Family History:  The patient's family history includes Heart disease in her sister and son.    ROS: All other systems are reviewed and negative. Unless otherwise mentioned in H&P    PHYSICAL EXAM: VS:  BP 108/62   Pulse 76   Ht 4' 7.5" (1.41 m)   Wt 176 lb (79.8 kg)   SpO2 97%   BMI 40.17 kg/m  , BMI Body mass index is 40.17 kg/m. GEN: Well nourished, well developed, in no acute distress  HEENT: normal  Neck: no JVD, carotid bruits, or masses Cardiac: RRR; no murmurs, rubs, or gallops,no edema  Respiratory:  clear to auscultation bilaterally, normal work of breathing GI: soft, nontender, nondistended, + BS MS: no deformity or atrophy Well healed sternotomy incision. Saphenous vein graft sites on bilateral lower extremities are well healed without evidence of infection or bleeding Skin: warm and dry, no rash Neuro:  Strength and sensation are intact Psych: euthymic mood, full affect  Recent Labs: 06/08/2016: ALT 13 06/10/2016: Magnesium 2.1 06/30/2016: BUN 12; Creatinine, Ser 0.86; Hemoglobin 12.4; Platelets 456; Potassium 4.0; Sodium 138    Lipid Panel    Component Value Date/Time   CHOL 216 (H)  06/05/2016 0222   TRIG 128 06/05/2016 0222   HDL 36 (L) 06/05/2016 0222   CHOLHDL 6.0 06/05/2016 0222   VLDL 26 06/05/2016 0222   LDLCALC 154 (H) 06/05/2016 0222      Wt Readings from Last 3 Encounters:  06/30/16 176 lb (79.8 kg)  06/29/16 178 lb 12.8 oz (81.1 kg)  06/20/16 188 lb (85.3 kg)     ASSESSMENT AND PLAN:  1. Coronary artery disease: Status post coronary artery bypass grafting. I spent approximately 30 minutes with this patient and her son answering multiple questions concerning her medications, postoperative course, examination, and planned discussion for rehabilitation. I reviewed each of her medications with the patient and her son. She will need to be established with a local cardiologist and I have given her the names of the physicians who are here. They will make a decision and let us know who they choose. In the interim  I will not make any medication adjustments. She is intolerant to statins and therefore fish oil has been suggested.  2. Hypertension: Blood pressure is well controlled currently. In fact it is mildly low. May need to adjust medications down if she becomes dizzy. She has not been very active. Once she is cleared by cardiovascular surgeon will send her on to cardiac rehabilitation where her activity will be increased and will be able to follow her blood pressure closely. They know to call us if her blood pressure becomes low. A new prescription for a blood pressure cuff is provided. They're instructed take the blood pressure no more than twice a day.  3. Hypercholesterolemia: She is intolerant to statin therapy. Fish oil has been recommended.  Current medicines are reviewed at length with the patient today.    Labs/ tests ordered today include:   Orders Placed This Encounter  Procedures  . AMB referral to cardiac rehabilitation     Disposition:   FU with 3 months unless symptomatic. She will need to be established with a cardiologist  locally.  Signed, Joni Reining, NP  06/30/2016 3:20 PM    [facility] [address], [place], [place] [address] Phone: [phone]; Fax: [phone]

## 2016-06-30 NOTE — Patient Instructions (Signed)
Medication Instructions:  Your physician recommends that you continue on your current medications as directed. Please refer to the Current Medication list given to you today.   Labwork: NONE  Testing/Procedures: NONE  Follow-Up: Your physician recommends that you schedule a follow-up appointment in: 3 MONTHS.    Any Other Special Instructions Will Be Listed Below (If Applicable).  WE HAVE GIVEN YOU A PRESCRIPTION FOR A BP MACHINE   You have been referred to CARDIAC REHAB     If you need a refill on your cardiac medications before your next appointment, please call your pharmacy.

## 2016-07-04 DIAGNOSIS — Z48812 Encounter for surgical aftercare following surgery on the circulatory system: Secondary | ICD-10-CM | POA: Diagnosis not present

## 2016-07-04 DIAGNOSIS — H409 Unspecified glaucoma: Secondary | ICD-10-CM | POA: Diagnosis not present

## 2016-07-04 DIAGNOSIS — Z8673 Personal history of transient ischemic attack (TIA), and cerebral infarction without residual deficits: Secondary | ICD-10-CM | POA: Diagnosis not present

## 2016-07-04 DIAGNOSIS — I214 Non-ST elevation (NSTEMI) myocardial infarction: Secondary | ICD-10-CM | POA: Diagnosis not present

## 2016-07-04 DIAGNOSIS — I2510 Atherosclerotic heart disease of native coronary artery without angina pectoris: Secondary | ICD-10-CM | POA: Diagnosis not present

## 2016-07-04 DIAGNOSIS — F419 Anxiety disorder, unspecified: Secondary | ICD-10-CM | POA: Diagnosis not present

## 2016-07-04 DIAGNOSIS — H547 Unspecified visual loss: Secondary | ICD-10-CM | POA: Diagnosis not present

## 2016-07-04 DIAGNOSIS — R7303 Prediabetes: Secondary | ICD-10-CM | POA: Diagnosis not present

## 2016-07-04 DIAGNOSIS — I10 Essential (primary) hypertension: Secondary | ICD-10-CM | POA: Diagnosis not present

## 2016-07-05 DIAGNOSIS — R7303 Prediabetes: Secondary | ICD-10-CM | POA: Diagnosis not present

## 2016-07-05 DIAGNOSIS — F419 Anxiety disorder, unspecified: Secondary | ICD-10-CM | POA: Diagnosis not present

## 2016-07-05 DIAGNOSIS — Z48812 Encounter for surgical aftercare following surgery on the circulatory system: Secondary | ICD-10-CM | POA: Diagnosis not present

## 2016-07-05 DIAGNOSIS — Z8673 Personal history of transient ischemic attack (TIA), and cerebral infarction without residual deficits: Secondary | ICD-10-CM | POA: Diagnosis not present

## 2016-07-05 DIAGNOSIS — H409 Unspecified glaucoma: Secondary | ICD-10-CM | POA: Diagnosis not present

## 2016-07-05 DIAGNOSIS — I214 Non-ST elevation (NSTEMI) myocardial infarction: Secondary | ICD-10-CM | POA: Diagnosis not present

## 2016-07-05 DIAGNOSIS — H547 Unspecified visual loss: Secondary | ICD-10-CM | POA: Diagnosis not present

## 2016-07-05 DIAGNOSIS — I2510 Atherosclerotic heart disease of native coronary artery without angina pectoris: Secondary | ICD-10-CM | POA: Diagnosis not present

## 2016-07-05 DIAGNOSIS — I10 Essential (primary) hypertension: Secondary | ICD-10-CM | POA: Diagnosis not present

## 2016-07-06 DIAGNOSIS — I779 Disorder of arteries and arterioles, unspecified: Secondary | ICD-10-CM | POA: Diagnosis not present

## 2016-07-06 DIAGNOSIS — I739 Peripheral vascular disease, unspecified: Secondary | ICD-10-CM | POA: Diagnosis not present

## 2016-07-06 DIAGNOSIS — I214 Non-ST elevation (NSTEMI) myocardial infarction: Secondary | ICD-10-CM | POA: Diagnosis not present

## 2016-07-06 DIAGNOSIS — H547 Unspecified visual loss: Secondary | ICD-10-CM | POA: Diagnosis not present

## 2016-07-06 DIAGNOSIS — I10 Essential (primary) hypertension: Secondary | ICD-10-CM | POA: Diagnosis not present

## 2016-07-06 DIAGNOSIS — F419 Anxiety disorder, unspecified: Secondary | ICD-10-CM | POA: Diagnosis not present

## 2016-07-06 DIAGNOSIS — R7303 Prediabetes: Secondary | ICD-10-CM | POA: Diagnosis not present

## 2016-07-06 DIAGNOSIS — Z8673 Personal history of transient ischemic attack (TIA), and cerebral infarction without residual deficits: Secondary | ICD-10-CM | POA: Diagnosis not present

## 2016-07-06 DIAGNOSIS — E782 Mixed hyperlipidemia: Secondary | ICD-10-CM | POA: Diagnosis not present

## 2016-07-06 DIAGNOSIS — Z6837 Body mass index (BMI) 37.0-37.9, adult: Secondary | ICD-10-CM | POA: Diagnosis not present

## 2016-07-06 DIAGNOSIS — H409 Unspecified glaucoma: Secondary | ICD-10-CM | POA: Diagnosis not present

## 2016-07-06 DIAGNOSIS — I2510 Atherosclerotic heart disease of native coronary artery without angina pectoris: Secondary | ICD-10-CM | POA: Diagnosis not present

## 2016-07-06 DIAGNOSIS — Z48812 Encounter for surgical aftercare following surgery on the circulatory system: Secondary | ICD-10-CM | POA: Diagnosis not present

## 2016-07-07 DIAGNOSIS — I214 Non-ST elevation (NSTEMI) myocardial infarction: Secondary | ICD-10-CM | POA: Diagnosis not present

## 2016-07-07 DIAGNOSIS — F419 Anxiety disorder, unspecified: Secondary | ICD-10-CM | POA: Diagnosis not present

## 2016-07-07 DIAGNOSIS — I10 Essential (primary) hypertension: Secondary | ICD-10-CM | POA: Diagnosis not present

## 2016-07-07 DIAGNOSIS — H547 Unspecified visual loss: Secondary | ICD-10-CM | POA: Diagnosis not present

## 2016-07-07 DIAGNOSIS — Z48812 Encounter for surgical aftercare following surgery on the circulatory system: Secondary | ICD-10-CM | POA: Diagnosis not present

## 2016-07-07 DIAGNOSIS — H409 Unspecified glaucoma: Secondary | ICD-10-CM | POA: Diagnosis not present

## 2016-07-07 DIAGNOSIS — I2510 Atherosclerotic heart disease of native coronary artery without angina pectoris: Secondary | ICD-10-CM | POA: Diagnosis not present

## 2016-07-07 DIAGNOSIS — Z8673 Personal history of transient ischemic attack (TIA), and cerebral infarction without residual deficits: Secondary | ICD-10-CM | POA: Diagnosis not present

## 2016-07-07 DIAGNOSIS — R7303 Prediabetes: Secondary | ICD-10-CM | POA: Diagnosis not present

## 2016-07-11 DIAGNOSIS — I2510 Atherosclerotic heart disease of native coronary artery without angina pectoris: Secondary | ICD-10-CM | POA: Diagnosis not present

## 2016-07-11 DIAGNOSIS — H547 Unspecified visual loss: Secondary | ICD-10-CM | POA: Diagnosis not present

## 2016-07-11 DIAGNOSIS — H409 Unspecified glaucoma: Secondary | ICD-10-CM | POA: Diagnosis not present

## 2016-07-11 DIAGNOSIS — Z8673 Personal history of transient ischemic attack (TIA), and cerebral infarction without residual deficits: Secondary | ICD-10-CM | POA: Diagnosis not present

## 2016-07-11 DIAGNOSIS — F419 Anxiety disorder, unspecified: Secondary | ICD-10-CM | POA: Diagnosis not present

## 2016-07-11 DIAGNOSIS — I10 Essential (primary) hypertension: Secondary | ICD-10-CM | POA: Diagnosis not present

## 2016-07-11 DIAGNOSIS — R7303 Prediabetes: Secondary | ICD-10-CM | POA: Diagnosis not present

## 2016-07-11 DIAGNOSIS — Z48812 Encounter for surgical aftercare following surgery on the circulatory system: Secondary | ICD-10-CM | POA: Diagnosis not present

## 2016-07-11 DIAGNOSIS — I214 Non-ST elevation (NSTEMI) myocardial infarction: Secondary | ICD-10-CM | POA: Diagnosis not present

## 2016-07-12 DIAGNOSIS — Z48812 Encounter for surgical aftercare following surgery on the circulatory system: Secondary | ICD-10-CM | POA: Diagnosis not present

## 2016-07-12 DIAGNOSIS — Z955 Presence of coronary angioplasty implant and graft: Secondary | ICD-10-CM | POA: Diagnosis not present

## 2016-07-12 DIAGNOSIS — R278 Other lack of coordination: Secondary | ICD-10-CM | POA: Diagnosis not present

## 2016-07-12 DIAGNOSIS — M6281 Muscle weakness (generalized): Secondary | ICD-10-CM | POA: Diagnosis not present

## 2016-07-13 DIAGNOSIS — I2510 Atherosclerotic heart disease of native coronary artery without angina pectoris: Secondary | ICD-10-CM | POA: Diagnosis not present

## 2016-07-13 DIAGNOSIS — I10 Essential (primary) hypertension: Secondary | ICD-10-CM | POA: Diagnosis not present

## 2016-07-13 DIAGNOSIS — F419 Anxiety disorder, unspecified: Secondary | ICD-10-CM | POA: Diagnosis not present

## 2016-07-13 DIAGNOSIS — H409 Unspecified glaucoma: Secondary | ICD-10-CM | POA: Diagnosis not present

## 2016-07-13 DIAGNOSIS — H547 Unspecified visual loss: Secondary | ICD-10-CM | POA: Diagnosis not present

## 2016-07-13 DIAGNOSIS — Z8673 Personal history of transient ischemic attack (TIA), and cerebral infarction without residual deficits: Secondary | ICD-10-CM | POA: Diagnosis not present

## 2016-07-13 DIAGNOSIS — R7303 Prediabetes: Secondary | ICD-10-CM | POA: Diagnosis not present

## 2016-07-13 DIAGNOSIS — Z48812 Encounter for surgical aftercare following surgery on the circulatory system: Secondary | ICD-10-CM | POA: Diagnosis not present

## 2016-07-13 DIAGNOSIS — I214 Non-ST elevation (NSTEMI) myocardial infarction: Secondary | ICD-10-CM | POA: Diagnosis not present

## 2016-07-14 ENCOUNTER — Other Ambulatory Visit: Payer: Self-pay | Admitting: Cardiothoracic Surgery

## 2016-07-14 DIAGNOSIS — H547 Unspecified visual loss: Secondary | ICD-10-CM | POA: Diagnosis not present

## 2016-07-14 DIAGNOSIS — H409 Unspecified glaucoma: Secondary | ICD-10-CM | POA: Diagnosis not present

## 2016-07-14 DIAGNOSIS — Z48812 Encounter for surgical aftercare following surgery on the circulatory system: Secondary | ICD-10-CM | POA: Diagnosis not present

## 2016-07-14 DIAGNOSIS — R7303 Prediabetes: Secondary | ICD-10-CM | POA: Diagnosis not present

## 2016-07-14 DIAGNOSIS — I2510 Atherosclerotic heart disease of native coronary artery without angina pectoris: Secondary | ICD-10-CM | POA: Diagnosis not present

## 2016-07-14 DIAGNOSIS — Z951 Presence of aortocoronary bypass graft: Secondary | ICD-10-CM

## 2016-07-14 DIAGNOSIS — Z8673 Personal history of transient ischemic attack (TIA), and cerebral infarction without residual deficits: Secondary | ICD-10-CM | POA: Diagnosis not present

## 2016-07-14 DIAGNOSIS — I214 Non-ST elevation (NSTEMI) myocardial infarction: Secondary | ICD-10-CM | POA: Diagnosis not present

## 2016-07-14 DIAGNOSIS — I10 Essential (primary) hypertension: Secondary | ICD-10-CM | POA: Diagnosis not present

## 2016-07-14 DIAGNOSIS — F419 Anxiety disorder, unspecified: Secondary | ICD-10-CM | POA: Diagnosis not present

## 2016-07-17 ENCOUNTER — Ambulatory Visit (INDEPENDENT_AMBULATORY_CARE_PROVIDER_SITE_OTHER): Payer: Self-pay | Admitting: Surgical

## 2016-07-17 ENCOUNTER — Ambulatory Visit
Admission: RE | Admit: 2016-07-17 | Discharge: 2016-07-17 | Disposition: A | Discharge status: Home / Self Care | Payer: Medicare HMO | Source: Ambulatory Visit | Attending: Cardiothoracic Surgery | Admitting: Cardiothoracic Surgery

## 2016-07-17 VITALS — BP 106/67 | HR 58 | Resp 16 | Ht <= 58 in | Wt 167.0 lb

## 2016-07-17 DIAGNOSIS — Z951 Presence of aortocoronary bypass graft: Secondary | ICD-10-CM

## 2016-07-17 DIAGNOSIS — I2510 Atherosclerotic heart disease of native coronary artery without angina pectoris: Secondary | ICD-10-CM

## 2016-07-17 DIAGNOSIS — Z452 Encounter for adjustment and management of vascular access device: Secondary | ICD-10-CM | POA: Diagnosis not present

## 2016-07-17 NOTE — Progress Notes (Signed)
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  SURGICAL PROCEDURE:  Coronary artery bypass grafting x4 with the left internal mammary to the left anterior descending coronary artery, reverse saphenous vein graft to the second diagonal coronary artery, reverse saphenous vein graft to the first obtuse marginal, reverse saphenous vein graft to the distal right coronary artery with bilateral greater saphenous endoscopic vein harvesting from the thighs.  SURGEON:  Sheliah Plane, MD  FIRST ASSISTANT:  Doree Fudge, PA.    History of Present Illness:    The patient is a 76 year old female status post the above procedure. She is in the office on today's date in routine follow-up. At time of discharge she was sent to the skilled nursing facility for further rehabilitation. She has been discharged from there and feels as though she is doing quite well. She does have some minor discomfort but does not even use any medications including Tylenol. She denies shortness of breath. She is not having any chest pain. She states that she feels cold all the time but denies fevers, chills or other constitutional symptoms. She has had no difficulties with her wounds. She does continue to have some lower extremity edema.      Past Medical History:  Diagnosis Date  . Anxiety   . Blindness of left eye   . Complication of anesthesia    has gas  trapped in her body after  . DVT (deep venous thrombosis) (HCC)   . Ear problems   . Family history of adverse reaction to anesthesia    daughter has gas trapped in her body postop as well  . Glaucoma   . Hypertension   . Pre-diabetes    pt. told by [facility] staff that she is prediabetic, HgbA1c was 5.4  . Stroke [facility])    having TIA's     History  Smoking Status  . Never Smoker  Smokeless Tobacco  . Never Used    History  Alcohol Use No     Allergies  Allergen Reactions  . Other Hives    Mycins-   Pt has Glaucoma in Right eye, Blind in Left eye .... PLEASE DO NOT GIVE ANYTHING TO PATIENT THAT MIGHT DESTROY VISION   . Prednisone Other (See Comments)    Altered mental status  . Statins Other (See Comments)    MYALGIAS Weakness, muscle aches, and pain  . Erythromycin     UNSPECIFIED REACTION     Current Outpatient Prescriptions  Medication Sig Dispense Refill  . ALPRAZolam (XANAX) 0.25 MG tablet Take 0.5 tablets (0.125 mg total) by mouth 3 (three) times daily as needed for anxiety. 30 tablet 0  . aspirin EC 325 MG EC tablet Take 1 tablet (325 mg total) by mouth daily. 30 tablet 0  .  esomeprazole (NEXIUM 24HR) 20 MG capsule Take 1 capsule (20 mg total) by mouth at bedtime. 30 capsule 3  . fexofenadine (ALLEGRA) 180 MG tablet Take 180 mg by mouth daily.    . furosemide (LASIX) 40 MG tablet Take 1 tablet (40 mg total) by mouth daily. 30 tablet 3  . hydrocortisone (ANUSOL-HC) 25 MG suppository Place 25 mg rectally 2 (two) times daily.    Marland Kitchen lisinopril (PRINIVIL,ZESTRIL) 2.5 MG tablet Take 1 tablet (2.5 mg total) by mouth daily. 30 tablet 3  . metoprolol tartrate (LOPRESSOR) 25 MG tablet Take 1 tablet (25 mg total) by mouth 2 (two) times daily. 60 tablet 3  . polyethylene glycol (MIRALAX) packet Take 17 g by mouth daily. 14 each 0  . potassium chloride SA (K-DUR,KLOR-CON) 20 MEQ tablet Take 1 tablet (20 mEq total) by mouth daily. 30 tablet 3  . timolol (BETIMOL) 0.5 % ophthalmic  solution Place 1 drop into the right eye 2 (two) times daily.     . vitamin B-12 (CYANOCOBALAMIN) 1000 MCG tablet Take 1,000 mcg by mouth at bedtime.      No current facility-administered medications for this visit.        Physical Exam: Resp 16   Ht 4' 7.51" (1.41 m)   Wt 167 lb (75.8 kg)   BMI 38.10 kg/m   General appearance: alert, cooperative and no distress Heart: regular rate and rhythm Lungs: clear to auscultation bilaterally Abdomen: soft, non-tender; bowel sounds normal; no masses,  no organomegaly Extremities: + LE edema Incis healing well without signs of infection   Diagnostic Studies & Laboratory data:     Recent Radiology Findings:   Dg Chest 2 View  Result Date: 07/17/2016 CLINICAL DATA:  Status post CABG EXAM: CHEST  2 VIEW COMPARISON:  06/13/2016 chest radiograph. FINDINGS: Sternotomy wires appear aligned and intact. Cholecystectomy clips are seen in the right upper quadrant of the abdomen. Stable cardiomediastinal silhouette with normal heart size. No pneumothorax. No pleural effusion. Lungs appear clear, with no acute consolidative airspace disease and no pulmonary edema. IMPRESSION: No active cardiopulmonary disease. Electronically Signed   By: Delbert Phenix M.D.   On: 07/17/2016 12:20      Recent Lab Findings: Lab Results  Component Value Date   WBC 6.0 06/30/2016   HGB 12.4 06/30/2016   HCT 38.9 06/30/2016   PLT 456 (H) 06/30/2016   GLUCOSE 117 (H) 06/30/2016   CHOL 216 (H) 06/05/2016   TRIG 128 06/05/2016   HDL 36 (L) 06/05/2016   LDLCALC 154 (H) 06/05/2016   ALT 13 (L) 06/08/2016   AST 20 06/08/2016   NA 138 06/30/2016   K 4.0 06/30/2016   CL 101 06/30/2016   CREATININE 0.86 06/30/2016   BUN 12 06/30/2016   CO2 29 06/30/2016   INR 1.51 06/09/2016   HGBA1C 5.4 06/08/2016      Assessment / Plan:  Overall she is doing quite well. We discussed further physical activity progression. She has been seen by cardiology since discharge. She has  no current surgically related issues except some minor sternal incision except for an mild numbness in the left lower extremity associated with endoscopic vein harvest. We will see her again on a when necessary basis for any surgically related issues or at request.         Kynlie Jane E, PA-C 07/17/2016 1:00 PM

## 2016-07-18 DIAGNOSIS — I214 Non-ST elevation (NSTEMI) myocardial infarction: Secondary | ICD-10-CM | POA: Diagnosis not present

## 2016-07-18 DIAGNOSIS — Z8673 Personal history of transient ischemic attack (TIA), and cerebral infarction without residual deficits: Secondary | ICD-10-CM | POA: Diagnosis not present

## 2016-07-18 DIAGNOSIS — H409 Unspecified glaucoma: Secondary | ICD-10-CM | POA: Diagnosis not present

## 2016-07-18 DIAGNOSIS — I2510 Atherosclerotic heart disease of native coronary artery without angina pectoris: Secondary | ICD-10-CM | POA: Diagnosis not present

## 2016-07-18 DIAGNOSIS — Z48812 Encounter for surgical aftercare following surgery on the circulatory system: Secondary | ICD-10-CM | POA: Diagnosis not present

## 2016-07-18 DIAGNOSIS — I10 Essential (primary) hypertension: Secondary | ICD-10-CM | POA: Diagnosis not present

## 2016-07-18 DIAGNOSIS — H547 Unspecified visual loss: Secondary | ICD-10-CM | POA: Diagnosis not present

## 2016-07-18 DIAGNOSIS — F419 Anxiety disorder, unspecified: Secondary | ICD-10-CM | POA: Diagnosis not present

## 2016-07-18 DIAGNOSIS — R7303 Prediabetes: Secondary | ICD-10-CM | POA: Diagnosis not present

## 2016-07-19 DIAGNOSIS — I214 Non-ST elevation (NSTEMI) myocardial infarction: Secondary | ICD-10-CM | POA: Diagnosis not present

## 2016-07-19 DIAGNOSIS — F419 Anxiety disorder, unspecified: Secondary | ICD-10-CM | POA: Diagnosis not present

## 2016-07-19 DIAGNOSIS — R7303 Prediabetes: Secondary | ICD-10-CM | POA: Diagnosis not present

## 2016-07-19 DIAGNOSIS — Z48812 Encounter for surgical aftercare following surgery on the circulatory system: Secondary | ICD-10-CM | POA: Diagnosis not present

## 2016-07-19 DIAGNOSIS — I2510 Atherosclerotic heart disease of native coronary artery without angina pectoris: Secondary | ICD-10-CM | POA: Diagnosis not present

## 2016-07-19 DIAGNOSIS — H547 Unspecified visual loss: Secondary | ICD-10-CM | POA: Diagnosis not present

## 2016-07-19 DIAGNOSIS — Z8673 Personal history of transient ischemic attack (TIA), and cerebral infarction without residual deficits: Secondary | ICD-10-CM | POA: Diagnosis not present

## 2016-07-19 DIAGNOSIS — H409 Unspecified glaucoma: Secondary | ICD-10-CM | POA: Diagnosis not present

## 2016-07-19 DIAGNOSIS — I10 Essential (primary) hypertension: Secondary | ICD-10-CM | POA: Diagnosis not present

## 2016-07-20 DIAGNOSIS — I10 Essential (primary) hypertension: Secondary | ICD-10-CM | POA: Diagnosis not present

## 2016-07-20 DIAGNOSIS — R7303 Prediabetes: Secondary | ICD-10-CM | POA: Diagnosis not present

## 2016-07-20 DIAGNOSIS — Z48812 Encounter for surgical aftercare following surgery on the circulatory system: Secondary | ICD-10-CM | POA: Diagnosis not present

## 2016-07-20 DIAGNOSIS — H547 Unspecified visual loss: Secondary | ICD-10-CM | POA: Diagnosis not present

## 2016-07-20 DIAGNOSIS — I214 Non-ST elevation (NSTEMI) myocardial infarction: Secondary | ICD-10-CM | POA: Diagnosis not present

## 2016-07-20 DIAGNOSIS — I2510 Atherosclerotic heart disease of native coronary artery without angina pectoris: Secondary | ICD-10-CM | POA: Diagnosis not present

## 2016-07-20 DIAGNOSIS — H409 Unspecified glaucoma: Secondary | ICD-10-CM | POA: Diagnosis not present

## 2016-07-20 DIAGNOSIS — Z8673 Personal history of transient ischemic attack (TIA), and cerebral infarction without residual deficits: Secondary | ICD-10-CM | POA: Diagnosis not present

## 2016-07-20 DIAGNOSIS — F419 Anxiety disorder, unspecified: Secondary | ICD-10-CM | POA: Diagnosis not present

## 2016-07-25 DIAGNOSIS — Z48812 Encounter for surgical aftercare following surgery on the circulatory system: Secondary | ICD-10-CM | POA: Diagnosis not present

## 2016-07-25 DIAGNOSIS — F419 Anxiety disorder, unspecified: Secondary | ICD-10-CM | POA: Diagnosis not present

## 2016-07-25 DIAGNOSIS — R7303 Prediabetes: Secondary | ICD-10-CM | POA: Diagnosis not present

## 2016-07-25 DIAGNOSIS — H409 Unspecified glaucoma: Secondary | ICD-10-CM | POA: Diagnosis not present

## 2016-07-25 DIAGNOSIS — H547 Unspecified visual loss: Secondary | ICD-10-CM | POA: Diagnosis not present

## 2016-07-25 DIAGNOSIS — I214 Non-ST elevation (NSTEMI) myocardial infarction: Secondary | ICD-10-CM | POA: Diagnosis not present

## 2016-07-25 DIAGNOSIS — I10 Essential (primary) hypertension: Secondary | ICD-10-CM | POA: Diagnosis not present

## 2016-07-25 DIAGNOSIS — I2510 Atherosclerotic heart disease of native coronary artery without angina pectoris: Secondary | ICD-10-CM | POA: Diagnosis not present

## 2016-07-25 DIAGNOSIS — Z8673 Personal history of transient ischemic attack (TIA), and cerebral infarction without residual deficits: Secondary | ICD-10-CM | POA: Diagnosis not present

## 2016-07-26 DIAGNOSIS — F419 Anxiety disorder, unspecified: Secondary | ICD-10-CM | POA: Diagnosis not present

## 2016-07-26 DIAGNOSIS — H547 Unspecified visual loss: Secondary | ICD-10-CM | POA: Diagnosis not present

## 2016-07-26 DIAGNOSIS — H409 Unspecified glaucoma: Secondary | ICD-10-CM | POA: Diagnosis not present

## 2016-07-26 DIAGNOSIS — Z8673 Personal history of transient ischemic attack (TIA), and cerebral infarction without residual deficits: Secondary | ICD-10-CM | POA: Diagnosis not present

## 2016-07-26 DIAGNOSIS — I214 Non-ST elevation (NSTEMI) myocardial infarction: Secondary | ICD-10-CM | POA: Diagnosis not present

## 2016-07-26 DIAGNOSIS — R7303 Prediabetes: Secondary | ICD-10-CM | POA: Diagnosis not present

## 2016-07-26 DIAGNOSIS — Z48812 Encounter for surgical aftercare following surgery on the circulatory system: Secondary | ICD-10-CM | POA: Diagnosis not present

## 2016-07-26 DIAGNOSIS — I2510 Atherosclerotic heart disease of native coronary artery without angina pectoris: Secondary | ICD-10-CM | POA: Diagnosis not present

## 2016-07-26 DIAGNOSIS — I10 Essential (primary) hypertension: Secondary | ICD-10-CM | POA: Diagnosis not present

## 2016-07-28 ENCOUNTER — Emergency Department (HOSPITAL_COMMUNITY): Payer: Medicare HMO

## 2016-07-28 ENCOUNTER — Emergency Department (HOSPITAL_COMMUNITY)
Admission: EM | Admit: 2016-07-28 | Discharge: 2016-07-28 | Disposition: A | Discharge status: Home / Self Care | Payer: Medicare HMO | Attending: Emergency Medicine | Admitting: Emergency Medicine

## 2016-07-28 ENCOUNTER — Encounter (HOSPITAL_COMMUNITY): Payer: Self-pay | Admitting: *Deleted

## 2016-07-28 DIAGNOSIS — R791 Abnormal coagulation profile: Secondary | ICD-10-CM | POA: Diagnosis not present

## 2016-07-28 DIAGNOSIS — Z7982 Long term (current) use of aspirin: Secondary | ICD-10-CM | POA: Diagnosis not present

## 2016-07-28 DIAGNOSIS — R200 Anesthesia of skin: Secondary | ICD-10-CM | POA: Insufficient documentation

## 2016-07-28 DIAGNOSIS — Z8673 Personal history of transient ischemic attack (TIA), and cerebral infarction without residual deficits: Secondary | ICD-10-CM | POA: Diagnosis not present

## 2016-07-28 DIAGNOSIS — Z48812 Encounter for surgical aftercare following surgery on the circulatory system: Secondary | ICD-10-CM | POA: Diagnosis not present

## 2016-07-28 DIAGNOSIS — F419 Anxiety disorder, unspecified: Secondary | ICD-10-CM | POA: Diagnosis not present

## 2016-07-28 DIAGNOSIS — I214 Non-ST elevation (NSTEMI) myocardial infarction: Secondary | ICD-10-CM | POA: Diagnosis not present

## 2016-07-28 DIAGNOSIS — Z79899 Other long term (current) drug therapy: Secondary | ICD-10-CM | POA: Diagnosis not present

## 2016-07-28 DIAGNOSIS — I10 Essential (primary) hypertension: Secondary | ICD-10-CM | POA: Insufficient documentation

## 2016-07-28 DIAGNOSIS — R7303 Prediabetes: Secondary | ICD-10-CM | POA: Diagnosis not present

## 2016-07-28 DIAGNOSIS — H409 Unspecified glaucoma: Secondary | ICD-10-CM | POA: Diagnosis not present

## 2016-07-28 DIAGNOSIS — H547 Unspecified visual loss: Secondary | ICD-10-CM | POA: Diagnosis not present

## 2016-07-28 DIAGNOSIS — I2510 Atherosclerotic heart disease of native coronary artery without angina pectoris: Secondary | ICD-10-CM | POA: Insufficient documentation

## 2016-07-28 LAB — DIFFERENTIAL
BASOS PCT: 0 %
Basophils Absolute: 0 10*3/uL (ref 0.0–0.1)
EOS ABS: 0.1 10*3/uL (ref 0.0–0.7)
EOS PCT: 2 %
LYMPHS ABS: 2.6 10*3/uL (ref 0.7–4.0)
Lymphocytes Relative: 35 %
MONOS PCT: 5 %
Monocytes Absolute: 0.4 10*3/uL (ref 0.1–1.0)
NEUTROS PCT: 58 %
Neutro Abs: 4.2 10*3/uL (ref 1.7–7.7)

## 2016-07-28 LAB — I-STAT CHEM 8, ED
BUN: 14 mg/dL (ref 6–20)
CALCIUM ION: 1.13 mmol/L — AB (ref 1.15–1.40)
Chloride: 96 mmol/L — ABNORMAL LOW (ref 101–111)
Creatinine, Ser: 1.2 mg/dL — ABNORMAL HIGH (ref 0.44–1.00)
Glucose, Bld: 111 mg/dL — ABNORMAL HIGH (ref 65–99)
HCT: 40 % (ref 36.0–46.0)
HEMOGLOBIN: 13.6 g/dL (ref 12.0–15.0)
Potassium: 3.8 mmol/L (ref 3.5–5.1)
SODIUM: 132 mmol/L — AB (ref 135–145)
TCO2: 26 mmol/L (ref 0–100)

## 2016-07-28 LAB — COMPREHENSIVE METABOLIC PANEL
ALT: 14 U/L (ref 14–54)
ANION GAP: 9 (ref 5–15)
AST: 25 U/L (ref 15–41)
Albumin: 3.7 g/dL (ref 3.5–5.0)
Alkaline Phosphatase: 79 U/L (ref 38–126)
BILIRUBIN TOTAL: 0.4 mg/dL (ref 0.3–1.2)
BUN: 14 mg/dL (ref 6–20)
CO2: 26 mmol/L (ref 22–32)
Calcium: 9.7 mg/dL (ref 8.9–10.3)
Chloride: 96 mmol/L — ABNORMAL LOW (ref 101–111)
Creatinine, Ser: 1.05 mg/dL — ABNORMAL HIGH (ref 0.44–1.00)
GFR calc Af Amer: 58 mL/min — ABNORMAL LOW (ref >60)
GFR, EST NON AFRICAN AMERICAN: 50 mL/min — AB (ref >60)
Glucose, Bld: 114 mg/dL — ABNORMAL HIGH (ref 65–99)
POTASSIUM: 3.8 mmol/L (ref 3.5–5.1)
Sodium: 131 mmol/L — ABNORMAL LOW (ref 135–145)
Total Protein: 6.9 g/dL (ref 6.5–8.1)

## 2016-07-28 LAB — CBC
HEMATOCRIT: 39 % (ref 36.0–46.0)
HEMOGLOBIN: 13.3 g/dL (ref 12.0–15.0)
MCH: 30.7 pg (ref 26.0–34.0)
MCHC: 34.1 g/dL (ref 30.0–36.0)
MCV: 90.1 fL (ref 78.0–100.0)
Platelets: 286 10*3/uL (ref 150–400)
RBC: 4.33 MIL/uL (ref 3.87–5.11)
RDW: 12.7 % (ref 11.5–15.5)
WBC: 7.4 10*3/uL (ref 4.0–10.5)

## 2016-07-28 LAB — PROTIME-INR
INR: 0.99
Prothrombin Time: 13.1 seconds (ref 11.4–15.2)

## 2016-07-28 LAB — CBG MONITORING, ED: GLUCOSE-CAPILLARY: 104 mg/dL — AB (ref 65–99)

## 2016-07-28 LAB — I-STAT TROPONIN, ED: TROPONIN I, POC: 0.06 ng/mL (ref 0.00–0.08)

## 2016-07-28 LAB — APTT: aPTT: 33 seconds (ref 24–36)

## 2016-07-28 NOTE — ED Notes (Signed)
Patient complaining of numbness to upper and lower left side of lips, left side of neck, and left lower leg and left foot.

## 2016-07-28 NOTE — ED Triage Notes (Signed)
Pt started having left lower leg and left cheek numbness starting around 1220. Denies any weakness. Pt denies any weakness in extremities. Pt cleared for ct by Dr. Rhunette CroftNanavati at 1234, verbal orders to call code stroke.

## 2016-07-28 NOTE — ED Notes (Signed)
Dr. Rhunette CroftNanavati notified of troponin 0.06

## 2016-07-28 NOTE — ED Provider Notes (Signed)
[facility]-EMERGENCY DEPT Provider Note   CSN: [id number] Arrival date & time: 07/28/16  1232   An emergency department physician performed an initial assessment on this suspected stroke patient at 1234.  History   Chief Complaint Chief Complaint  Patient presents with  . Code Stroke    HPI Sheryl Carter is a 76 y.o. female.  HPI Pt comes in with cc of L sided lower facial and L sided distal leg numbness. Pt has CAD, s/p CABG, stroke hx. She had a CABG done on 3/16. She reports that her current symptoms started while she was reading her book, and her symptoms have spread a bit since the onset. No associated weakness, dizziness, vision complains.    Past Medical History:  Diagnosis Date  . Anxiety   . Blindness of left eye   . Complication of anesthesia    has gas trapped in her body after  . DVT (deep venous thrombosis) (HCC)   . Ear problems   . Family history of adverse reaction to anesthesia    daughter has gas trapped in her body postop as well  . Glaucoma   . Hypertension   . Pre-diabetes    [facility] prediabetic, HgbA1c was 5.4  . Stroke [facility]HCC)    having TIA's    Patient Active Problem List   Diagnosis Date Noted  . Constipation 06/26/2016  . 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 Coronary artery disease   . NSTEMI (non-ST elevated myocardial infarction) (HCC) 06/07/2016  . Paresthesia   . TIA (transient ischemic attack) 06/04/2016  . Symptomatic carotid artery stenosis, left 05/29/2016  . Slurred speech 04/18/2016  . Hypertension, essential 04/18/2016  . Hyperlipidemia 04/18/2016  . Chronic anxiety 04/18/2016    Past Surgical History:  Procedure Laterality Date  . BREAST SURGERY Left    cyst- L breast  . CHOLECYSTECTOMY    . CORONARY ARTERY BYPASS GRAFT N/A 06/09/2016   Procedure: CORONARY ARTERY BYPASS GRAFTING (CABG) times 4 using the left internal mammary artery and bilateral thigh greater saphenous veins harvested endoscopically. LIMA to  LAD, SVG to DIAGONAL 2, SVG to OM, SVG to RCA;  Surgeon: Delight OvensEdward B Gerhardt, MD;  [facility];  Service: Open Heart Surgery;  Laterality: N/A;  . ENDARTERECTOMY Left 05/29/2016   Procedure: LEFT CAROTID ENDARTERECTOMY WITH PATCH ANGIOPLASTY;  Surgeon: Larina Earthlyodd F Early, MD;  [facility];  Service: Vascular;  Laterality: Left;  . EYE SURGERY    . LEFT HEART CATH AND CORONARY ANGIOGRAPHY N/A 06/07/2016   Procedure: Left Heart Cath and Coronary Angiography;  Surgeon: Iran OuchMuhammad A Arida, MD;  Location: [facility] INVASIVE CV LAB;  Service: Cardiovascular;  Laterality: N/A;  . TEE WITHOUT CARDIOVERSION N/A 06/09/2016   Procedure: TRANSESOPHAGEAL ECHOCARDIOGRAM (TEE);  Surgeon: Delight OvensEdward B Gerhardt, MD;  [facility];  Service: Open Heart Surgery;  Laterality: N/A;    OB History    No data available       Home Medications    Prior to Admission medications   Medication Sig Start Date End Date Taking? Authorizing Provider  ALPRAZolam (XANAX) 0.25 MG tablet Take 0.5 tablets (0.125 mg total) by mouth 3 (three) times daily as needed for anxiety. Patient taking differently: Take 0.125 mg by mouth 2 (two) times daily. One half in the morning One half at night 06/15/16  Yes Erin R Barrett, PA-C  aspirin EC 325 MG EC tablet Take 1 tablet (325 mg total) by mouth daily. 06/15/16  Yes Erin R Barrett, PA-C  esomeprazole (NEXIUM 24HR) 20 MG capsule Take 1 capsule (20 mg total) by mouth at bedtime. 06/30/16  Yes Jodelle Gross, NP  furosemide (LASIX) 40 MG tablet Take 1 tablet (40 mg total) by mouth daily. 06/30/16  Yes Jodelle Gross, NP  lisinopril (PRINIVIL,ZESTRIL) 2.5 MG tablet Take 1 tablet (2.5 mg total) by mouth daily. 06/30/16  Yes Jodelle Gross, NP  metoprolol tartrate (LOPRESSOR) 25 MG tablet Take 1 tablet (25 mg total) by mouth 2 (two) times daily. 06/30/16  Yes Jodelle Gross, NP  Omega-3 Fatty Acids (FISH OIL) 1000 MG CAPS Take 1,000 mg by mouth daily.   Yes Historical Provider, MD  polyethylene  glycol (MIRALAX) packet Take 17 g by mouth daily. 06/15/16  Yes Erin R Barrett, PA-C  potassium chloride SA (K-DUR,KLOR-CON) 20 MEQ tablet Take 1 tablet (20 mEq total) by mouth daily. 06/30/16  Yes Jodelle Gross, NP  timolol (BETIMOL) 0.5 % ophthalmic solution Place 1 drop into the right eye 2 (two) times daily.    Yes Historical Provider, MD  vitamin B-12 (CYANOCOBALAMIN) 1000 MCG tablet Take 1,000 mcg by mouth at bedtime.    Yes Historical Provider, MD  fexofenadine (ALLEGRA) 180 MG tablet Take 180 mg by mouth daily.    Historical Provider, MD  hydrocortisone (ANUSOL-HC) 25 MG suppository Place 25 mg rectally 2 (two) times daily.    Historical Provider, MD    Family History Family History  Problem Relation Age of Onset  . Heart disease Sister   . Heart disease Son     Social History Social History  Substance Use Topics  . Smoking status: Never Smoker  . Smokeless tobacco: Never Used  . Alcohol use No     Allergies   Other; Prednisone; Statins; Erythromycin; and Tylenol [acetaminophen]   Review of Systems Review of Systems  Constitutional: Positive for activity change.  Respiratory: Negative for shortness of breath.   Cardiovascular: Negative for chest pain.  Neurological: Positive for numbness. Negative for dizziness, speech difficulty, light-headedness and headaches.  All other systems reviewed and are negative.    Physical Exam Updated Vital Signs BP (!) 99/56   Pulse (!) 51   Temp 98.3 F (36.8 C)   Resp 15   Ht 4\' 7"  (1.397 m)   Wt 164 lb (74.4 kg)   SpO2 99%   BMI 38.12 kg/m   Physical Exam  Constitutional: She is oriented to person, place, and time. She appears well-developed.  HENT:  Head: Normocephalic and atraumatic.  Eyes: Conjunctivae and EOM are normal. Pupils are equal, round, and reactive to light.  Neck: Normal range of motion. Neck supple.  Cardiovascular: Normal rate, regular rhythm and intact distal pulses.   Murmur  heard. Pulmonary/Chest: Effort normal and breath sounds normal. No respiratory distress.  Abdominal: Soft. Bowel sounds are normal. She exhibits no distension. There is no tenderness. There is no rebound and no guarding.  Musculoskeletal: She exhibits no edema or tenderness.  Neurological: She is alert and oriented to person, place, and time. No cranial nerve deficit. Coordination normal.  Patient has subjective numbness to the face and distal L and foot L sided. OTHERWISE:  Cerebellar exam is normal (finger to nose) Sensory exam normal for bilateral upper and lower extremities - and patient is able to discriminate between sharp and dull. Motor exam is 4+/5   Skin: Skin is warm and dry.  Nursing note and vitals reviewed.    ED Treatments /  Results  Labs (all labs ordered are listed, but only abnormal results are displayed) Labs Reviewed  COMPREHENSIVE METABOLIC PANEL - Abnormal; Notable for the following:       Result Value   Sodium 131 (*)    Chloride 96 (*)    Glucose, Bld 114 (*)    Creatinine, Ser 1.05 (*)    GFR calc non Af Amer 50 (*)    GFR calc Af Amer 58 (*)    All other components within normal limits  CBG MONITORING, ED - Abnormal; Notable for the following:    Glucose-Capillary 104 (*)    All other components within normal limits  I-STAT CHEM 8, ED - Abnormal; Notable for the following:    Sodium 132 (*)    Chloride 96 (*)    Creatinine, Ser 1.20 (*)    Glucose, Bld 111 (*)    Calcium, Ion 1.13 (*)    All other components within normal limits  PROTIME-INR  APTT  CBC  DIFFERENTIAL  I-STAT TROPOININ, ED    EKG  EKG Interpretation None       Radiology [name]  Result Date: 07/28/2016 CLINICAL DATA:  Left facial and leg numbness EXAM: MRI HEAD WITHOUT CONTRAST TECHNIQUE: Multiplanar, multiecho pulse sequences of the brain and surrounding structures were obtained without intravenous contrast. COMPARISON:  CT head 07/28/2016, MRI 06/05/2016  FINDINGS: Brain: Negative for acute infarct. Generalized atrophy with mild chronic microvascular ischemia in the white matter. Negative for mass or edema. Chronic microhemorrhage left external capsule. Vascular: Normal arterial flow void Skull and upper cervical spine: Negative Sinuses/Orbits: Mild mucosal edema in the mastoid tip bilaterally. Paranasal sinuses clear. Cataract removal on the left. Other: None IMPRESSION: Negative for acute infarct. Atrophy and mild chronic microvascular ischemia in the white matter. Electronically Signed   By: Marlan Palau M.D.   On: 07/28/2016 15:36   Ct Head Code Stroke W/o Cm  Result Date: 07/28/2016 CLINICAL DATA:  Code stroke. 76 year old female with acute onset lip and neck numbness less than an hour ago. EXAM: CT HEAD WITHOUT CONTRAST TECHNIQUE: Contiguous axial images were obtained from the base of the skull through the vertex without intravenous contrast. COMPARISON:  Brain MRI 06/05/2016 and earlier. FINDINGS: Brain: Stable cerebral volume. Small chronic posterior right cerebellar infarct again noted. Patchy bilateral mostly periventricular white matter hypodensity appears stable. Stable gray-white matter differentiation throughout the brain. No acute intracranial hemorrhage identified. No midline shift, mass effect, or evidence of intracranial mass lesion. CED No cortically based acute infarct identified. Vascular: Calcified atherosclerosis at the skull base. No suspicious intracranial vascular hyperdensity. Skull: No acute osseous abnormality identified. Sinuses/Orbits: Visualized paranasal sinuses and mastoids are stable and well pneumatized. Other: No acute orbit or scalp soft tissue findings. ASPECTS (Alberta Stroke Program Early CT Score) - Ganglionic level infarction (caudate, lentiform nuclei, internal capsule, insula, M1-M3 cortex): 7 - Supraganglionic infarction (M4-M6 cortex): 3 Total score (0-10 with 10 being normal): 10 IMPRESSION: 1. Stable non contrast  CT appearance of the brain since March. 2. ASPECTS is 10. 3. Study discussed by telephone with Dr. Derwood Kaplan on 07/28/2016 at 13:04 . Electronically Signed   By: Odessa Fleming M.D.   On: 07/28/2016 13:05    Procedures Procedures (including critical care time)  Medications Ordered in ED Medications - No data to display   Initial Impression / Assessment and Plan / ED Course  I have reviewed the triage vital signs and the nursing notes.  Pertinent labs &  imaging results that were available during my care of the patient were reviewed by me and considered in my medical decision making (see chart for details).  Clinical Course as of Jul 29 1623  Fri Jul 28, 2016  1454 3/16 TEE Study Conclusions  - Left ventricle: Systolic function was normal. The estimated   ejection fraction was in the range of 50% to 55%. Hypokinesis of   the inferior myocardium, more pronounced at the base. - Staged echo: Limited post-CPB exam: Improved LV function. The   inferior hypokinesis has recovered function. Overall EF 50-55%.  Impressions:  - LV function improvement from pre-bypass images. No other change   from pre-bypass images.   06/06/2015:  IMPRESSION: MRI head: No acute finding. 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 Moderate chronic small-vessel ischemic changes affecting the cerebral hemispheres as seen previously.  MRA head: No change. Atherosclerotic change in both carotid siphon regions without flow limiting stenosis. No intracranial large or medium vessel anterior circulation abnormal finding. Small right vertebral artery which gives flow to right PICA but does not contribute to the basilar. Dominant left vertebral artery widely patent to the basilar. Atherosclerotic irregularity of the basilar artery without flow limiting stenosis.  [AN]  1458 Coronary Cath: 3/14 Coronary artery bypass grafting x4 : 3/16  [AN]  1623 Case discussed with the tele-neuro and then with Dr.  Roxy Manns. Pt had a neg MRA on 3/12 - 4 days before the CABG. If the MRI is neg, there is no need for MRA neck and head.  Pt informed that it will be important for her to see a Neurologist and multiple resources provided. Daughter very involved in care, which is excellent.  Strict ER return precautions have been discussed, and patient is agreeing with the plan and is comfortable with the workup done and the recommendations from the ER.   Results from the ER workup discussed with the patient face to face and all questions answered to the best of my ability.   [AN]    Clinical Course User Index [AN] Derwood Kaplan, MD   Pt comes in with L sided symptoms of numbness. Her numbness is specific to only small part of her face and leg on the L side. MRI ordered to r/o stroke. She had a CABG recently, which has a small risk for strokes - but usually they are more acute then pt's presentation. No rash. No tick bites.  Final Clinical Impressions(s) / ED Diagnoses   Final diagnoses:  Left sided numbness  Left facial numbness    New Prescriptions New Prescriptions   No medications on file     Derwood Kaplan, MD 07/28/16 1625

## 2016-07-28 NOTE — Discharge Instructions (Signed)
We saw you in the ER for All the results in the ER are normal, labs and imaging. We are not sure what is causing your symptoms. The workup in the ER is not complete, and is limited to screening for life threatening and emergent conditions only, so please see a primary care doctor for further evaluation.  Please return to the ER if you have new one sided numbness, tingling, weakness or confusion, seizures, poor balance or poor vision.

## 2016-07-28 NOTE — ED Notes (Signed)
Patient taken to MRI at this time 

## 2016-07-28 NOTE — ED Notes (Signed)
Patient states numbness is same, no better, no worse. Still complains of numbness to lips, left side of neck, and left lower leg and left foot.

## 2016-07-28 NOTE — Progress Notes (Signed)
CODE STROKE 1241 call time 1247 exam started 1251 exam finished, [facility] [facility] spoke with [place].  Note: no call,.  We noticed the order @ 1240. We had an outpatient on the table prior to the order

## 2016-07-30 ENCOUNTER — Encounter (HOSPITAL_COMMUNITY): Payer: Self-pay | Admitting: Emergency Medicine

## 2016-07-30 ENCOUNTER — Emergency Department (HOSPITAL_COMMUNITY)
Admission: EM | Admit: 2016-07-30 | Discharge: 2016-07-30 | Disposition: A | Discharge status: Home / Self Care | Payer: Medicare HMO | Attending: Emergency Medicine | Admitting: Emergency Medicine

## 2016-07-30 DIAGNOSIS — I679 Cerebrovascular disease, unspecified: Secondary | ICD-10-CM | POA: Diagnosis not present

## 2016-07-30 DIAGNOSIS — Z79899 Other long term (current) drug therapy: Secondary | ICD-10-CM | POA: Insufficient documentation

## 2016-07-30 DIAGNOSIS — Z951 Presence of aortocoronary bypass graft: Secondary | ICD-10-CM | POA: Diagnosis not present

## 2016-07-30 DIAGNOSIS — I2510 Atherosclerotic heart disease of native coronary artery without angina pectoris: Secondary | ICD-10-CM | POA: Insufficient documentation

## 2016-07-30 DIAGNOSIS — R202 Paresthesia of skin: Secondary | ICD-10-CM | POA: Insufficient documentation

## 2016-07-30 DIAGNOSIS — I252 Old myocardial infarction: Secondary | ICD-10-CM | POA: Insufficient documentation

## 2016-07-30 DIAGNOSIS — G5621 Lesion of ulnar nerve, right upper limb: Secondary | ICD-10-CM

## 2016-07-30 DIAGNOSIS — Z8673 Personal history of transient ischemic attack (TIA), and cerebral infarction without residual deficits: Secondary | ICD-10-CM | POA: Diagnosis not present

## 2016-07-30 DIAGNOSIS — R200 Anesthesia of skin: Secondary | ICD-10-CM | POA: Diagnosis not present

## 2016-07-30 DIAGNOSIS — Z7982 Long term (current) use of aspirin: Secondary | ICD-10-CM | POA: Diagnosis not present

## 2016-07-30 DIAGNOSIS — I10 Essential (primary) hypertension: Secondary | ICD-10-CM | POA: Insufficient documentation

## 2016-07-30 LAB — CBC WITH DIFFERENTIAL/PLATELET
Basophils Absolute: 0 10*3/uL (ref 0.0–0.1)
Basophils Relative: 1 %
EOS ABS: 0.2 10*3/uL (ref 0.0–0.7)
Eosinophils Relative: 4 %
HCT: 36.5 % (ref 36.0–46.0)
HEMOGLOBIN: 12 g/dL (ref 12.0–15.0)
LYMPHS ABS: 1.9 10*3/uL (ref 0.7–4.0)
LYMPHS PCT: 45 %
MCH: 29.9 pg (ref 26.0–34.0)
MCHC: 32.9 g/dL (ref 30.0–36.0)
MCV: 90.8 fL (ref 78.0–100.0)
MONOS PCT: 5 %
Monocytes Absolute: 0.2 10*3/uL (ref 0.1–1.0)
NEUTROS PCT: 45 %
Neutro Abs: 1.9 10*3/uL (ref 1.7–7.7)
Platelets: 225 10*3/uL (ref 150–400)
RBC: 4.02 MIL/uL (ref 3.87–5.11)
RDW: 13.1 % (ref 11.5–15.5)
WBC: 4.3 10*3/uL (ref 4.0–10.5)

## 2016-07-30 LAB — BASIC METABOLIC PANEL
Anion gap: 9 (ref 5–15)
BUN: 10 mg/dL (ref 6–20)
CHLORIDE: 100 mmol/L — AB (ref 101–111)
CO2: 25 mmol/L (ref 22–32)
CREATININE: 1.04 mg/dL — AB (ref 0.44–1.00)
Calcium: 9.7 mg/dL (ref 8.9–10.3)
GFR calc Af Amer: 59 mL/min — ABNORMAL LOW (ref >60)
GFR calc non Af Amer: 51 mL/min — ABNORMAL LOW (ref >60)
GLUCOSE: 117 mg/dL — AB (ref 65–99)
Potassium: 3.8 mmol/L (ref 3.5–5.1)
SODIUM: 134 mmol/L — AB (ref 135–145)

## 2016-07-30 LAB — I-STAT TROPONIN, ED: Troponin i, poc: 0.03 ng/mL (ref 0.00–0.08)

## 2016-07-30 MED ORDER — SODIUM CHLORIDE 0.9 % IV SOLN: INTRAVENOUS | Status: DC

## 2016-07-30 NOTE — ED Triage Notes (Signed)
Pt from home with new numbness on her right lower lip and right lower leg and foot with right hand cramping starting today around 730 am this morning.  Pt reports she woke up and her head felt funny, ate breakfast and took an ativan.  Pt decided to come to the ED after the numbness did not resolve.  Pt reports left sided facial numbness, left arm numbness, and left lower leg and foot numbness since around 1220 Friday.  Pt was previously seen for the left numbness.  Pt additionally reports central sharp CP which has been intermittent since a recent surgery. NAD, A&O.

## 2016-07-30 NOTE — Consult Note (Signed)
Neurology Consult Note  Reason for Consultation: Diffuse numbness  Requesting provider: Lorre Nick, MD  CC: Numbness of left side x two months, new R hand and chin numbness  HPI: This is a 75-yo RH woman who presents to the ED for evaluation of numbness. History is obtained directly from the patient who is a good historian. Her daughter is present at the bedside and offers additional information as needed.   She reports that she has had numbness of her left foot, left face, and left breast since she underwent a L CEA in early March 2018. She noticed these symptoms immediately after waking from anesthesia and they have not improved. On 5/4 she was seen at [facility] for increased left facial numbness. On exam she was noted to have subjective numbness of the L face and foot with normal objective sensory exam. Teleneurology consultation was obtained and MRI brain was recommended. This showed no stroke or other pathology to explain her symptoms. She was discharged home with instructions to follow up with outpatient neurology.   Today she reports that she has noted some tingling of the right side of her chin. In addition, she had spasms of the ring finger of her right hand with possible R hand numbness. Her left-sided symptoms remain unchanged. She came back to the ED as she was told to get checked if she experienced any new symptoms. She admits to having increased stress and anxiety and says that she has been thinking about taking a third dose of her Xanax in the middle of the day as had been prescribed by her PCP.   PMH:  Past Medical History:  Diagnosis Date  . Anxiety   . Blindness of left eye   . Complication of anesthesia    has gas trapped in her body after  . DVT (deep venous thrombosis) (HCC)   . Ear problems   . Family history of adverse reaction to anesthesia    daughter has gas trapped in her body postop as well  . Glaucoma   . Hypertension   . Pre-diabetes    pt. told  by [facility] staff that she is prediabetic, HgbA1c was 5.4  . Stroke (HCC)    having TIA's    PSH:  Past Surgical History:  Procedure Laterality Date  . BREAST SURGERY Left    cyst- L breast  . CHOLECYSTECTOMY    . CORONARY ARTERY BYPASS GRAFT N/A 06/09/2016   Procedure: CORONARY ARTERY BYPASS GRAFTING (CABG) times 4 using the left internal mammary artery and bilateral thigh greater saphenous veins harvested endoscopically. LIMA to LAD, SVG to DIAGONAL 2, SVG to OM, SVG to RCA;  Surgeon: Delight Ovens, MD;  Location: [facility] OR;  Service: Open Heart Surgery;  Laterality: N/A;  . ENDARTERECTOMY Left 05/29/2016   Procedure: LEFT CAROTID ENDARTERECTOMY WITH PATCH ANGIOPLASTY;  Surgeon: Larina Earthly, MD;  Location: [facility] OR;  Service: Vascular;  Laterality: Left;  . EYE SURGERY    . LEFT HEART CATH AND CORONARY ANGIOGRAPHY N/A 06/07/2016   Procedure: Left Heart Cath and Coronary Angiography;  Surgeon: Iran Ouch, MD;  Location: [facility] INVASIVE CV LAB;  Service: Cardiovascular;  Laterality: N/A;  . TEE WITHOUT CARDIOVERSION N/A 06/09/2016   Procedure: TRANSESOPHAGEAL ECHOCARDIOGRAM (TEE);  Surgeon: Delight Ovens, MD;  Location: [facility] OR;  Service: Open Heart Surgery;  Laterality: N/A;    Family history: Family History  Problem Relation Age of Onset  . Heart disease Sister   . Heart  disease Son     Social history:  Social History   Social History  . Marital status: Divorced    Spouse name: N/A  . Number of children: N/A  . Years of education: N/A   Occupational History  . Not on file.   Social History Main Topics  . Smoking status: Never Smoker  . Smokeless tobacco: Never Used  . Alcohol use No  . Drug use: No  . Sexual activity: Not on file   Other Topics Concern  . Not on file   Social History Narrative  . No narrative on file    Current outpatient meds: Medications reviewed and reconciled.  Current Meds  Medication Sig  . ALPRAZolam (XANAX) 0.25 MG tablet Take 0.5 tablets  (0.125 mg total) by mouth 3 (three) times daily as needed for anxiety. (Patient taking differently: Take 0.125 mg by mouth 2 (two) times daily. One half in the morning One half at night)  . aspirin EC 325 MG EC tablet Take 1 tablet (325 mg total) by mouth daily.  Marland Kitchen. esomeprazole (NEXIUM 24HR) 20 MG capsule Take 1 capsule (20 mg total) by mouth at bedtime.  . fexofenadine (ALLEGRA) 180 MG tablet Take 180 mg by mouth daily.  . furosemide (LASIX) 40 MG tablet Take 1 tablet (40 mg total) by mouth daily.  . hydrocortisone (ANUSOL-HC) 25 MG suppository Place 25 mg rectally 2 (two) times daily.  Marland Kitchen. lisinopril (PRINIVIL,ZESTRIL) 2.5 MG tablet Take 1 tablet (2.5 mg total) by mouth daily.  . metoprolol tartrate (LOPRESSOR) 25 MG tablet Take 1 tablet (25 mg total) by mouth 2 (two) times daily.  . Omega-3 Fatty Acids (FISH OIL) 1000 MG CAPS Take 1,000 mg by mouth daily.  . polyethylene glycol (MIRALAX) packet Take 17 g by mouth daily.  . potassium chloride SA (K-DUR,KLOR-CON) 20 MEQ tablet Take 1 tablet (20 mEq total) by mouth daily.  . timolol (BETIMOL) 0.5 % ophthalmic solution Place 1 drop into the right eye 2 (two) times daily.   . vitamin B-12 (CYANOCOBALAMIN) 1000 MCG tablet Take 1,000 mcg by mouth at bedtime.     Current inpatient meds: Medications reviewed and reconciled.  Current Facility-Administered Medications  Medication Dose Route Frequency Provider Last Rate Last Dose  . 0.9 %  sodium chloride infusion   Intravenous Continuous Sheryl NickAllen, Anthony, MD       Current Outpatient Prescriptions  Medication Sig Dispense Refill  . ALPRAZolam (XANAX) 0.25 MG tablet Take 0.5 tablets (0.125 mg total) by mouth 3 (three) times daily as needed for anxiety. (Patient taking differently: Take 0.125 mg by mouth 2 (two) times daily. One half in the morning One half at night) 30 tablet 0  . aspirin EC 325 MG EC tablet Take 1 tablet (325 mg total) by mouth daily. 30 tablet 0  . esomeprazole (NEXIUM 24HR) 20 MG  capsule Take 1 capsule (20 mg total) by mouth at bedtime. 30 capsule 3  . fexofenadine (ALLEGRA) 180 MG tablet Take 180 mg by mouth daily.    . furosemide (LASIX) 40 MG tablet Take 1 tablet (40 mg total) by mouth daily. 30 tablet 3  . hydrocortisone (ANUSOL-HC) 25 MG suppository Place 25 mg rectally 2 (two) times daily.    Marland Kitchen. lisinopril (PRINIVIL,ZESTRIL) 2.5 MG tablet Take 1 tablet (2.5 mg total) by mouth daily. 30 tablet 3  . metoprolol tartrate (LOPRESSOR) 25 MG tablet Take 1 tablet (25 mg total) by mouth 2 (two) times daily. 60 tablet 3  . Omega-3 Fatty  Acids (FISH OIL) 1000 MG CAPS Take 1,000 mg by mouth daily.    . polyethylene glycol (MIRALAX) packet Take 17 g by mouth daily. 14 each 0  . potassium chloride SA (K-DUR,KLOR-CON) 20 MEQ tablet Take 1 tablet (20 mEq total) by mouth daily. 30 tablet 3  . timolol (BETIMOL) 0.5 % ophthalmic solution Place 1 drop into the right eye 2 (two) times daily.     . vitamin B-12 (CYANOCOBALAMIN) 1000 MCG tablet Take 1,000 mcg by mouth at bedtime.       Allergies: Allergies  Allergen Reactions  . Other Hives    Mycins-   Pt has Glaucoma in Right eye, Blind in Left eye .... PLEASE DO NOT GIVE ANYTHING TO PATIENT THAT MIGHT DESTROY VISION   . Prednisone Other (See Comments)    Altered mental status  . Statins Other (See Comments)    MYALGIAS Weakness, muscle aches, and pain  . Erythromycin     UNSPECIFIED REACTION   . Tylenol [Acetaminophen] Other (See Comments)    Hallucinations    ROS: As per HPI. A full 14-point review of systems was performed and is otherwise unremarkable.   PE:  BP 123/77   Pulse (!) 51   Temp 97.9 F (36.6 C) (Oral)   Resp 15   Ht 4\' 7"  (1.397 m)   Wt 74.8 kg (165 lb)   SpO2 100%   BMI 38.35 kg/m   General: WDWN, no acute distress. AAO x4. Speech clear, no dysarthria. No aphasia. Follows commands briskly. Affect is bright with congruent mood. Comportment is normal.  HEENT: Normocephalic. Neck supple without  LAD. MMM, OP clear. Dentition good. Sclerae anicteric. No conjunctival injection. Scarring of the left cornea is present. CV: Regular, no murmur. Carotid pulses full and symmetric, no bruits. Distal pulses 2+ and symmetric.  Lungs: CTAB.  Abdomen: Soft, obese, non-distended, non-tender. Bowel sounds present x4.  Extremities: No C/C/E. Neuro:  CN: The right pupil is reactive from 3-2 mm. Visual fields are normal in the right eye. On the left, corneal scarring clouds the pupil and this cannot be assessed. She reports no vision in the left eye and this is chronic. Extraocular movements are intact. Facial sensation is notable for decreased light touch and pinprick over the left jaw and cheek with midline splitting. Face is symmetric at rest with normal strength and mobility. Hearing is intact to conversational voice. Palate elevates symmetrically and uvula is midline. Voice is normal in tone, pitch and quality. Bilateral SCM and trapezii are 5/5. Tongue is midline with normal bulk and mobility.  Motor: Normal bulk, tone, and strength, including all intrinsic muscles of the right hand. No tremor or other abnormal movements. No drift.  Sensation: She reports decreased light touch over the left lower leg and foot. Pinprick is reduced in the same territory. In addition, she has decreased pinprick along the ulnar aspect of the right wrist and hand as well as the palmar aspect of the right fifth digit. There is decreased pinprick along the ulnar half of the right fourth digit.  DTRs: 2+, symmetric with the exception of absent ankle jerks bilaterally. Toes downgoing bilaterally. No pathologic reflexes.  Coordination: Finger-to-nose and heel-to-shin are without dysmetria. Finger taps are normal in amplitude and speed, no decrement.    Labs:  Lab Results  Component Value Date   WBC 4.3 07/30/2016   HGB 12.0 07/30/2016   HCT 36.5 07/30/2016   PLT 225 07/30/2016   GLUCOSE 117 (H) 07/30/2016  CHOL 216 (H)  06/05/2016   TRIG 128 06/05/2016   HDL 36 (L) 06/05/2016   LDLCALC 154 (H) 06/05/2016   ALT 14 07/28/2016   AST 25 07/28/2016   NA 134 (L) 07/30/2016   K 3.8 07/30/2016   CL 100 (L) 07/30/2016   CREATININE 1.04 (H) 07/30/2016   BUN 10 07/30/2016   CO2 25 07/30/2016   INR 0.99 07/28/2016   HGBA1C 5.4 06/08/2016    Imaging:  I have personally and independently reviewed the MRI of the brain from 07/28/16. This shows a moderate degree of chronic small vessel ischemic change in the bihemispheric white matter and pons. Mild to moderate diffuse generalized atrophy is present. No acute abnormalities seen. Visualized flow voids appear unremarkable.  Assessment and Plan:  1. Numbness: This is nonspecific and for the most part really doesn't fit any particular neurologic distribution. The exception is what appears to be an ulnar mononeuropathy of the right hand, discussed further below. No need for further imaging at this time. I agree with outpatient neurologic follow-up for further investigation which may include EMG/NCV. I suspect that her underlying anxiety is contributing to her subjective sensory changes. She may benefit from referral to mental health services for better treatment of her her anxiety.  2. Right ulnar neuropathy: Her exam shows decreased sensation in the ulnar distribution on the right. Chronicity of the symptoms is uncertain. This pattern of numbness is not what she was complaining about when she told me that her hand was numb. Recommend outpatient neurology follow up, consider EMG/NCV.  3. Cerebrovascular disease: MRI scan showed chronic small vessel ischemic disease. No acute stroke. Known risk factors include hypertension, "prediabetes," carotid artery disease, reported history of TIA/stroke. Continue risk factor modification. Continue aspirin. She has reported allergy to statins so these are not being used.  This was discussed with the patient and her daughter. Education was  provided on the diagnosis and expected evaluation and treatment. They are in agreement with the plan as noted. They were given the opportunity to ask any questions and these were addressed to their satisfaction.   I discussed the above impression and recommendations with the ED attending, Dr. Freida Busman.  At this point, I have no further recommendations. The patient may be discharged home with outpatient neurology follow up as previously mentioned.

## 2016-07-30 NOTE — ED Provider Notes (Signed)
[facility]-EMERGENCY DEPT Provider Note   CSN: [id number] Arrival date & time: 07/30/16  0917     History   Chief Complaint Chief Complaint  Patient presents with  . Numbness  . Chest Pain    HPI ARRIETTY DERCOLE is a 76 y.o. female.  76 year old female presents with right face and right foot numbness which began when she woke this morning at approximately 7:30. Was seen 2 days ago for left-sided facial paresthesias and was worked up with an MRI of her brain as well as neurology consult. No etiology of her symptoms was noted at that time. She also complains of intermittent sharp chest pain which she says she has had for some time which is unchanged. The sharp chest pain last for seconds and is not associated with dizziness, diaphoresis, nausea or vomiting. States that she is not very concerned about that. Patient states that the main reason for visit today was to make sure that she wasn't having a stroke. The right foot paresthesia has been persistent and over the entire foot. Denies any back pain. No bowel or bladder dysfunction. The right facial paresthesias localized to right below the right lip. She notes no trouble swallowing. Denies any new visual changes. No headaches noted. Nothing makes her symptoms better worse. No tumor use prior to arrival.      Past Medical History:  Diagnosis Date  . Anxiety   . Blindness of left eye   . Complication of anesthesia    has gas trapped in her body after  . DVT (deep venous thrombosis) (HCC)   . Ear problems   . Family history of adverse reaction to anesthesia    daughter has gas trapped in her body postop as well  . Glaucoma   . Hypertension   . Pre-diabetes    pt. told by [facility] staff that she is prediabetic, HgbA1c was 5.4  . Stroke [facility])    having TIA's    Patient Active Problem List   Diagnosis Date Noted  . Constipation 06/26/2016  . 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 Coronary artery disease   . NSTEMI (non-ST elevated myocardial  infarction) (HCC) 06/07/2016  . Paresthesia   . TIA (transient ischemic attack) 06/04/2016  . Symptomatic carotid artery stenosis, left 05/29/2016  . Slurred speech 04/18/2016  . Hypertension, essential 04/18/2016  . Hyperlipidemia 04/18/2016  . Chronic anxiety 04/18/2016    Past Surgical History:  Procedure Laterality Date  . BREAST SURGERY Left    cyst- L breast  . CHOLECYSTECTOMY    . CORONARY ARTERY BYPASS GRAFT N/A 06/09/2016   Procedure: CORONARY ARTERY BYPASS GRAFTING (CABG) times 4 using the left internal mammary artery and bilateral thigh greater saphenous veins harvested endoscopically. LIMA to LAD, SVG to DIAGONAL 2, SVG to OM, SVG to RCA;  Surgeon: Delight Ovens, MD;  Location: [facility] OR;  Service: Open Heart Surgery;  Laterality: N/A;  . ENDARTERECTOMY Left 05/29/2016   Procedure: LEFT CAROTID ENDARTERECTOMY WITH PATCH ANGIOPLASTY;  Surgeon: Larina Earthly, MD;  Location: [facility] OR;  Service: Vascular;  Laterality: Left;  . EYE SURGERY    . LEFT HEART CATH AND CORONARY ANGIOGRAPHY N/A 06/07/2016   Procedure: Left Heart Cath and Coronary Angiography;  Surgeon: Iran Ouch, MD;  Location: [facility] INVASIVE CV LAB;  Service: Cardiovascular;  Laterality: N/A;  . TEE WITHOUT CARDIOVERSION N/A 06/09/2016   Procedure: TRANSESOPHAGEAL ECHOCARDIOGRAM (TEE);  Surgeon: Delight Ovens, MD;  Location: [facility] OR;  Service: Open Heart Surgery;  Laterality: N/A;    OB History    No data available       Home Medications    Prior to Admission medications   Medication Sig Start Date End Date Taking? Authorizing Provider  ALPRAZolam (XANAX) 0.25 MG tablet Take 0.5 tablets (0.125 mg total) by mouth 3 (three) times daily as needed for anxiety. Patient taking differently: Take 0.125 mg by mouth 2 (two) times daily. One half in the morning One half at night 06/15/16   Barrett, Rae Roamrin R, PA-C  aspirin EC 325 MG EC tablet Take 1 tablet (325 mg total) by mouth daily. 06/15/16   Barrett, Erin R, PA-C    esomeprazole (NEXIUM 24HR) 20 MG capsule Take 1 capsule (20 mg total) by mouth at bedtime. 06/30/16   Jodelle GrossLawrence, Kathryn M, NP  fexofenadine (ALLEGRA) 180 MG tablet Take 180 mg by mouth daily.    [provider]  furosemide (LASIX) 40 MG tablet Take 1 tablet (40 mg total) by mouth daily. 06/30/16   Jodelle GrossLawrence, Kathryn M, NP  hydrocortisone (ANUSOL-HC) 25 MG suppository Place 25 mg rectally 2 (two) times daily.    [provider]  lisinopril (PRINIVIL,ZESTRIL) 2.5 MG tablet Take 1 tablet (2.5 mg total) by mouth daily. 06/30/16   Jodelle GrossLawrence, Kathryn M, NP  metoprolol tartrate (LOPRESSOR) 25 MG tablet Take 1 tablet (25 mg total) by mouth 2 (two) times daily. 06/30/16   Jodelle GrossLawrence, Kathryn M, NP  Omega-3 Fatty Acids (FISH OIL) 1000 MG CAPS Take 1,000 mg by mouth daily.    [provider]  polyethylene glycol (MIRALAX) packet Take 17 g by mouth daily. 06/15/16   Barrett, Erin R, PA-C  potassium chloride SA (K-DUR,KLOR-CON) 20 MEQ tablet Take 1 tablet (20 mEq total) by mouth daily. 06/30/16   Jodelle GrossLawrence, Kathryn M, NP  timolol (BETIMOL) 0.5 % ophthalmic solution Place 1 drop into the right eye 2 (two) times daily.     [provider]  vitamin B-12 (CYANOCOBALAMIN) 1000 MCG tablet Take 1,000 mcg by mouth at bedtime.     [provider]    Family History Family History  Problem Relation Age of Onset  . Heart disease Sister   . Heart disease Son     Social History Social History  Substance Use Topics  . Smoking status: Never Smoker  . Smokeless tobacco: Never Used  . Alcohol use No     Allergies   Other; Prednisone; Statins; Erythromycin; and Tylenol [acetaminophen]   Review of Systems Review of Systems  All other systems reviewed and are negative.    Physical Exam Updated Vital Signs BP 112/73 (BP Location: Left Arm) Comment: Simultaneous filing. User may not have seen previous data.  Pulse (!) 53 Comment: Simultaneous filing. User may not have seen  previous data.  Temp 97.9 F (36.6 C) (Oral)   Resp 18 Comment: Simultaneous filing. User may not have seen previous data.  Ht 4\' 7"  (1.397 m)   Wt 74.8 kg   SpO2 99% Comment: Simultaneous filing. User may not have seen previous data.  BMI 38.35 kg/m   Physical Exam  Constitutional: She is oriented to person, place, and time. She appears well-developed and well-nourished.  Non-toxic appearance. No distress.  HENT:  Head: Normocephalic and atraumatic.  Eyes: Conjunctivae, EOM and lids are normal. Pupils are equal, round, and reactive to light.  Neck: Normal range of motion. Neck supple. No tracheal deviation present. No thyroid mass present.  Cardiovascular: Normal rate, regular rhythm and normal heart sounds.  Exam reveals no gallop.   No murmur heard. Pulmonary/Chest: Effort normal and breath sounds normal. No stridor. No respiratory distress. She has no decreased breath sounds. She has no wheezes. She has no rhonchi. She has no rales.  Abdominal: Soft. Normal appearance and bowel sounds are normal. She exhibits no distension. There is no tenderness. There is no rebound and no CVA tenderness.  Musculoskeletal: Normal range of motion. She exhibits no edema or tenderness.  Neurological: She is alert and oriented to person, place, and time. She has normal strength. A sensory deficit is present. No cranial nerve deficit. GCS eye subscore is 4. GCS verbal subscore is 5. GCS motor subscore is 6.  Patient has subjective paresthesias to the entire left side of her face as well as below the right lip. She has no facial symmetry. Her speech is normal. She has no cerebellar findings. The right foot has subjective paresthesias. They are on the dorsum of the foot. Dorsalis pedis pulse 2+.  Skin: Skin is warm and dry. No abrasion and no rash noted.  Psychiatric: She has a normal mood and affect. Her speech is normal and behavior is normal.  Nursing note and vitals reviewed.    ED Treatments / Results   Labs (all labs ordered are listed, but only abnormal results are displayed) Labs Reviewed  CBC WITH DIFFERENTIAL/PLATELET  BASIC METABOLIC PANEL  I-STAT TROPOININ, ED    EKG  EKG Interpretation  Date/Time:  Sunday Jul 30 2016 09:32:57 EDT Ventricular Rate:  54 PR Interval:    QRS Duration: 97 QT Interval:  423 QTC Calculation: 401 R Axis:   56 Text Interpretation:  Sinus rhythm Borderline repolarization abnormality No significant change since last tracing Confirmed by Iasiah Ozment  MD, Greggory Safranek ([id number]) on 07/30/2016 9:58:02 AM       Radiology [name]  Result Date: 07/28/2016 CLINICAL DATA:  Left facial and leg numbness EXAM: MRI HEAD WITHOUT CONTRAST TECHNIQUE: Multiplanar, multiecho pulse sequences of the brain and surrounding structures were obtained without intravenous contrast. COMPARISON:  CT head 07/28/2016, MRI 06/05/2016 FINDINGS: Brain: Negative for acute infarct. Generalized atrophy with mild chronic microvascular ischemia in the white matter. Negative for mass or edema. Chronic microhemorrhage left external capsule. Vascular: Normal arterial flow void Skull and upper cervical spine: Negative Sinuses/Orbits: Mild mucosal edema in the mastoid tip bilaterally. Paranasal sinuses clear. Cataract removal on the left. Other: None IMPRESSION: Negative for acute infarct. Atrophy and mild chronic microvascular ischemia in the white matter. Electronically Signed   By: Marlan Palau M.D.   On: 07/28/2016 15:36   Ct Head Code Stroke W/o Cm  Result Date: 07/28/2016 CLINICAL DATA:  Code stroke. 76 year old female with acute onset lip and neck numbness less than an hour ago. EXAM: CT HEAD WITHOUT CONTRAST TECHNIQUE: Contiguous axial images were obtained from the base of the skull through the vertex without intravenous contrast. COMPARISON:  Brain MRI 06/05/2016 and earlier. FINDINGS: Brain: Stable cerebral volume. Small chronic posterior right cerebellar infarct again noted. Patchy  bilateral mostly periventricular white matter hypodensity appears stable. Stable gray-white matter differentiation throughout the brain. No acute intracranial hemorrhage identified. No midline shift, mass effect, or evidence of intracranial mass lesion. CED No cortically based acute infarct identified. Vascular: Calcified atherosclerosis at the skull base. No suspicious intracranial vascular hyperdensity. Skull: No acute osseous abnormality identified. Sinuses/Orbits: Visualized paranasal sinuses and mastoids are stable and well pneumatized. Other: No acute orbit or scalp soft tissue findings. ASPECTS [facility] Stroke Program Early CT  Score) - Ganglionic level infarction (caudate, lentiform nuclei, internal capsule, insula, M1-M3 cortex): 7 - Supraganglionic infarction (M4-M6 cortex): 3 Total score (0-10 with 10 being normal): 10 IMPRESSION: 1. Stable non contrast CT appearance of the brain since March. 2. ASPECTS is 10. 3. Study discussed by telephone with Dr. Derwood Kaplan on 07/28/2016 at 13:04 . Electronically Signed   By: Odessa Fleming M.D.   On: 07/28/2016 13:05    Procedures Procedures (including critical care time)  Medications Ordered in ED Medications  0.9 %  sodium chloride infusion (not administered)     Initial Impression / Assessment and Plan / ED Course  I have reviewed the triage vital signs and the nursing notes.  Pertinent labs & imaging results that were available during my care of the patient were reviewed by me and considered in my medical decision making (see chart for details).     Patient's cardiac enzymes are negative. States that she has had intermittent sharp chest pain for several months now and this is no different. She was seen by neurology who felt that there was component of anxiety to her current symptoms and recommended no new imaging. Family was informed of this and are agreeable be discharged home.  Final Clinical Impressions(s) / ED Diagnoses   Final diagnoses:    None    New Prescriptions New Prescriptions   No medications on file     Lorre Nick, MD 07/30/16 1338

## 2016-07-30 NOTE — ED Notes (Signed)
Neurologist at bedside. 

## 2016-08-01 DIAGNOSIS — F419 Anxiety disorder, unspecified: Secondary | ICD-10-CM | POA: Diagnosis not present

## 2016-08-01 DIAGNOSIS — H547 Unspecified visual loss: Secondary | ICD-10-CM | POA: Diagnosis not present

## 2016-08-01 DIAGNOSIS — Z48812 Encounter for surgical aftercare following surgery on the circulatory system: Secondary | ICD-10-CM | POA: Diagnosis not present

## 2016-08-01 DIAGNOSIS — I214 Non-ST elevation (NSTEMI) myocardial infarction: Secondary | ICD-10-CM | POA: Diagnosis not present

## 2016-08-01 DIAGNOSIS — Z8673 Personal history of transient ischemic attack (TIA), and cerebral infarction without residual deficits: Secondary | ICD-10-CM | POA: Diagnosis not present

## 2016-08-01 DIAGNOSIS — H409 Unspecified glaucoma: Secondary | ICD-10-CM | POA: Diagnosis not present

## 2016-08-01 DIAGNOSIS — I2510 Atherosclerotic heart disease of native coronary artery without angina pectoris: Secondary | ICD-10-CM | POA: Diagnosis not present

## 2016-08-01 DIAGNOSIS — I10 Essential (primary) hypertension: Secondary | ICD-10-CM | POA: Diagnosis not present

## 2016-08-01 DIAGNOSIS — R7303 Prediabetes: Secondary | ICD-10-CM | POA: Diagnosis not present

## 2016-08-03 DIAGNOSIS — R7303 Prediabetes: Secondary | ICD-10-CM | POA: Diagnosis not present

## 2016-08-03 DIAGNOSIS — I214 Non-ST elevation (NSTEMI) myocardial infarction: Secondary | ICD-10-CM | POA: Diagnosis not present

## 2016-08-03 DIAGNOSIS — H547 Unspecified visual loss: Secondary | ICD-10-CM | POA: Diagnosis not present

## 2016-08-03 DIAGNOSIS — Z48812 Encounter for surgical aftercare following surgery on the circulatory system: Secondary | ICD-10-CM | POA: Diagnosis not present

## 2016-08-03 DIAGNOSIS — Z8673 Personal history of transient ischemic attack (TIA), and cerebral infarction without residual deficits: Secondary | ICD-10-CM | POA: Diagnosis not present

## 2016-08-03 DIAGNOSIS — H409 Unspecified glaucoma: Secondary | ICD-10-CM | POA: Diagnosis not present

## 2016-08-03 DIAGNOSIS — I10 Essential (primary) hypertension: Secondary | ICD-10-CM | POA: Diagnosis not present

## 2016-08-03 DIAGNOSIS — I2510 Atherosclerotic heart disease of native coronary artery without angina pectoris: Secondary | ICD-10-CM | POA: Diagnosis not present

## 2016-08-03 DIAGNOSIS — F419 Anxiety disorder, unspecified: Secondary | ICD-10-CM | POA: Diagnosis not present

## 2016-08-04 DIAGNOSIS — Z8673 Personal history of transient ischemic attack (TIA), and cerebral infarction without residual deficits: Secondary | ICD-10-CM | POA: Diagnosis not present

## 2016-08-04 DIAGNOSIS — F419 Anxiety disorder, unspecified: Secondary | ICD-10-CM | POA: Diagnosis not present

## 2016-08-04 DIAGNOSIS — Z48812 Encounter for surgical aftercare following surgery on the circulatory system: Secondary | ICD-10-CM | POA: Diagnosis not present

## 2016-08-04 DIAGNOSIS — I10 Essential (primary) hypertension: Secondary | ICD-10-CM | POA: Diagnosis not present

## 2016-08-04 DIAGNOSIS — I2510 Atherosclerotic heart disease of native coronary artery without angina pectoris: Secondary | ICD-10-CM | POA: Diagnosis not present

## 2016-08-04 DIAGNOSIS — R7303 Prediabetes: Secondary | ICD-10-CM | POA: Diagnosis not present

## 2016-08-04 DIAGNOSIS — H409 Unspecified glaucoma: Secondary | ICD-10-CM | POA: Diagnosis not present

## 2016-08-04 DIAGNOSIS — I214 Non-ST elevation (NSTEMI) myocardial infarction: Secondary | ICD-10-CM | POA: Diagnosis not present

## 2016-08-04 DIAGNOSIS — H547 Unspecified visual loss: Secondary | ICD-10-CM | POA: Diagnosis not present

## 2016-08-08 DIAGNOSIS — H409 Unspecified glaucoma: Secondary | ICD-10-CM | POA: Diagnosis not present

## 2016-08-08 DIAGNOSIS — R7303 Prediabetes: Secondary | ICD-10-CM | POA: Diagnosis not present

## 2016-08-08 DIAGNOSIS — H547 Unspecified visual loss: Secondary | ICD-10-CM | POA: Diagnosis not present

## 2016-08-08 DIAGNOSIS — Z8673 Personal history of transient ischemic attack (TIA), and cerebral infarction without residual deficits: Secondary | ICD-10-CM | POA: Diagnosis not present

## 2016-08-08 DIAGNOSIS — F419 Anxiety disorder, unspecified: Secondary | ICD-10-CM | POA: Diagnosis not present

## 2016-08-08 DIAGNOSIS — I10 Essential (primary) hypertension: Secondary | ICD-10-CM | POA: Diagnosis not present

## 2016-08-08 DIAGNOSIS — I2510 Atherosclerotic heart disease of native coronary artery without angina pectoris: Secondary | ICD-10-CM | POA: Diagnosis not present

## 2016-08-08 DIAGNOSIS — Z48812 Encounter for surgical aftercare following surgery on the circulatory system: Secondary | ICD-10-CM | POA: Diagnosis not present

## 2016-08-08 DIAGNOSIS — I214 Non-ST elevation (NSTEMI) myocardial infarction: Secondary | ICD-10-CM | POA: Diagnosis not present

## 2016-08-10 ENCOUNTER — Ambulatory Visit (INDEPENDENT_AMBULATORY_CARE_PROVIDER_SITE_OTHER): Payer: Medicare HMO | Admitting: Neurology

## 2016-08-10 ENCOUNTER — Encounter: Payer: Self-pay | Admitting: Neurology

## 2016-08-10 VITALS — BP 120/94 | HR 60 | Ht <= 58 in | Wt 168.5 lb

## 2016-08-10 DIAGNOSIS — R202 Paresthesia of skin: Secondary | ICD-10-CM

## 2016-08-10 DIAGNOSIS — I10 Essential (primary) hypertension: Secondary | ICD-10-CM | POA: Diagnosis not present

## 2016-08-10 DIAGNOSIS — J309 Allergic rhinitis, unspecified: Secondary | ICD-10-CM | POA: Diagnosis not present

## 2016-08-10 DIAGNOSIS — F419 Anxiety disorder, unspecified: Secondary | ICD-10-CM

## 2016-08-10 DIAGNOSIS — K219 Gastro-esophageal reflux disease without esophagitis: Secondary | ICD-10-CM | POA: Diagnosis not present

## 2016-08-10 DIAGNOSIS — Z1389 Encounter for screening for other disorder: Secondary | ICD-10-CM | POA: Diagnosis not present

## 2016-08-10 DIAGNOSIS — Z6834 Body mass index (BMI) 34.0-34.9, adult: Secondary | ICD-10-CM | POA: Diagnosis not present

## 2016-08-10 DIAGNOSIS — I2510 Atherosclerotic heart disease of native coronary artery without angina pectoris: Secondary | ICD-10-CM | POA: Diagnosis not present

## 2016-08-10 NOTE — Progress Notes (Signed)
Reason for visit: Numbness  Referring physician: Marty  Sheryl Carter is a 76 y.o. female  History of present illness:  Sheryl Carter is a 76 year old white female with a history of cerebrovascular disease and coronary artery disease. The patient has had several hospital admissions over the last several months. She was noted in January 2018 to have symptoms of a possible TIA. The patient had some problems with slurred speech and blurred vision and difficulty getting words out. The patient also reported some dizziness. 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 The patient was set up for a left carotid endarterectomy that was done on 05/29/2016. In retrospect, the patient likely had a postoperative myocardial infarction. She was admitted back to the hospital on 06/04/2016 with onset of bilateral foot numbness, slurred speech and confusion. During that admission, she was noted to have elevation in troponin levels to 3.32. She was seen by cardiology and eventually by vascular surgery. The patient subsequently underwent a three-vessel bypass procedure, and she went from there to an extended care facility. The patient has returned home in the last month to live with her daughter. She is now using a walker for ambulation. She has had several visits to the emergency room, one on 07/28/2016 with left face and left foot numbness, and she returned on 07/30/2016 with right face and right foot numbness. The patient has undergone MRI evaluation of the brain that shows no evidence of acute cerebrovascular disease. 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 Otherwise, intracranial circulation is unremarkable. The patient is on antiplatelet agents currently. She has had a history of vertigo off and on throughout her entire life, currently she is not complaining of any dizziness,  speech problems, swallowing problems, or any weakness of the extremities. She denies issues controlling the bowels or the bladder. She may be developing a problem with memory. She is sent to this office for further evaluation. She has a significant underlying issue with anxiety, she may have mild panic attacks.  Past Medical History:  Diagnosis Date  . Anxiety   . Blindness of left eye   . Complication of anesthesia    has gas trapped in her body after  . DVT (deep venous thrombosis) (HCC)   . Ear problems   . Family history of adverse reaction to anesthesia    daughter has gas trapped in her body postop as well  . Glaucoma   . Hypercholesterolemia   . Hypertension   . Pre-diabetes    pt. told by [facility] staff that she is prediabetic, HgbA1c was 5.4  . Stroke (HCC)    having TIA's    Past Surgical History:  Procedure Laterality Date  . BREAST SURGERY Left    cyst- L breast  . CHOLECYSTECTOMY    . CORONARY ARTERY BYPASS GRAFT N/A 06/09/2016   Procedure: CORONARY ARTERY BYPASS GRAFTING (CABG) times 4 using the left internal mammary artery and bilateral thigh greater saphenous veins harvested endoscopically. LIMA to LAD, SVG to DIAGONAL 2, SVG to OM, SVG to RCA;  Surgeon: Delight Ovens, MD;  Location: [facility] OR;  Service: Open Heart Surgery;  Laterality: N/A;  . ENDARTERECTOMY Left 05/29/2016   Procedure: LEFT CAROTID ENDARTERECTOMY WITH PATCH ANGIOPLASTY;  Surgeon: Larina Earthly, MD;  Location: [facility] OR;  Service: Vascular;  Laterality: Left;  . EYE SURGERY Right  cataract  . LEFT HEART CATH AND CORONARY ANGIOGRAPHY N/A 06/07/2016   Procedure: Left Heart Cath and Coronary Angiography;  Surgeon: Iran OuchMuhammad A Arida, MD;  Location: [facility] INVASIVE CV LAB;  Service: Cardiovascular;  Laterality: N/A;  . TEE WITHOUT CARDIOVERSION N/A 06/09/2016   Procedure: TRANSESOPHAGEAL ECHOCARDIOGRAM (TEE);  Surgeon: Delight OvensEdward B Gerhardt, MD;  Location[facility] OR;  Service: Open Heart Surgery;  Laterality: N/A;    Family  History  Problem Relation Age of Onset  . Heart disease Sister   . Heart disease Son     Social history:  reports that she has never smoked. She has never used smokeless tobacco. She reports that she does not drink alcohol or use drugs.  Medications:  Prior to Admission medications   Medication Sig Start Date End Date Taking? Authorizing Provider  ALPRAZolam (XANAX) 0.25 MG tablet Take 0.5 tablets (0.125 mg total) by mouth 3 (three) times daily as needed for anxiety. Patient taking differently: Take 0.125 mg by mouth 2 (two) times daily. One half in the morning One half at night 06/15/16  Yes Barrett, Erin R, PA-C  aspirin EC 325 MG EC tablet Take 1 tablet (325 mg total) by mouth daily. 06/15/16  Yes Barrett, Erin R, PA-C  esomeprazole (NEXIUM 24HR) 20 MG capsule Take 1 capsule (20 mg total) by mouth at bedtime. 06/30/16  Yes Jodelle GrossLawrence, Kathryn M, NP  fexofenadine (ALLEGRA) 180 MG tablet Take 180 mg by mouth daily.   Yes [provider]  furosemide (LASIX) 40 MG tablet Take 1 tablet (40 mg total) by mouth daily. 06/30/16  Yes Jodelle GrossLawrence, Kathryn M, NP  hydrocortisone (ANUSOL-HC) 25 MG suppository Place 25 mg rectally 2 (two) times daily.   Yes [provider]  lisinopril (PRINIVIL,ZESTRIL) 2.5 MG tablet Take 1 tablet (2.5 mg total) by mouth daily. 06/30/16  Yes Jodelle GrossLawrence, Kathryn M, NP  metoprolol tartrate (LOPRESSOR) 25 MG tablet Take 1 tablet (25 mg total) by mouth 2 (two) times daily. 06/30/16  Yes Jodelle GrossLawrence, Kathryn M, NP  Omega-3 Fatty Acids (FISH OIL) 1000 MG CAPS Take 1,000 mg by mouth daily.   Yes [provider]  polyethylene glycol (MIRALAX) packet Take 17 g by mouth daily. 06/15/16  Yes Barrett, Erin R, PA-C  potassium chloride SA (K-DUR,KLOR-CON) 20 MEQ tablet Take 1 tablet (20 mEq total) by mouth daily. 06/30/16  Yes Jodelle GrossLawrence, Kathryn M, NP  timolol (BETIMOL) 0.5 % ophthalmic solution Place 1 drop into the right eye 2 (two) times daily.    Yes [provider]    vitamin B-12 (CYANOCOBALAMIN) 1000 MCG tablet Take 1,000 mcg by mouth at bedtime.    Yes [provider]      Allergies  Allergen Reactions  . Other Hives    Mycins-   Pt has Glaucoma in Right eye, Blind in Left eye .... PLEASE DO NOT GIVE ANYTHING TO PATIENT THAT MIGHT DESTROY VISION   . Prednisone Other (See Comments)    Altered mental status  . Statins Other (See Comments)    MYALGIAS Weakness, muscle aches, and pain  . Erythromycin     UNSPECIFIED REACTION   . Tylenol [Acetaminophen] Other (See Comments)    Hallucinations    ROS:  Out of a complete 14 system review of symptoms, the patient complains only of the following symptoms, and all other reviewed systems are negative.  Weight loss, fatigue Chest pain hearing loss Feeling cold Muscle cramps Memory loss, confusion, numbness, weakness Depression, anxiety Decreased energy, change in appetite  Blood pressure (!) 120/94, pulse 60, height 4' 7.5" (1.41 m), weight 168 lb 8 oz (76.4 kg).  Physical Exam  General: The patient is alert and cooperative at the time of the examination.The patient is moderately obese.  Eyes: Pupils on the right is round and reactive to light. Disc on the right is flat. The patient is blind in the left eye, corneal opacity noted.  Neck: The neck is supple, no carotid bruits are noted.  Respiratory: The respiratory examination is clear.  Cardiovascular: The cardiovascular examination reveals a regular rate and rhythm, no obvious murmurs or rubs are noted.  Skin: Extremities are without significant edema.  Neurologic Exam  Mental status: The patient is alert and oriented x 3 at the time of the examination. The patient has apparent normal recent and remote memory, with an apparently normal attention span and concentration ability.  Cranial nerves: Facial symmetry is present. There is good sensation of the face to pinprick and soft touch bilaterally. The strength of the facial  muscles and the muscles to head turning and shoulder shrug are normal bilaterally. Speech is well enunciated, no aphasia or dysarthria is noted. Extraocular movements are full. Visual fields are full. The tongue is midline, and the patient has symmetric elevation of the soft palate. No obvious hearing deficits are noted.  Motor: The motor testing reveals 5 over 5 strength of all 4 extremities. Good symmetric motor tone is noted throughout.  Sensory: Sensory testing is intact to pinprick, soft touch, vibration sensation, and position sense on all 4 extremities. No evidence of extinction is noted.  Coordination: Cerebellar testing reveals good finger-nose-finger and heel-to-shin bilaterally.  Gait and station: Gait is  slightly wide-based. Tandem gait is unsteady. Romberg is negative. No drift is seen.  Reflexes: Deep tendon reflexes are symmetric and normal bilaterally. Toes are downgoing bilaterally.   Assessment/Plan:  1. Cerebrovascular disease, left carotid enterectomy   2. Coronary artery disease, recent 3 vessel bypass procedure   3. 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 The patient has had alternating numbness of the right and the left side. This may be a manifestation of anxiety. The patient has also in the past presented with some tremors on the right side, EEG study in March was normal. At this point, I would not pursue any further evaluation, the patient will remain on her antiplatelet agents. We will follow-up in 6 months to follow her memory issue.    Marlan Palau MD 08/10/2016 9:31 AM  [facility] [address] [place], [place] [address]  Phone [phone] Fax [phone]

## 2016-08-11 DIAGNOSIS — F419 Anxiety disorder, unspecified: Secondary | ICD-10-CM | POA: Diagnosis not present

## 2016-08-11 DIAGNOSIS — H547 Unspecified visual loss: Secondary | ICD-10-CM | POA: Diagnosis not present

## 2016-08-11 DIAGNOSIS — Z8673 Personal history of transient ischemic attack (TIA), and cerebral infarction without residual deficits: Secondary | ICD-10-CM | POA: Diagnosis not present

## 2016-08-11 DIAGNOSIS — I2510 Atherosclerotic heart disease of native coronary artery without angina pectoris: Secondary | ICD-10-CM | POA: Diagnosis not present

## 2016-08-11 DIAGNOSIS — Z48812 Encounter for surgical aftercare following surgery on the circulatory system: Secondary | ICD-10-CM | POA: Diagnosis not present

## 2016-08-11 DIAGNOSIS — I214 Non-ST elevation (NSTEMI) myocardial infarction: Secondary | ICD-10-CM | POA: Diagnosis not present

## 2016-08-11 DIAGNOSIS — H409 Unspecified glaucoma: Secondary | ICD-10-CM | POA: Diagnosis not present

## 2016-08-11 DIAGNOSIS — I10 Essential (primary) hypertension: Secondary | ICD-10-CM | POA: Diagnosis not present

## 2016-08-11 DIAGNOSIS — R7303 Prediabetes: Secondary | ICD-10-CM | POA: Diagnosis not present

## 2016-08-15 DIAGNOSIS — Z48812 Encounter for surgical aftercare following surgery on the circulatory system: Secondary | ICD-10-CM | POA: Diagnosis not present

## 2016-08-15 DIAGNOSIS — I2510 Atherosclerotic heart disease of native coronary artery without angina pectoris: Secondary | ICD-10-CM | POA: Diagnosis not present

## 2016-08-15 DIAGNOSIS — F419 Anxiety disorder, unspecified: Secondary | ICD-10-CM | POA: Diagnosis not present

## 2016-08-15 DIAGNOSIS — H409 Unspecified glaucoma: Secondary | ICD-10-CM | POA: Diagnosis not present

## 2016-08-15 DIAGNOSIS — I10 Essential (primary) hypertension: Secondary | ICD-10-CM | POA: Diagnosis not present

## 2016-08-15 DIAGNOSIS — R7303 Prediabetes: Secondary | ICD-10-CM | POA: Diagnosis not present

## 2016-08-15 DIAGNOSIS — Z8673 Personal history of transient ischemic attack (TIA), and cerebral infarction without residual deficits: Secondary | ICD-10-CM | POA: Diagnosis not present

## 2016-08-15 DIAGNOSIS — H547 Unspecified visual loss: Secondary | ICD-10-CM | POA: Diagnosis not present

## 2016-08-15 DIAGNOSIS — I214 Non-ST elevation (NSTEMI) myocardial infarction: Secondary | ICD-10-CM | POA: Diagnosis not present

## 2016-08-18 DIAGNOSIS — Z8673 Personal history of transient ischemic attack (TIA), and cerebral infarction without residual deficits: Secondary | ICD-10-CM | POA: Diagnosis not present

## 2016-08-18 DIAGNOSIS — I10 Essential (primary) hypertension: Secondary | ICD-10-CM | POA: Diagnosis not present

## 2016-08-18 DIAGNOSIS — I214 Non-ST elevation (NSTEMI) myocardial infarction: Secondary | ICD-10-CM | POA: Diagnosis not present

## 2016-08-18 DIAGNOSIS — Z48812 Encounter for surgical aftercare following surgery on the circulatory system: Secondary | ICD-10-CM | POA: Diagnosis not present

## 2016-08-18 DIAGNOSIS — R7303 Prediabetes: Secondary | ICD-10-CM | POA: Diagnosis not present

## 2016-08-18 DIAGNOSIS — H409 Unspecified glaucoma: Secondary | ICD-10-CM | POA: Diagnosis not present

## 2016-08-18 DIAGNOSIS — F419 Anxiety disorder, unspecified: Secondary | ICD-10-CM | POA: Diagnosis not present

## 2016-08-18 DIAGNOSIS — H547 Unspecified visual loss: Secondary | ICD-10-CM | POA: Diagnosis not present

## 2016-08-18 DIAGNOSIS — I2510 Atherosclerotic heart disease of native coronary artery without angina pectoris: Secondary | ICD-10-CM | POA: Diagnosis not present

## 2016-08-22 DIAGNOSIS — I2510 Atherosclerotic heart disease of native coronary artery without angina pectoris: Secondary | ICD-10-CM | POA: Diagnosis not present

## 2016-08-22 DIAGNOSIS — Z48812 Encounter for surgical aftercare following surgery on the circulatory system: Secondary | ICD-10-CM | POA: Diagnosis not present

## 2016-08-22 DIAGNOSIS — I10 Essential (primary) hypertension: Secondary | ICD-10-CM | POA: Diagnosis not present

## 2016-08-22 DIAGNOSIS — H547 Unspecified visual loss: Secondary | ICD-10-CM | POA: Diagnosis not present

## 2016-08-22 DIAGNOSIS — H409 Unspecified glaucoma: Secondary | ICD-10-CM | POA: Diagnosis not present

## 2016-08-22 DIAGNOSIS — F419 Anxiety disorder, unspecified: Secondary | ICD-10-CM | POA: Diagnosis not present

## 2016-08-22 DIAGNOSIS — I214 Non-ST elevation (NSTEMI) myocardial infarction: Secondary | ICD-10-CM | POA: Diagnosis not present

## 2016-08-22 DIAGNOSIS — Z8673 Personal history of transient ischemic attack (TIA), and cerebral infarction without residual deficits: Secondary | ICD-10-CM | POA: Diagnosis not present

## 2016-08-22 DIAGNOSIS — R7303 Prediabetes: Secondary | ICD-10-CM | POA: Diagnosis not present

## 2016-08-24 DIAGNOSIS — Z48812 Encounter for surgical aftercare following surgery on the circulatory system: Secondary | ICD-10-CM | POA: Diagnosis not present

## 2016-08-24 DIAGNOSIS — I2510 Atherosclerotic heart disease of native coronary artery without angina pectoris: Secondary | ICD-10-CM | POA: Diagnosis not present

## 2016-08-24 DIAGNOSIS — H547 Unspecified visual loss: Secondary | ICD-10-CM | POA: Diagnosis not present

## 2016-08-24 DIAGNOSIS — Z8673 Personal history of transient ischemic attack (TIA), and cerebral infarction without residual deficits: Secondary | ICD-10-CM | POA: Diagnosis not present

## 2016-08-24 DIAGNOSIS — I214 Non-ST elevation (NSTEMI) myocardial infarction: Secondary | ICD-10-CM | POA: Diagnosis not present

## 2016-08-24 DIAGNOSIS — R7303 Prediabetes: Secondary | ICD-10-CM | POA: Diagnosis not present

## 2016-08-24 DIAGNOSIS — I10 Essential (primary) hypertension: Secondary | ICD-10-CM | POA: Diagnosis not present

## 2016-08-24 DIAGNOSIS — F419 Anxiety disorder, unspecified: Secondary | ICD-10-CM | POA: Diagnosis not present

## 2016-08-24 DIAGNOSIS — H409 Unspecified glaucoma: Secondary | ICD-10-CM | POA: Diagnosis not present

## 2016-08-30 DIAGNOSIS — Z48812 Encounter for surgical aftercare following surgery on the circulatory system: Secondary | ICD-10-CM | POA: Diagnosis not present

## 2016-08-30 DIAGNOSIS — F419 Anxiety disorder, unspecified: Secondary | ICD-10-CM | POA: Diagnosis not present

## 2016-08-30 DIAGNOSIS — R7303 Prediabetes: Secondary | ICD-10-CM | POA: Diagnosis not present

## 2016-08-30 DIAGNOSIS — H409 Unspecified glaucoma: Secondary | ICD-10-CM | POA: Diagnosis not present

## 2016-08-30 DIAGNOSIS — H547 Unspecified visual loss: Secondary | ICD-10-CM | POA: Diagnosis not present

## 2016-08-30 DIAGNOSIS — I2510 Atherosclerotic heart disease of native coronary artery without angina pectoris: Secondary | ICD-10-CM | POA: Diagnosis not present

## 2016-08-30 DIAGNOSIS — I10 Essential (primary) hypertension: Secondary | ICD-10-CM | POA: Diagnosis not present

## 2016-08-30 DIAGNOSIS — I214 Non-ST elevation (NSTEMI) myocardial infarction: Secondary | ICD-10-CM | POA: Diagnosis not present

## 2016-08-30 DIAGNOSIS — Z8673 Personal history of transient ischemic attack (TIA), and cerebral infarction without residual deficits: Secondary | ICD-10-CM | POA: Diagnosis not present

## 2016-09-21 DIAGNOSIS — H524 Presbyopia: Secondary | ICD-10-CM | POA: Diagnosis not present

## 2016-09-21 DIAGNOSIS — H5203 Hypermetropia, bilateral: Secondary | ICD-10-CM | POA: Diagnosis not present

## 2016-09-21 DIAGNOSIS — H2511 Age-related nuclear cataract, right eye: Secondary | ICD-10-CM | POA: Diagnosis not present

## 2016-09-21 DIAGNOSIS — Z961 Presence of intraocular lens: Secondary | ICD-10-CM | POA: Diagnosis not present

## 2016-09-21 DIAGNOSIS — H401131 Primary open-angle glaucoma, bilateral, mild stage: Secondary | ICD-10-CM | POA: Diagnosis not present

## 2016-09-23 ENCOUNTER — Other Ambulatory Visit: Payer: Self-pay | Admitting: Adult Health

## 2016-09-29 ENCOUNTER — Encounter: Payer: Self-pay | Admitting: Adult Health

## 2016-09-29 ENCOUNTER — Ambulatory Visit (INDEPENDENT_AMBULATORY_CARE_PROVIDER_SITE_OTHER): Payer: Medicare HMO | Admitting: Adult Health

## 2016-09-29 VITALS — BP 122/62 | HR 50 | Ht <= 58 in | Wt 164.0 lb

## 2016-09-29 DIAGNOSIS — I2510 Atherosclerotic heart disease of native coronary artery without angina pectoris: Secondary | ICD-10-CM

## 2016-09-29 DIAGNOSIS — E785 Hyperlipidemia, unspecified: Secondary | ICD-10-CM

## 2016-09-29 DIAGNOSIS — I10 Essential (primary) hypertension: Secondary | ICD-10-CM | POA: Diagnosis not present

## 2016-09-29 MED ORDER — METOPROLOL TARTRATE 25 MG PO TABS: ORAL_TABLET | ORAL | 3 refills | Status: DC

## 2016-09-29 MED ORDER — AMLODIPINE BESYLATE 2.5 MG PO TABS: 2.5000 mg | ORAL_TABLET | Freq: Every day | ORAL | 3 refills | Status: DC

## 2016-09-29 MED ORDER — FUROSEMIDE 20 MG PO TABS: 20.0000 mg | ORAL_TABLET | Freq: Every day | ORAL | 3 refills | Status: DC

## 2016-09-29 NOTE — Patient Instructions (Signed)
Medication Instructions:  Decrease lasix to 20 mg daily  Stop lisinopril Start amlodipine 2.5 mg daily   Labwork: Your physician recommends that you return for lab work in: asap bmet Fasting lipids lft's   Testing/Procedures: none Follow-Up: Your physician wants you to follow-up in: 6 months .  You will receive a reminder letter in the mail two months in advance. If you don't receive a letter, please call our office to schedule the follow-up appointment.   Any Other Special Instructions Will Be Listed Below (If Applicable).     If you need a refill on your cardiac medications before your next appointment, please call your pharmacy.

## 2016-09-29 NOTE — Progress Notes (Signed)
Cardiology Office Note   Date:  09/29/2016   ID:  Sheryl, Carter March 23, 1941, MRN [id number]  PCP:  Sheryl Nevins, MD  Cardiologist:  Needs to be established  Chief Complaint  Patient presents with  . Coronary Artery Disease  . Hypertension      History of Present Illness: Sheryl Carter is a 76 y.o. female who presents for ongoing assessment and management coronary artery disease, status post coronary artery bypass grafting 4 (LIMA to LAD, SVG harvest of the bilateral thighs, SVG to diagonal 2, SVG to OM, SVG to RCA). CABG was performed in the setting of severe three-vessel coronary artery disease and distal left main disease.  The patient was last seen in the office on 06/30/2016. At that time multiple questions were asked and answered. She was to be established with a local cardiologist and this is not yet been done. She is here for follow-up to assess her progress posthospitalization with CABG. She was referred to cardiac rehabilitation.  She has chosen not to be dissipating cardiac rehabilitation. She comes today with continued complaints of soreness in her chest. She also states she's been having some tingling in her lips and tongue in the back or throat after taking lisinopril. She also has some tingling in her fingers. She states when she gets up and going during the day the tingling gets better in her arms and legs. Soreness in her chest is improved. But continues to have feelings of her lips swelling and tingling.  Past Medical History:  Diagnosis Date  . Anxiety   . Blindness of left eye   . Complication of anesthesia    has gas trapped in her body after  . DVT (deep venous thrombosis) (HCC)   . Ear problems   . Family history of adverse reaction to anesthesia    daughter has gas trapped in her body postop as well  . Glaucoma   . Hypercholesterolemia   . Hypertension   . Pre-diabetes    pt. told by [facility] staff that she is prediabetic, HgbA1c was 5.4  . Stroke  (HCC)    having TIA's    Past Surgical History:  Procedure Laterality Date  . BREAST SURGERY Left    cyst- L breast  . CHOLECYSTECTOMY    . CORONARY ARTERY BYPASS GRAFT N/A 06/09/2016   Procedure: CORONARY ARTERY BYPASS GRAFTING (CABG) times 4 using the left internal mammary artery and bilateral thigh greater saphenous veins harvested endoscopically. LIMA to LAD, SVG to DIAGONAL 2, SVG to OM, SVG to RCA;  Surgeon: Delight Ovens, MD;  Location: [facility] OR;  Service: Open Heart Surgery;  Laterality: N/A;  . ENDARTERECTOMY Left 05/29/2016   Procedure: LEFT CAROTID ENDARTERECTOMY WITH PATCH ANGIOPLASTY;  Surgeon: Larina Earthly, MD;  Location: [facility] OR;  Service: Vascular;  Laterality: Left;  . EYE SURGERY Right    cataract  . LEFT HEART CATH AND CORONARY ANGIOGRAPHY N/A 06/07/2016   Procedure: Left Heart Cath and Coronary Angiography;  Surgeon: Iran Ouch, MD;  Location: [facility] INVASIVE CV LAB;  Service: Cardiovascular;  Laterality: N/A;  . TEE WITHOUT CARDIOVERSION N/A 06/09/2016   Procedure: TRANSESOPHAGEAL ECHOCARDIOGRAM (TEE);  Surgeon: Delight Ovens, MD;  Location: [facility] OR;  Service: Open Heart Surgery;  Laterality: N/A;     Current Outpatient Prescriptions  Medication Sig Dispense Refill  . ALPRAZolam (XANAX) 0.25 MG tablet Take 0.5 tablets (0.125 mg total) by mouth 3 (three) times daily as needed for anxiety. (Patient taking differently:  Take 0.125 mg by mouth 2 (two) times daily. One half in the morning One half at night) 30 tablet 0  . aspirin EC 325 MG EC tablet Take 1 tablet (325 mg total) by mouth daily. 30 tablet 0  . esomeprazole (NEXIUM 24HR) 20 MG capsule Take 1 capsule (20 mg total) by mouth at bedtime. 30 capsule 3  . fexofenadine (ALLEGRA) 180 MG tablet Take 180 mg by mouth daily.    . hydrocortisone (ANUSOL-HC) 25 MG suppository Place 25 mg rectally 2 (two) times daily.    . metoprolol tartrate (LOPRESSOR) 25 MG tablet TAKE ONE TABLET BY MOUTH 2 TIMES A DAY. 180 tablet 3  .  Omega-3 Fatty Acids (FISH OIL) 1000 MG CAPS Take 1,000 mg by mouth daily.    . polyethylene glycol (MIRALAX) packet Take 17 g by mouth daily. 14 each 0  . potassium chloride SA (K-DUR,KLOR-CON) 20 MEQ tablet TAKE ONE TABLET BY MOUTH DAILY. 90 tablet 3  . timolol (BETIMOL) 0.5 % ophthalmic solution Place 1 drop into the right eye 2 (two) times daily.     . vitamin B-12 (CYANOCOBALAMIN) 1000 MCG tablet Take 1,000 mcg by mouth at bedtime.     Marland Kitchen amLODipine (NORVASC) 2.5 MG tablet Take 1 tablet (2.5 mg total) by mouth daily. 90 tablet 3  . furosemide (LASIX) 20 MG tablet Take 1 tablet (20 mg total) by mouth daily. 90 tablet 3   No current facility-administered medications for this visit.     Allergies:   Other; Prednisone; Statins; Erythromycin; and Tylenol [acetaminophen]    Social History:  The patient  reports that she has never smoked. She has never used smokeless tobacco. She reports that she does not drink alcohol or use drugs.   Family History:  The patient's family history includes Heart disease in her sister and son.    ROS: All other systems are reviewed and negative. Unless otherwise mentioned in H&P    PHYSICAL EXAM: VS:  BP 122/62   Pulse (!) 50   Ht 4\' 7"  (1.397 m)   Wt 164 lb (74.4 kg)   BMI 38.12 kg/m  , BMI Body mass index is 38.12 kg/m. GEN: Well nourished, well developed, in no acute distress  HEENT: normal  Neck: no JVD, carotid bruits, or masses Cardiac: RRR; no murmurs, rubs, or gallops,no edema  Respiratory:  clear to auscultation bilaterally, normal work of breathing GI: soft, nontender, nondistended, + BS MS: no deformity or atrophy Well healed sternotomy scar. Skin: warm and dry, no rash Neuro:  Strength and sensation are intact Psych: euthymic mood, full affect   Recent Labs: 06/10/2016: Magnesium 2.1 07/28/2016: ALT 14 07/30/2016: BUN 10; Creatinine, Ser 1.04; Hemoglobin 12.0; Platelets 225; Potassium 3.8; Sodium 134    Lipid Panel    Component  Value Date/Time   CHOL 216 (H) 06/05/2016 0222   TRIG 128 06/05/2016 0222   HDL 36 (L) 06/05/2016 0222   CHOLHDL 6.0 06/05/2016 0222   VLDL 26 06/05/2016 0222   LDLCALC 154 (H) 06/05/2016 0222      Wt Readings from Last 3 Encounters:  09/29/16 164 lb (74.4 kg)  08/10/16 168 lb 8 oz (76.4 kg)  07/30/16 165 lb (74.8 kg)      ASSESSMENT AND PLAN:  1. Hypertension: I'm going to discontinue lisinopril as she is having some issues with numbness tingling and feelings of her lip swelling and tongue tingling after taking it. This may not be allergic reaction to Ace, however because she  mentioned that point to discontinue it to see if it's better. I will start her on low-dose amlodipine instead to help with blood pressure control. I think she is on too much diuretic, she is on 40 mg of Lasix daily, with normal ejection fraction and no evidence of diastolic CHF. I'm going to decrease it to 20 mg daily. I've advised her that she may notice some puffiness in her ankles from addition of amlodipine.  2. Coronary artery disease: Status post coronary artery bypass grafting 4. She continues to have some soreness in her chest and in her legs from the saphenous vein graft site. She is slowly regaining her strength. I am disappointed that she did not she is to undergo cardiac rehabilitation as I believe this would've been more supportive of her rehabilitation, and helped her to feel more confident in her recovery. She was not interested when I brought it up a second time.  She will continue secondary prevention with beta blocker, aspirin. She is intolerant to statins causing myalgia. She is to be low cholesterol diet. Continue fish oil.  3. Anxiety: She is very anxious about her health and her recovery. Multiple questions have been answered. I'm going ahead and order some follow-up labs to include a BMET lipids and LFTs to help with continue management of her high blood pressure and coronary artery  disease.   Current medicines are reviewed at length with the patient today.    Labs/ tests ordered today include: BMET fasting lipids and LFTs  Bettey MareKathryn M. Liborio NixonLawrence DNP, ANP, AACC   09/29/2016 4:30 PM    [facility] [address], [place], [place] [address] Phone: [phone]; Fax: [phone]

## 2016-09-30 LAB — BASIC METABOLIC PANEL
BUN: 19 mg/dL (ref 7–25)
CO2: 25 mmol/L (ref 20–31)
Calcium: 9.4 mg/dL (ref 8.6–10.4)
Chloride: 105 mmol/L (ref 98–110)
Creat: 0.96 mg/dL — ABNORMAL HIGH (ref 0.60–0.93)
Glucose, Bld: 99 mg/dL (ref 65–99)
POTASSIUM: 4.3 mmol/L (ref 3.5–5.3)
SODIUM: 141 mmol/L (ref 135–146)

## 2016-09-30 LAB — HEPATIC FUNCTION PANEL
ALK PHOS: 79 U/L (ref 33–130)
ALT: 10 U/L (ref 6–29)
AST: 16 U/L (ref 10–35)
Albumin: 3.6 g/dL (ref 3.6–5.1)
BILIRUBIN DIRECT: 0.1 mg/dL (ref <=0.2)
BILIRUBIN INDIRECT: 0.4 mg/dL (ref 0.2–1.2)
BILIRUBIN TOTAL: 0.5 mg/dL (ref 0.2–1.2)
Total Protein: 6.1 g/dL (ref 6.1–8.1)

## 2016-09-30 LAB — LIPID PANEL
CHOL/HDL RATIO: 5.7 ratio — AB (ref <5.0)
CHOLESTEROL: 239 mg/dL — AB (ref <200)
HDL: 42 mg/dL — ABNORMAL LOW (ref >50)
LDL Cholesterol: 170 mg/dL — ABNORMAL HIGH (ref <100)
Triglycerides: 134 mg/dL (ref <150)
VLDL: 27 mg/dL (ref <30)

## 2016-10-02 ENCOUNTER — Telehealth: Payer: Self-pay

## 2016-10-02 DIAGNOSIS — E7800 Pure hypercholesterolemia, unspecified: Secondary | ICD-10-CM

## 2016-10-02 NOTE — Telephone Encounter (Signed)
Daughter who is caretaker, was notified and will call lipid clinic.She is going out of town this week.She has lipid clinics number

## 2016-10-02 NOTE — Telephone Encounter (Signed)
-----   Message from Jodelle GrossKathryn M Lawrence, NP sent at 10/02/2016  6:57 AM EDT ----- Labs reviewed. Please refer to lipid clinic for uncontrolled hypercholesterolemia with known CAD, intolerant to statins. Thank you!@

## 2016-10-02 NOTE — Telephone Encounter (Signed)
-----   Message from Kathryn M Lawrence, NP sent at 10/02/2016  6:57 AM EDT ----- Labs reviewed. Please refer to lipid clinic for uncontrolled hypercholesterolemia with known CAD, intolerant to statins. Thank you!@ 

## 2016-10-02 NOTE — Telephone Encounter (Signed)
Spoke with patient's granddaughter and scheduled lipid clinic appt on 7/25 at pt's earliest convenience.

## 2016-10-02 NOTE — Telephone Encounter (Signed)
Ref placed to lipid clinic, [facility] lmtcb with pt-cc

## 2016-10-04 ENCOUNTER — Telehealth: Payer: Self-pay | Admitting: Adult Health

## 2016-10-04 NOTE — Telephone Encounter (Signed)
Patient has concerns about BP readings/ tg  °

## 2016-10-04 NOTE — Telephone Encounter (Signed)
Pt returned call. She complains that for the past two days she has had a terrible headache and she checked her blood pressure a few times and it is running high. (148/101, 156/108, 150/104)  She would like to know if she can go back to taking her lisinopril. I did tell pt that we will be leaving at 5:oo, and that I may not be able get back with her today, but if she felt worse than she could go to the ED to be evaluated. She voiced understanding.  Please advise.

## 2016-10-04 NOTE — Telephone Encounter (Signed)
Returned pt call. No answer, left message for pt to return call.  

## 2016-10-05 NOTE — Telephone Encounter (Signed)
LMTCB-c

## 2016-10-05 NOTE — Telephone Encounter (Signed)
We took her off of lisinopril because she complained that he caused her to have tingling in her throat and lips when she took it. Did this improve when she stopped the lisinopril? If it did not, it may not be related to this medication. If her symptoms did improve and she did not feel the tingling any longer, she will need to stay off of the lisinopril. Please check on this before we restart medications or change dose of amlodipine.

## 2016-10-06 NOTE — Telephone Encounter (Signed)
Called pt, no answer, left message for pt to return call.

## 2016-10-09 NOTE — Addendum Note (Signed)
Addendum  created 10/09/16 1725 by Mikeya Tomasetti, MD   Sign clinical note    

## 2016-10-11 NOTE — Telephone Encounter (Signed)
Called pt, left message for pt to return call.  

## 2016-10-18 ENCOUNTER — Ambulatory Visit (INDEPENDENT_AMBULATORY_CARE_PROVIDER_SITE_OTHER): Payer: Medicare HMO | Admitting: Pharmacist

## 2016-10-18 DIAGNOSIS — E784 Other hyperlipidemia: Secondary | ICD-10-CM | POA: Diagnosis not present

## 2016-10-18 DIAGNOSIS — E7849 Other hyperlipidemia: Secondary | ICD-10-CM

## 2016-10-18 NOTE — Patient Instructions (Addendum)
We will contact Dr. Sherwood GamblerFusco for statin and cholesterol history. Once we obtain statin history we will contact you to decide treatment options based on discussion today.   Potentially will pursue Repatha (injection).  Cholesterol Cholesterol is a fat. Your body needs a small amount of cholesterol. Cholesterol (plaque) may build up in your blood vessels (arteries). That makes you more likely to have a heart attack or stroke. You cannot feel your cholesterol level. Having a blood test is the only way to find out if your level is high. Keep your test results. Work with your doctor to keep your cholesterol at a good level. What do the results mean?  Total cholesterol is how much cholesterol is in your blood.  LDL is bad cholesterol. This is the type that can build up. Try to have low LDL.  HDL is good cholesterol. It cleans your blood vessels and carries LDL away. Try to have high HDL.  Triglycerides are fat that the body can store or burn for energy. What are good levels of cholesterol?  Total cholesterol below 200.  LDL below 100 is good for people who have health risks. LDL below 70 is good for people who have very high risks.  HDL above 40 is good. It is best to have HDL of 60 or higher.  Triglycerides below 150. How can I lower my cholesterol? Diet Follow your diet program as told by your doctor.  Choose fish, white meat chicken, or Malawiturkey that is roasted or baked. Try not to eat red meat, fried foods, sausage, or lunch meats.  Eat lots of fresh fruits and vegetables.  Choose whole grains, beans, pasta, potatoes, and cereals.  Choose olive oil, corn oil, or canola oil. Only use small amounts.  Try not to eat butter, mayonnaise, shortening, or palm kernel oils.  Try not to eat foods with trans fats.  Choose low-fat or nonfat dairy foods. ? Drink skim or nonfat milk. ? Eat low-fat or nonfat yogurt and cheeses. ? Try not to drink whole milk or cream. ? Try not to eat ice  cream, egg yolks, or full-fat cheeses.  Healthy desserts include angel food cake, ginger snaps, animal crackers, hard candy, popsicles, and low-fat or nonfat frozen yogurt. Try not to eat pastries, cakes, pies, and cookies.  Exercise Follow your exercise program as told by your doctor.  Be more active. Try gardening, walking, and taking the stairs.  Ask your doctor about ways that you can be more active.  Medicine  Take over-the-counter and prescription medicines only as told by your doctor.  This information is not intended to replace advice given to you by your health care provider. Make sure you discuss any questions you have with your health care provider. Document Released: 06/09/2008 Document Revised: 10/13/2015 Document Reviewed: 09/23/2015 Elsevier Interactive Patient Education  2017 [employer]

## 2016-10-18 NOTE — Progress Notes (Signed)
Patient ID: Sheryl ChanceShirley M Sweda                 DOB: 06/05/1940                    MRN: [id number]     HPI: Sheryl Carter is a 76 y.o. female patient of Joni ReiningKathryn Lawrence, NP that presents today for lipid evaluation.  PMH includes coronary artery disease, status post coronary artery bypass grafting 4 (LIMA to LAD, SVG harvest of the bilateral thighs, SVG to diagonal 2, SVG to OM, SVG to RCA). CABG was performed in the setting of severe three-vessel coronary artery disease and distal left main disease.  Stomach issues on one of them.   She presents today with her daughter who helps her manage her medications. She is unsure of statin history.   Risk Factors: TIA, NSTEMI, CAD LDL Goal: <70  Current Medications: Fish oil 1000mg  daily Intolerances: pravastatin 20mg  daily - leg became heavy and she was unable to lift them. This improved after discontinuation of therapy.   Diet: Most meals prepared from home. Breakfast oatmeal with fruit and eggwhites, occasional toast with peanut butter. Lunch and dinner generally with chicken (baked) or tuna and Malawiturkey with tomatoes. Eats mixed greens with baked potatoes. Yogurt. Hamburger occasionally.   Exercise: Walk at home. No formal exercise. Chair exercises 2-3 times per week.   Family History: Heart disease in her sister and son.   Social History: The patient  reports that she has never smoked. She has never used smokeless tobacco. She reports that she does not drink alcohol or use drugs.   Labs: 09/29/16: TC 239, TG 134, HDL 42, LDL 170 - Fish oil 1000mg  daily   Past Medical History:  Diagnosis Date  . Anxiety   . Blindness of left eye   . Complication of anesthesia    has gas trapped in her body after  . DVT (deep venous thrombosis) (HCC)   . Ear problems   . Family history of adverse reaction to anesthesia    daughter has gas trapped in her body postop as well  . Glaucoma   . Hypercholesterolemia   . Hypertension   . Pre-diabetes    [facility] that she is prediabetic, HgbA1c was 5.4  . Stroke [facility](HCC)    having TIA's    Current Outpatient Prescriptions on File Prior to Visit  Medication Sig Dispense Refill  . ALPRAZolam (XANAX) 0.25 MG tablet Take 0.5 tablets (0.125 mg total) by mouth 3 (three) times daily as needed for anxiety. (Patient taking differently: Take 0.125 mg by mouth 2 (two) times daily. One half in the morning One half at night) 30 tablet 0  . amLODipine (NORVASC) 2.5 MG tablet Take 1 tablet (2.5 mg total) by mouth daily. 90 tablet 3  . aspirin EC 325 MG EC tablet Take 1 tablet (325 mg total) by mouth daily. 30 tablet 0  . esomeprazole (NEXIUM 24HR) 20 MG capsule Take 1 capsule (20 mg total) by mouth at bedtime. 30 capsule 3  . fexofenadine (ALLEGRA) 180 MG tablet Take 180 mg by mouth daily.    . furosemide (LASIX) 20 MG tablet Take 1 tablet (20 mg total) by mouth daily. 90 tablet 3  . hydrocortisone (ANUSOL-HC) 25 MG suppository Place 25 mg rectally 2 (two) times daily.    . metoprolol tartrate (LOPRESSOR) 25 MG tablet TAKE ONE TABLET BY MOUTH 2 TIMES A DAY. 180 tablet 3  .  Omega-3 Fatty Acids (FISH OIL) 1000 MG CAPS Take 1,000 mg by mouth daily.    . polyethylene glycol (MIRALAX) packet Take 17 g by mouth daily. 14 each 0  . potassium chloride SA (K-DUR,KLOR-CON) 20 MEQ tablet TAKE ONE TABLET BY MOUTH DAILY. 90 tablet 3  . timolol (BETIMOL) 0.5 % ophthalmic solution Place 1 drop into the right eye 2 (two) times daily.     . vitamin B-12 (CYANOCOBALAMIN) 1000 MCG tablet Take 1,000 mcg by mouth at bedtime.      No current facility-administered medications on file prior to visit.     Allergies  Allergen Reactions  . Other Hives    Mycins-   Pt has Glaucoma in Right eye, Blind in Left eye .... PLEASE DO NOT GIVE ANYTHING TO PATIENT THAT MIGHT DESTROY VISION   . Prednisone Other (See Comments)    Altered mental status  . Statins Other (See Comments)    MYALGIAS Weakness, muscle aches, and  pain  . Erythromycin     UNSPECIFIED REACTION   . Tylenol [Acetaminophen] Other (See Comments)    Hallucinations    Assessment/Plan: Hyperlipidemia: LDL not at goal. Based on records from Dr. Sharyon MedicusFusco's office patient has tried:  Zocor, Pravastatin 20mg  daily (after 5 attempts at getting records and >60pages of medical records reviewed).   Since per patient record it appears she has only tried pravastatin 20mg  and unknown dose of simvastatin will pursue Crestor 5mg  daily first. If unable to tolerate will consider alternate therapies. Spoke with patient and advised. She states appreciation and understanding. She requests daughter be contacted as well. This has been done.    Thank you,  Freddie ApleyKelley M. Cleatis PolkaAuten, PharmD  [facility]  10/18/2016 7:22 AM

## 2016-10-20 ENCOUNTER — Other Ambulatory Visit: Payer: Self-pay | Admitting: *Deleted

## 2016-10-20 NOTE — Patient Outreach (Signed)
[employer] High Risk Screen. I spoke with pt's daughter today who is one of her HCPOAs [place] Meo(Geneva Robertson). Pt has had two major surgeries this year: carotid endarterectomy and CABG with subsequent 20 day SNF stay. She subsequently went to her daughters home to recover but has now returned to her own home to live independently. Her daughter and son have daily contact or face to face visits with her. Pt is not driving currently, goes to store with her daughter. NOTE she has glaucoma and is blind in one eye. She also has Moderate to Severe Anxiety and can be upset easily, so a gentle approach is required. Mrs. Southerd is following a low fat, heart healthy diet and she walks and does exercise with a [employer] program daily. Her daughter reports she is a DNR and would only want supportive noninvasive measures and comfort care.  I shared with Ms. Merilynn FinlandRobertson what are services include and she is very hopeful that her mother will consent. She asks that I call her on Monday after she can discuss with her our services. I encouraged her to encourage participation she would be a great candidate for Community Care Management.  I will call Mrs. Degroat on Monday as requested.  Zara Councilarroll [name], MSN, [facility]NP-BC Gerontological Nurse Practitioner [facility] Care Management [phone]

## 2016-10-23 ENCOUNTER — Encounter: Payer: Self-pay | Admitting: *Deleted

## 2016-10-23 ENCOUNTER — Other Ambulatory Visit: Payer: Self-pay | Admitting: *Deleted

## 2016-10-23 NOTE — Patient Outreach (Signed)
[employer] High Risk Patient. Sheryl Carter and I talked this morning and she has given her consent to have  [facility]. 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 Focus on management of CAD and pt safety (because of her low vision).  Sheryl Councilarroll [name], MSN, [facility] Hospital[facility]NP-BC Gerontological Nurse Practitioner [facility] Care Management [phone]

## 2016-10-23 NOTE — Patient Outreach (Signed)
[facility]) Care Management  10/23/2016  Sheryl Carter 06/16/1940 [id number]  [facility] Sheryl Carter and I spoke today and she is very interested in care management services to help with chronic disease management and issues around low vision.   Plan: I will see Sheryl Carter for an in person/home visit on this Thursday @ 1pm.    Sheryl Carter [facility],BSN,RN,CCM [facility] Care Management  [phone]

## 2016-10-26 ENCOUNTER — Other Ambulatory Visit: Payer: Self-pay | Admitting: *Deleted

## 2016-10-26 NOTE — Patient Outreach (Signed)
[facility]) Care Management   10/26/2016  Sheryl Carter 05/14/1940 [id number]  Sheryl Carter is an 76 y.o. 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 I was asked to see Mrs. Sheryl Carter to identify further needs related to her low vision. She is blind in her left eye and has glaucoma of the right eye. I visited Mrs. Sheryl Carter at home today. The level of organization by her Sheryl Carter of medications, records, and planning as related to Mrs. Sheryl Carter's health is impressive. Mrs. Sheryl Carter had only one question about her medication (see below) and stated that she has had low vision for so long that she's "used to it....its just how I live".   Subjective: "I just really need to know whether I'm going to have to take a shot or take some other kind of medicine for my cholesterol."  Objective:  BP 110/60   Pulse (!) 54   Wt 159 lb (72.1 kg)   SpO2 97%   BMI 36.96 kg/m   Review of Systems  Constitutional: Negative.   HENT: Negative.   Eyes: Positive for blurred vision.  Respiratory: Negative.   Cardiovascular: Negative for chest pain, palpitations, orthopnea and leg swelling.  Gastrointestinal: Negative.   Genitourinary: Negative.   Musculoskeletal: Negative.  Negative for falls.  Skin: Negative.   Neurological: Negative.   Psychiatric/Behavioral: Negative.     Physical Exam  Constitutional: She is oriented to person, place, and time. She appears well-developed and well-nourished. She is active. She does not have a sickly appearance. She does not appear ill.  Cardiovascular: Regular rhythm.  Exam reveals no friction rub.   No murmur heard. Respiratory: Effort normal and breath sounds normal. She has no wheezes. She has no rhonchi. She has no rales.  GI: Soft. Bowel sounds are normal. There is no tenderness.  Neurological: She is alert  and oriented to person, place, and time.  Skin: Skin is warm, dry and intact.  Psychiatric: She has a normal mood and affect. Her speech is normal and behavior is normal. Judgment and thought content normal. Cognition and memory are normal.    Encounter Medications:   Outpatient Encounter Prescriptions as of 10/26/2016  Medication Sig Note  . ALPRAZolam (XANAX) 0.25 MG tablet Take 0.5 tablets (0.125 mg total) by mouth 3 (three) times daily as needed for anxiety. (Patient taking differently: Take 0.125 mg by mouth 2 (two) times daily. One half in the morning One half at night)   . amLODipine (NORVASC) 2.5 MG tablet Take 1 tablet (2.5 mg total) by mouth daily.   Marland Kitchen. aspirin EC 325 MG EC tablet Take 1 tablet (325 mg total) by mouth daily.   Marland Kitchen. esomeprazole (NEXIUM 24HR) 20 MG capsule Take 1 capsule (20 mg total) by mouth at bedtime.   . fexofenadine (ALLEGRA) 180 MG tablet Take 180 mg by mouth daily. 10/26/2016: Takes only if needed  . furosemide (LASIX) 20 MG tablet Take 1 tablet (20 mg total) by mouth daily.   . metoprolol tartrate (LOPRESSOR) 25 MG tablet TAKE ONE TABLET BY MOUTH 2 TIMES A DAY.   Marland Kitchen. Omega-3 Fatty Acids (FISH OIL) 1000 MG CAPS Take 1,000 mg by mouth daily.   . polyethylene glycol (MIRALAX) packet Take 17 g by mouth daily. 10/26/2016: Takes every other day  . potassium chloride SA (K-DUR,KLOR-CON) 20 MEQ tablet TAKE ONE TABLET BY MOUTH DAILY.   .Marland Kitchen  timolol (BETIMOL) 0.5 % ophthalmic solution Place 1 drop into the right eye 2 (two) times daily.    . vitamin B-12 (CYANOCOBALAMIN) 1000 MCG tablet Take 1,000 mcg by mouth at bedtime.     No facility-administered encounter medications on file as of 10/26/2016.     Functional Status:   In your present state of health, do you have any difficulty performing the following activities: 10/26/2016 06/05/2016  Hearing? N N  Vision? Y Y  Comment - leye blindness  Difficulty concentrating or making decisions? Y N  Walking or climbing stairs? Y Y   Dressing or bathing? N N  Doing errands, shopping? Y N  Preparing Food and eating ? N -  Using the Toilet? N -  In the past six months, have you accidently leaked urine? N -  Do you have problems with loss of bowel control? N -  Managing your Medications? Y -  Managing your Finances? Y -  Housekeeping or managing your Housekeeping? Y -  Some recent data might be hidden    Fall/Depression Screening:    Fall Risk  10/26/2016 08/10/2016  Falls in the past year? No No  Risk for fall due to : Impaired vision -   PHQ 2/9 Scores 10/26/2016  PHQ - 2 Score 0    Assessment:  76 year old female patient who has had quite a difficult year with regard to cardiovascular health. Early in the year, she underwent carotid endarderectomy and subsequently had coronary artery bypass grafting after an MI. She had significant amnesia after surgery and still lacks the ability to recall some details around the first few months of the year and about her illness. However, she can tell me the dates and times of appointments coming up in October and November.   Medication Management: We reviewed Mrs. Sheryl Carter's medications and her Sheryl Carter has 2 pill boxes filled correctly with Mrs. Sheryl Carter's prescribed medications. Mrs. Sheryl Carter has good knowledge of her medications and can tell me what medications were recently discontinued.   Her only question is about plans for pharmacologic treatment of hyperlipidemia. I have reached out to Joni Reining DNP to inquire and request follow up.   Low Vision:  Mrs. Sheryl Carter is blind in her left eye and has low vision secondary to glaucoma in her right eye. She rigorously follows the appointment schedule given her by her ophthalmologist, Dr. Nile Riggs. She explains that the plan of care for her low vision is close monitoring and eye drops.   Home Safety/Care Needs:  Mrs. Sheryl Carter is exceptionally well attended by her family. Her son had her mobile home moved to a lot closer to him  and his work. All her transportation needs are attended to by her children. Her Sheryl Carter manages her medications and goes with her to all appointments and keeps all her medical documents well organized in a calendar.   Plan: I will await recommendation from Joni Reining DNP re: lipid medications. I will reach out to Mrs. Diantonio's Sheryl Carter at her request to inform her of details of my visit today. I will plan to follow up with Mrs. Defenbaugh in 2 weeks by phone to ensure that no other problems have arisen and that she is clear about the plan of care about lipid management.   [facility] CM Care Plan Problem One     Most Recent Value  Care Plan Problem One  Medication Management Concerns  Role Documenting the Problem One  Care Management Coordinator  Care Plan for Problem  One  Active  THN Long Term Goal   Over the next 31 days, patient/Sheryl Carter/caregiver will verbalize understanding of medication regimen as prescribed  [facility] Long Term Goal Start Date  10/26/16  Interventions for Problem One Long Term Goal  Medications reviewed in detail with patient,  inquired with cardiology provider re: lipid lowering medication  [facility] CM Short Term Goal #1   Over the next 72 hours, patient/Sheryl Carter will verbalize understanding of medication regimen as related to lipid management  [facility] CM Short Term Goal #1 Start Date  10/26/16  Interventions for Short Term Goal #1  notified cardiology provider of patient question re: lipid medications,  asked for return call to Sheryl Carter      Marja Kayslisa Nithila Sumners [facility] HospitalMHA,BSN,RN,CCM [facility] Care Management  [phone]

## 2016-10-27 ENCOUNTER — Other Ambulatory Visit: Payer: Self-pay | Admitting: *Deleted

## 2016-10-27 NOTE — Addendum Note (Signed)
Addended by: Nunzio CoryGILBOY, Bruce Mayers G on: 10/27/2016 11:50 AM   Modules accepted: Orders

## 2016-10-27 NOTE — Patient Outreach (Signed)
[facility]) Care Management  10/27/2016  Sheryl Carter 11/10/1940 [id number]  At the request of [name], I reached out to her HCPOA/daughter [name] @ [phone] to follow up on my visit with [name] yesterday. In addition, I advised [name] that I have spoken with Joni ReiningKathryn Lawrence DNP [facility] re: the treatment plan for management of [name] hypercholesterolemia. Dr. Lyman BishopLawrence wishes to start [name] on Repatha but [name] indicates that this medication is cost prohibitive. Dr. Lyman BishopLawrence has asked for our pharmacy team to consult to see if assistance is available to [name]. [name] is in agreement and as per [name], will address any questions and assist with completion of any forms to help [name] with this need.   Plan: [facility] Pharm[facility] team consulted.    Marja Kayslisa Ryder Chesmore MHA,BSN,RN,CCM [facility] Care Management  [phone]

## 2016-11-02 ENCOUNTER — Telehealth: Payer: Self-pay | Admitting: Pharmacist

## 2016-11-02 NOTE — Telephone Encounter (Signed)
Records faxed over from PCP finally.

## 2016-11-02 NOTE — Telephone Encounter (Signed)
New message      Calling to follow up on lipid medication study.  Pt still has not heard anything regarding medication for her cholesterol.  Please call and give an update

## 2016-11-03 MED ORDER — ROSUVASTATIN CALCIUM 5 MG PO TABS: 5.0000 mg | ORAL_TABLET | Freq: Every day | ORAL | 1 refills | Status: DC

## 2016-11-03 NOTE — Telephone Encounter (Signed)
See OV encounter from 10/18/16 with details.

## 2016-11-06 ENCOUNTER — Telehealth: Payer: Self-pay | Admitting: Adult Health

## 2016-11-06 ENCOUNTER — Other Ambulatory Visit: Payer: Self-pay | Admitting: *Deleted

## 2016-11-06 NOTE — Telephone Encounter (Signed)
Patient's daughter states that patient's dosage of lasix was changed and patient is starting to experience swelling/tg

## 2016-11-06 NOTE — Telephone Encounter (Signed)
Pt's daughter made aware. She voiced understanding. She states that she will try to talk her mother into trying the cardaic rehab. She will let us know if she will agree to.

## 2016-11-06 NOTE — Patient Outreach (Signed)
[facility]) Care Management  11/06/2016  Sheryl Carter 05/11/1940 [id number]  I was scheduled to reach out to [name] today to follow up on her general self health management and on medication assistance/management needs related to management of hypercholesterolemia (Repatha eligibility).   I noted that [name]'s daughter called the cardiology office this morning to report some increased edema of the ankles but no weight gain. As [name]'s Lasix was decreased from 40mg  to 20mg  in July for concerns related to volume management, she was not advised to rehydrate but to manage sodium intake. Additionally, Joni ReiningKathryn Lawrence DNP advised that [name] may take Lasix 40mg  ONCE if her edema remains and becomes concerning to her.   Rather than call [name] and her daughter and restate the information provided to her only minutes ago, I will schedule another call for later this week to reinforce the advice given today. I will also reach out to our pharmacy team to inquire re: Repatha patient assistance outreach.   Plan: Outreach call at the end of this week.    Marja Kayslisa Jilliann Subramanian MHA,BSN,RN,CCM [facility] Care Management  [phone]

## 2016-11-06 NOTE — Telephone Encounter (Signed)
Returned pt's daughter call, she was concerned about her mother retaining a little fluid in her left ankle.She has not had any weight gain.  I looked back on her July 12th visit and saw the decrease of lasix from 40 gm to 20 mg. I also saw where you advised them that she may see a little puffiness due to the amlodipine. Daughter also stated that her mother has increased her salt intake recently. Please advise.

## 2016-11-06 NOTE — Addendum Note (Signed)
Addended by: Nunzio CoryGILBOY, ALISA G on: 11/06/2016 12:02 PM   Modules accepted: Orders

## 2016-11-06 NOTE — Telephone Encounter (Signed)
I have reviewed her chart and labs. Correct, amlodipine can cause some dependent edema. She should reduce her salt intake. She has refused cardiac rehab in the past, which would be beneficial for her concerning diet.  If edema becomes worrisome to her, she can take a total of 40 mg of lasix ONCE, and return to 20 mg daily. Do not want to dehydrate her.  Only take the extra dose RARELY.

## 2016-11-21 DIAGNOSIS — Z0001 Encounter for general adult medical examination with abnormal findings: Secondary | ICD-10-CM | POA: Diagnosis not present

## 2016-11-21 DIAGNOSIS — R0789 Other chest pain: Secondary | ICD-10-CM | POA: Diagnosis not present

## 2016-11-21 DIAGNOSIS — F419 Anxiety disorder, unspecified: Secondary | ICD-10-CM | POA: Diagnosis not present

## 2016-11-21 DIAGNOSIS — D692 Other nonthrombocytopenic purpura: Secondary | ICD-10-CM | POA: Diagnosis not present

## 2016-11-21 DIAGNOSIS — Z6836 Body mass index (BMI) 36.0-36.9, adult: Secondary | ICD-10-CM | POA: Diagnosis not present

## 2016-11-21 DIAGNOSIS — I10 Essential (primary) hypertension: Secondary | ICD-10-CM | POA: Diagnosis not present

## 2016-11-22 ENCOUNTER — Telehealth: Payer: Self-pay

## 2016-11-22 NOTE — Telephone Encounter (Signed)
Called pt. To see if she had thought anymore about cardiac rahab. No answer at this time. I will call again soon.

## 2016-11-24 ENCOUNTER — Other Ambulatory Visit: Payer: Self-pay | Admitting: Pharmacist

## 2016-11-24 NOTE — Patient Outreach (Signed)
[facility]) Care Management  11/24/2016  Sheryl Carter 08/18/1940 [id number]  [facility] by [facility] RN Serina CowperAlisa for assistance with coverage of Repatha.    Chart review indicated patient may be seeing cardiology pharmacist regarding lipid management.  Spoke with lipid pharmacist who reported patient was initiated on rosuvastatin 5 mg daily in July 2018.    Per review of [employer] MA-PDP preferred drug list, appears both Repatha (evolocumab) and Praluent (alirocumab) require a prior authorization as part of the coverage process and medication would then be covered at Tier 5 with 29% co-insurance.    Appears per [employer] safety net foundation (Repatha) prior authorization process may need to be completed before patient can apply for manufacturer assistance.   Plan:  Will route this note to Sheryl PhoK Lawrence, DNP with cardiology, whom [facility] reports sent request for pharmacy assistance to see what provider would like to pursue in regards to PA.   Tommye StandardKevin Latiya Navia, PharmD, [facility]BCACP Clinical Pharmacist [facility] [phone]

## 2016-11-28 ENCOUNTER — Other Ambulatory Visit: Payer: Self-pay | Admitting: Cardiology

## 2016-11-28 MED ORDER — ROSUVASTATIN CALCIUM 5 MG PO TABS: 5.0000 mg | ORAL_TABLET | Freq: Every day | ORAL | 6 refills | Status: DC

## 2016-11-28 NOTE — Telephone Encounter (Signed)
rx refilled per request 

## 2016-11-28 NOTE — Telephone Encounter (Signed)
°*  STAT* If patient is at the pharmacy, call can be transferred to refill team.   1. Which medications need to be refilled? (please list name of each medication and dose if known) Rosuvastatin 5mg   2. Which pharmacy/location (including street and city if local pharmacy) is medication to be sent to? [facility]  3. Do they need a 30 day or 90 day supply? 30 day

## 2016-12-06 ENCOUNTER — Telehealth: Payer: Self-pay | Admitting: Pharmacist Clinician (PhC)/ Clinical Pharmacy Specialist

## 2016-12-06 DIAGNOSIS — E785 Hyperlipidemia, unspecified: Secondary | ICD-10-CM

## 2016-12-06 NOTE — Telephone Encounter (Signed)
Spoke with patient.  She is tolerating rosuvastatin 5 mg daily without any concerns.  Will mail orders to have labs drawn next week in [place].  Based on those results will determine if dose increase is needed.  Patient also would like to d/c fish oil, but I asked that she continue until we get labs drawn and then we can determine need for that as well.  Patient voiced understanding.

## 2016-12-07 ENCOUNTER — Other Ambulatory Visit: Payer: Self-pay | Admitting: *Deleted

## 2016-12-07 NOTE — Patient Outreach (Signed)
[facility]) Care Management  12/07/2016  Loleta ChanceShirley M Denz 01/23/1941 [id number]  Loleta ChanceShirley M Maguire is an 76 y.o. 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 I was asked to see Mrs. Cheever to identify further needs related to her low vision. She is blind in her left eye and has glaucoma of the right eye. I visited Mrs. Lipke at home a few months ago and the level of organization by her daughter of medications, records, and planning as related to Mrs. Scullion's health was impressive. I did not see that further intervention with regard to low vision was needed.   Mrs. Kleman and her daughter did wonder about Repatha as an option for treatment of hypercholesterolemia. I discussed this with Joni ReiningKathryn Lawrence DNP and the pharmacist on the team at the office and our pharmacist engaged to see if patient assistance would be an option. In the interim, Mrs. Adria DevonSouthard was continued on low dose Crestor which Mrs. Mette says she has been taking but is "wary about it". She says she wishes to continue her current therapy at this time.   In August, Mrs. Dadisman's daughter reported some increased edema of the ankles but no weight gain. Joni ReiningKathryn Lawrence DNP advised that Mrs. Shepperson may take Lasix 40mg  ONCE if her edema remains and becomes concerning to her. Today, Mrs. Adria DevonSouthard says her legs are not swollen and her weight has been stable.   I offered continued services to Mrs. Blackerby but she indicated that she felt she was doing fairly well and her family was watching over her closely. She is attending all scheduled provider appointments and is adherent to the provider plan of care.   Plan: Mrs. Anello is in agreement at this time that we will close her case with the knowledge that she can reach out to me at any time should new needs arise.    Marja Kayslisa  Yoana Staib MHA,BSN,RN,CCM [facility] Care Management  [phone]

## 2016-12-12 NOTE — Progress Notes (Signed)
This encounter was created in error - please disregard.

## 2016-12-26 ENCOUNTER — Ambulatory Visit (INDEPENDENT_AMBULATORY_CARE_PROVIDER_SITE_OTHER): Payer: Medicare HMO | Admitting: Family

## 2016-12-26 ENCOUNTER — Ambulatory Visit (HOSPITAL_COMMUNITY)
Admission: RE | Admit: 2016-12-26 | Discharge: 2016-12-26 | Disposition: A | Discharge status: Home / Self Care | Payer: Medicare HMO | Source: Ambulatory Visit | Attending: Family | Admitting: Family

## 2016-12-26 ENCOUNTER — Encounter: Payer: Self-pay | Admitting: Family

## 2016-12-26 VITALS — BP 124/66 | HR 47 | Temp 97.1°F | Resp 20 | Ht <= 58 in | Wt 154.0 lb

## 2016-12-26 DIAGNOSIS — Z9889 Other specified postprocedural states: Secondary | ICD-10-CM

## 2016-12-26 DIAGNOSIS — I6523 Occlusion and stenosis of bilateral carotid arteries: Secondary | ICD-10-CM

## 2016-12-26 DIAGNOSIS — I6521 Occlusion and stenosis of right carotid artery: Secondary | ICD-10-CM | POA: Diagnosis not present

## 2016-12-26 LAB — VAS US CAROTID
LCCADDIAS: -15 cm/s
LCCADSYS: -80 cm/s
LCCAPDIAS: 16 cm/s
LEFT ECA DIAS: -3 cm/s
LEFT VERTEBRAL DIAS: -18 cm/s
LICADDIAS: -26 cm/s
LICADSYS: -99 cm/s
LICAPDIAS: -13 cm/s
LICAPSYS: -55 cm/s
Left CCA prox sys: 77 cm/s
RCCAPSYS: -82 cm/s
RIGHT CCA MID DIAS: 15 cm/s
RIGHT ECA DIAS: -5 cm/s
RIGHT VERTEBRAL DIAS: -4 cm/s
Right CCA prox dias: -10 cm/s
Right cca dist sys: -73 cm/s

## 2016-12-26 NOTE — Progress Notes (Signed)
Chief Complaint: Follow up Extracranial Carotid Artery Stenosis   History of Present Illness  Sheryl Carter is a 76 y.o. female who is s/p left carotid endarterectomy on 05/29/16 by Dr. Arbie Cookey.  She did well and had an uneventful hospital course.  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  While in the hospital, she did have a carotid duplex that revealed 1-39% stenosis of the right internal carotid artery and left internal carotid artery.  Vertebral arteries were antegrade flow.  On 06/09/16, she underwent CABG x 4 by Dr. Tyrone Sage.    She was discharged on 06/15/16 to a rehab facility, was discharged after about 30 days, then lived with her daughter for a couple of months. She has been doing well at home.   She was evaluated by Dr. 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 Pt denies any sx's of stroke in the past prior to this year's events.  Pt denies any known subsequent stroke or TIA.   She denies any claudication sx's with walking. She states she gets tired, but feels she is improving slowly over time.   She states she has hallucinations if she takes Tylenol.   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   She was last evaluated by S. Rhyne, PA-C, and Dr. Arbie Cookey on 06-20-16. At that time pt was neurologically in tact. Her left neck incision is healing nicely. She had some complaints of numbness in the LLE.  This is the leg the vein was harvested from for bypass, which could explain the numbness. She has an easily palpable left AT pulse.  She had some soreness in the left chest.  She is s/p median sternotomy and the LIMA was also harvested.  This should improve.  She is on a daily aspirin.  She is not on a statin as she is allergic.  She was to f/u in 6 months with carotid duplex.  Sx of stroke were reviewed and she will contact us or call 911 if she  has any of these sx.   Readmitted several days after elective carotid surgery with unstable coronary disease requiring urgent coronary bypass grafting. Discussed this at length with the patient and her daughter. Fortunately did not have a bad outcome with her carotid surgery.  She states she has improved her diet since her CABG, and has lost 50 pounds.    Pt Diabetic: no, 5.8 A1C in January 2018 (review of records) Pt smoker: non-smoker  Pt meds include: Statin : yes ASA: yes Other anticoagulants/antiplatelets: no   Past Medical History:  Diagnosis Date  . Anxiety   . Blindness of left eye   . Complication of anesthesia    has gas trapped in her body after  . DVT (deep venous thrombosis) (HCC)   . Ear problems   . Family history of adverse reaction to anesthesia    daughter has gas trapped in her body postop as well  . Glaucoma   . Hypercholesterolemia   . Hypertension   . Pre-diabetes    pt. told by [facility] staff that she is prediabetic, HgbA1c was 5.4  . Stroke [facility])    having TIA's    Social History Social History  Substance Use Topics  . Smoking status: Never Smoker  . Smokeless tobacco: Never Used  . Alcohol use No  Family History Family History  Problem Relation Age of Onset  . Heart disease Sister   . Heart disease Son     Surgical History Past Surgical History:  Procedure Laterality Date  . BREAST SURGERY Left    cyst- L breast  . CHOLECYSTECTOMY    . CORONARY ARTERY BYPASS GRAFT N/A 06/09/2016   Procedure: CORONARY ARTERY BYPASS GRAFTING (CABG) times 4 using the left internal mammary artery and bilateral thigh greater saphenous veins harvested endoscopically. LIMA to LAD, SVG to DIAGONAL 2, SVG to OM, SVG to RCA;  Surgeon: Delight Ovens, MD;  Location: [facility] OR;  Service: Open Heart Surgery;  Laterality: N/A;  . ENDARTERECTOMY Left 05/29/2016   Procedure: LEFT CAROTID ENDARTERECTOMY WITH PATCH ANGIOPLASTY;  Surgeon: Larina Earthly, MD;  Location: [facility] OR;   Service: Vascular;  Laterality: Left;  . EYE SURGERY Right    cataract  . LEFT HEART CATH AND CORONARY ANGIOGRAPHY N/A 06/07/2016   Procedure: Left Heart Cath and Coronary Angiography;  Surgeon: Iran Ouch, MD;  Location: [facility] INVASIVE CV LAB;  Service: Cardiovascular;  Laterality: N/A;  . TEE WITHOUT CARDIOVERSION N/A 06/09/2016   Procedure: TRANSESOPHAGEAL ECHOCARDIOGRAM (TEE);  Surgeon: Delight Ovens, MD;  Location: [facility] OR;  Service: Open Heart Surgery;  Laterality: N/A;    Allergies  Allergen Reactions  . Other Hives    Mycins-   Pt has Glaucoma in Right eye, Blind in Left eye .... PLEASE DO NOT GIVE ANYTHING TO PATIENT THAT MIGHT DESTROY VISION   . Prednisone Other (See Comments)    Altered mental status  . Statins Other (See Comments)    MYALGIAS Weakness, muscle aches, and pain  . Erythromycin     UNSPECIFIED REACTION   . Tylenol [Acetaminophen] Other (See Comments)    Hallucinations    Current Outpatient Prescriptions  Medication Sig Dispense Refill  . ALPRAZolam (XANAX) 0.25 MG tablet Take 0.5 tablets (0.125 mg total) by mouth 3 (three) times daily as needed for anxiety. (Patient taking differently: Take 0.125 mg by mouth 2 (two) times daily. One half in the morning One half at night) 30 tablet 0  . amLODipine (NORVASC) 2.5 MG tablet Take 1 tablet (2.5 mg total) by mouth daily. 90 tablet 3  . aspirin EC 325 MG EC tablet Take 1 tablet (325 mg total) by mouth daily. 30 tablet 0  . esomeprazole (NEXIUM 24HR) 20 MG capsule Take 1 capsule (20 mg total) by mouth at bedtime. 30 capsule 3  . fexofenadine (ALLEGRA) 180 MG tablet Take 180 mg by mouth daily.    . furosemide (LASIX) 20 MG tablet Take 1 tablet (20 mg total) by mouth daily. 90 tablet 3  . metoprolol tartrate (LOPRESSOR) 25 MG tablet TAKE ONE TABLET BY MOUTH 2 TIMES A DAY. 180 tablet 3  . Omega-3 Fatty Acids (FISH OIL) 1000 MG CAPS Take 1,000 mg by mouth daily.    . polyethylene glycol (MIRALAX) packet Take 17 g  by mouth daily. 14 each 0  . potassium chloride SA (K-DUR,KLOR-CON) 20 MEQ tablet TAKE ONE TABLET BY MOUTH DAILY. 90 tablet 3  . rosuvastatin (CRESTOR) 5 MG tablet Take 1 tablet (5 mg total) by mouth daily. 30 tablet 6  . timolol (BETIMOL) 0.5 % ophthalmic solution Place 1 drop into the right eye 2 (two) times daily.     . vitamin B-12 (CYANOCOBALAMIN) 1000 MCG tablet Take 1,000 mcg by mouth at bedtime.      No current facility-administered medications for this  visit.     Review of Systems : See HPI for pertinent positives and negatives.  Physical Examination  Vitals:   12/26/16 1030 12/26/16 1032  BP: (!) 144/72 124/66  Pulse: (!) 47   Resp: 20   Temp: (!) 97.1 F (36.2 C)   TempSrc: Oral   SpO2: 97%   Weight: 154 lb (69.9 kg)   Height: 4' 7.5" (1.41 m)    Body mass index is 35.15 kg/m.  General: WDWN female in NAD GAIT: slow, steady Eyes: PERRLA Pulmonary:  Respirations are non-labored, good air movement, CTAB, no rales, rhonchi, or wheezing.  Cardiac: regular rhythm, no detected murmur.  VASCULAR EXAM Carotid Bruits Right Left   Negative Negative     Abdominal aortic pulse is not palpable. Radial pulses are 1+ palpable and equal.                                                                                                                            LE Pulses Right Left       POPLITEAL  not palpable   not palpable       POSTERIOR TIBIAL  1+ palpable   1+ palpable        DORSALIS PEDIS      ANTERIOR TIBIAL faintly palpable  faintly palpable     Gastrointestinal: soft, nontender, BS WNL, no r/g, no palpable masses.  Musculoskeletal: No muscle atrophy/wasting. M/S 5/5 throughout, extremities without ischemic changes.  Neurologic:  A&O X 3; appropriate affect, sensation is normal; speech is normal, CN 2-12 intact, pain and light touch intact in extremities, motor exam as listed above.    Assessment: PAYTYN MESTA is a 76 y.o. female who is s/p left  carotid endarterectomy on 05/29/16. She has an asymptomatic stroke in the oast that was found on brain imaging early in 2018. Does not seems to have had any subsequent stroke or TIA.  She had a CABG on 06-09-16.   DATA Carotid Duplex (12/26/16): Right ICA: 1-39% stenosis. Left ICA: CEA site with no restenosis. Bilateral vertebral artery flow is antegrade.  Bilateral subclavian artery waveforms are normal.  No change since the last exam on 06-05-16 at [facility].    Plan: Follow-up in 6 months with Carotid Duplex scan.   I discussed in depth with the patient the nature of atherosclerosis, and emphasized the importance of maximal medical management including strict control of blood pressure, blood glucose, and lipid levels, obtaining regular exercise, and continued cessation of smoking.  The patient is aware that without maximal medical management the underlying atherosclerotic disease process will progress, limiting the benefit of any interventions. The patient was given information about stroke prevention and what symptoms should prompt the patient to seek immediate medical care. Thank you for allowing [place] to participate in this patient's care.  Charisse March, RN, MSN, FNP-C [facility] Office: [phone]  Clinic Physician: Early  12/26/16 10:49 AM

## 2016-12-26 NOTE — Patient Instructions (Signed)
Stroke Prevention Some medical conditions and behaviors are associated with an increased chance of having a stroke. You may prevent a stroke by making healthy choices and managing medical conditions. How can I reduce my risk of having a stroke?  Stay physically active. Get at least 30 minutes of activity on most or all days.  Do not smoke. It may also be helpful to avoid exposure to secondhand smoke.  Limit alcohol use. Moderate alcohol use is considered to be:  No more than 2 drinks per day for men.  No more than 1 drink per day for nonpregnant women.  Eat healthy foods. This involves:  Eating 5 or more servings of fruits and vegetables a day.  Making dietary changes that address high blood pressure (hypertension), high cholesterol, diabetes, or obesity.  Manage your cholesterol levels.  Making food choices that are high in fiber and low in saturated fat, trans fat, and cholesterol may control cholesterol levels.  Take any prescribed medicines to control cholesterol as directed by your health care provider.  Manage your diabetes.  Controlling your carbohydrate and sugar intake is recommended to manage diabetes.  Take any prescribed medicines to control diabetes as directed by your health care provider.  Control your hypertension.  Making food choices that are low in salt (sodium), saturated fat, trans fat, and cholesterol is recommended to manage hypertension.  Ask your health care provider if you need treatment to lower your blood pressure. Take any prescribed medicines to control hypertension as directed by your health care provider.  If you are 18-39 years of age, have your blood pressure checked every 3-5 years. If you are 40 years of age or older, have your blood pressure checked every year.  Maintain a healthy weight.  Reducing calorie intake and making food choices that are low in sodium, saturated fat, trans fat, and cholesterol are recommended to manage  weight.  Stop drug abuse.  Avoid taking birth control pills.  Talk to your health care provider about the risks of taking birth control pills if you are over 35 years old, smoke, get migraines, or have ever had a blood clot.  Get evaluated for sleep disorders (sleep apnea).  Talk to your health care provider about getting a sleep evaluation if you snore a lot or have excessive sleepiness.  Take medicines only as directed by your health care provider.  For some people, aspirin or blood thinners (anticoagulants) are helpful in reducing the risk of forming abnormal blood clots that can lead to stroke. If you have the irregular heart rhythm of atrial fibrillation, you should be on a blood thinner unless there is a good reason you cannot take them.  Understand all your medicine instructions.  Make sure that other conditions (such as anemia or atherosclerosis) are addressed. Get help right away if:  You have sudden weakness or numbness of the face, arm, or leg, especially on one side of the body.  Your face or eyelid droops to one side.  You have sudden confusion.  You have trouble speaking (aphasia) or understanding.  You have sudden trouble seeing in one or both eyes.  You have sudden trouble walking.  You have dizziness.  You have a loss of balance or coordination.  You have a sudden, severe headache with no known cause.  You have new chest pain or an irregular heartbeat. Any of these symptoms may represent a serious problem that is an emergency. Do not wait to see if the symptoms will go away.   Get medical help at once. Call your local emergency services (911 in [place] Do not drive yourself to the hospital. This information is not intended to replace advice given to you by your health care provider. Make sure you discuss any questions you have with your health care provider. Document Released: 04/20/2004 Document Revised: 08/19/2015 Document Reviewed: 09/13/2012 Elsevier  Interactive Patient Education  2017 Else[employer] Inc.  

## 2016-12-27 NOTE — Addendum Note (Signed)
Addended by: Burton Apley A on: 12/27/2016 09:47 AM   Modules accepted: Orders

## 2016-12-29 ENCOUNTER — Other Ambulatory Visit: Payer: Self-pay | Admitting: Adult Health

## 2016-12-29 DIAGNOSIS — Z0001 Encounter for general adult medical examination with abnormal findings: Secondary | ICD-10-CM | POA: Diagnosis not present

## 2016-12-29 DIAGNOSIS — E785 Hyperlipidemia, unspecified: Secondary | ICD-10-CM | POA: Diagnosis not present

## 2016-12-30 LAB — LIPID PANEL W/O CHOL/HDL RATIO
Cholesterol, Total: 157 mg/dL (ref 100–199)
HDL: 51 mg/dL (ref >39)
LDL CALC: 87 mg/dL (ref 0–99)
Triglycerides: 97 mg/dL (ref 0–149)
VLDL CHOLESTEROL CAL: 19 mg/dL (ref 5–40)

## 2017-01-05 ENCOUNTER — Other Ambulatory Visit: Payer: Self-pay | Admitting: Pharmacist

## 2017-01-05 NOTE — Patient Outreach (Signed)
[facility]) Care Management  01/05/2017  SHAWNDA MAUNEY 08-22-1940 [id number]  Patient was referred to [facility] CM Pharmacist for assistance with approval/patient assistance of PCSK 9 inhibitor therapy.    She has been followed by [facility] lipid clinic.    Per message received below, prescriber, Dr Harriet Pho, DNP, prefers to let lipid clinic manage patient's lipid regimen.    RE: med question  Received: 5 days ago  Message Contents  Jodelle Gross, NP  Bell Carbo, Maryagnes Amos, [facility]        I would like you all to please manage this in lipid clinic. If pre-authorization is necessary, please proceed, Happy to help in anyway I can with this process.,   Thanks,   Previous Messages    ----- Message -----  From: Darreld Mclean, [facility]  Sent: 12/28/2016 10:54 AM  To: Jodelle Gross, NP  Subject: med question                   Dr Lyman Bishop:   I spoke with [facility] RN Sheryl Carter regarding patient's cholesterol treatment options. It looks like lipid clinic also followed-up with patient last month.   I know you had been interested in pursuing PCSK-9 inhibitor for patient. She will need to go through prior authorization with her insurance and patient assistance application process.    Is this something you want to pursue at this time, or is lipid clinic going to manage her lipid treatment?   Thanks for your time       Plan:  Will close this case as patient is following up with lipid clinic.    Tommye Standard, PharmD, [facility] Clinical Pharmacist [facility] [phone]

## 2017-01-26 ENCOUNTER — Ambulatory Visit (INDEPENDENT_AMBULATORY_CARE_PROVIDER_SITE_OTHER): Payer: Medicare HMO | Admitting: Neurology

## 2017-01-26 ENCOUNTER — Encounter: Payer: Self-pay | Admitting: Neurology

## 2017-01-26 VITALS — BP 128/72 | HR 46 | Ht <= 58 in | Wt 157.5 lb

## 2017-01-26 DIAGNOSIS — R202 Paresthesia of skin: Secondary | ICD-10-CM

## 2017-01-26 NOTE — Progress Notes (Signed)
Reason for visit: Cerebrovascular disease  Sheryl Carter is an 76 y.o. female  History of present illness:  Sheryl Carter is a 76 year old right-handed white female with a history of cerebrovascular disease.  The patient has had some sensory complaints involving the left lower face and left leg previously, he claims that this has improved since last seen.  The patient had a brief episode of left-sided chest pain 3 days ago, she had some slight left lower face numbness with this.  The numbness is not persistent.  The chest pain has not recurred.  The patient does have a baseline balance issue, she uses a cane for ambulation, she has not had any falls.  The left foot numbness has improved.  There have been some concerns about memory, but the patient does not believe that there has been any issues with this and she comes in with a family member who concurs with her.  The patient otherwise has been medically stable.  She returns for an evaluation.  Past Medical History:  Diagnosis Date  . Anxiety   . Blindness of left eye   . Complication of anesthesia    has gas trapped in her body after  . DVT (deep venous thrombosis) (HCC)   . Ear problems   . Family history of adverse reaction to anesthesia    daughter has gas trapped in her body postop as well  . Glaucoma   . Hypercholesterolemia   . Hypertension   . Pre-diabetes    pt. told by [facility] staff that she is prediabetic, HgbA1c was 5.4  . Stroke (HCC)    having TIA's    Past Surgical History:  Procedure Laterality Date  . BREAST SURGERY Left    cyst- L breast  . CHOLECYSTECTOMY    . CORONARY ARTERY BYPASS GRAFT N/A 06/09/2016   Procedure: CORONARY ARTERY BYPASS GRAFTING (CABG) times 4 using the left internal mammary artery and bilateral thigh greater saphenous veins harvested endoscopically. LIMA to LAD, SVG to DIAGONAL 2, SVG to OM, SVG to RCA;  Surgeon: Delight Ovens, MD;  [facility];  Service: Open Heart Surgery;   Laterality: N/A;  . ENDARTERECTOMY Left 05/29/2016   Procedure: LEFT CAROTID ENDARTERECTOMY WITH PATCH ANGIOPLASTY;  Surgeon: Larina Earthly, MD;  Location: [facility] OR;  Service: Vascular;  Laterality: Left;  . EYE SURGERY Right    cataract  . LEFT HEART CATH AND CORONARY ANGIOGRAPHY N/A 06/07/2016   Procedure: Left Heart Cath and Coronary Angiography;  Surgeon: Iran Ouch, MD;  Location: [facility] INVASIVE CV LAB;  Service: Cardiovascular;  Laterality: N/A;  . TEE WITHOUT CARDIOVERSION N/A 06/09/2016   Procedure: TRANSESOPHAGEAL ECHOCARDIOGRAM (TEE);  Surgeon: Delight Ovens, MD;  Location: [facility] OR;  Service: Open Heart Surgery;  Laterality: N/A;    Family History  Problem Relation Age of Onset  . Heart disease Sister   . Heart disease Son     Social history:  reports that she has never smoked. She has never used smokeless tobacco. She reports that she does not drink alcohol or use drugs.    Allergies  Allergen Reactions  . Other Hives    Mycins-   Pt has Glaucoma in Right eye, Blind in Left eye .... PLEASE DO NOT GIVE ANYTHING TO PATIENT THAT MIGHT DESTROY VISION   . Prednisone Other (See Comments)    Altered mental status  . Statins Other (See Comments)    MYALGIAS Weakness, muscle aches, and pain  .  Erythromycin     UNSPECIFIED REACTION   . Tylenol [Acetaminophen] Other (See Comments)    Hallucinations    Medications:  Prior to Admission medications   Medication Sig Start Date End Date Taking? Authorizing Provider  ALPRAZolam (XANAX) 0.25 MG tablet Take 0.5 tablets (0.125 mg total) by mouth 3 (three) times daily as needed for anxiety. Patient taking differently: Take 0.125 mg by mouth 2 (two) times daily. One half in the morning One half at night 06/15/16  Yes Barrett, Erin R, PA-C  aspirin EC 325 MG EC tablet Take 1 tablet (325 mg total) by mouth daily. 06/15/16  Yes Barrett, Erin R, PA-C  esomeprazole (NEXIUM 24HR) 20 MG capsule Take 1 capsule (20 mg total) by mouth at bedtime.  06/30/16  Yes Jodelle GrossLawrence, Kathryn M, NP  fexofenadine (ALLEGRA) 180 MG tablet Take 180 mg by mouth daily.   Yes [provider]  metoprolol tartrate (LOPRESSOR) 25 MG tablet TAKE ONE TABLET BY MOUTH 2 TIMES A DAY. 09/29/16  Yes Jodelle GrossLawrence, Kathryn M, NP  Omega-3 Fatty Acids (FISH OIL) 1000 MG CAPS Take 1,000 mg by mouth daily.   Yes [provider]  polyethylene glycol (MIRALAX) packet Take 17 g by mouth daily. 06/15/16  Yes Barrett, Erin R, PA-C  potassium chloride SA (K-DUR,KLOR-CON) 20 MEQ tablet TAKE ONE TABLET BY MOUTH DAILY. 09/25/16  Yes Jodelle GrossLawrence, Kathryn M, NP  rosuvastatin (CRESTOR) 5 MG tablet Take 1 tablet (5 mg total) by mouth daily. 11/28/16  Yes Branch, Dorothe PeaJonathan F, MD  timolol (BETIMOL) 0.5 % ophthalmic solution Place 1 drop into the right eye 2 (two) times daily.    Yes [provider]  vitamin B-12 (CYANOCOBALAMIN) 1000 MCG tablet Take 1,000 mcg by mouth at bedtime.    Yes [provider]  amLODipine (NORVASC) 2.5 MG tablet Take 1 tablet (2.5 mg total) by mouth daily. 09/29/16 12/28/16  Jodelle GrossLawrence, Kathryn M, NP  furosemide (LASIX) 20 MG tablet Take 1 tablet (20 mg total) by mouth daily. 09/29/16 12/28/16  Jodelle GrossLawrence, Kathryn M, NP    ROS:  Out of a complete 14 system review of symptoms, the patient complains only of the following symptoms, and all other reviewed systems are negative.  Numbness Chest pain  Blood pressure 128/72, pulse (!) 46, height 4' 7.5" (1.41 m), weight 157 lb 8 oz (71.4 kg).  Physical Exam  General: The patient is alert and cooperative at the time of the examination.  The patient is moderately to markedly obese.  Skin: No significant peripheral edema is noted.   Neurologic Exam  Mental status: The patient is alert and oriented x 3 at the time of the examination. The patient has apparent normal recent and remote memory, with an apparently normal attention span and concentration ability.   Cranial nerves: Facial symmetry is present.  Speech is normal, no aphasia or dysarthria is noted. Extraocular movements are full. Visual fields are full.  Motor: The patient has good strength in all 4 extremities.  Sensory examination: Soft touch sensation is symmetric on the face, arms, and legs.  Coordination: The patient has good finger-nose-finger and heel-to-shin bilaterally.  Gait and station: The patient has a slightly wide-based gait, the patient uses a cane for ambulation. Tandem gait is unsteady. Romberg is negative. No drift is seen.  Reflexes: Deep tendon reflexes are symmetric.   Assessment/Plan:  1.  Episodic numbness  2.  Cerebrovascular disease  3.  Gait disturbance  The patient is doing well at this time.  There  is no indication for further evaluation or workup.  The patient indicates that her memory is doing well, the family concurs with this.  The patient will follow-up through this office if needed.  Marlan Palau MD 01/26/2017 10:14 AM  [facility] [address] [place], [place] [address]  Phone [phone] Fax [phone]

## 2017-02-12 ENCOUNTER — Telehealth: Payer: Self-pay | Admitting: Adult Health

## 2017-02-12 ENCOUNTER — Ambulatory Visit: Payer: Medicare HMO | Admitting: Neurology

## 2017-02-12 NOTE — Telephone Encounter (Signed)
Pt went to see Dr. Anne HahnWillis her Neuro Dr and they told her, her pulse rate was low being 46. She's been feeling bad and really tired, has no energy. Pt has kept track of it for a few days and it's stayed around the 50's please give pt a call @ [phone]  Pt would like to know if she's to continue taking her furosemide (LASIX) 20 MG tablet [[id number]][[id number]] ENDED --she's completely out and was going to call in for a refill

## 2017-02-13 MED ORDER — METOPROLOL TARTRATE 25 MG PO TABS: 12.5000 mg | ORAL_TABLET | Freq: Two times a day (BID) | ORAL | 3 refills | Status: DC

## 2017-02-13 NOTE — Addendum Note (Signed)
Addended by: Kerney ElbePINNIX, Maeby Vankleeck G on: 02/13/2017 11:31 AM   Modules accepted: Orders

## 2017-02-13 NOTE — Telephone Encounter (Signed)
Please have her decrease her metoprolol to 12.5 mg BID. Have her continue to keep track of her pulse.  If she continues to be symptomatic, she will need to be seen. May need to put on a cardiac monitor

## 2017-02-13 NOTE — Telephone Encounter (Signed)
Pt notified and voiced understanding 

## 2017-03-08 DIAGNOSIS — I10 Essential (primary) hypertension: Secondary | ICD-10-CM | POA: Diagnosis not present

## 2017-03-13 ENCOUNTER — Telehealth: Payer: Self-pay

## 2017-03-13 DIAGNOSIS — Z79899 Other long term (current) drug therapy: Secondary | ICD-10-CM

## 2017-03-13 NOTE — Telephone Encounter (Signed)
Patient very anxious about recent BP readings: 156/100,164/89, 155/83, 140/94. She did have one reading 126/80. She wanted to make sure her readings didn't need medical attention.Has apt on 03/28/17

## 2017-03-14 ENCOUNTER — Other Ambulatory Visit: Payer: Self-pay | Admitting: Cardiology

## 2017-03-15 MED ORDER — HYDROCHLOROTHIAZIDE 25 MG PO TABS: 12.5000 mg | ORAL_TABLET | Freq: Every day | ORAL | 3 refills | Status: DC

## 2017-03-15 NOTE — Telephone Encounter (Signed)
I spoke with patient.She understands to start HCTZ 12.5 mg daily, continue to monitor BP, and come in on Monday , 03/19/17 at 10 am for BP check.Lab slip for BMET will be at front desk for her.Ruthine DoseHannah Carter will give to patient at check in

## 2017-03-15 NOTE — Addendum Note (Signed)
Addended by: Marlyn CorporalARLTON, Dustin Bumbaugh A on: 03/15/2017 10:40 AM   Modules accepted: Orders

## 2017-03-15 NOTE — Telephone Encounter (Signed)
Begin HCTZ 12.5 mg daily. Have her come in for BP check on Monday. Continue to monitor BP. BMET on Monday.

## 2017-03-19 ENCOUNTER — Ambulatory Visit (INDEPENDENT_AMBULATORY_CARE_PROVIDER_SITE_OTHER): Payer: Medicare HMO | Admitting: *Deleted

## 2017-03-19 ENCOUNTER — Other Ambulatory Visit (HOSPITAL_COMMUNITY)
Admission: RE | Admit: 2017-03-19 | Discharge: 2017-03-19 | Disposition: A | Discharge status: Home / Self Care | Payer: Medicare HMO | Source: Ambulatory Visit | Attending: Adult Health | Admitting: Adult Health

## 2017-03-19 ENCOUNTER — Encounter: Payer: Self-pay | Admitting: *Deleted

## 2017-03-19 VITALS — BP 115/72 | HR 53 | Ht <= 58 in | Wt 149.8 lb

## 2017-03-19 DIAGNOSIS — I10 Essential (primary) hypertension: Secondary | ICD-10-CM

## 2017-03-19 DIAGNOSIS — Z79899 Other long term (current) drug therapy: Secondary | ICD-10-CM | POA: Insufficient documentation

## 2017-03-19 LAB — BASIC METABOLIC PANEL
Anion gap: 12 (ref 5–15)
BUN: 17 mg/dL (ref 6–20)
CALCIUM: 9.8 mg/dL (ref 8.9–10.3)
CO2: 28 mmol/L (ref 22–32)
CREATININE: 0.94 mg/dL (ref 0.44–1.00)
Chloride: 98 mmol/L — ABNORMAL LOW (ref 101–111)
GFR calc Af Amer: >60 mL/min (ref >60)
GFR calc non Af Amer: 57 mL/min — ABNORMAL LOW (ref >60)
GLUCOSE: 100 mg/dL — AB (ref 65–99)
Potassium: 3.8 mmol/L (ref 3.5–5.1)
Sodium: 138 mmol/L (ref 135–145)

## 2017-03-19 NOTE — Patient Instructions (Addendum)
Medication Instructions:   Your physician recommends that you continue on your current medications as directed. Please refer to the Current Medication list given to you today.  Discuss with Randall AnBrittany Strader PA about whether or not you need to be on lasix at your upcoming appointment.   Labwork:  NONE  Testing/Procedures:  Your physician recommends that you return for lab work today to check your BMET.  Follow-Up:  Your physician recommends that you schedule a follow-up appointment in: as planned.  Any Other Special Instructions Will Be Listed Below (If Applicable).  If you need a refill on your cardiac medications before your next appointment, please call your pharmacy.

## 2017-03-19 NOTE — Progress Notes (Signed)
Reviewed. Agree with assessment and plan

## 2017-03-19 NOTE — Progress Notes (Addendum)
Patient presents today for BP check per recent telephone encounter. Patient has taken all doses of medications without any noted sided effects. No c/o dizziness, chest pain or sob. Patient brought her home BP cuff today to be checked for accuracy. Medications reviewed. Home BP cuff BP was 125/79 & HR 51. Daughter expressed concerns about whether patient needed to continue lasix since this was mentioned at her last ov with Lyman BishopLawrence. Daughter advised that she can discuss this with Randall AnBrittany Strader at her upcoming appointment.  Patient encouraged to continue home BP monitoring with same cuff, same arm, and same time of the day. Patient also taught to wait about 5-10 minutes before taking her BP after she sits down since she was taking immediately after walking. Patient verbalized understanding of plan.

## 2017-03-22 ENCOUNTER — Telehealth: Payer: Self-pay | Admitting: Student

## 2017-03-22 NOTE — Telephone Encounter (Signed)
Please have her hold off on laxative for now and see if this is better before changing HCTZ

## 2017-03-22 NOTE — Telephone Encounter (Signed)
Patient's daughter states that patient has had diarrhea since 12/25 after starting new medicince. / tg

## 2017-03-22 NOTE — Telephone Encounter (Signed)
Spoke with pt's daughter. She complains that her mother has had constant diarrhea since starting HCTZ on Monday. She states that it is much worse today, but realized that her mother has continued to take her Metamucil laxitive drink even after having the stomach upset from the HCTZ. She asks if her mother could stop the HCTZ. Please advise.

## 2017-03-22 NOTE — Telephone Encounter (Signed)
Made daughter aware. She voiced understanding. She states that she will be sure that her mother stops the laxatives. She will call next week to update us on symptoms.

## 2017-03-26 NOTE — Progress Notes (Signed)
Cardiology Office Note    Date:  03/28/2017   ID:  Sheryl ChanceShirley M Ruhland, DOB 03/20/1941, MRN [id number]  PCP:  Elfredia NevinsFusco, Lawrence, MD  Cardiologist: Dr. Purvis SheffieldKoneswaran   Chief Complaint  Patient presents with  . Follow-up    6 month visit    History of Present Illness:    Sheryl Carter is a 76 y.o. female with past medical history of CAD (s/p CABG in 05/2016 with LIMA-LAD, SVG-D2, SVG-OM, SVG-RCA), HTN, and HLD who presents to the office today for 1519-month follow-up.   She was last examined by Joni ReiningKathryn Lawrence, DNP in 09/2016 and reported continual episodes of soreness along her sternal region. Lisinopril was discontinued at that time as it was thought she was having an allergic reaction to the medication. She was started on Amlodipine and continued on Lasix 20 mg daily.  She has followed up with the Lipid clinic since and has been started on Crestor 5 mg daily without any noted side effects. Lipids were rechecked in 12/2016 with LDL being down to 87 (previously 170).   She has called the office in the interim and reported blood pressure was elevated, therefore she was started on HCTZ 12.5 mg daily in addition to being continued on Lasix 20mg  daily.    In talking with the patient today, she reports having fatigue on a daily basis which she notes is usually worse when her HR is in the 40's. She brings with her a list of BP and HR recordings with HR being in the mid-40's to low-50's and BP in the 130's-150's/70's - 90's. Has also noticed an increase in thirst since being started on HCTZ.   Reports having pain along her sternal incision but denies any exertional chest pain. No recent orthopnea, PND, or lower extremity edema. Breathing has been at baseline.    Past Medical History:  Diagnosis Date  . Anxiety   . Blindness of left eye   . CAD (coronary artery disease)    a. s/p CABG in 05/2016 with LIMA-LAD, SVG-D2, SVG-OM, SVG-RCA  . Complication of anesthesia    has gas trapped in her  body after  . DVT (deep venous thrombosis) (HCC)   . Ear problems   . Family history of adverse reaction to anesthesia    daughter has gas trapped in her body postop as well  . Glaucoma   . Hypercholesterolemia   . Hypertension   . Pre-diabetes    [facility], HgbA1c was 5.4  . Stroke (HCC)    having TIA's    Past Surgical History:  Procedure Laterality Date  . BREAST SURGERY Left    cyst- L breast  . CHOLECYSTECTOMY    . CORONARY ARTERY BYPASS GRAFT N/A 06/09/2016   Procedure: CORONARY ARTERY BYPASS GRAFTING (CABG) times 4 using the left internal mammary artery and bilateral thigh greater saphenous veins harvested endoscopically. LIMA to LAD, SVG to DIAGONAL 2, SVG to OM, SVG to RCA;  Surgeon: Delight OvensEdward B Gerhardt, MD;  [facility];  Service: Open Heart Surgery;  Laterality: N/A;  . ENDARTERECTOMY Left 05/29/2016   Procedure: LEFT CAROTID ENDARTERECTOMY WITH PATCH ANGIOPLASTY;  Surgeon: Larina Earthlyodd F Early, MD;  [facility];  Service: Vascular;  Laterality: Left;  . EYE SURGERY Right    cataract  . LEFT HEART CATH AND CORONARY ANGIOGRAPHY N/A 06/07/2016   Procedure: Left Heart Cath and Coronary Angiography;  Surgeon: Iran OuchMuhammad A Arida, MD;  [facility];  Service: Cardiovascular;  Laterality: N/A;  . TEE WITHOUT CARDIOVERSION N/A 06/09/2016   Procedure: TRANSESOPHAGEAL ECHOCARDIOGRAM (TEE);  Surgeon: Delight Ovens, MD;  Location: [facility] OR;  Service: Open Heart Surgery;  Laterality: N/A;    Current Medications: Outpatient Medications Prior to Visit  Medication Sig Dispense Refill  . ALPRAZolam (XANAX) 0.25 MG tablet Take 0.25 mg by mouth 2 (two) times daily.    Marland Kitchen amLODipine (NORVASC) 2.5 MG tablet Take 1 tablet (2.5 mg total) by mouth daily. 90 tablet 3  . aspirin EC 325 MG EC tablet Take 1 tablet (325 mg total) by mouth daily. 30 tablet 0  . esomeprazole (NEXIUM 24HR) 20 MG capsule Take 1 capsule (20 mg total) by mouth at bedtime. 30  capsule 3  . fexofenadine (ALLEGRA) 180 MG tablet Take 180 mg by mouth daily.    . Omega-3 Fatty Acids (FISH OIL) 1000 MG CAPS Take 1,000 mg by mouth daily.    . rosuvastatin (CRESTOR) 5 MG tablet TAKE 1 TABLET BY MOUTH ONCE A DAY. 90 tablet 3  . timolol (BETIMOL) 0.5 % ophthalmic solution Place 1 drop into the right eye 2 (two) times daily.     . vitamin B-12 (CYANOCOBALAMIN) 1000 MCG tablet Take 1,000 mcg by mouth at bedtime.     . furosemide (LASIX) 20 MG tablet Take 1 tablet (20 mg total) by mouth daily. 90 tablet 3  . hydrochlorothiazide (HYDRODIURIL) 25 MG tablet Take 0.5 tablets (12.5 mg total) by mouth daily. 45 tablet 3  . metoprolol tartrate (LOPRESSOR) 25 MG tablet Take 0.5 tablets (12.5 mg total) by mouth 2 (two) times daily. 90 tablet 3  . potassium chloride SA (K-DUR,KLOR-CON) 20 MEQ tablet TAKE ONE TABLET BY MOUTH DAILY. 90 tablet 3  . polyethylene glycol (MIRALAX) packet Take 17 g by mouth daily. 14 each 0   No facility-administered medications prior to visit.      Allergies:   Other; Prednisone; Statins; Erythromycin; and Tylenol [acetaminophen]   Social History   Socioeconomic History  . Marital status: Divorced    Spouse name: None  . Number of children: 4  . Years of education: 10  . Highest education level: None  Social Needs  . Financial resource strain: None  . Food insecurity - worry: None  . Food insecurity - inability: None  . Transportation needs - medical: None  . Transportation needs - non-medical: None  Occupational History    Comment: retired  Tobacco Use  . Smoking status: Never Smoker  . Smokeless tobacco: Never Used  Substance and Sexual Activity  . Alcohol use: No  . Drug use: No  . Sexual activity: None  Other Topics Concern  . None  Social History Narrative   Lives with daughter   No caffeine since Dec 2017     Family History:  The patient's family history includes Heart disease in her sister and son.   Review of Systems:   Please  see the history of present illness.     General:  No chills, fever, night sweats or weight changes. Positive for fatigue.  Cardiovascular:  No chest pain, dyspnea on exertion, edema, orthopnea, palpitations, paroxysmal nocturnal dyspnea. Dermatological: No rash, lesions/masses Respiratory: No cough, dyspnea Urologic: No hematuria, dysuria Abdominal:   No nausea, vomiting, diarrhea, bright red blood per rectum, melena, or hematemesis Neurologic:  No visual changes, wkns, changes in mental status. All other systems reviewed and are otherwise negative except as noted above.   Physical Exam:  VS:  BP 128/66   Pulse (!) 52   Ht 4\' 7"  (1.397 m)   Wt 151 lb 3.2 oz (68.6 kg)   SpO2 98%   BMI 35.14 kg/m    General: Well developed, well nourished Caucasian female appearing in no acute distress. Head: Normocephalic, atraumatic, sclera non-icteric, no xanthomas, nares are without discharge.  Neck: No carotid bruits. JVD not elevated.  Lungs: Respirations regular and unlabored, without wheezes or rales.  Heart: Regular rhythm, bradycardia rate. No S3 or S4.  No murmur, no rubs, or gallops appreciated. Sternal scar appears well-healed.  Abdomen: Soft, non-tender, non-distended with normoactive bowel sounds. No hepatomegaly. No rebound/guarding. No obvious abdominal masses. Msk:  Strength and tone appear normal for age. No joint deformities or effusions. Extremities: No clubbing or cyanosis. No lower extremity edema.  Distal pedal pulses are 2+ bilaterally. Neuro: Alert and oriented X 3. Moves all extremities spontaneously. No focal deficits noted. Psych:  Responds to questions appropriately with a normal affect. Skin: No rashes or lesions noted  Wt Readings from Last 3 Encounters:  03/28/17 151 lb 3.2 oz (68.6 kg)  03/19/17 149 lb 12.8 oz (67.9 kg)  01/26/17 157 lb 8 oz (71.4 kg)     Studies/Labs Reviewed:   EKG:  EKG is not ordered today.   Recent Labs: 06/10/2016: Magnesium  2.1 07/30/2016: Hemoglobin 12.0; Platelets 225 09/29/2016: ALT 10 03/19/2017: BUN 17; Creatinine, Ser 0.94; Potassium 3.8; Sodium 138   Lipid Panel    Component Value Date/Time   CHOL 157 12/29/2016 0955   TRIG 97 12/29/2016 0955   HDL 51 12/29/2016 0955   CHOLHDL 5.7 (H) 09/29/2016 0859   VLDL 27 09/29/2016 0859   LDLCALC 87 12/29/2016 0955    Additional studies/ records that were reviewed today include:   Echocardiogram: 05/2016 Study Conclusions  - Left ventricle: The cavity size was normal. There was mild focal   basal hypertrophy of the septum. Systolic function was normal.   The estimated ejection fraction was in the range of 50% to 55%.   There is akinesis of the inferior myocardium. There is   hypokinesis of the midinferoseptal myocardium. - Mitral valve: Calcified annulus. There was trivial regurgitation.  Cardiac Catheterization: 05/2016  Prox RCA lesion, 80 %stenosed.  Mid RCA lesion, 100 %stenosed.  Ost Cx to Prox Cx lesion, 90 %stenosed.  Ost LAD lesion, 90 %stenosed.  LM lesion, 80 %stenosed.  Prox LAD lesion, 90 %stenosed.  Ost 1st Diag to 1st Diag lesion, 80 %stenosed.  Ost 2nd Mrg to 2nd Mrg lesion, 60 %stenosed.  LV end diastolic pressure is moderately elevated.   1. Severe three-vessel and distal left main disease. 2. Moderately elevated left ventricular end-diastolic pressure. 3. Left ventricular angiography was not performed. EF was 50-55% by echo.  Recommendations: I consulted CVTS for CABG. The patient has good targets. It was determined that she had no stroke during this admission. Avoid cardiac catheterization via the right radial artery in the future due to severe innominate artery tortuosity.   Assessment:    1. Coronary artery disease involving native coronary artery of native heart without angina pectoris   2. Bradycardia   3. Essential hypertension   4. Hyperlipidemia LDL goal <70      Plan:   In order of problems listed  above:  1. CAD - s/p CABG in 05/2016 with LIMA-LAD, SVG-D2, SVG-OM, SVG-RCA. She has pain along her sternal incision which is reproducible with palpation and denies any exertional symptoms.  -  continue ASA and statin therapy along with Amlodipine. Will stop Lopressor as outlined below in the setting of her fatigue and bradycardia.   2. Bradycardia - she reports having fatigue on a daily basis which she notes is usually worse when her HR is in the 40's. HR in the low-50's during today's visit. She has undergone multiple dose reductions and is on Lopressor 12.5mg  BID and is still having bradycardia with associated symptoms. Will stop Lopressor for now and she will continue to follow her HR at home.   3. HTN - BP is well-controlled at 128/66 during today's visit. - it is unclear why she was on dual-diuretic therapy with both HCTZ and Lasix. Will stop Lasix and use on a PRN basis for weight gain or lower extremity edema. Titrate HCTZ up to 25mg  daily as BP has remained elevated when checked at home. Repeat BMET in 4 weeks to reassess K+ and creatinine. Continue Amlodipine at current dosing (can further titrate if additional BP control is needed).   4. HLD - followed by the Lipid Clinic. She was started on Crestor 5 mg daily in 09/2016. Lipids were rechecked in 12/2016 with LDL being down to 87 (previously 170).  - continue Crestor 5mg  daily. Unable to tolerate higher dosing.    Medication Adjustments/Labs and Tests Ordered: Current medicines are reviewed at length with the patient today.  Concerns regarding medicines are outlined above.  Medication changes, Labs and Tests ordered today are listed in the Patient Instructions below. Patient Instructions  Medication Instructions:  Your physician has recommended you make the following change in your medication:  Stop Taking Metoprolol  Take HCTZ 25 mg Daily  Take Lasix as needed for lower leg swelling and weight gain.  Take Potassium on the days  you take Lasix.   Labwork: Your physician recommends that you return for lab work in: 4 Weeks  Testing/Procedures: NONE   Follow-Up: Your physician recommends that you schedule a follow-up appointment in: 2-3 Months with Dr. Purvis Sheffield.   Any Other Special Instructions Will Be Listed Below (If Applicable).  Thank you for choosing [facility]!    If you need a refill on your cardiac medications before your next appointment, please call your pharmacy.    Signed, Ellsworth Lennox, PA-C  03/28/2017 4:24 PM    [facility] [address] [place], [address] [place], [place]  [address] Phone: [phone]; Fax: [phone]  [address], [address] [place], [place] [address] Phone: [phone]

## 2017-03-28 ENCOUNTER — Ambulatory Visit: Payer: Medicare HMO | Admitting: Student

## 2017-03-28 ENCOUNTER — Encounter: Payer: Self-pay | Admitting: Student

## 2017-03-28 VITALS — BP 128/66 | HR 52 | Ht <= 58 in | Wt 151.2 lb

## 2017-03-28 DIAGNOSIS — I10 Essential (primary) hypertension: Secondary | ICD-10-CM | POA: Diagnosis not present

## 2017-03-28 DIAGNOSIS — I2510 Atherosclerotic heart disease of native coronary artery without angina pectoris: Secondary | ICD-10-CM | POA: Diagnosis not present

## 2017-03-28 DIAGNOSIS — R001 Bradycardia, unspecified: Secondary | ICD-10-CM

## 2017-03-28 DIAGNOSIS — E785 Hyperlipidemia, unspecified: Secondary | ICD-10-CM

## 2017-03-28 MED ORDER — FUROSEMIDE 20 MG PO TABS: 20.0000 mg | ORAL_TABLET | ORAL | 6 refills | Status: DC | PRN

## 2017-03-28 MED ORDER — HYDROCHLOROTHIAZIDE 25 MG PO TABS: 25.0000 mg | ORAL_TABLET | Freq: Every day | ORAL | 3 refills | Status: DC

## 2017-03-28 MED ORDER — POTASSIUM CHLORIDE CRYS ER 20 MEQ PO TBCR: 20.0000 meq | EXTENDED_RELEASE_TABLET | Freq: Every day | ORAL | 6 refills | Status: DC | PRN

## 2017-03-28 NOTE — Patient Instructions (Signed)
Medication Instructions:  Your physician has recommended you make the following change in your medication:  Stop Taking Metoprolol  Take HCTZ 25 mg Daily  Take Lasix as needed for lower leg swelling and weight gain.  Take Potassium on the days you take Lasix.     Labwork: Your physician recommends that you return for lab work in: 4 Weeks   Testing/Procedures: NONE   Follow-Up: Your physician recommends that you schedule a follow-up appointment in: 2-3 Months with Dr. Purvis SheffieldKoneswaran.    Any Other Special Instructions Will Be Listed Below (If Applicable).   Thank you for choosing [facility]!    If you need a refill on your cardiac medications before your next appointment, please call your pharmacy.

## 2017-04-05 ENCOUNTER — Telehealth: Payer: Self-pay | Admitting: Cardiovascular Disease

## 2017-04-05 NOTE — Telephone Encounter (Signed)
Returned pt call, she states that her blood pressure was up a little today. (162/80, 164/88) she states she is not feeling bad but wanted to call to let us know she is a little concerned. She is having some swelling in her feel (minimal) but she states that she is going to take her prn lasix/potassium today. I advised her that it was in her instructions from LOV that she was allowed to take her lasix PRN for swelling. She voiced understanding.

## 2017-04-05 NOTE — Telephone Encounter (Signed)
Please give pt a call concerning her BP [phone]

## 2017-04-12 ENCOUNTER — Telehealth: Payer: Self-pay | Admitting: Cardiovascular Disease

## 2017-04-12 MED ORDER — AMLODIPINE BESYLATE 5 MG PO TABS: 5.0000 mg | ORAL_TABLET | Freq: Every day | ORAL | 3 refills | Status: DC

## 2017-04-12 NOTE — Telephone Encounter (Signed)
Patient will increase Amlodipine to 5 mg daily.Right now she will take 2- 2.5 mg tablets until she runs out.She will let us know when she wants new rx sent to [facility]

## 2017-04-12 NOTE — Telephone Encounter (Signed)
Pt has called back concerning her BP --states she's drinking a lot of water and her head is starting to hurt

## 2017-04-12 NOTE — Telephone Encounter (Signed)
Patient would like to speak with nurse regarding elevated BP this AM / tg

## 2017-04-12 NOTE — Telephone Encounter (Signed)
BP this am was 182/109, I asked her to re-take now and she got 15/11, BP last 2 days were 159/94, and 155/99  She wants to know "what blood pressure is too high ?" She said she gets anxious if she sees elevated reading and is afraid to re-take

## 2017-04-12 NOTE — Telephone Encounter (Signed)
Our goal would be < 130/80 as it was during her recent office visit. If having elevated readings, can further titrate Amlodipine to 5mg  daily. Please inform the patient it can take a few weeks for the medication change to have its effect. Would check BP once daily.   Thanks, Ellsworth LennoxBrittany M Juneau Doughman, PA-C 04/12/2017, 3:26 PM

## 2017-04-26 ENCOUNTER — Telehealth: Payer: Self-pay | Admitting: Cardiovascular Disease

## 2017-04-26 NOTE — Telephone Encounter (Signed)
I will forward to B.Strader PA-C for advice

## 2017-04-26 NOTE — Telephone Encounter (Signed)
I spoke with patient and she confirmed she is off beta blocker, she is going to make an apt with Dr.Fusco

## 2017-04-26 NOTE — Telephone Encounter (Signed)
If BP was stable and symptoms improved with Allegra, I do not think her dizziness was secondary to blood pressure. Please confirm that she is no longer taking Lopressor as she was previously having issues with bradycardia and we discontinued the medication at her last visit.   Thanks, [place]ada[place]ittany

## 2017-04-26 NOTE — Telephone Encounter (Signed)
Per phone call from pt-- woke up this morning really dizzy. She went to the bathroom and had to come back and lay down, she said she laid on her left side and the dizziness eased up. States she took some Allegra and it eased up with that also. Her BP was 140/95, states she ate lunch and she started to hurt in the back of her head. She is feeling really nervous and has taken a nerve pill. No complaints of Chest pain or SOB. Please give her a call @ [phone]

## 2017-04-30 ENCOUNTER — Telehealth: Payer: Self-pay | Admitting: Cardiovascular Disease

## 2017-04-30 DIAGNOSIS — I10 Essential (primary) hypertension: Secondary | ICD-10-CM | POA: Diagnosis not present

## 2017-04-30 DIAGNOSIS — I2510 Atherosclerotic heart disease of native coronary artery without angina pectoris: Secondary | ICD-10-CM | POA: Diagnosis not present

## 2017-04-30 LAB — BASIC METABOLIC PANEL
BUN: 12 mg/dL (ref 7–25)
CALCIUM: 10.1 mg/dL (ref 8.6–10.4)
CO2: 32 mmol/L (ref 20–32)
Chloride: 99 mmol/L (ref 98–110)
Creat: 0.88 mg/dL (ref 0.60–0.93)
GLUCOSE: 100 mg/dL (ref 65–139)
POTASSIUM: 3.6 mmol/L (ref 3.5–5.3)
SODIUM: 140 mmol/L (ref 135–146)

## 2017-04-30 MED ORDER — HYDROCHLOROTHIAZIDE 25 MG PO TABS: 25.0000 mg | ORAL_TABLET | Freq: Every day | ORAL | 3 refills | Status: DC

## 2017-04-30 NOTE — Telephone Encounter (Signed)
°*  STAT* If patient is at the pharmacy, call can be transferred to refill team.   1. Which medications need to be refilled? (please list name of each medication and dose if known)  hydrochlorothiazide (HYDRODIURIL) 25 MG tablet [[id number]][[id number]]   2. Which pharmacy/location (including street and city if local pharmacy) is medication to be sent to? [facility]  3. Do they need a 30 day or 90 day supply? 90 day  She's needing a new Rx sent

## 2017-05-14 ENCOUNTER — Telehealth: Payer: Self-pay | Admitting: Cardiovascular Disease

## 2017-05-14 MED ORDER — AMLODIPINE BESYLATE 5 MG PO TABS: 5.0000 mg | ORAL_TABLET | Freq: Every day | ORAL | 3 refills | Status: DC

## 2017-05-14 NOTE — Telephone Encounter (Signed)
Refill complete 

## 2017-05-14 NOTE — Telephone Encounter (Signed)
Needs RX for new dosage of Amlodopine sent to [facility] / tg

## 2017-05-30 ENCOUNTER — Encounter: Payer: Self-pay | Admitting: Cardiovascular Disease

## 2017-05-30 ENCOUNTER — Ambulatory Visit: Payer: Medicare HMO | Admitting: Cardiovascular Disease

## 2017-05-30 VITALS — BP 126/68 | HR 54 | Ht <= 58 in | Wt 141.0 lb

## 2017-05-30 DIAGNOSIS — I25708 Atherosclerosis of coronary artery bypass graft(s), unspecified, with other forms of angina pectoris: Secondary | ICD-10-CM

## 2017-05-30 DIAGNOSIS — F411 Generalized anxiety disorder: Secondary | ICD-10-CM | POA: Diagnosis not present

## 2017-05-30 DIAGNOSIS — E785 Hyperlipidemia, unspecified: Secondary | ICD-10-CM

## 2017-05-30 DIAGNOSIS — R001 Bradycardia, unspecified: Secondary | ICD-10-CM | POA: Diagnosis not present

## 2017-05-30 DIAGNOSIS — I10 Essential (primary) hypertension: Secondary | ICD-10-CM | POA: Diagnosis not present

## 2017-05-30 DIAGNOSIS — Z9889 Other specified postprocedural states: Secondary | ICD-10-CM | POA: Diagnosis not present

## 2017-05-30 NOTE — Patient Instructions (Signed)
Medication Instructions:  Your physician recommends that you continue on your current medications as directed. Please refer to the Current Medication list given to you today.   Labwork: NONE   Testing/Procedures: NONE   Follow-Up: Your physician wants you to follow-up in: 6 Months with Dr. Koneswaran. You will receive a reminder letter in the mail two months in advance. If you don't receive a letter, please call our office to schedule the follow-up appointment.   Any Other Special Instructions Will Be Listed Below (If Applicable).     If you need a refill on your cardiac medications before your next appointment, please call your pharmacy.  Thank you for choosing [facility]!   

## 2017-05-30 NOTE — Progress Notes (Signed)
SUBJECTIVE: The patient presents to establish care with me.  This is my first time meeting her.  Past medical history includes coronary artery disease with a history of four-vessel CABG in March 2018 with LIMA-LAD, SVG-D2, SVG-OM, SVG-RCA.  She also has hypertension and hyperlipidemia.  She has a history of left carotid endarterectomy as well.  Echocardiogram in March 2018 demonstrated normal left ventricular systolic function, LVEF 50-55%, with akinesis of the inferior myocardium and hypokinesis of the mid inferoseptal myocardium.  Carotid Dopplers in October 2018 demonstrated a patent left carotid endarterectomy in 1-39% right internal carotid artery stenosis.  She is here with her granddaughter, Lanora Manislizabeth, who will be graduating with degrees in English and history from [employer] in December and plans to pursue a PhD in [place] at [employer].  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  She has a long history of anxiety.  Her daughter and her granddaughter are concerned about her anxiety which tends to elevate her blood pressure.  I reviewed her blood pressure log which demonstrates systolic readings from 130-140 with isolated readings in the 150s and diastolic readings in the 80s-90s.  She does tell me that she gets very anxious when checking her blood pressure.  We checked it with her cuff as well as our cuff in the office and blood pressure readings were normal.  She occasionally has some right sided chest pain and some left-sided "chest twinges ".  She said she is waiting for the son to come mouses that she can go outdoors.  She also has some neck numbness and left arm numbness.  She denies exertional chest pain and exertional dyspnea.        Review of Systems: As per "subjective", otherwise negative.  Allergies  Allergen Reactions  . Other Hives    Mycins-   Pt has Glaucoma in Right eye, Blind in Left eye .... PLEASE DO NOT GIVE ANYTHING TO PATIENT THAT MIGHT DESTROY VISION   .  Prednisone Other (See Comments)    Altered mental status  . Statins Other (See Comments)    MYALGIAS Weakness, muscle aches, and pain  . Erythromycin     UNSPECIFIED REACTION   . Tylenol [Acetaminophen] Other (See Comments)    Hallucinations    Current Outpatient Medications  Medication Sig Dispense Refill  . ALPRAZolam (XANAX) 0.25 MG tablet Take 0.25 mg by mouth 2 (two) times daily.    Marland Kitchen. amLODipine (NORVASC) 5 MG tablet Take 1 tablet (5 mg total) by mouth daily. 90 tablet 3  . aspirin EC 325 MG EC tablet Take 1 tablet (325 mg total) by mouth daily. 30 tablet 0  . esomeprazole (NEXIUM 24HR) 20 MG capsule Take 1 capsule (20 mg total) by mouth at bedtime. 30 capsule 3  . fexofenadine (ALLEGRA) 180 MG tablet Take 180 mg by mouth daily.    . hydrochlorothiazide (HYDRODIURIL) 25 MG tablet Take 1 tablet (25 mg total) by mouth daily. 90 tablet 3  . Omega-3 Fatty Acids (FISH OIL) 1000 MG CAPS Take 1,000 mg by mouth daily.    . rosuvastatin (CRESTOR) 5 MG tablet TAKE 1 TABLET BY MOUTH ONCE A DAY. 90 tablet 3  . timolol (BETIMOL) 0.5 % ophthalmic solution Place 1 drop into the right eye 2 (two) times daily.     . vitamin B-12 (CYANOCOBALAMIN) 1000 MCG tablet Take 1,000 mcg by mouth at bedtime.      No current facility-administered medications for this visit.  Past Medical History:  Diagnosis Date  . Anxiety   . Blindness of left eye   . CAD (coronary artery disease)    a. s/p CABG in 05/2016 with LIMA-LAD, SVG-D2, SVG-OM, SVG-RCA  . Complication of anesthesia    has gas trapped in her body after  . DVT (deep venous thrombosis) (HCC)   . Ear problems   . Family history of adverse reaction to anesthesia    daughter has gas trapped in her body postop as well  . Glaucoma   . Hypercholesterolemia   . Hypertension   . Pre-diabetes    pt. told by [facility] staff that she is prediabetic, HgbA1c was 5.4  . Stroke (HCC)    having TIA's    Past Surgical History:  Procedure Laterality Date    . BREAST SURGERY Left    cyst- L breast  . CHOLECYSTECTOMY    . CORONARY ARTERY BYPASS GRAFT N/A 06/09/2016   Procedure: CORONARY ARTERY BYPASS GRAFTING (CABG) times 4 using the left internal mammary artery and bilateral thigh greater saphenous veins harvested endoscopically. LIMA to LAD, SVG to DIAGONAL 2, SVG to OM, SVG to RCA;  Surgeon: Delight Ovens, MD;  Location: [facility] OR;  Service: Open Heart Surgery;  Laterality: N/A;  . ENDARTERECTOMY Left 05/29/2016   Procedure: LEFT CAROTID ENDARTERECTOMY WITH PATCH ANGIOPLASTY;  Surgeon: Larina Earthly, MD;  Location: [facility] OR;  Service: Vascular;  Laterality: Left;  . EYE SURGERY Right    cataract  . LEFT HEART CATH AND CORONARY ANGIOGRAPHY N/A 06/07/2016   Procedure: Left Heart Cath and Coronary Angiography;  Surgeon: Iran Ouch, MD;  Location: [facility] INVASIVE CV LAB;  Service: Cardiovascular;  Laterality: N/A;  . TEE WITHOUT CARDIOVERSION N/A 06/09/2016   Procedure: TRANSESOPHAGEAL ECHOCARDIOGRAM (TEE);  Surgeon: Delight Ovens, MD;  Location: [facility] OR;  Service: Open Heart Surgery;  Laterality: N/A;    Social History   Socioeconomic History  . Marital status: Divorced    Spouse name: Not on file  . Number of children: 4  . Years of education: 10  . Highest education level: Not on file  Social Needs  . Financial resource strain: Not on file  . Food insecurity - worry: Not on file  . Food insecurity - inability: Not on file  . Transportation needs - medical: Not on file  . Transportation needs - non-medical: Not on file  Occupational History    Comment: retired  Tobacco Use  . Smoking status: Never Smoker  . Smokeless tobacco: Never Used  Substance and Sexual Activity  . Alcohol use: No  . Drug use: No  . Sexual activity: Not on file  Other Topics Concern  . Not on file  Social History Narrative   Lives with daughter   No caffeine since Dec 2017     Vitals:   05/30/17 1352  BP: 126/68  Pulse: (!) 54  SpO2: 98%  Weight: 141  lb (64 kg)  Height: 4' 7.5" (1.41 m)    Wt Readings from Last 3 Encounters:  05/30/17 141 lb (64 kg)  03/28/17 151 lb 3.2 oz (68.6 kg)  03/19/17 149 lb 12.8 oz (67.9 kg)     PHYSICAL EXAM General: NAD HEENT: Blindness in left eye. Neck: No JVD, no thyromegaly. Lungs: Clear to auscultation bilaterally with normal respiratory effort. CV: Regular rate and rhythm, normal S1/S2, no S3/S4, no murmur. No pretibial or periankle edema.  No carotid bruit.   Abdomen: Soft, nontender, no distention.  Neurologic: Alert and oriented.  Psych: Normal affect. Skin: Normal. Musculoskeletal: No gross deformities.    ECG: Most recent ECG reviewed.   Labs: Lab Results  Component Value Date/Time   K 3.6 04/30/2017 10:23 AM   BUN 12 04/30/2017 10:23 AM   CREATININE 0.88 04/30/2017 10:23 AM   ALT 10 09/29/2016 08:59 AM   HGB 12.0 07/30/2016 09:30 AM     Lipids: Lab Results  Component Value Date/Time   LDLCALC 87 12/29/2016 09:55 AM   CHOL 157 12/29/2016 09:55 AM   TRIG 97 12/29/2016 09:55 AM   HDL 51 12/29/2016 09:55 AM       ASSESSMENT AND PLAN: 1.  Coronary disease with four-vessel CABG in March 2018: Symptomatically stable.  Continue aspirin, Crestor, and amlodipine.   2.  Bradycardia: She appears to be a symptomatic at present.  She was previously on metoprolol and this has since been stopped.  3.  Hypertension: Blood pressure log reviewed above.  Controlled on present therapy.  No changes.  4.  Hyperlipidemia: LDL 87 in October 2018.  Continue Crestor 5 mg daily.  She was unable to tolerate higher dosing.  5.  Anxiety/generalized anxiety disorder: I encouraged her to speak with her PCP about potentially seeing behavioral therapy.  She currently takes Ativan twice daily.  She may benefit from an alternative agent for maintenance therapy.    Disposition: Follow up 6 months  Time spent: 40 minutes, of which greater than 50% was spent reviewing symptoms, relevant blood tests  and studies, and discussing management plan with the patient.    Prentice Docker, M.D., F.A.C.C.

## 2017-06-06 ENCOUNTER — Ambulatory Visit: Payer: Medicare HMO | Admitting: Cardiovascular Disease

## 2017-07-05 ENCOUNTER — Encounter (HOSPITAL_COMMUNITY): Payer: Medicare HMO

## 2017-07-05 ENCOUNTER — Ambulatory Visit: Payer: Medicare HMO | Admitting: Family

## 2017-07-16 DIAGNOSIS — I10 Essential (primary) hypertension: Secondary | ICD-10-CM | POA: Diagnosis not present

## 2017-07-16 DIAGNOSIS — Z8673 Personal history of transient ischemic attack (TIA), and cerebral infarction without residual deficits: Secondary | ICD-10-CM | POA: Diagnosis not present

## 2017-07-16 DIAGNOSIS — Z6831 Body mass index (BMI) 31.0-31.9, adult: Secondary | ICD-10-CM | POA: Diagnosis not present

## 2017-07-16 DIAGNOSIS — Z1389 Encounter for screening for other disorder: Secondary | ICD-10-CM | POA: Diagnosis not present

## 2017-07-16 DIAGNOSIS — E782 Mixed hyperlipidemia: Secondary | ICD-10-CM | POA: Diagnosis not present

## 2017-07-16 DIAGNOSIS — I2510 Atherosclerotic heart disease of native coronary artery without angina pectoris: Secondary | ICD-10-CM | POA: Diagnosis not present

## 2017-07-16 DIAGNOSIS — R7309 Other abnormal glucose: Secondary | ICD-10-CM | POA: Diagnosis not present

## 2017-07-30 ENCOUNTER — Inpatient Hospital Stay (HOSPITAL_COMMUNITY): Admission: RE | Admit: 2017-07-30 | Payer: Medicare HMO | Source: Ambulatory Visit

## 2017-07-30 ENCOUNTER — Ambulatory Visit: Payer: Medicare HMO | Admitting: Family

## 2017-09-02 ENCOUNTER — Emergency Department (HOSPITAL_COMMUNITY): Payer: Medicare HMO

## 2017-09-02 ENCOUNTER — Observation Stay (HOSPITAL_COMMUNITY)
Admission: EM | Admit: 2017-09-02 | Discharge: 2017-09-03 | Disposition: A | Discharge status: Home / Self Care | Payer: Medicare HMO | Attending: Internal Medicine | Admitting: Internal Medicine

## 2017-09-02 ENCOUNTER — Other Ambulatory Visit: Payer: Self-pay

## 2017-09-02 ENCOUNTER — Encounter (HOSPITAL_COMMUNITY): Payer: Self-pay | Admitting: *Deleted

## 2017-09-02 DIAGNOSIS — I25708 Atherosclerosis of coronary artery bypass graft(s), unspecified, with other forms of angina pectoris: Secondary | ICD-10-CM | POA: Diagnosis not present

## 2017-09-02 DIAGNOSIS — I2510 Atherosclerotic heart disease of native coronary artery without angina pectoris: Secondary | ICD-10-CM | POA: Diagnosis present

## 2017-09-02 DIAGNOSIS — Z9049 Acquired absence of other specified parts of digestive tract: Secondary | ICD-10-CM | POA: Insufficient documentation

## 2017-09-02 DIAGNOSIS — Z79899 Other long term (current) drug therapy: Secondary | ICD-10-CM | POA: Insufficient documentation

## 2017-09-02 DIAGNOSIS — R079 Chest pain, unspecified: Secondary | ICD-10-CM

## 2017-09-02 DIAGNOSIS — R4781 Slurred speech: Secondary | ICD-10-CM | POA: Insufficient documentation

## 2017-09-02 DIAGNOSIS — I252 Old myocardial infarction: Secondary | ICD-10-CM | POA: Insufficient documentation

## 2017-09-02 DIAGNOSIS — R42 Dizziness and giddiness: Secondary | ICD-10-CM | POA: Diagnosis not present

## 2017-09-02 DIAGNOSIS — Z7982 Long term (current) use of aspirin: Secondary | ICD-10-CM | POA: Diagnosis not present

## 2017-09-02 DIAGNOSIS — Z8673 Personal history of transient ischemic attack (TIA), and cerebral infarction without residual deficits: Secondary | ICD-10-CM | POA: Diagnosis not present

## 2017-09-02 DIAGNOSIS — R0789 Other chest pain: Principal | ICD-10-CM | POA: Diagnosis present

## 2017-09-02 DIAGNOSIS — F419 Anxiety disorder, unspecified: Secondary | ICD-10-CM | POA: Diagnosis not present

## 2017-09-02 DIAGNOSIS — I10 Essential (primary) hypertension: Secondary | ICD-10-CM | POA: Diagnosis not present

## 2017-09-02 DIAGNOSIS — Z951 Presence of aortocoronary bypass graft: Secondary | ICD-10-CM | POA: Diagnosis not present

## 2017-09-02 LAB — URINALYSIS, ROUTINE W REFLEX MICROSCOPIC
Bilirubin Urine: NEGATIVE
GLUCOSE, UA: NEGATIVE mg/dL
HGB URINE DIPSTICK: NEGATIVE
Ketones, ur: NEGATIVE mg/dL
Leukocytes, UA: NEGATIVE
Nitrite: NEGATIVE
PROTEIN: NEGATIVE mg/dL
Specific Gravity, Urine: 1.003 — ABNORMAL LOW (ref 1.005–1.030)
pH: 7 (ref 5.0–8.0)

## 2017-09-02 LAB — TROPONIN I: Troponin I: <0.03 ng/mL (ref <0.03)

## 2017-09-02 LAB — CBC
HCT: 42.7 % (ref 36.0–46.0)
HEMOGLOBIN: 14.6 g/dL (ref 12.0–15.0)
MCH: 31.1 pg (ref 26.0–34.0)
MCHC: 34.2 g/dL (ref 30.0–36.0)
MCV: 90.9 fL (ref 78.0–100.0)
Platelets: 214 10*3/uL (ref 150–400)
RBC: 4.7 MIL/uL (ref 3.87–5.11)
RDW: 12.4 % (ref 11.5–15.5)
WBC: 5.6 10*3/uL (ref 4.0–10.5)

## 2017-09-02 LAB — BASIC METABOLIC PANEL
ANION GAP: 10 (ref 5–15)
BUN: 10 mg/dL (ref 6–20)
CALCIUM: 9.7 mg/dL (ref 8.9–10.3)
CO2: 30 mmol/L (ref 22–32)
Chloride: 94 mmol/L — ABNORMAL LOW (ref 101–111)
Creatinine, Ser: 0.6 mg/dL (ref 0.44–1.00)
GFR calc Af Amer: >60 mL/min (ref >60)
GFR calc non Af Amer: >60 mL/min (ref >60)
GLUCOSE: 99 mg/dL (ref 65–99)
Potassium: 3.3 mmol/L — ABNORMAL LOW (ref 3.5–5.1)
Sodium: 134 mmol/L — ABNORMAL LOW (ref 135–145)

## 2017-09-02 MED ORDER — ALPRAZOLAM 0.25 MG PO TABS
0.2500 mg | ORAL_TABLET | Freq: Two times a day (BID) | ORAL | Status: DC
  Administered 2017-09-02 – 2017-09-03 (×2): 0.25 mg via ORAL
  Filled 2017-09-02 (×2): qty 1

## 2017-09-02 MED ORDER — AMLODIPINE BESYLATE 5 MG PO TABS
5.0000 mg | ORAL_TABLET | Freq: Every day | ORAL | Status: DC
  Administered 2017-09-03: 5 mg via ORAL
  Filled 2017-09-02: qty 1

## 2017-09-02 MED ORDER — ENOXAPARIN SODIUM 40 MG/0.4ML ~~LOC~~ SOLN
40.0000 mg | SUBCUTANEOUS | Status: DC
  Administered 2017-09-02: 40 mg via SUBCUTANEOUS
  Filled 2017-09-02: qty 0.4

## 2017-09-02 MED ORDER — ROSUVASTATIN CALCIUM 10 MG PO TABS
5.0000 mg | ORAL_TABLET | Freq: Every day | ORAL | Status: DC
  Administered 2017-09-02: 5 mg via ORAL
  Filled 2017-09-02: qty 1

## 2017-09-02 MED ORDER — ONDANSETRON HCL 4 MG/2ML IJ SOLN: 4.0000 mg | Freq: Four times a day (QID) | INTRAMUSCULAR | Status: DC | PRN

## 2017-09-02 MED ORDER — TIMOLOL MALEATE 0.5 % OP SOLN
1.0000 [drp] | Freq: Two times a day (BID) | OPHTHALMIC | Status: DC
  Administered 2017-09-02 – 2017-09-03 (×2): 1 [drp] via OPHTHALMIC
  Filled 2017-09-02: qty 5

## 2017-09-02 MED ORDER — POTASSIUM CHLORIDE CRYS ER 20 MEQ PO TBCR
20.0000 meq | EXTENDED_RELEASE_TABLET | Freq: Once | ORAL | Status: AC
  Administered 2017-09-02: 20 meq via ORAL
  Filled 2017-09-02: qty 1

## 2017-09-02 MED ORDER — LORATADINE 10 MG PO TABS
10.0000 mg | ORAL_TABLET | Freq: Every day | ORAL | Status: DC
  Filled 2017-09-02: qty 1

## 2017-09-02 MED ORDER — ASPIRIN EC 325 MG PO TBEC
325.0000 mg | DELAYED_RELEASE_TABLET | Freq: Every day | ORAL | Status: DC
  Administered 2017-09-03: 325 mg via ORAL
  Filled 2017-09-02: qty 1

## 2017-09-02 NOTE — ED Triage Notes (Signed)
Pt c/o dizziness, loss of balance, decreased energy, heart rate running low (50's) x 2 days. Pt c/o left sided chest tightness since yesterday. Denies SOB, n/v.

## 2017-09-02 NOTE — H&P (Signed)
TRH H&P    Patient Demographics:    Sheryl Carter, is a 77 y.o. female  MRN: [id number]  DOB - Jan 25, 1941  Admit Date - 09/02/2017  Referring MD/NP/PA: Dr. Estell Harpin  Outpatient Primary MD for the patient is Elfredia Nevins, MD  Patient coming from: Home  Chief complaint-chest pain    HPI:    Sheryl Carter  is a 77 y.o. female, with history of CAD status post CABG x4,  hypertension,hyperlipidemia, glaucoma, TIA came to hospital with chest pain for past 2 days.  Pain associated with exertion, substernal location.  At this time patient is chest pain-free.  She is also complaining of some numbness and left arm. She denies nausea vomiting or diarrhea. Denies shortness of breath. No dysuria, urgency or frequency of urination. Denies abdominal pain.  Initial troponin in the ED is normal.    Review of systems:      All other systems reviewed and are negative.   With Past History of the following :    Past Medical History:  Diagnosis Date  . Anxiety   . Blindness of left eye   . CAD (coronary artery disease)    a. s/p CABG in 05/2016 with LIMA-LAD, SVG-D2, SVG-OM, SVG-RCA  . Complication of anesthesia    has gas trapped in her body after  . DVT (deep venous thrombosis) (HCC)   . Ear problems   . Family history of adverse reaction to anesthesia    daughter has gas trapped in her body postop as well  . Glaucoma   . Hypercholesterolemia   . Hypertension   . Pre-diabetes    pt. told by [facility] staff that she is prediabetic, HgbA1c was 5.4  . Stroke (HCC)    having TIA's      Past Surgical History:  Procedure Laterality Date  . BREAST SURGERY Left    cyst- L breast  . CHOLECYSTECTOMY    . CORONARY ARTERY BYPASS GRAFT N/A 06/09/2016   Procedure: CORONARY ARTERY BYPASS GRAFTING (CABG) times 4 using the left internal mammary artery and bilateral thigh greater saphenous veins harvested  endoscopically. LIMA to LAD, SVG to DIAGONAL 2, SVG to OM, SVG to RCA;  Surgeon: Delight Ovens, MD;  Location: [facility] OR;  Service: Open Heart Surgery;  Laterality: N/A;  . ENDARTERECTOMY Left 05/29/2016   Procedure: LEFT CAROTID ENDARTERECTOMY WITH PATCH ANGIOPLASTY;  Surgeon: Larina Earthly, MD;  Location: [facility] OR;  Service: Vascular;  Laterality: Left;  . EYE SURGERY Right    cataract  . LEFT HEART CATH AND CORONARY ANGIOGRAPHY N/A 06/07/2016   Procedure: Left Heart Cath and Coronary Angiography;  Surgeon: Iran Ouch, MD;  Location: [facility] INVASIVE CV LAB;  Service: Cardiovascular;  Laterality: N/A;  . TEE WITHOUT CARDIOVERSION N/A 06/09/2016   Procedure: TRANSESOPHAGEAL ECHOCARDIOGRAM (TEE);  Surgeon: Delight Ovens, MD;  Location: [facility] OR;  Service: Open Heart Surgery;  Laterality: N/A;      Social History:      Social History   Tobacco Use  . Smoking status: Never Smoker  .  Smokeless tobacco: Never Used  Substance Use Topics  . Alcohol use: No       Family History :     Family History  Problem Relation Age of Onset  . Heart disease Sister   . Heart disease Son       Home Medications:   Prior to Admission medications   Medication Sig Start Date End Date Taking? Authorizing Provider  ALPRAZolam (XANAX) 0.25 MG tablet Take 0.25 mg by mouth 2 (two) times daily. *May take one additional dose at lunch as needed   Yes [provider]  amLODipine (NORVASC) 5 MG tablet Take 1 tablet (5 mg total) by mouth daily. 05/14/17 09/02/17 Yes Laqueta LindenKoneswaran, Suresh A, MD  aspirin EC 325 MG EC tablet Take 1 tablet (325 mg total) by mouth daily. 06/15/16  Yes Barrett, Erin R, PA-C  fexofenadine (ALLEGRA) 180 MG tablet Take 180 mg by mouth daily.   Yes [provider]  hydrochlorothiazide (HYDRODIURIL) 25 MG tablet Take 1 tablet (25 mg total) by mouth daily. 04/30/17 09/02/17 Yes Laqueta LindenKoneswaran, Suresh A, MD  Omega-3 Fatty Acids (FISH OIL) 1000 MG CAPS Take 1,000 mg by mouth daily.   Yes  [provider]  rosuvastatin (CRESTOR) 5 MG tablet TAKE 1 TABLET BY MOUTH ONCE A DAY. 03/14/17  Yes Jodelle GrossLawrence, Kathryn M, NP  timolol (BETIMOL) 0.5 % ophthalmic solution Place 1 drop into the right eye 2 (two) times daily.    Yes [provider]  vitamin B-12 (CYANOCOBALAMIN) 1000 MCG tablet Take 1,000 mcg by mouth at bedtime.    Yes [provider]     Allergies:     Allergies  Allergen Reactions  . Other Hives    Mycins-   Pt has Glaucoma in Right eye, Blind in Left eye .... PLEASE DO NOT GIVE ANYTHING TO PATIENT THAT MIGHT DESTROY VISION   . Prednisone Other (See Comments)    Altered mental status  . Statins Other (See Comments)    MYALGIAS Weakness, muscle aches, and pain  . Erythromycin     UNSPECIFIED REACTION   . Tylenol [Acetaminophen] Other (See Comments)    Hallucinations     Physical Exam:   Vitals  Blood pressure 119/71, pulse 66, temperature 98 F (36.7 C), temperature source Oral, resp. rate 18, height 4' 7.5" (1.41 m), weight 59 kg (130 lb), SpO2 97 %.  1.  General: Appears in no acute distress  2. Psychiatric:  Intact judgement and  insight, awake alert, oriented x 3.  3. Neurologic: No focal neurological deficits, all cranial nerves intact.Strength 5/5 all 4 extremities, sensation intact all 4 extremities, plantars down going.  4. Eyes :  anicteric sclerae, moist conjunctivae with no lid lag. PERRLA.  5. ENMT:  Oropharynx clear with moist mucous membranes and good dentition  6. Neck:  supple, no cervical lymphadenopathy appriciated, No thyromegaly  7. Respiratory : Normal respiratory effort, good air movement bilaterally,clear to  auscultation bilaterally  8. Cardiovascular : RRR, no gallops, rubs or murmurs, no leg edema  9. Gastrointestinal:  Positive bowel sounds, abdomen soft, non-tender to palpation,no hepatosplenomegaly, no rigidity or guarding       10. Skin:  No cyanosis, normal texture and turgor, no  rash, lesions or ulcers  11.Musculoskeletal:  Good muscle tone,  joints appear normal , no effusions,  normal range of motion    Data Review:    CBC Recent Labs  Lab 09/02/17 1351  WBC 5.6  HGB 14.6  HCT 42.7  PLT 214  MCV 90.9  MCH 31.1  MCHC 34.2  RDW 12.4   ------------------------------------------------------------------------------------------------------------------  Chemistries  Recent Labs  Lab 09/02/17 1351  NA 134*  K 3.3*  CL 94*  CO2 30  GLUCOSE 99  BUN 10  CREATININE 0.60  CALCIUM 9.7   ------------------------------------------------------------------------------------------------------------------  ------------------------------------------------------------------------------------------------------------------ GFR: Estimated Creatinine Clearance: 40.9 mL/min (by C-G formula based on SCr of 0.6 mg/dL). Liver Function Tests: No results for input(s): AST, ALT, ALKPHOS, BILITOT, PROT, ALBUMIN in the last 168 hours. No results for input(s): LIPASE, AMYLASE in the last 168 hours. No results for input(s): AMMONIA in the last 168 hours. Coagulation Profile: No results for input(s): INR, PROTIME in the last 168 hours. Cardiac Enzymes: Recent Labs  Lab 09/02/17 1351  TROPONINI <0.03   BNP (last 3 results) No results for input(s): PROBNP in the last 8760 hours. HbA1C: No results for input(s): HGBA1C in the last 72 hours. CBG: No results for input(s): GLUCAP in the last 168 hours. Lipid Profile: No results for input(s): CHOL, HDL, LDLCALC, TRIG, CHOLHDL, LDLDIRECT in the last 72 hours. Thyroid Function Tests: No results for input(s): TSH, T4TOTAL, FREET4, T3FREE, THYROIDAB in the last 72 hours. Anemia Panel: No results for input(s): VITAMINB12, FOLATE, FERRITIN, TIBC, IRON, RETICCTPCT in the last 72 hours.  --------------------------------------------------------------------------------------------------------------- Urine analysis:      Component Value Date/Time   COLORURINE STRAW (A) 09/02/2017 1610   APPEARANCEUR CLEAR 09/02/2017 1610   LABSPEC 1.003 (L) 09/02/2017 1610   PHURINE 7.0 09/02/2017 1610   GLUCOSEU NEGATIVE 09/02/2017 1610   HGBUR NEGATIVE 09/02/2017 1610   BILIRUBINUR NEGATIVE 09/02/2017 1610   KETONESUR NEGATIVE 09/02/2017 1610   PROTEINUR NEGATIVE 09/02/2017 1610   NITRITE NEGATIVE 09/02/2017 1610   LEUKOCYTESUR NEGATIVE 09/02/2017 1610      Imaging Results:    Dg Chest 2 View  Result Date: 09/02/2017 CLINICAL DATA:  Left-sided chest pain for 2 days. Dizziness. Coronary artery disease. EXAM: CHEST - 2 VIEW COMPARISON:  07/17/2016 FINDINGS: The heart size and mediastinal contours are within normal limits. Prior CABG again noted. Both lungs are clear. Thoracic spine degenerative changes again noted. IMPRESSION: No active cardiopulmonary disease. Electronically Signed   By: Myles Rosenthal M.D.   On: 09/02/2017 14:07    My personal review of EKG: Rhythm sinus bradycardia, no ST-T changes.   Assessment & Plan:    Active Problems:   Hypertension, essential   Coronary artery disease   S/P CABG x 4   Chest pain   1. Chest pain-placed under observation, obtain serial troponin every 6 hours x3, chest pain has resolved at this time. 2. CAD status post CABG x 4-stable, continue aspirin, Crestor 3. Hypertension-blood pressure stable, continue amlodipine, hold HCTZ as patient potassium was low. 4. Hypokalemia-potassium was 3.3, replace potassium with K. Dur 20 mg p.o. x1.  Follow BMP in am.    DVT Prophylaxis-   Lovenox   AM Labs Ordered, also please review Full Orders  Family Communication: Admission, patients condition and plan of care including tests being ordered have been discussed with the patient  and her daughter at bedside Dr. Estell Harpin who indicate understanding and agree with the plan and Code Status.  Code Status: DNR  Admission status: Observation   Time spent in minutes : 60  minutes   Meredeth Ide M.D on 09/02/2017 at 6:41 PM  Between 7am to 7pm - Pager - [phone]. After 7pm go to [email or web address] - password [facility]  [facility] - Office  [phone]

## 2017-09-02 NOTE — ED Provider Notes (Signed)
[facility] EMERGENCY DEPARTMENT Provider Note   CSN: [id number] Arrival date & time: 09/02/17  1253     History   Chief Complaint Chief Complaint  Patient presents with  . Dizziness  . Chest Pain    HPI Sheryl Carter is a 77 y.o. female.  Patient states that for the last couple days she has been having chest pain on and off.  She had 3 episodes yesterday and 2 today.  She also complains of some dizziness.  The patient has a history of coronary disease with bypass surgery last year  The history is provided by the patient. No language interpreter was used.  Chest Pain   This is a new problem. The current episode started yesterday. The problem occurs rarely. The problem has been resolved. The pain is associated with exertion. The pain is present in the substernal region. The pain is at a severity of 5/10. The pain is moderate. The quality of the pain is described as brief. The pain does not radiate. Pertinent negatives include no abdominal pain, no back pain, no cough and no headaches.  Pertinent negatives for past medical history include no seizures.    Past Medical History:  Diagnosis Date  . Anxiety   . Blindness of left eye   . CAD (coronary artery disease)    a. s/p CABG in 05/2016 with LIMA-LAD, SVG-D2, SVG-OM, SVG-RCA  . Complication of anesthesia    has gas trapped in her body after  . DVT (deep venous thrombosis) (HCC)   . Ear problems   . Family history of adverse reaction to anesthesia    daughter has gas trapped in her body postop as well  . Glaucoma   . Hypercholesterolemia   . Hypertension   . Pre-diabetes    pt. told by [facility] staff that she is prediabetic, HgbA1c was 5.4  . Stroke [facility])    having TIA's    Patient Active Problem List   Diagnosis Date Noted  . Constipation 06/26/2016  . 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 Coronary artery disease   . NSTEMI (non-ST elevated myocardial infarction) (HCC) 06/07/2016  . Paresthesia   . TIA (transient ischemic  attack) 06/04/2016  . Symptomatic carotid artery stenosis, left 05/29/2016  . Slurred speech 04/18/2016  . Hypertension, essential 04/18/2016  . Hyperlipidemia 04/18/2016  . Chronic anxiety 04/18/2016    Past Surgical History:  Procedure Laterality Date  . BREAST SURGERY Left    cyst- L breast  . CHOLECYSTECTOMY    . CORONARY ARTERY BYPASS GRAFT N/A 06/09/2016   Procedure: CORONARY ARTERY BYPASS GRAFTING (CABG) times 4 using the left internal mammary artery and bilateral thigh greater saphenous veins harvested endoscopically. LIMA to LAD, SVG to DIAGONAL 2, SVG to OM, SVG to RCA;  Surgeon: Delight Ovens, MD;  Location: [facility] OR;  Service: Open Heart Surgery;  Laterality: N/A;  . ENDARTERECTOMY Left 05/29/2016   Procedure: LEFT CAROTID ENDARTERECTOMY WITH PATCH ANGIOPLASTY;  Surgeon: Larina Earthly, MD;  Location: [facility] OR;  Service: Vascular;  Laterality: Left;  . EYE SURGERY Right    cataract  . LEFT HEART CATH AND CORONARY ANGIOGRAPHY N/A 06/07/2016   Procedure: Left Heart Cath and Coronary Angiography;  Surgeon: Iran Ouch, MD;  Location: [facility] INVASIVE CV LAB;  Service: Cardiovascular;  Laterality: N/A;  . TEE WITHOUT CARDIOVERSION N/A 06/09/2016   Procedure: TRANSESOPHAGEAL ECHOCARDIOGRAM (TEE);  Surgeon: Delight Ovens, MD;  Location: [facility] OR;  Service: Open Heart Surgery;  Laterality:  N/A;     OB History   None      Home Medications    Prior to Admission medications   Medication Sig Start Date End Date Taking? Authorizing Provider  ALPRAZolam (XANAX) 0.25 MG tablet Take 0.25 mg by mouth 2 (two) times daily.    [provider]  amLODipine (NORVASC) 5 MG tablet Take 1 tablet (5 mg total) by mouth daily. 05/14/17 08/12/17  Laqueta LindenKoneswaran, Suresh A, MD  aspirin EC 325 MG EC tablet Take 1 tablet (325 mg total) by mouth daily. 06/15/16   Barrett, Erin R, PA-C  esomeprazole (NEXIUM 24HR) 20 MG capsule Take 1 capsule (20 mg total) by mouth at bedtime. 06/30/16   Jodelle GrossLawrence, Kathryn M,  NP  fexofenadine (ALLEGRA) 180 MG tablet Take 180 mg by mouth daily.    [provider]  hydrochlorothiazide (HYDRODIURIL) 25 MG tablet Take 1 tablet (25 mg total) by mouth daily. 04/30/17 07/29/17  Laqueta LindenKoneswaran, Suresh A, MD  Omega-3 Fatty Acids (FISH OIL) 1000 MG CAPS Take 1,000 mg by mouth daily.    [provider]  rosuvastatin (CRESTOR) 5 MG tablet TAKE 1 TABLET BY MOUTH ONCE A DAY. 03/14/17   Jodelle GrossLawrence, Kathryn M, NP  timolol (BETIMOL) 0.5 % ophthalmic solution Place 1 drop into the right eye 2 (two) times daily.     [provider]  vitamin B-12 (CYANOCOBALAMIN) 1000 MCG tablet Take 1,000 mcg by mouth at bedtime.     [provider]    Family History Family History  Problem Relation Age of Onset  . Heart disease Sister   . Heart disease Son     Social History Social History   Tobacco Use  . Smoking status: Never Smoker  . Smokeless tobacco: Never Used  Substance Use Topics  . Alcohol use: No  . Drug use: No     Allergies   Other; Prednisone; Statins; Erythromycin; and Tylenol [acetaminophen]   Review of Systems Review of Systems  Constitutional: Negative for appetite change and fatigue.  HENT: Negative for congestion, ear discharge and sinus pressure.   Eyes: Negative for discharge.  Respiratory: Negative for cough.   Cardiovascular: Positive for chest pain.  Gastrointestinal: Negative for abdominal pain and diarrhea.  Genitourinary: Negative for frequency and hematuria.  Musculoskeletal: Negative for back pain.  Skin: Negative for rash.  Neurological: Negative for seizures and headaches.  Psychiatric/Behavioral: Negative for hallucinations.     Physical Exam Updated Vital Signs BP (!) 142/80 (BP Location: Left Arm)   Pulse 74   Temp 98 F (36.7 C) (Oral)   Resp 15   Ht 4' 7.5" (1.41 m)   Wt 59 kg (130 lb)   SpO2 99%   BMI 29.67 kg/m   Physical Exam  Constitutional: She is oriented to person, place, and time. She  appears well-developed.  HENT:  Head: Normocephalic.  Eyes: Conjunctivae and EOM are normal. No scleral icterus.  Neck: Neck supple. No thyromegaly present.  Cardiovascular: Normal rate and regular rhythm. Exam reveals no gallop and no friction rub.  No murmur heard. Pulmonary/Chest: No stridor. She has no wheezes. She has no rales. She exhibits no tenderness.  Abdominal: She exhibits no distension. There is no tenderness. There is no rebound.  Musculoskeletal: Normal range of motion. She exhibits no edema.  Lymphadenopathy:    She has no cervical adenopathy.  Neurological: She is oriented to person, place, and time. She exhibits normal muscle tone. Coordination normal.  Skin: No rash noted. No erythema.  Psychiatric: She has a normal mood and affect. Her behavior is normal.     ED Treatments / Results  Labs (all labs ordered are listed, but only abnormal results are displayed) Labs Reviewed  BASIC METABOLIC PANEL - Abnormal; Notable for the following components:      Result Value   Sodium 134 (*)    Potassium 3.3 (*)    Chloride 94 (*)    All other components within normal limits  URINALYSIS, ROUTINE W REFLEX MICROSCOPIC - Abnormal; Notable for the following components:   Color, Urine STRAW (*)    Specific Gravity, Urine 1.003 (*)    All other components within normal limits  CBC  TROPONIN I    EKG EKG Interpretation  Date/Time:  Sunday September 02 2017 13:14:26 EDT Ventricular Rate:  58 PR Interval:  190 QRS Duration: 94 QT Interval:  438 QTC Calculation: 429 R Axis:   42 Text Interpretation:  Sinus bradycardia Otherwise normal ECG Confirmed by Bethann Berkshire [phone]) on 09/02/2017 3:28:47 PM   Radiology Dg Chest 2 View  Result Date: 09/02/2017 CLINICAL DATA:  Left-sided chest pain for 2 days. Dizziness. Coronary artery disease. EXAM: CHEST - 2 VIEW COMPARISON:  07/17/2016 FINDINGS: The heart size and mediastinal contours are within normal limits. Prior CABG again noted.  Both lungs are clear. Thoracic spine degenerative changes again noted. IMPRESSION: No active cardiopulmonary disease. Electronically Signed   By: Myles Rosenthal M.D.   On: 09/02/2017 14:07    Procedures Procedures (including critical care time)  Medications Ordered in ED Medications - No data to display   Initial Impression / Assessment and Plan / ED Course  I have reviewed the triage vital signs and the nursing notes.  Pertinent labs & imaging results that were available during my care of the patient were reviewed by me and considered in my medical decision making (see chart for details).     Labs including CBC chemistries troponin EKG chest x-ray are unremarkable.  Patient will be admitted for observation for recycling enzymes and possibly cardiology consult tomorrow  Final Clinical Impressions(s) / ED Diagnoses   Final diagnoses:  Atypical chest pain    ED Discharge Orders    None       Bethann Berkshire, MD 09/02/17 1701

## 2017-09-03 ENCOUNTER — Encounter (HOSPITAL_COMMUNITY): Payer: Self-pay | Admitting: *Deleted

## 2017-09-03 DIAGNOSIS — Z951 Presence of aortocoronary bypass graft: Secondary | ICD-10-CM | POA: Diagnosis not present

## 2017-09-03 DIAGNOSIS — R0789 Other chest pain: Secondary | ICD-10-CM | POA: Diagnosis not present

## 2017-09-03 DIAGNOSIS — I10 Essential (primary) hypertension: Secondary | ICD-10-CM

## 2017-09-03 LAB — BASIC METABOLIC PANEL WITH GFR
Anion gap: 8 (ref 5–15)
BUN: 9 mg/dL (ref 6–20)
CO2: 31 mmol/L (ref 22–32)
Calcium: 9.5 mg/dL (ref 8.9–10.3)
Chloride: 99 mmol/L — ABNORMAL LOW (ref 101–111)
Creatinine, Ser: 0.63 mg/dL (ref 0.44–1.00)
GFR calc Af Amer: >60 mL/min
GFR calc non Af Amer: >60 mL/min
Glucose, Bld: 105 mg/dL — ABNORMAL HIGH (ref 65–99)
Potassium: 3.7 mmol/L (ref 3.5–5.1)
Sodium: 138 mmol/L (ref 135–145)

## 2017-09-03 LAB — TROPONIN I
Troponin I: <0.03 ng/mL
Troponin I: <0.03 ng/mL

## 2017-09-03 MED ORDER — ISOSORBIDE MONONITRATE ER 30 MG PO TB24: 15.0000 mg | ORAL_TABLET | Freq: Every day | ORAL | 11 refills | Status: DC

## 2017-09-03 MED ORDER — ISOSORBIDE MONONITRATE ER 30 MG PO TB24
15.0000 mg | ORAL_TABLET | Freq: Every day | ORAL | Status: DC
  Administered 2017-09-03: 15 mg via ORAL
  Filled 2017-09-03: qty 1

## 2017-09-03 NOTE — Discharge Summary (Signed)
Physician Discharge Summary  Sheryl ChanceShirley M Balli RJJ:[id number] DOB: 05/11/1940 DOA: 09/02/2017  PCP: Elfredia NevinsFusco, Lawrence, MD  Admit date: 09/02/2017 Discharge date: 09/03/2017  Admitted From: Home Disposition: Home  Recommendations for Outpatient Follow-up:  1. Follow up with PCP in 1-2 weeks 2. Please obtain BMP/CBC in one week 3. Follow-up is been arranged with cardiology on 7/2  Discharge Condition: Stable CODE STATUS: DNR Diet recommendation: Heart healthy  Brief/Interim Summary: 77 year-old female admitted to the hospital with complaints of chest discomfort.  She was admitted to telemetry.  EKG did not show any acute findings.  She ruled out for ACS with negative cardiac markers.  Since admission, she has not had any recurrence of symptoms.  Due to this she does have a history of coronary artery disease and follows with a cardiologist at any pain.  Since she does not have any recurrence of symptoms, will start her on low-dose Imdur.  She is already on calcium channel blockers for further antianginal effect.  Follow-up with cardiology has been set up.  Patient was ambulated and did not have any recurrence of pain.  She is felt stable to discharge home.  Discharge Diagnoses:  Active Problems:   Hypertension, essential   Coronary artery disease   S/P CABG x 4   Chest pain    Discharge Instructions  Discharge Instructions    Diet - low sodium heart healthy   Complete by:  As directed    Increase activity slowly   Complete by:  As directed      Allergies as of 09/03/2017      Reactions   Other Hives   Mycins-  Pt has Glaucoma in Right eye, Blind in Left eye .... PLEASE DO NOT GIVE ANYTHING TO PATIENT THAT MIGHT DESTROY VISION    Prednisone Other (See Comments)   Altered mental status   Statins Other (See Comments)   MYALGIAS Weakness, muscle aches, and pain   Erythromycin    UNSPECIFIED REACTION    Tylenol [acetaminophen] Other (See Comments)   Hallucinations       Medication List    STOP taking these medications   hydrochlorothiazide 25 MG tablet Commonly known as:  HYDRODIURIL     TAKE these medications   ALPRAZolam 0.25 MG tablet Commonly known as:  XANAX Take 0.25 mg by mouth 2 (two) times daily. *May take one additional dose at lunch as needed   amLODipine 5 MG tablet Commonly known as:  NORVASC Take 1 tablet (5 mg total) by mouth daily.   aspirin 325 MG EC tablet Take 1 tablet (325 mg total) by mouth daily.   fexofenadine 180 MG tablet Commonly known as:  ALLEGRA Take 180 mg by mouth daily.   Fish Oil 1000 MG Caps Take 1,000 mg by mouth daily.   isosorbide mononitrate 30 MG 24 hr tablet Commonly known as:  IMDUR Take 0.5 tablets (15 mg total) by mouth daily.   rosuvastatin 5 MG tablet Commonly known as:  CRESTOR TAKE 1 TABLET BY MOUTH ONCE A DAY.   timolol 0.5 % ophthalmic solution Commonly known as:  BETIMOL Place 1 drop into the right eye 2 (two) times daily.   vitamin B-12 1000 MCG tablet Commonly known as:  CYANOCOBALAMIN Take 1,000 mcg by mouth at bedtime.      Follow-up Information    Ellsworth LennoxStrader, Brittany M, PA-C Follow up on 09/25/2017.   Specialties:  Physician Assistant, Cardiology Why:  2:00pm Contact information: [address] [place] [address] [phone]  Allergies  Allergen Reactions  . Other Hives    Mycins-   Pt has Glaucoma in Right eye, Blind in Left eye .... PLEASE DO NOT GIVE ANYTHING TO PATIENT THAT MIGHT DESTROY VISION   . Prednisone Other (See Comments)    Altered mental status  . Statins Other (See Comments)    MYALGIAS Weakness, muscle aches, and pain  . Erythromycin     UNSPECIFIED REACTION   . Tylenol [Acetaminophen] Other (See Comments)    Hallucinations    Consultations:     Procedures/Studies: Dg Chest 2 View  Result Date: 09/02/2017 CLINICAL DATA:  Left-sided chest pain for 2 days. Dizziness. Coronary artery disease. EXAM: CHEST - 2 VIEW COMPARISON:   07/17/2016 FINDINGS: The heart size and mediastinal contours are within normal limits. Prior CABG again noted. Both lungs are clear. Thoracic spine degenerative changes again noted. IMPRESSION: No active cardiopulmonary disease. Electronically Signed   By: Myles Rosenthal M.D.   On: 09/02/2017 14:07       Subjective: Feels better.  No further complaints of chest pain or shortness of breath.  Able to ambulate without difficulty.  Discharge Exam: Vitals:   09/03/17 0548 09/03/17 1416  BP: (!) 116/53 119/76  Pulse: (!) 57 (!) 56  Resp: 18 20  Temp: 98.2 F (36.8 C) 98.3 F (36.8 C)  SpO2: 95% 98%   Vitals:   09/02/17 1930 09/02/17 2000 09/03/17 0548 09/03/17 1416  BP: (!) 120/53 121/73 (!) 116/53 119/76  Pulse: (!) 57 (!) 57 (!) 57 (!) 56  Resp: 14  18 20   Temp:  97.6 F (36.4 C) 98.2 F (36.8 C) 98.3 F (36.8 C)  TempSrc:  Oral Oral Oral  SpO2: 100% 98% 95% 98%  Weight:  59.7 kg (131 lb 9.8 oz)    Height:  4\' 7"  (1.397 m)      General: Pt is alert, awake, not in acute distress Cardiovascular: RRR, S1/S2 +, no rubs, no gallops Respiratory: CTA bilaterally, no wheezing, no rhonchi Abdominal: Soft, NT, ND, bowel sounds + Extremities: no edema, no cyanosis    The results of significant diagnostics from this hospitalization (including imaging, microbiology, ancillary and laboratory) are listed below for reference.     Microbiology: No results found for this or any previous visit (from the past 240 hour(s)).   Labs: BNP (last 3 results) No results for input(s): BNP in the last 8760 hours. Basic Metabolic Panel: Recent Labs  Lab 09/02/17 1351 09/03/17 0335  NA 134* 138  K 3.3* 3.7  CL 94* 99*  CO2 30 31  GLUCOSE 99 105*  BUN 10 9  CREATININE 0.60 0.63  CALCIUM 9.7 9.5   Liver Function Tests: No results for input(s): AST, ALT, ALKPHOS, BILITOT, PROT, ALBUMIN in the last 168 hours. No results for input(s): LIPASE, AMYLASE in the last 168 hours. No results for  input(s): AMMONIA in the last 168 hours. CBC: Recent Labs  Lab 09/02/17 1351  WBC 5.6  HGB 14.6  HCT 42.7  MCV 90.9  PLT 214   Cardiac Enzymes: Recent Labs  Lab 09/02/17 1351 09/02/17 2122 09/03/17 0335 09/03/17 0918  TROPONINI <0.03 <0.03 <0.03 <0.03   BNP: Invalid input(s): POCBNP CBG: No results for input(s): GLUCAP in the last 168 hours. D-Dimer No results for input(s): DDIMER in the last 72 hours. Hgb A1c No results for input(s): HGBA1C in the last 72 hours. Lipid Profile No results for input(s): CHOL, HDL, LDLCALC, TRIG, CHOLHDL, LDLDIRECT in the last 72 hours.  Thyroid function studies No results for input(s): TSH, T4TOTAL, T3FREE, THYROIDAB in the last 72 hours.  Invalid input(s): FREET3 Anemia work up No results for input(s): VITAMINB12, FOLATE, FERRITIN, TIBC, IRON, RETICCTPCT in the last 72 hours. Urinalysis    Component Value Date/Time   COLORURINE STRAW (A) 09/02/2017 1610   APPEARANCEUR CLEAR 09/02/2017 1610   LABSPEC 1.003 (L) 09/02/2017 1610   PHURINE 7.0 09/02/2017 1610   GLUCOSEU NEGATIVE 09/02/2017 1610   HGBUR NEGATIVE 09/02/2017 1610   BILIRUBINUR NEGATIVE 09/02/2017 1610   KETONESUR NEGATIVE 09/02/2017 1610   PROTEINUR NEGATIVE 09/02/2017 1610   NITRITE NEGATIVE 09/02/2017 1610   LEUKOCYTESUR NEGATIVE 09/02/2017 1610   Sepsis Labs Invalid input(s): PROCALCITONIN,  WBC,  LACTICIDVEN Microbiology No results found for this or any previous visit (from the past 240 hour(s)).   Time coordinating discharge:  SIGNED:   Erick Blinks, MD  [facility] 09/03/2017, 7:43 PM Pager   If 7PM-7AM, please contact night-coverage [email or web address] Password [email or web address]

## 2017-09-03 NOTE — Progress Notes (Signed)
Patient discharged to home. Pt son and daughter came to pick her up. Patient verbalized understanding of AVS and medication change as demonstrated by teach back. Patient anxious about medication change. This nurse reassured pt. Pt under no distress and had steady gait from bed to wheelchair and wheelchair to car.

## 2017-09-14 DIAGNOSIS — K219 Gastro-esophageal reflux disease without esophagitis: Secondary | ICD-10-CM | POA: Diagnosis not present

## 2017-09-14 DIAGNOSIS — R079 Chest pain, unspecified: Secondary | ICD-10-CM | POA: Diagnosis not present

## 2017-09-14 DIAGNOSIS — I2510 Atherosclerotic heart disease of native coronary artery without angina pectoris: Secondary | ICD-10-CM | POA: Diagnosis not present

## 2017-09-14 DIAGNOSIS — I10 Essential (primary) hypertension: Secondary | ICD-10-CM | POA: Diagnosis not present

## 2017-09-14 DIAGNOSIS — Z6830 Body mass index (BMI) 30.0-30.9, adult: Secondary | ICD-10-CM | POA: Diagnosis not present

## 2017-09-14 DIAGNOSIS — R7309 Other abnormal glucose: Secondary | ICD-10-CM | POA: Diagnosis not present

## 2017-09-14 DIAGNOSIS — Z1389 Encounter for screening for other disorder: Secondary | ICD-10-CM | POA: Diagnosis not present

## 2017-09-14 DIAGNOSIS — E782 Mixed hyperlipidemia: Secondary | ICD-10-CM | POA: Diagnosis not present

## 2017-09-25 ENCOUNTER — Ambulatory Visit: Payer: Medicare HMO | Admitting: Student

## 2017-09-25 ENCOUNTER — Encounter: Payer: Self-pay | Admitting: Student

## 2017-09-25 VITALS — BP 132/76 | HR 56 | Ht <= 58 in | Wt 137.0 lb

## 2017-09-25 DIAGNOSIS — R600 Localized edema: Secondary | ICD-10-CM | POA: Diagnosis not present

## 2017-09-25 DIAGNOSIS — E876 Hypokalemia: Secondary | ICD-10-CM | POA: Diagnosis not present

## 2017-09-25 DIAGNOSIS — I25708 Atherosclerosis of coronary artery bypass graft(s), unspecified, with other forms of angina pectoris: Secondary | ICD-10-CM

## 2017-09-25 DIAGNOSIS — R001 Bradycardia, unspecified: Secondary | ICD-10-CM | POA: Diagnosis not present

## 2017-09-25 DIAGNOSIS — I10 Essential (primary) hypertension: Secondary | ICD-10-CM

## 2017-09-25 DIAGNOSIS — E785 Hyperlipidemia, unspecified: Secondary | ICD-10-CM | POA: Diagnosis not present

## 2017-09-25 MED ORDER — ASPIRIN EC 81 MG PO TBEC: 81.0000 mg | DELAYED_RELEASE_TABLET | Freq: Every day | ORAL | Status: DC

## 2017-09-25 MED ORDER — NITROGLYCERIN 0.4 MG SL SUBL: 0.4000 mg | SUBLINGUAL_TABLET | SUBLINGUAL | 3 refills | Status: DC | PRN

## 2017-09-25 MED ORDER — ASPIRIN EC 81 MG PO TBEC: 325.0000 mg | DELAYED_RELEASE_TABLET | Freq: Every day | ORAL | Status: DC

## 2017-09-25 MED ORDER — HYDROCHLOROTHIAZIDE 12.5 MG PO CAPS: 12.5000 mg | ORAL_CAPSULE | Freq: Every day | ORAL | 3 refills | Status: DC

## 2017-09-25 NOTE — Patient Instructions (Addendum)
Medication Instructions:  Your physician has recommended you make the following change in your medication:  Start HCTZ 12.5 Daily  Nitro    Labwork: Your physician recommends that you return for lab work in: 2 Weeks     Testing/Procedures: NONE   Follow-Up: Your physician recommends that you schedule a follow-up appointment in: 4 Months    Any Other Special Instructions Will Be Listed Below (If Applicable).     If you need a refill on your cardiac medications before your next appointment, please call your pharmacy.   Potassium Content of Foods Potassium is a mineral found in many foods and drinks. It helps keep fluids and minerals balanced in your body and affects how steadily your heart beats. Potassium also helps control your blood pressure and keep your muscles and nervous system healthy. Certain health conditions and medicines may change the balance of potassium in your body. When this happens, you can help balance your level of potassium through the foods that you do or do not eat. Your health care provider or dietitian may recommend an amount of potassium that you should have each day. The following lists of foods provide the amount of potassium (in parentheses) per serving in each item. High in potassium The following foods and beverages have 200 mg or more of potassium per serving:  Apricots, 2 raw or 5 dry (200 mg).  Artichoke, 1 medium (345 mg).  Avocado, raw,  each (245 mg).  Banana, 1 medium (425 mg).  Beans, lima, or baked beans, canned,  cup (280 mg).  Beans, white, canned,  cup (595 mg).  Beef roast, 3 oz (320 mg).  Beef, ground, 3 oz (270 mg).  Beets, raw or cooked,  cup (260 mg).  Bran muffin, 2 oz (300 mg).  Broccoli,  cup (230 mg).  Brussels sprouts,  cup (250 mg).  Cantaloupe,  cup (215 mg).  Cereal, 100% bran,  cup (200-400 mg).  Cheeseburger, single, fast food, 1 each (225-400 mg).  Chicken, 3 oz (220 mg).  Clams, canned, 3  oz (535 mg).  Crab, 3 oz (225 mg).  Dates, 5 each (270 mg).  Dried beans and peas,  cup (300-475 mg).  Figs, dried, 2 each (260 mg).  Fish: halibut, tuna, cod, snapper, 3 oz (480 mg).  Fish: salmon, haddock, swordfish, perch, 3 oz (300 mg).  Fish, tuna, canned 3 oz (200 mg).  Jamaica[place] fries, fast food, 3 oz (470 mg).  Granola with fruit and nuts,  cup (200 mg).  Grapefruit juice,  cup (200 mg).  Greens, beet,  cup (655 mg).  Honeydew melon,  cup (200 mg).  Kale, raw, 1 cup (300 mg).  Kiwi, 1 medium (240 mg).  Kohlrabi, rutabaga, parsnips,  cup (280 mg).  Lentils,  cup (365 mg).  Mango, 1 each (325 mg).  Milk, chocolate, 1 cup (420 mg).  Milk: nonfat, low-fat, whole, buttermilk, 1 cup (350-380 mg).  Molasses, 1 Tbsp (295 mg).  Mushrooms,  cup (280) mg.  Nectarine, 1 each (275 mg).  Nuts: almonds, peanuts, hazelnuts, [place], cashew, mixed, 1 oz (200 mg).  Nuts, pistachios, 1 oz (295 mg).  Orange, 1 each (240 mg).  Orange juice,  cup (235 mg).  Papaya, medium,  fruit (390 mg).  Peanut butter, chunky, 2 Tbsp (240 mg).  Peanut butter, smooth, 2 Tbsp (210 mg).  Pear, 1 medium (200 mg).  Pomegranate, 1 whole (400 mg).  Pomegranate juice,  cup (215 mg).  Pork, 3 oz (350 mg).  Potato chips, salted, 1 oz (465 mg).  Potato, baked with skin, 1 medium (925 mg).  Potatoes, boiled,  cup (255 mg).  Potatoes, mashed,  cup (330 mg).  Prune juice,  cup (370 mg).  Prunes, 5 each (305 mg).  Pudding, chocolate,  cup (230 mg).  Pumpkin, canned,  cup (250 mg).  Raisins, seedless,  cup (270 mg).  Seeds, sunflower or pumpkin, 1 oz (240 mg).  Soy milk, 1 cup (300 mg).  Spinach,  cup (420 mg).  Spinach, canned,  cup (370 mg).  Sweet potato, baked with skin, 1 medium (450 mg).  Swiss chard,  cup (480 mg).  Tomato or vegetable juice,  cup (275 mg).  Tomato sauce or puree,  cup (400-550 mg).  Tomato, raw, 1 medium (290  mg).  Tomatoes, canned,  cup (200-300 mg).  [place], 3 oz (250 mg).  Wheat germ, 1 oz (250 mg).  Winter squash,  cup (250 mg).  Yogurt, plain or fruited, 6 oz (260-435 mg).  Zucchini,  cup (220 mg).  Moderate in potassium The following foods and beverages have 50-200 mg of potassium per serving:  Apple, 1 each (150 mg).  Apple juice,  cup (150 mg).  Applesauce,  cup (90 mg).  Apricot nectar,  cup (140 mg).  Asparagus, small spears,  cup or 6 spears (155 mg).  Bagel, cinnamon raisin, 1 each (130 mg).  Bagel, egg or plain, 4 in., 1 each (70 mg).  Beans, green,  cup (90 mg).  Beans, yellow,  cup (190 mg).  Beer, regular, 12 oz (100 mg).  Beets, canned,  cup (125 mg).  Blackberries,  cup (115 mg).  Blueberries,  cup (60 mg).  Bread, whole wheat, 1 slice (70 mg).  Broccoli, raw,  cup (145 mg).  Cabbage,  cup (150 mg).  Carrots, cooked or raw,  cup (180 mg).  Cauliflower, raw,  cup (150 mg).  Celery, raw,  cup (155 mg).  Cereal, bran flakes, cup (120-150 mg).  Cheese, cottage,  cup (110 mg).  Cherries, 10 each (150 mg).  Chocolate, 1 oz bar (165 mg).  Coffee, brewed 6 oz (90 mg).  Corn,  cup or 1 ear (195 mg).  Cucumbers,  cup (80 mg).  Egg, large, 1 each (60 mg).  Eggplant,  cup (60 mg).  Endive, raw, cup (80 mg).  English muffin, 1 each (65 mg).  Fish, orange roughy, 3 oz (150 mg).  Frankfurter, beef or pork, 1 each (75 mg).  Fruit cocktail,  cup (115 mg).  Grape juice,  cup (170 mg).  Grapefruit,  fruit (175 mg).  Grapes,  cup (155 mg).  Greens: kale, turnip, collard,  cup (110-150 mg).  Ice cream or frozen yogurt, chocolate,  cup (175 mg).  Ice cream or frozen yogurt, vanilla,  cup (120-150 mg).  Lemons, limes, 1 each (80 mg).  Lettuce, all types, 1 cup (100 mg).  Mixed vegetables,  cup (150 mg).  Mushrooms, raw,  cup (110 mg).  Nuts: walnuts, pecans, or macadamia, 1 oz (125  mg).  Oatmeal,  cup (80 mg).  Okra,  cup (110 mg).  Onions, raw,  cup (120 mg).  Peach, 1 each (185 mg).  Peaches, canned,  cup (120 mg).  Pears, canned,  cup (120 mg).  Peas, green, frozen,  cup (90 mg).  Peppers, green,  cup (130 mg).  Peppers, red,  cup (160 mg).  Pineapple juice,  cup (165 mg).  Pineapple, fresh or canned,  cup (100 mg).  Plums, 1 each (105 mg).  Pudding, vanilla,  cup (150 mg).  Raspberries,  cup (90 mg).  Rhubarb,  cup (115 mg).  Rice, wild,  cup (80 mg).  Shrimp, 3 oz (155 mg).  Spinach, raw, 1 cup (170 mg).  Strawberries,  cup (125 mg).  Summer squash  cup (175-200 mg).  Swiss chard, raw, 1 cup (135 mg).  Tangerines, 1 each (140 mg).  Tea, brewed, 6 oz (65 mg).  Turnips,  cup (140 mg).  Watermelon,  cup (85 mg).  Wine, red, table, 5 oz (180 mg).  Wine, white, table, 5 oz (100 mg).  Low in potassium The following foods and beverages have less than 50 mg of potassium per serving.  Bread, white, 1 slice (30 mg).  Carbonated beverages, 12 oz (less than 5 mg).  Cheese, 1 oz (20-30 mg).  Cranberries,  cup (45 mg).  Cranberry juice cocktail,  cup (20 mg).  Fats and oils, 1 Tbsp (less than 5 mg).  Hummus, 1 Tbsp (32 mg).  Nectar: papaya, mango, or pear,  cup (35 mg).  Rice, white or brown,  cup (50 mg).  Spaghetti or macaroni,  cup cooked (30 mg).  Tortilla, flour or corn, 1 each (50 mg).  Waffle, 4 in., 1 each (50 mg).  Water chestnuts,  cup (40 mg).  This information is not intended to replace advice given to you by your health care provider. Make sure you discuss any questions you have with your health care provider. Document Released: 10/25/2004 Document Revised: 08/19/2015 Document Reviewed: 02/07/2013 Elsevier Interactive Patient Education  [employer].

## 2017-09-25 NOTE — Progress Notes (Signed)
Cardiology Office Note    Date:  09/25/2017   ID:  Sheryl Carter, DOB 02-13-1941, MRN [id number]  PCP:  Sheryl Nevins, MD  Cardiologist: Sheryl Docker, MD    Chief Complaint  Patient presents with  . Hospitalization Follow-up    History of Present Illness:    Sheryl Carter is a 77 y.o. female with past medical history of CAD (s/p CABG in 05/2016 with LIMA-LAD, SVG-D2, SVG-OM, SVG-RCA), HTN, HLD, and prior TIA's who presents to the office today for Carter follow-up.  She was last examined by Dr. Purvis Sheffield in 05/2017 and reported occasional twinges of pain along her right side with associated neck numbness.  Denied any exertional symptoms. She was continued on her current medication regimen at that time and Metoprolol was not resumed given her recent episodes of bradycardia.  In the interim, she presented to [facility] ED on 09/02/2017 for evaluation of dizziness and fatigue over the past few days. She also reported left-sided chest discomfort that have been constant since the previous day. Symptoms were overall felt to be atypical for a cardiac etiology but she was admitted for observation and rule out for ACS. EKG showed sinus bradycardia, HR 58, with no acute ST or T wave changes when compared to prior tracings. Cyclic troponin values remained negative. She was started on Imdur 15 mg daily and informed to follow-up with cardiology in the outpatient setting. HCTZ was held as she was hypokalemic at the time of admission.  In talking with the patient today, she reports having worsening lower extremity edema since her hospitalization and says weight has increased by 4 pounds since then which has been gradual over the past 3 weeks. Interestingly enough, this has actually declined by 4 pounds as compared to her office visit in 05/2017. BP is overall well-controlled at 132/76 during today's visit by by review of her home records, SBP has been in the 140's at times which is unusual  for her.  She does report occasional episodes of chest discomfort underneath her left breast and along her sternal incision which can last for 30 seconds to a minute and then spontaneously resolves. She reports becoming anxious when this happens but denies any exertional symptoms or positional changes.  No recent dyspnea on exertion, orthopnea, or PND.    Past Medical History:  Diagnosis Date  . Anxiety   . Blindness of left eye   . CAD (coronary artery disease)    a. s/p CABG in 05/2016 with LIMA-LAD, SVG-D2, SVG-OM, SVG-RCA  . Complication of anesthesia    has gas trapped in her body after  . DVT (deep venous thrombosis) (HCC)   . Ear problems   . Family history of adverse reaction to anesthesia    daughter has gas trapped in her body postop as well  . Glaucoma   . Hypercholesterolemia   . Hypertension   . Pre-diabetes    pt. told by Sheryl Carter staff that she is prediabetic, HgbA1c was 5.4  . Stroke (HCC)    having TIA's    Past Surgical History:  Procedure Laterality Date  . BREAST SURGERY Left    cyst- L breast  . CHOLECYSTECTOMY    . CORONARY ARTERY BYPASS GRAFT N/A 06/09/2016   Procedure: CORONARY ARTERY BYPASS GRAFTING (CABG) times 4 using the left internal mammary artery and bilateral thigh greater saphenous veins harvested endoscopically. LIMA to LAD, SVG to DIAGONAL 2, SVG to OM, SVG to RCA;  Surgeon: Sheryl Ovens, MD;  Location: [facility] OR;  Service: Open Heart Surgery;  Laterality: N/A;  . ENDARTERECTOMY Left 05/29/2016   Procedure: LEFT CAROTID ENDARTERECTOMY WITH PATCH ANGIOPLASTY;  Surgeon: Sheryl Earthly, MD;  Location: [facility] OR;  Service: Vascular;  Laterality: Left;  . EYE SURGERY Right    cataract  . LEFT HEART CATH AND CORONARY ANGIOGRAPHY N/A 06/07/2016   Procedure: Left Heart Cath and Coronary Angiography;  Surgeon: Sheryl Ouch, MD;  Location: [facility] INVASIVE CV LAB;  Service: Cardiovascular;  Laterality: N/A;  . TEE WITHOUT CARDIOVERSION N/A 06/09/2016   Procedure:  TRANSESOPHAGEAL ECHOCARDIOGRAM (TEE);  Surgeon: Sheryl Ovens, MD;  Location: [facility] OR;  Service: Open Heart Surgery;  Laterality: N/A;    Current Medications: Outpatient Medications Prior to Visit  Medication Sig Dispense Refill  . ALPRAZolam (XANAX) 0.25 MG tablet Take 0.25 mg by mouth 2 (two) times daily. *May take one additional dose at lunch as needed    . amLODipine (NORVASC) 5 MG tablet Take 1 tablet (5 mg total) by mouth daily. 90 tablet 3  . fexofenadine (ALLEGRA) 180 MG tablet Take 180 mg by mouth daily.    . isosorbide mononitrate (IMDUR) 30 MG 24 hr tablet Take 0.5 tablets (15 mg total) by mouth daily. 30 tablet 11  . Omega-3 Fatty Acids (FISH OIL) 1000 MG CAPS Take 1,000 mg by mouth daily.    . rosuvastatin (CRESTOR) 5 MG tablet TAKE 1 TABLET BY MOUTH ONCE A DAY. 90 tablet 3  . timolol (BETIMOL) 0.5 % ophthalmic solution Place 1 drop into the right eye 2 (two) times daily.     . vitamin B-12 (CYANOCOBALAMIN) 1000 MCG tablet Take 1,000 mcg by mouth at bedtime.     Marland Kitchen aspirin EC 325 MG EC tablet Take 1 tablet (325 mg total) by mouth daily. 30 tablet 0   No facility-administered medications prior to visit.      Allergies:   Other; Prednisone; Statins; Erythromycin; and Tylenol [acetaminophen]   Social History   Socioeconomic History  . Marital status: Divorced    Spouse name: Not on file  . Number of children: 4  . Years of education: 10  . Highest education level: Not on file  Occupational History    Comment: retired  Engineer, production  . Financial resource strain: Not on file  . Food insecurity:    Worry: Not on file    Inability: Not on file  . Transportation needs:    Medical: Not on file    Non-medical: Not on file  Tobacco Use  . Smoking status: Never Smoker  . Smokeless tobacco: Never Used  Substance and Sexual Activity  . Alcohol use: No  . Drug use: No  . Sexual activity: Not Currently  Lifestyle  . Physical activity:    Days per week: Not on file     Minutes per session: Not on file  . Stress: Not on file  Relationships  . Social connections:    Talks on phone: Not on file    Gets together: Not on file    Attends religious service: Not on file    Active member of club or organization: Not on file    Attends meetings of clubs or organizations: Not on file    Relationship status: Not on file  Other Topics Concern  . Not on file  Social History Narrative   Lives with daughter   No caffeine since Dec 2017     Family History:  The patient's family history includes  Heart disease in her sister and son.   Review of Systems:   Please see the history of present illness.     General:  No chills, fever, night sweats or weight changes.  Cardiovascular:  No dyspnea on exertion, orthopnea, palpitations, paroxysmal nocturnal dyspnea. Positive for lower extremity edema and sternal chest pain (along incision).  Dermatological: No rash, lesions/masses Respiratory: No cough, dyspnea Urologic: No hematuria, dysuria Abdominal:   No nausea, vomiting, diarrhea, bright red blood per rectum, melena, or hematemesis Neurologic:  No visual changes, wkns, changes in mental status. All other systems reviewed and are otherwise negative except as noted above.   Physical Exam:    VS:  BP 132/76   Pulse (!) 56   Ht 4' 7.5" (1.41 m)   Wt 137 lb (62.1 kg)   SpO2 97%   BMI 31.27 kg/m    General: Well developed, well nourished Caucasian female appearing in no acute distress. Head: Normocephalic, atraumatic, sclera non-icteric, no xanthomas, nares are without discharge.  Neck: No carotid bruits. JVD not elevated.  Lungs: Respirations regular and unlabored, without wheezes or rales.  Heart: Regular rate and rhythm. No S3 or S4.  No murmur, no rubs, or gallops appreciated. Abdomen: Soft, non-tender, non-distended with normoactive bowel sounds. No hepatomegaly. No rebound/guarding. No obvious abdominal masses. Msk:  Strength and tone appear normal for age.  No joint deformities or effusions. Extremities: No clubbing or cyanosis. Trace edema up to mid-shins bilaterally. Distal pedal pulses are 2+ bilaterally. Neuro: Alert and oriented X 3. Moves all extremities spontaneously. No focal deficits noted. Psych:  Responds to questions appropriately with a normal affect. Skin: No rashes or lesions noted  Wt Readings from Last 3 Encounters:  09/25/17 137 lb (62.1 kg)  09/02/17 131 lb 9.8 oz (59.7 kg)  05/30/17 141 lb (64 kg)     Studies/Labs Reviewed:   EKG:  EKG is not ordered today.    Recent Labs: 09/29/2016: ALT 10 09/02/2017: Hemoglobin 14.6; Platelets 214 09/03/2017: BUN 9; Creatinine, Ser 0.63; Potassium 3.7; Sodium 138   Lipid Panel    Component Value Date/Time   CHOL 157 12/29/2016 0955   TRIG 97 12/29/2016 0955   HDL 51 12/29/2016 0955   CHOLHDL 5.7 (H) 09/29/2016 0859   VLDL 27 09/29/2016 0859   LDLCALC 87 12/29/2016 0955    Additional studies/ records that were reviewed today include:   Limited Echo: 05/2016 Study Conclusions  - Left ventricle: The cavity size was normal. There was mild focal   basal hypertrophy of the septum. Systolic function was normal.   The estimated ejection fraction was in the range of 50% to 55%.   There is akinesis of the inferior myocardium. There is   hypokinesis of the midinferoseptal myocardium. - Mitral valve: Calcified annulus. There was trivial regurgitation.  Cardiac Catheterization: 05/2016  Prox RCA lesion, 80 %stenosed.  Mid RCA lesion, 100 %stenosed.  Ost Cx to Prox Cx lesion, 90 %stenosed.  Ost LAD lesion, 90 %stenosed.  LM lesion, 80 %stenosed.  Prox LAD lesion, 90 %stenosed.  Ost 1st Diag to 1st Diag lesion, 80 %stenosed.  Ost 2nd Mrg to 2nd Mrg lesion, 60 %stenosed.  LV end diastolic pressure is moderately elevated.   1. Severe three-vessel and distal left main disease. 2. Moderately elevated left ventricular end-diastolic pressure. 3. Left ventricular  angiography was not performed. EF was 50-55% by echo.  Recommendations: I consulted CVTS for CABG. The patient has good targets. It was determined that she had  no stroke during this admission. Avoid cardiac catheterization via the right radial artery in the future due to severe innominate artery tortuosity.   Assessment:    1. Coronary artery disease of bypass graft of native heart with stable angina pectoris (HCC)   2. Edema of both lower extremities   3. Bradycardia   4. Essential hypertension   5. Hyperlipidemia LDL goal <70   6. Hypokalemia      Plan:   In order of problems listed above:  1. CAD - s/p CABG in 05/2016 with LIMA-LAD, SVG-D2, SVG-OM, SVG-RCA. Was recently admitted for evaluation of fatigue and her cyclic troponin values remained negative and EKG showed no acute ischemic changes. She was started on Imdur 15 mg daily and reports her symptoms have overall improved since the initiation of this.  When her symptoms do occur, they only last for 30 seconds up to a minute and then spontaneously resolve.   - continue ASA (will reduce to 81mg  daily), statin therapy, and Imdur. Can further titrate Imdur if she has recurrent symptoms. Not on beta-blocker therapy given baseline bradycardia.   2. Lower Extremity Edema - She has experienced worsening symptoms since HCTZ was discontinued during her recent admission.  Denies any recent changes in her respiratory status. She does have trace edema on examination today. Will plan to restart HCTZ at 12.5 mg daily as she was on 25 mg daily previously. Had significant issues with hypokalemia when previously on Lasix. Sodium and fluid restriction were reviewed along with elevating her lower extremities.  3. Bradycardia - Heart rate is at 56 during today's visit. She denies any associated lightheadedness, dizziness, or presyncope.  Has overall done well since discontinuation of beta-blocker therapy. Would continue to avoid AV nodal blocking  agents.  4. HTN - BP is overall well controlled at 132/76 during today's visit but this has been elevated intermittently at times when checked at home. She was previously on HCTZ 25 mg daily but this was held at the time of her recent Carter discharge due to hypokalemia. In the setting of her worsening edema and elevated readings, will plan to restart at 12.5 mg daily and recheck a BMET in 2 weeks.   5. HLD - FLP in 12/2016 showed total cholesterol 157, triglycerides 97, HDL 51, and LDL 87. She has remained on Crestor 5 mg daily as she has been intolerant to higher intensity statin therapy.  6. Hypokalemia - K+ was 3.3 at the time of recent admission, having improved to 3.7 on 09/03/2017. Will recheck a BMET in 2 weeks to reassess potassium levels and kidney function following resumption of HCTZ.   Medication Adjustments/Labs and Tests Ordered: Current medicines are reviewed at length with the patient today.  Concerns regarding medicines are outlined above.  Medication changes, Labs and Tests ordered today are listed in the Patient Instructions below. Patient Instructions  Medication Instructions:  Your physician has recommended you make the following change in your medication:  Start HCTZ 12.5 Daily  Nitro    Labwork: Your physician recommends that you return for lab work in: 2 Weeks     Testing/Procedures: NONE   Follow-Up: Your physician recommends that you schedule a follow-up appointment in: 4 Months    Any Other Special Instructions Will Be Listed Below (If Applicable).   If you need a refill on your cardiac medications before your next appointment, please call your pharmacy.   Potassium Content of Foods Potassium is a mineral found in many foods and drinks.  It helps keep fluids and minerals balanced in your body and affects how steadily your heart beats. Potassium also helps control your blood pressure and keep your muscles and nervous system healthy. Certain health  conditions and medicines may change the balance of potassium in your body. When this happens, you can help balance your level of potassium through the foods that you do or do not eat. Your health care provider or dietitian may recommend an amount of potassium that you should have each day. The following lists of foods provide the amount of potassium (in parentheses) per serving in each item. High in potassium The following foods and beverages have 200 mg or more of potassium per serving:  Apricots, 2 raw or 5 dry (200 mg).  Artichoke, 1 medium (345 mg).  Avocado, raw,  each (245 mg).  Banana, 1 medium (425 mg).  Beans, lima, or baked beans, canned,  cup (280 mg).  Beans, white, canned,  cup (595 mg).  Beef roast, 3 oz (320 mg).  Beef, ground, 3 oz (270 mg).  Beets, raw or cooked,  cup (260 mg).  Bran muffin, 2 oz (300 mg).  Broccoli,  cup (230 mg).  Brussels sprouts,  cup (250 mg).  Cantaloupe,  cup (215 mg).  Cereal, 100% bran,  cup (200-400 mg).  Cheeseburger, single, fast food, 1 each (225-400 mg).  Chicken, 3 oz (220 mg).  Clams, canned, 3 oz (535 mg).  Crab, 3 oz (225 mg).  Dates, 5 each (270 mg).  Dried beans and peas,  cup (300-475 mg).  Figs, dried, 2 each (260 mg).  Fish: halibut, tuna, cod, snapper, 3 oz (480 mg).  Fish: salmon, haddock, swordfish, perch, 3 oz (300 mg).  Fish, tuna, canned 3 oz (200 mg).  [place] fries, fast food, 3 oz (470 mg).  Granola with fruit and nuts,  cup (200 mg).  Grapefruit juice,  cup (200 mg).  Greens, beet,  cup (655 mg).  Honeydew melon,  cup (200 mg).  Kale, raw, 1 cup (300 mg).  Kiwi, 1 medium (240 mg).  Kohlrabi, rutabaga, parsnips,  cup (280 mg).  Lentils,  cup (365 mg).  Mango, 1 each (325 mg).  Milk, chocolate, 1 cup (420 mg).  Milk: nonfat, low-fat, whole, buttermilk, 1 cup (350-380 mg).  Molasses, 1 Tbsp (295 mg).  Mushrooms,  cup (280) mg.  Nectarine, 1 each (275  mg).  Nuts: almonds, peanuts, hazelnuts, [place], cashew, mixed, 1 oz (200 mg).  Nuts, pistachios, 1 oz (295 mg).  Orange, 1 each (240 mg).  Orange juice,  cup (235 mg).  Papaya, medium,  fruit (390 mg).  Peanut butter, chunky, 2 Tbsp (240 mg).  Peanut butter, smooth, 2 Tbsp (210 mg).  Pear, 1 medium (200 mg).  Pomegranate, 1 whole (400 mg).  Pomegranate juice,  cup (215 mg).  Pork, 3 oz (350 mg).  Potato chips, salted, 1 oz (465 mg).  Potato, baked with skin, 1 medium (925 mg).  Potatoes, boiled,  cup (255 mg).  Potatoes, mashed,  cup (330 mg).  Prune juice,  cup (370 mg).  Prunes, 5 each (305 mg).  Pudding, chocolate,  cup (230 mg).  Pumpkin, canned,  cup (250 mg).  Raisins, seedless,  cup (270 mg).  Seeds, sunflower or pumpkin, 1 oz (240 mg).  Soy milk, 1 cup (300 mg).  Spinach,  cup (420 mg).  Spinach, canned,  cup (370 mg).  Sweet potato, baked with skin, 1 medium (450 mg).  Swiss chard,  cup (  480 mg).  Tomato or vegetable juice,  cup (275 mg).  Tomato sauce or puree,  cup (400-550 mg).  Tomato, raw, 1 medium (290 mg).  Tomatoes, canned,  cup (200-300 mg).  [place]urkey, 3 oz (250 mg).  Wheat germ, 1 oz (250 mg).  Winter squash,  cup (250 mg).  Yogurt, plain or fruited, 6 oz (260-435 mg).  Zucchini,  cup (220 mg).  Moderate in potassium The following foods and beverages have 50-200 mg of potassium per serving:  Apple, 1 each (150 mg).  Apple juice,  cup (150 mg).  Applesauce,  cup (90 mg).  Apricot nectar,  cup (140 mg).  Asparagus, small spears,  cup or 6 spears (155 mg).  Bagel, cinnamon raisin, 1 each (130 mg).  Bagel, egg or plain, 4 in., 1 each (70 mg).  Beans, green,  cup (90 mg).  Beans, yellow,  cup (190 mg).  Beer, regular, 12 oz (100 mg).  Beets, canned,  cup (125 mg).  Blackberries,  cup (115 mg).  Blueberries,  cup (60 mg).  Bread, whole wheat, 1 slice (70 mg).  Broccoli, raw,   cup (145 mg).  Cabbage,  cup (150 mg).  Carrots, cooked or raw,  cup (180 mg).  Cauliflower, raw,  cup (150 mg).  Celery, raw,  cup (155 mg).  Cereal, bran flakes, cup (120-150 mg).  Cheese, cottage,  cup (110 mg).  Cherries, 10 each (150 mg).  Chocolate, 1 oz bar (165 mg).  Coffee, brewed 6 oz (90 mg).  Corn,  cup or 1 ear (195 mg).  Cucumbers,  cup (80 mg).  Egg, large, 1 each (60 mg).  Eggplant,  cup (60 mg).  Endive, raw, cup (80 mg).  English muffin, 1 each (65 mg).  Fish, orange roughy, 3 oz (150 mg).  Frankfurter, beef or pork, 1 each (75 mg).  Fruit cocktail,  cup (115 mg).  Grape juice,  cup (170 mg).  Grapefruit,  fruit (175 mg).  Grapes,  cup (155 mg).  Greens: kale, turnip, collard,  cup (110-150 mg).  Ice cream or frozen yogurt, chocolate,  cup (175 mg).  Ice cream or frozen yogurt, vanilla,  cup (120-150 mg).  Lemons, limes, 1 each (80 mg).  Lettuce, all types, 1 cup (100 mg).  Mixed vegetables,  cup (150 mg).  Mushrooms, raw,  cup (110 mg).  Nuts: walnuts, pecans, or macadamia, 1 oz (125 mg).  Oatmeal,  cup (80 mg).  Okra,  cup (110 mg).  Onions, raw,  cup (120 mg).  Peach, 1 each (185 mg).  Peaches, canned,  cup (120 mg).  Pears, canned,  cup (120 mg).  Peas, green, frozen,  cup (90 mg).  Peppers, green,  cup (130 mg).  Peppers, red,  cup (160 mg).  Pineapple juice,  cup (165 mg).  Pineapple, fresh or canned,  cup (100 mg).  Plums, 1 each (105 mg).  Pudding, vanilla,  cup (150 mg).  Raspberries,  cup (90 mg).  Rhubarb,  cup (115 mg).  Rice, wild,  cup (80 mg).  Shrimp, 3 oz (155 mg).  Spinach, raw, 1 cup (170 mg).  Strawberries,  cup (125 mg).  Summer squash  cup (175-200 mg).  Swiss chard, raw, 1 cup (135 mg).  Tangerines, 1 each (140 mg).  Tea, brewed, 6 oz (65 mg).  Turnips,  cup (140 mg).  Watermelon,  cup (85 mg).  Wine, red, table, 5 oz (180  mg).  Wine, white, table, 5 oz (100 mg).  Low in potassium The following foods and beverages have less than 50 mg of potassium per serving.  Bread, white, 1 slice (30 mg).  Carbonated beverages, 12 oz (less than 5 mg).  Cheese, 1 oz (20-30 mg).  Cranberries,  cup (45 mg).  Cranberry juice cocktail,  cup (20 mg).  Fats and oils, 1 Tbsp (less than 5 mg).  Hummus, 1 Tbsp (32 mg).  Nectar: papaya, mango, or pear,  cup (35 mg).  Rice, white or brown,  cup (50 mg).  Spaghetti or macaroni,  cup cooked (30 mg).  Tortilla, flour or corn, 1 each (50 mg).  Waffle, 4 in., 1 each (50 mg).  Water chestnuts,  cup (40 mg).  This information is not intended to replace advice given to you by your health care provider. Make sure you discuss any questions you have with your health care provider. Document Released: 10/25/2004 Document Revised: 08/19/2015 Document Reviewed: 02/07/2013 Elsevier Interactive Patient Education  [address]     Signed, Ellsworth Lennox, [place]  09/25/2017 5:58 PM    [facility] [address] [place], [place] [address] Phone: [phone]

## 2017-10-08 ENCOUNTER — Other Ambulatory Visit (HOSPITAL_COMMUNITY)
Admission: RE | Admit: 2017-10-08 | Discharge: 2017-10-08 | Disposition: A | Discharge status: Home / Self Care | Payer: Medicare HMO | Source: Ambulatory Visit | Attending: Student | Admitting: Student

## 2017-10-08 DIAGNOSIS — E876 Hypokalemia: Secondary | ICD-10-CM | POA: Insufficient documentation

## 2017-10-08 LAB — BASIC METABOLIC PANEL
Anion gap: 8 (ref 5–15)
BUN: 17 mg/dL (ref 8–23)
CALCIUM: 9.6 mg/dL (ref 8.9–10.3)
CO2: 32 mmol/L (ref 22–32)
CREATININE: 0.8 mg/dL (ref 0.44–1.00)
Chloride: 99 mmol/L (ref 98–111)
GFR calc Af Amer: >60 mL/min (ref >60)
GFR calc non Af Amer: >60 mL/min (ref >60)
Glucose, Bld: 99 mg/dL (ref 70–99)
Potassium: 3.6 mmol/L (ref 3.5–5.1)
SODIUM: 139 mmol/L (ref 135–145)

## 2017-10-14 ENCOUNTER — Emergency Department (HOSPITAL_COMMUNITY)
Admission: EM | Admit: 2017-10-14 | Discharge: 2017-10-14 | Disposition: A | Discharge status: Home / Self Care | Payer: Medicare HMO | Attending: Emergency Medicine | Admitting: Emergency Medicine

## 2017-10-14 ENCOUNTER — Encounter (HOSPITAL_COMMUNITY): Payer: Self-pay | Admitting: *Deleted

## 2017-10-14 ENCOUNTER — Emergency Department (HOSPITAL_COMMUNITY): Payer: Medicare HMO

## 2017-10-14 ENCOUNTER — Other Ambulatory Visit: Payer: Self-pay

## 2017-10-14 DIAGNOSIS — R682 Dry mouth, unspecified: Secondary | ICD-10-CM | POA: Insufficient documentation

## 2017-10-14 DIAGNOSIS — R200 Anesthesia of skin: Secondary | ICD-10-CM | POA: Insufficient documentation

## 2017-10-14 DIAGNOSIS — I10 Essential (primary) hypertension: Secondary | ICD-10-CM | POA: Diagnosis not present

## 2017-10-14 DIAGNOSIS — I259 Chronic ischemic heart disease, unspecified: Secondary | ICD-10-CM | POA: Diagnosis not present

## 2017-10-14 DIAGNOSIS — Z86718 Personal history of other venous thrombosis and embolism: Secondary | ICD-10-CM | POA: Diagnosis not present

## 2017-10-14 DIAGNOSIS — Z8673 Personal history of transient ischemic attack (TIA), and cerebral infarction without residual deficits: Secondary | ICD-10-CM | POA: Diagnosis not present

## 2017-10-14 DIAGNOSIS — R2990 Unspecified symptoms and signs involving the nervous system: Secondary | ICD-10-CM

## 2017-10-14 DIAGNOSIS — I639 Cerebral infarction, unspecified: Secondary | ICD-10-CM | POA: Diagnosis not present

## 2017-10-14 LAB — CBC
HCT: 39.1 % (ref 36.0–46.0)
Hemoglobin: 13.7 g/dL (ref 12.0–15.0)
MCH: 31.9 pg (ref 26.0–34.0)
MCHC: 35 g/dL (ref 30.0–36.0)
MCV: 91.1 fL (ref 78.0–100.0)
PLATELETS: 205 10*3/uL (ref 150–400)
RBC: 4.29 MIL/uL (ref 3.87–5.11)
RDW: 12.2 % (ref 11.5–15.5)
WBC: 6.7 10*3/uL (ref 4.0–10.5)

## 2017-10-14 LAB — DIFFERENTIAL
BASOS PCT: 0 %
Basophils Absolute: 0 10*3/uL (ref 0.0–0.1)
EOS ABS: 0.1 10*3/uL (ref 0.0–0.7)
EOS PCT: 2 %
LYMPHS PCT: 39 %
Lymphs Abs: 2.6 10*3/uL (ref 0.7–4.0)
MONO ABS: 0.5 10*3/uL (ref 0.1–1.0)
Monocytes Relative: 7 %
Neutro Abs: 3.5 10*3/uL (ref 1.7–7.7)
Neutrophils Relative %: 52 %

## 2017-10-14 LAB — COMPREHENSIVE METABOLIC PANEL
ALK PHOS: 74 U/L (ref 38–126)
ALT: 33 U/L (ref 0–44)
ANION GAP: 8 (ref 5–15)
AST: 36 U/L (ref 15–41)
Albumin: 3.7 g/dL (ref 3.5–5.0)
BUN: 10 mg/dL (ref 8–23)
CALCIUM: 9.2 mg/dL (ref 8.9–10.3)
CO2: 28 mmol/L (ref 22–32)
Chloride: 95 mmol/L — ABNORMAL LOW (ref 98–111)
Creatinine, Ser: 0.64 mg/dL (ref 0.44–1.00)
GFR calc Af Amer: >60 mL/min (ref >60)
GFR calc non Af Amer: >60 mL/min (ref >60)
Glucose, Bld: 120 mg/dL — ABNORMAL HIGH (ref 70–99)
POTASSIUM: 3.3 mmol/L — AB (ref 3.5–5.1)
SODIUM: 131 mmol/L — AB (ref 135–145)
Total Bilirubin: 0.9 mg/dL (ref 0.3–1.2)
Total Protein: 6.6 g/dL (ref 6.5–8.1)

## 2017-10-14 LAB — CBG MONITORING, ED: GLUCOSE-CAPILLARY: 115 mg/dL — AB (ref 70–99)

## 2017-10-14 LAB — PROTIME-INR
INR: 0.96
PROTHROMBIN TIME: 12.7 s (ref 11.4–15.2)

## 2017-10-14 LAB — APTT: aPTT: 36 seconds (ref 24–36)

## 2017-10-14 LAB — TROPONIN I: Troponin I: <0.03 ng/mL (ref <0.03)

## 2017-10-14 NOTE — ED Provider Notes (Signed)
Emergency Department Provider Note   I have reviewed the triage vital signs and the nursing notes.   HISTORY  Chief Complaint Possible stroke   HPI Sheryl Carter is a 77 y.o. female with PMH of CAD, HLD, HTN, and remote posterior circulation CVA presents to the emergency department for evaluation of numbness in the bilateral arms, calves, left neck.  Patient states that she will have these symptoms intermittently. They frequently occur as bilateral symptoms. The arm numbness is localized along the medial elbow and not extending either proximally or distally. He leg numbness is bilateral calf. Denies weakness, gait disturbance, or vision changes. She is also noticing very dry mouth. She was recently re-started on HCTZ. Symptoms began 30-45 minutes prior to ED presentation.    Past Medical History:  Diagnosis Date  . Anxiety   . Blindness of left eye   . CAD (coronary artery disease)    a. s/p CABG in 05/2016 with LIMA-LAD, SVG-D2, SVG-OM, SVG-RCA  . Complication of anesthesia    has gas trapped in her body after  . DVT (deep venous thrombosis) (HCC)   . Ear problems   . Family history of adverse reaction to anesthesia    daughter has gas trapped in her body postop as well  . Glaucoma   . Hypercholesterolemia   . Hypertension   . Pre-diabetes    [facility] that she is prediabetic, HgbA1c was 5.4  . Stroke [facility](HCC)    having TIA's    Patient Active Problem List   Diagnosis Date Noted  . Chest pain 09/02/2017  . Constipation 06/26/2016  . 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 Coronary artery disease   . NSTEMI (non-ST elevated myocardial infarction) (HCC) 06/07/2016  . Paresthesia   . TIA (transient ischemic attack) 06/04/2016  . Symptomatic carotid artery stenosis, left 05/29/2016  . Slurred speech 04/18/2016  . Hypertension, essential 04/18/2016  . Hyperlipidemia 04/18/2016  . Chronic anxiety 04/18/2016    Past Surgical History:  Procedure Laterality Date  .  BREAST SURGERY Left    cyst- L breast  . CHOLECYSTECTOMY    . CORONARY ARTERY BYPASS GRAFT N/A 06/09/2016   Procedure: CORONARY ARTERY BYPASS GRAFTING (CABG) times 4 using the left internal mammary artery and bilateral thigh greater saphenous veins harvested endoscopically. LIMA to LAD, SVG to DIAGONAL 2, SVG to OM, SVG to RCA;  Surgeon: Delight OvensEdward B Gerhardt, MD;  [facility];  Service: Open Heart Surgery;  Laterality: N/A;  . ENDARTERECTOMY Left 05/29/2016   Procedure: LEFT CAROTID ENDARTERECTOMY WITH PATCH ANGIOPLASTY;  Surgeon: Larina Earthlyodd F Early, MD;  [facility];  Service: Vascular;  Laterality: Left;  . EYE SURGERY Right    cataract  . LEFT HEART CATH AND CORONARY ANGIOGRAPHY N/A 06/07/2016   Procedure: Left Heart Cath and Coronary Angiography;  Surgeon: Iran OuchMuhammad A Arida, MD;  Location: [facility] INVASIVE CV LAB;  Service: Cardiovascular;  Laterality: N/A;  . TEE WITHOUT CARDIOVERSION N/A 06/09/2016   Procedure: TRANSESOPHAGEAL ECHOCARDIOGRAM (TEE);  Surgeon: Delight OvensEdward B Gerhardt, MD;  [facility];  Service: Open Heart Surgery;  Laterality: N/A;    Allergies Other; Prednisone; Statins; Erythromycin; and Tylenol [acetaminophen]  Family History  Problem Relation Age of Onset  . Heart disease Sister   . Heart disease Son     Social History Social History   Tobacco Use  . Smoking status: Never Smoker  . Smokeless tobacco: Never Used  Substance Use Topics  . Alcohol use: No  .  Drug use: No    Review of Systems  Constitutional: No fever/chills Eyes: No visual changes. ENT: No sore throat. Positive dry mouth.  Cardiovascular: Denies chest pain. Respiratory: Denies shortness of breath. Gastrointestinal: No abdominal pain.  No nausea, no vomiting.  No diarrhea.  No constipation. Genitourinary: Negative for dysuria. Musculoskeletal: Negative for back pain. Skin: Negative for rash. Neurological: Negative for headaches, focal weakness. Positive bilateral arm/leg numbness and left face  numbness (intermittent)  10-point ROS otherwise negative.  ____________________________________________   PHYSICAL EXAM:  VITAL SIGNS: ED Triage Vitals  Enc Vitals Group     BP 10/14/17 2006 (!) 158/79     Pulse Rate 10/14/17 2006 83     Resp 10/14/17 2006 20     Temp 10/14/17 2006 (!) 97.4 F (36.3 C)     Temp Source 10/14/17 2006 Oral     SpO2 10/14/17 2006 100 %     Weight 10/14/17 2006 137 lb (62.1 kg)     Height 10/14/17 2006 4\' 7"  (1.397 m)     Pain Score 10/14/17 2008 0   Constitutional: Alert and oriented. Well appearing and in no acute distress. Eyes: Conjunctivae are normal.  Head: Atraumatic. Nose: No congestion/rhinnorhea. Mouth/Throat: Mucous membranes are moist.  Neck: No stridor.   Cardiovascular: Normal rate, regular rhythm. Good peripheral circulation. Grossly normal heart sounds.   Respiratory: Normal respiratory effort.  No retractions. Lungs CTAB. Gastrointestinal: Soft and nontender. No distention.  Musculoskeletal: No lower extremity tenderness nor edema. No gross deformities of extremities. Neurologic:  Normal speech and language. Subjective decreased sensation over the left lower face. Otherwise normal CN exam 2-12. No sensory deficit appreciated in the upper or lower extremities. Normal strength bilaterally.  Skin:  Skin is warm, dry and intact. No rash noted.   ____________________________________________   LABS (all labs ordered are listed, but only abnormal results are displayed)  Labs Reviewed  COMPREHENSIVE METABOLIC PANEL - Abnormal; Notable for the following components:      Result Value   Sodium 131 (*)    Potassium 3.3 (*)    Chloride 95 (*)    Glucose, Bld 120 (*)    All other components within normal limits  CBG MONITORING, ED - Abnormal; Notable for the following components:   Glucose-Capillary 115 (*)    All other components within normal limits  PROTIME-INR  APTT  CBC  DIFFERENTIAL  TROPONIN I  CBG MONITORING, ED    ____________________________________________  EKG   EKG Interpretation  Date/Time:  Sunday October 14 2017 21:39:12 EDT Ventricular Rate:  52 PR Interval:    QRS Duration: 98 QT Interval:  496 QTC Calculation: 462 R Axis:   50 Text Interpretation:  Sinus rhythm No STEMI.  Confirmed by Alona Bene [phone]) on 10/15/2017 10:13:14 AM       ____________________________________________  RADIOLOGY  Ct Head Wo Contrast  Result Date: 10/14/2017 CLINICAL DATA:  Numbness to the tongue and throat, both arms and both legs EXAM: CT HEAD WITHOUT CONTRAST TECHNIQUE: Contiguous axial images were obtained from the base of the skull through the vertex without intravenous contrast. COMPARISON:  MRI 07/28/2016, head CT 07/28/2016 FINDINGS: Brain: No acute territorial infarction, hemorrhage, or intracranial mass. Old right cerebellar infarct. Mild atrophy. Mild small vessel ischemic changes of the white matter. Stable ventricle size. Vascular: No hyperdense vessels.  Carotid vascular calcification Skull: Normal. Negative for fracture or focal lesion. Sinuses/Orbits: No acute finding. Other: None IMPRESSION: 1. No CT evidence for acute intracranial abnormality. 2. Atrophy  and small vessel ischemic changes of the white matter. Old right cerebellar infarct. Electronically Signed   By: Jasmine Pang M.D.   On: 10/14/2017 21:28    ____________________________________________   PROCEDURES  Procedure(s) performed:   Procedures  None ____________________________________________   INITIAL IMPRESSION / ASSESSMENT AND PLAN / ED COURSE  Pertinent labs & imaging results that were available during my care of the patient were reviewed by me and considered in my medical decision making (see chart for details).  Patient presents to the ED with dry mouth and subjective numbness in specific regions of the bilateral arms/legs and left face numbness. Symptoms returned 30 min prior to ED presentation. This is not  consistent with CVA and on further investigation this has been intermittent over the last year. CT head obtained along with labs and ekg which are normal. Tele-Neurology evaluated the patient and agrees with no further ED evaluation. Will refer to outpatient Neurology. Dry mouth likely 2/2 medication side effect. Will discuss the PCP.   At this time, I do not feel there is any life-threatening condition present. I have reviewed and discussed all results (EKG, imaging, lab, urine as appropriate), exam findings with patient. I have reviewed nursing notes and appropriate previous records.  I feel the patient is safe to be discharged home without further emergent workup. Discussed usual and customary return precautions. Patient and family (if present) verbalize understanding and are comfortable with this plan.  Patient will follow-up with their primary care provider. If they do not have a primary care provider, information for follow-up has been provided to them. All questions have been answered.    ____________________________________________  FINAL CLINICAL IMPRESSION(S) / ED DIAGNOSES  Final diagnoses:  Stroke-like symptoms    Note:  This document was prepared using Dragon voice recognition software and may include unintentional dictation errors.  Alona Bene, MD Emergency Medicine    Long, Arlyss Repress, MD 10/15/17 1016

## 2017-10-14 NOTE — ED Notes (Addendum)
Pt states that she symptoms started around 6pm tonight, pt admits to taking Allegra earlier in the day, pt reports numbness to bilateral extremities that feels the same, numbness that is worse to left side of face that seems more pronounced than right side, Dr Jacqulyn BathLong at triage with pt,

## 2017-10-14 NOTE — ED Triage Notes (Signed)
Pt states she just wasn't feeling good and that her mouth has been dry and she has been drinking lots of water today. Pt c/o numbness to tongue, throat, both arms, and both legs.

## 2017-10-14 NOTE — Discharge Instructions (Signed)
You were seen in the ED today with dry mouth and numbness symptoms. Your labs and CT of the head were normal. This does not appear to be related to stroke. I plan on having you follow up with your PCP regarding the dry mouth to consider possible medication side effects. Call the neurologist listed to arrange and outpatient appointment to discuss this intermittent numbness in the arms, legs, and face.   Return to the ED with any new or worsening symptoms.

## 2017-10-14 NOTE — Consult Note (Signed)
    [facility] Consult Services  Date of service:  10/14/17   Impression:   Nonlocalizing intermittent numbness for one year.  Exam demonstrates only reduced light touch left lower face intact/nonfocal.  I do not find exam is supportive of brain, cord, nerve localization.  Consider toxic/metabolic etiology vs. secondary effect of memory/stress issues (dx of exclusion).  Recommendations:  This does not require admission from neurology standpoint  outpatient f/u to check B12, folate, tsh and eval with outpatient pcp and/or neuro if memory eval desired  Discussed with Dr. Jacqulyn BathLong, ED  Please contact [facility] to reach me if further questions/concerns arise.  ______________________________________________________________   History of Present Illness: [age]year old woman with baseline blindness of left eye, prior carotid surgery, MI in 2018presents to ED today because "my mouth got real dry...my arms got numb...the back of my legs got numb."  She specifies BUE numbness was from shoulder to elbow and left LE numbness was only distal to knee, whle right LE numbness was on medial aspect of knee.  She cannot recall if sensory loss was simultaneous or not.  She states this has happened before to ED physician but states "I can't remember" when I inquire and then admits this is true.  Daughter admits she also reported numbness under her chin today as well.    Patient denies focal weakness, gait change, incontinence.  She denies DM.  Her daughter is at bedside and confirms patient with memory trouble since her MI worsened with getting upset at baseline and reports this numbness has occured "occasionally for the past one year."  Patient has not been evaluated for her memory issue.  Exam:  Neurological exam:  awake, alert oriented to person, situation, daughter, year, place speech clear, fluent, appropriate normal naming, comprehension, repetition  left eye  deformity at baseline face strong/symmetric reduced light touch left lower face/jaw line  no drift BUE and BLE normal finger to nose bilaterally  intact sensation to light touch BUE and BLE prox/distal  no sensory level to light touch bilateral ventral trunk  Imaging:  CT head:  Old cerebellar infarct    Medical Data Reviewed:  1.Data reviewed include clinical labs, radiology,  Medical Tests;   2.Tests results discussed with performing or interpreting physician;   3. Reviewed available prior medical records;  4. Obtained case history from another source if applicable/needed;  5. Independent review of image, tracing.   Medical Decision Making:  - Extensive number of diagnosis or management options are considered above.   - Extensive amount of complex data reviewed.   - High risk of complication and/or morbidity or mortality are associated with differential diagnostic considerations above.  - There may be uncertain outcome and increased probability of prolonged functional impairment or high probability of severe prolonged functional impairment associated with some of these differential diagnosis.    Patient was informed the Neurology Consult would happen via TeleHealth consult by way of interactive audio and video telecommunications and consented to receiving care in this manner.

## 2017-10-17 ENCOUNTER — Telehealth: Payer: Self-pay | Admitting: Cardiovascular Disease

## 2017-10-17 NOTE — Telephone Encounter (Signed)
Patient c/o BP is too high. BP was 147/97 this morning before taking any medications. Patient is very concerned about this since she was seen in the ED for elevated BP and numbness in her extremities and face. Reassured patient that her scan was normal and that her BP is elevated but not severely elevated. Advised patient to start checking her BP once daily, same arm, same cuff, one hour after medications & 5-10 minutes after sitting. Asked patient to record BP readings and call our office in one week with the results. Patient confirmed that her BP monitor was 6-8 months old and that she has not changed her diet. Advised to contact her PCP about the numbness if it continues. Patient said she has an appointment next week with her PCP. Denied chest pain, dizziness or sob. Medications reconciled. Verbalized understanding of plan.

## 2017-10-17 NOTE — Telephone Encounter (Signed)
Pt was in the ER Sunday, she called stating she's having high BP and wants to know what she needs to do to bring it down. Please give her a call

## 2017-10-25 DIAGNOSIS — R2681 Unsteadiness on feet: Secondary | ICD-10-CM | POA: Diagnosis not present

## 2017-10-25 DIAGNOSIS — R202 Paresthesia of skin: Secondary | ICD-10-CM | POA: Diagnosis not present

## 2017-10-25 DIAGNOSIS — F419 Anxiety disorder, unspecified: Secondary | ICD-10-CM | POA: Diagnosis not present

## 2017-10-25 DIAGNOSIS — R945 Abnormal results of liver function studies: Secondary | ICD-10-CM | POA: Diagnosis not present

## 2017-10-25 DIAGNOSIS — E6609 Other obesity due to excess calories: Secondary | ICD-10-CM | POA: Diagnosis not present

## 2017-10-25 DIAGNOSIS — I10 Essential (primary) hypertension: Secondary | ICD-10-CM | POA: Diagnosis not present

## 2017-10-25 DIAGNOSIS — R5383 Other fatigue: Secondary | ICD-10-CM | POA: Diagnosis not present

## 2017-10-25 DIAGNOSIS — G629 Polyneuropathy, unspecified: Secondary | ICD-10-CM | POA: Diagnosis not present

## 2017-10-25 DIAGNOSIS — Z6830 Body mass index (BMI) 30.0-30.9, adult: Secondary | ICD-10-CM | POA: Diagnosis not present

## 2017-10-25 DIAGNOSIS — E669 Obesity, unspecified: Secondary | ICD-10-CM | POA: Diagnosis not present

## 2017-10-25 DIAGNOSIS — K219 Gastro-esophageal reflux disease without esophagitis: Secondary | ICD-10-CM | POA: Diagnosis not present

## 2017-10-26 ENCOUNTER — Other Ambulatory Visit (HOSPITAL_COMMUNITY): Payer: Self-pay | Admitting: Internal Medicine

## 2017-10-26 DIAGNOSIS — R7989 Other specified abnormal findings of blood chemistry: Secondary | ICD-10-CM

## 2017-10-26 DIAGNOSIS — R945 Abnormal results of liver function studies: Secondary | ICD-10-CM

## 2017-10-31 ENCOUNTER — Ambulatory Visit (HOSPITAL_COMMUNITY)
Admission: RE | Admit: 2017-10-31 | Discharge: 2017-10-31 | Disposition: A | Discharge status: Home / Self Care | Payer: Medicare HMO | Source: Ambulatory Visit | Attending: Internal Medicine | Admitting: Internal Medicine

## 2017-10-31 DIAGNOSIS — R932 Abnormal findings on diagnostic imaging of liver and biliary tract: Secondary | ICD-10-CM | POA: Diagnosis not present

## 2017-10-31 DIAGNOSIS — R7989 Other specified abnormal findings of blood chemistry: Secondary | ICD-10-CM

## 2017-10-31 DIAGNOSIS — R945 Abnormal results of liver function studies: Secondary | ICD-10-CM | POA: Diagnosis present

## 2017-10-31 DIAGNOSIS — R748 Abnormal levels of other serum enzymes: Secondary | ICD-10-CM | POA: Diagnosis not present

## 2017-10-31 DIAGNOSIS — Z9049 Acquired absence of other specified parts of digestive tract: Secondary | ICD-10-CM | POA: Diagnosis not present

## 2018-01-10 ENCOUNTER — Institutional Professional Consult (permissible substitution): Payer: Medicare HMO | Admitting: Neurology

## 2018-01-21 DIAGNOSIS — Z681 Body mass index (BMI) 19 or less, adult: Secondary | ICD-10-CM | POA: Diagnosis not present

## 2018-01-21 DIAGNOSIS — Z0000 Encounter for general adult medical examination without abnormal findings: Secondary | ICD-10-CM | POA: Diagnosis not present

## 2018-01-21 DIAGNOSIS — Z1389 Encounter for screening for other disorder: Secondary | ICD-10-CM | POA: Diagnosis not present

## 2018-01-29 ENCOUNTER — Ambulatory Visit: Payer: Medicare HMO | Admitting: Cardiovascular Disease

## 2018-01-29 ENCOUNTER — Encounter: Payer: Self-pay | Admitting: Cardiovascular Disease

## 2018-01-29 VITALS — BP 122/64 | HR 98 | Ht <= 58 in | Wt 129.0 lb

## 2018-01-29 DIAGNOSIS — E785 Hyperlipidemia, unspecified: Secondary | ICD-10-CM

## 2018-01-29 DIAGNOSIS — Z9889 Other specified postprocedural states: Secondary | ICD-10-CM

## 2018-01-29 DIAGNOSIS — I25708 Atherosclerosis of coronary artery bypass graft(s), unspecified, with other forms of angina pectoris: Secondary | ICD-10-CM

## 2018-01-29 DIAGNOSIS — I10 Essential (primary) hypertension: Secondary | ICD-10-CM | POA: Diagnosis not present

## 2018-01-29 DIAGNOSIS — R001 Bradycardia, unspecified: Secondary | ICD-10-CM

## 2018-01-29 DIAGNOSIS — R600 Localized edema: Secondary | ICD-10-CM | POA: Diagnosis not present

## 2018-01-29 NOTE — Patient Instructions (Signed)
Medication Instructions:  Your physician recommends that you continue on your current medications as directed. Please refer to the Current Medication list given to you today.  If you need a refill on your cardiac medications before your next appointment, please call your pharmacy.   Lab work:None  If you have labs (blood work) drawn today and your tests are completely normal, you will receive your results only by: . MyChart Message (if you have MyChart) OR . A paper copy in the mail If you have any lab test that is abnormal or we need to change your treatment, we will call you to review the results.  Testing/Procedures:  NONE Follow-Up: At [facility], you and your health needs are our priority.  As part of our continuing mission to provide you with exceptional heart care, we have created designated Provider Care Teams.  These Care Teams include your primary Cardiologist (physician) and Advanced Practice Providers (APPs -  Physician Assistants and Nurse Practitioners) who all work together to provide you with the care you need, when you need it. You will need a follow up appointment in 6 months.  Please call our office 2 months in advance to schedule this appointment.  You may see Suresh Koneswaran, MD or one of the following Advanced Practice Providers on your designated Care Team:   Brittany Strader, PA-C ([place] Office) . Michele Lenze, PA-C ([place] Office)  Any Other Special Instructions Will Be Listed Below (If Applicable).  NONE  

## 2018-01-29 NOTE — Progress Notes (Signed)
SUBJECTIVE: Patient presents for routine follow-up.  Past medical history includes coronary artery disease with a history of four-vessel CABG in March 2018 with LIMA-LAD, SVG-D2, SVG-OM, SVG-RCA.  She also has hypertension and hyperlipidemia.  She has a history of left carotid endarterectomy as well.  Echocardiogram in March 2018 demonstrated normal left ventricular systolic function, LVEF 50-55%, with akinesis of the inferior myocardium and hypokinesis of the mid inferoseptal myocardium.  Carotid Dopplers in October 2018 demonstrated a patent left carotid endarterectomy in 1-39% right internal carotid artery stenosis.  She has occasional right sided chest pains which are sharp and have occurred ever since surgery.  She denies exertional chest pain and dyspnea.  She also denies palpitations and dizziness.  Her blood pressure fluctuates at home when she is anxious.  She has a long history of anxiety.   Soc Hx: Her granddaughter, Lanora Manis, will be graduating with degrees in English and history from [employer] in December and plans to pursue a PhD in [place] at [employer].  Review of Systems: As per "subjective", otherwise negative.  Allergies  Allergen Reactions  . Other Hives    Mycins-   Pt has Glaucoma in Right eye, Blind in Left eye .... PLEASE DO NOT GIVE ANYTHING TO PATIENT THAT MIGHT DESTROY VISION   . Prednisone Other (See Comments)    Altered mental status  . Statins Other (See Comments)    MYALGIAS Weakness, muscle aches, and pain  . Erythromycin     UNSPECIFIED REACTION   . Tylenol [Acetaminophen] Other (See Comments)    Hallucinations    Current Outpatient Medications  Medication Sig Dispense Refill  . ALPRAZolam (XANAX) 0.25 MG tablet Take 0.25 mg by mouth 2 (two) times daily. *May take one additional dose at lunch as needed    . amLODipine (NORVASC) 5 MG tablet Take 1 tablet (5 mg total) by mouth daily. 90 tablet 3  . aspirin EC 81 MG tablet Take 1 tablet  (81 mg total) by mouth daily. (Patient taking differently: Take 162 mg by mouth daily. )    . fexofenadine (ALLEGRA) 180 MG tablet Take 180 mg by mouth daily.    . hydrochlorothiazide (MICROZIDE) 12.5 MG capsule Take 1 capsule (12.5 mg total) by mouth daily. 90 capsule 3  . isosorbide mononitrate (IMDUR) 30 MG 24 hr tablet Take 0.5 tablets (15 mg total) by mouth daily. 30 tablet 11  . Omega-3 Fatty Acids (FISH OIL) 1000 MG CAPS Take 1,000 mg by mouth daily.    . rosuvastatin (CRESTOR) 5 MG tablet TAKE 1 TABLET BY MOUTH ONCE A DAY. (Patient taking differently: TAKE 1 TABLET BY MOUTH ONCE A DAY IN THE EVENING) 90 tablet 3  . timolol (BETIMOL) 0.5 % ophthalmic solution Place 1 drop into the right eye 2 (two) times daily.     . vitamin B-12 (CYANOCOBALAMIN) 1000 MCG tablet Take 1,000 mcg by mouth at bedtime.     . nitroGLYCERIN (NITROSTAT) 0.4 MG SL tablet Place 1 tablet (0.4 mg total) under the tongue every 5 (five) minutes as needed for chest pain. 25 tablet 3   No current facility-administered medications for this visit.     Past Medical History:  Diagnosis Date  . Anxiety   . Blindness of left eye   . CAD (coronary artery disease)    a. s/p CABG in 05/2016 with LIMA-LAD, SVG-D2, SVG-OM, SVG-RCA  . Complication of anesthesia    has gas trapped in her body after  .  DVT (deep venous thrombosis) (HCC)   . Ear problems   . Family history of adverse reaction to anesthesia    daughter has gas trapped in her body postop as well  . Glaucoma   . Hypercholesterolemia   . Hypertension   . Pre-diabetes    pt. told by [facility] staff that she is prediabetic, HgbA1c was 5.4  . Stroke (HCC)    having TIA's    Past Surgical History:  Procedure Laterality Date  . BREAST SURGERY Left    cyst- L breast  . CHOLECYSTECTOMY    . CORONARY ARTERY BYPASS GRAFT N/A 06/09/2016   Procedure: CORONARY ARTERY BYPASS GRAFTING (CABG) times 4 using the left internal mammary artery and bilateral thigh greater saphenous  veins harvested endoscopically. LIMA to LAD, SVG to DIAGONAL 2, SVG to OM, SVG to RCA;  Surgeon: Delight Ovens, MD;  Location: [facility] OR;  Service: Open Heart Surgery;  Laterality: N/A;  . ENDARTERECTOMY Left 05/29/2016   Procedure: LEFT CAROTID ENDARTERECTOMY WITH PATCH ANGIOPLASTY;  Surgeon: Larina Earthly, MD;  Location: [facility] OR;  Service: Vascular;  Laterality: Left;  . EYE SURGERY Right    cataract  . LEFT HEART CATH AND CORONARY ANGIOGRAPHY N/A 06/07/2016   Procedure: Left Heart Cath and Coronary Angiography;  Surgeon: Iran Ouch, MD;  Location: [facility] INVASIVE CV LAB;  Service: Cardiovascular;  Laterality: N/A;  . TEE WITHOUT CARDIOVERSION N/A 06/09/2016   Procedure: TRANSESOPHAGEAL ECHOCARDIOGRAM (TEE);  Surgeon: Delight Ovens, MD;  Location: [facility] OR;  Service: Open Heart Surgery;  Laterality: N/A;    Social History   Socioeconomic History  . Marital status: Divorced    Spouse name: Not on file  . Number of children: 4  . Years of education: 10  . Highest education level: Not on file  Occupational History    Comment: retired  Engineer, production  . Financial resource strain: Not on file  . Food insecurity:    Worry: Not on file    Inability: Not on file  . Transportation needs:    Medical: Not on file    Non-medical: Not on file  Tobacco Use  . Smoking status: Never Smoker  . Smokeless tobacco: Never Used  Substance and Sexual Activity  . Alcohol use: No  . Drug use: No  . Sexual activity: Not Currently  Lifestyle  . Physical activity:    Days per week: Not on file    Minutes per session: Not on file  . Stress: Not on file  Relationships  . Social connections:    Talks on phone: Not on file    Gets together: Not on file    Attends religious service: Not on file    Active member of club or organization: Not on file    Attends meetings of clubs or organizations: Not on file    Relationship status: Not on file  . Intimate partner violence:    Fear of current or ex partner:  Not on file    Emotionally abused: Not on file    Physically abused: Not on file    Forced sexual activity: Not on file  Other Topics Concern  . Not on file  Social History Narrative   Lives with daughter   No caffeine since Dec 2017     Vitals:   01/29/18 1451  BP: 122/64  Pulse: 98  SpO2: 98%  Weight: 129 lb (58.5 kg)  Height: 4' 7.5" (1.41 m)    Wt Readings from  Last 3 Encounters:  01/29/18 129 lb (58.5 kg)  10/14/17 137 lb (62.1 kg)  09/25/17 137 lb (62.1 kg)     PHYSICAL EXAM General: NAD HEENT: Left eye blindness. Neck: No JVD, no thyromegaly. Lungs: Clear to auscultation bilaterally with normal respiratory effort. CV: Bradycardiac (54 bpm), regular rhythm, normal S1/S2, no S3/S4, no murmur. No pretibial or periankle edema.  Abdomen: Soft, nontender, no distention.  Neurologic: Alert and oriented.  Psych: Normal affect. Skin: Normal. Musculoskeletal: No gross deformities.    ECG: Reviewed above under Subjective   Labs: Lab Results  Component Value Date/Time   K 3.3 (L) 10/14/2017 08:47 PM   BUN 10 10/14/2017 08:47 PM   CREATININE 0.64 10/14/2017 08:47 PM   CREATININE 0.88 04/30/2017 10:23 AM   ALT 33 10/14/2017 08:47 PM   HGB 13.7 10/14/2017 08:47 PM     Lipids: Lab Results  Component Value Date/Time   LDLCALC 87 12/29/2016 09:55 AM   CHOL 157 12/29/2016 09:55 AM   TRIG 97 12/29/2016 09:55 AM   HDL 51 12/29/2016 09:55 AM       ASSESSMENT AND PLAN: 1.  Coronary artery disease: Status post CABG in March 2018 withLIMA-LAD, SVG-D2, SVG-OM, SVG-RCA.  Symptomatically stable on low-dose Imdur 15 mg daily.  Continue aspirin and statin.  2.  Lower extremity edema: Stable on low-dose HCTZ 12.5 mg daily.  3.  Bradycardia: Stable since discontinuation of beta-blockers.  Heart rate is 54 bpm today by auscultation.  4.  Hypertension: Blood pressure is normal.  No changes to therapy.  5.  Hyperlipidemia: I will obtain a copy of lipids from PCP.  Most  recent lipids from October 2018 reviewed above.  She is on rosuvastatin 5 mg daily as she has been intolerant to higher intensity statin therapy.     6.  Carotid artery disease: History of left carotid endarterectomy.  Remains on aspirin and statin therapy.   Disposition: Follow up 6 months   Prentice Docker, M.D., F.A.C.C.

## 2018-03-21 ENCOUNTER — Other Ambulatory Visit: Payer: Self-pay | Admitting: Adult Health

## 2018-03-26 DIAGNOSIS — H2511 Age-related nuclear cataract, right eye: Secondary | ICD-10-CM | POA: Diagnosis not present

## 2018-03-26 DIAGNOSIS — H25011 Cortical age-related cataract, right eye: Secondary | ICD-10-CM | POA: Diagnosis not present

## 2018-03-26 DIAGNOSIS — E119 Type 2 diabetes mellitus without complications: Secondary | ICD-10-CM | POA: Diagnosis not present

## 2018-03-26 DIAGNOSIS — H16402 Unspecified corneal neovascularization, left eye: Secondary | ICD-10-CM | POA: Diagnosis not present

## 2018-03-26 DIAGNOSIS — H401131 Primary open-angle glaucoma, bilateral, mild stage: Secondary | ICD-10-CM | POA: Diagnosis not present

## 2018-03-26 DIAGNOSIS — Z961 Presence of intraocular lens: Secondary | ICD-10-CM | POA: Diagnosis not present

## 2018-04-08 ENCOUNTER — Telehealth: Payer: Self-pay | Admitting: Cardiovascular Disease

## 2018-04-08 NOTE — Telephone Encounter (Signed)
Typically does not need to be held for cataract surgery.

## 2018-04-08 NOTE — Telephone Encounter (Signed)
Pt states that she is having cataract surgery. Will forward to Dr. Purvis Sheffield.

## 2018-04-08 NOTE — Telephone Encounter (Signed)
Patient states she is having eye surgery on Wednesday and is asking what she needs to do about her ASAj. / tg

## 2018-04-08 NOTE — Telephone Encounter (Signed)
Pt notified and voiced understanding 

## 2018-04-10 DIAGNOSIS — H2511 Age-related nuclear cataract, right eye: Secondary | ICD-10-CM | POA: Diagnosis not present

## 2018-04-10 DIAGNOSIS — H25011 Cortical age-related cataract, right eye: Secondary | ICD-10-CM | POA: Diagnosis not present

## 2018-05-07 DIAGNOSIS — E119 Type 2 diabetes mellitus without complications: Secondary | ICD-10-CM | POA: Diagnosis not present

## 2018-05-08 DIAGNOSIS — Z0100 Encounter for examination of eyes and vision without abnormal findings: Secondary | ICD-10-CM | POA: Diagnosis not present

## 2018-06-10 ENCOUNTER — Other Ambulatory Visit: Payer: Self-pay | Admitting: Student

## 2018-06-20 ENCOUNTER — Telehealth: Payer: Self-pay

## 2018-06-20 NOTE — Telephone Encounter (Signed)
   Cardiac Questionnaire:    Since your last visit or hospitalization:    1. Have you been having new or worsening chest pain? no   2. Have you been having new or worsening shortness of breath? no 3. Have you been having new or worsening leg swelling, wt gain, or increase in abdominal girth (pants fitting more tightly)? no   4. Have you had any passing out spells? No    Pt has no car, no cell phone, wants tele apt.Is doing well, does not need refills

## 2018-07-02 ENCOUNTER — Telehealth (INDEPENDENT_AMBULATORY_CARE_PROVIDER_SITE_OTHER): Payer: Medicare HMO | Admitting: Cardiovascular Disease

## 2018-07-02 ENCOUNTER — Ambulatory Visit: Payer: Medicare HMO | Admitting: Cardiovascular Disease

## 2018-07-02 ENCOUNTER — Encounter: Payer: Self-pay | Admitting: Cardiovascular Disease

## 2018-07-02 DIAGNOSIS — I25708 Atherosclerosis of coronary artery bypass graft(s), unspecified, with other forms of angina pectoris: Secondary | ICD-10-CM | POA: Diagnosis not present

## 2018-07-02 DIAGNOSIS — R600 Localized edema: Secondary | ICD-10-CM | POA: Diagnosis not present

## 2018-07-02 DIAGNOSIS — I10 Essential (primary) hypertension: Secondary | ICD-10-CM | POA: Diagnosis not present

## 2018-07-02 DIAGNOSIS — Z7982 Long term (current) use of aspirin: Secondary | ICD-10-CM | POA: Diagnosis not present

## 2018-07-02 DIAGNOSIS — Z9889 Other specified postprocedural states: Secondary | ICD-10-CM | POA: Diagnosis not present

## 2018-07-02 DIAGNOSIS — F419 Anxiety disorder, unspecified: Secondary | ICD-10-CM

## 2018-07-02 DIAGNOSIS — E785 Hyperlipidemia, unspecified: Secondary | ICD-10-CM | POA: Diagnosis not present

## 2018-07-02 DIAGNOSIS — Z951 Presence of aortocoronary bypass graft: Secondary | ICD-10-CM

## 2018-07-02 DIAGNOSIS — R079 Chest pain, unspecified: Secondary | ICD-10-CM | POA: Diagnosis not present

## 2018-07-02 DIAGNOSIS — Z79899 Other long term (current) drug therapy: Secondary | ICD-10-CM

## 2018-07-02 DIAGNOSIS — R001 Bradycardia, unspecified: Secondary | ICD-10-CM

## 2018-07-02 NOTE — Patient Instructions (Signed)
Medication Instructions: Your physician recommends that you continue on your current medications as directed. Please refer to the Current Medication list given to you today.   Labwork: None  Procedures/Testing: None today  Follow-Up: 1 year with Dr.Koneswaran  Any Additional Special Instructions Will Be Listed Below (If Applicable).     If you need a refill on your cardiac medications before your next appointment, please call your pharmacy.

## 2018-07-02 NOTE — Progress Notes (Signed)
Virtual Visit via Telephone Note   This visit type was conducted due to national recommendations for restrictions regarding the COVID-19 Pandemic (e.g. social distancing) in an effort to limit this patient's exposure and mitigate transmission in our community.  Due to her co-morbid illnesses, this patient is at least at moderate risk for complications without adequate follow up.  This format is felt to be most appropriate for this patient at this time.  The patient did not have access to video technology/had technical difficulties with video requiring transitioning to audio format only (telephone).  All issues noted in this document were discussed and addressed.  No physical exam could be performed with this format.  Please refer to the patient's chart for her  consent to telehealth for [facility].   Evaluation Performed:  Follow-up visit  Date:  07/02/2018   ID:  Sheryl Carter, Sheryl Carter 1940-05-21, MRN [id number]  Patient Location: Home  Provider Location: Home  PCP:  Elfredia Nevins, MD  Cardiologist:  Prentice Docker, MD  Electrophysiologist:  None   Chief Complaint:  CAD  History of Present Illness:    Sheryl Carter is a 78 y.o. female who presents via audio/video conferencing for a telehealth visit today.    Past medical history includes coronary artery disease with a history of four-vessel CABG in March 2018 with LIMA-LAD, SVG-D2, SVG-OM, SVG-RCA.She also has hypertension and hyperlipidemia.She has a history of left carotid endarterectomy as well.  She hasn't left her house in 3.5 weeks. Her son and daughter bring her groceries.  Denies any cardiac problems. Certain foods cause belching and gas buildup. She denies exertional chest pain and dyspnea. She also denies palpitations. She gets dizzy only when she has sinus problems.  Watching the news has been making her anxious so she only watches for 15 minutes.  She eats primarily baked foods and avoids fried foods.  The patient does not have symptoms concerning for COVID-19 infection (fever, chills, cough, or new shortness of breath).   Soc Hx: Her granddaughter, Lanora Manis, will be graduated with degrees in [place] and history from [employer] in December 2019.  Past Medical History:  Diagnosis Date  . Anxiety   . Blindness of left eye   . CAD (coronary artery disease)    a. s/p CABG in 05/2016 with LIMA-LAD, SVG-D2, SVG-OM, SVG-RCA  . Complication of anesthesia    has gas trapped in her body after  . DVT (deep venous thrombosis) (HCC)   . Ear problems   . Family history of adverse reaction to anesthesia    daughter has gas trapped in her body postop as well  . Glaucoma   . Hypercholesterolemia   . Hypertension   . Pre-diabetes    pt. told by [facility] staff that she is prediabetic, HgbA1c was 5.4  . Stroke (HCC)    having TIA's   Past Surgical History:  Procedure Laterality Date  . BREAST SURGERY Left    cyst- L breast  . CHOLECYSTECTOMY    . CORONARY ARTERY BYPASS GRAFT N/A 06/09/2016   Procedure: CORONARY ARTERY BYPASS GRAFTING (CABG) times 4 using the left internal mammary artery and bilateral thigh greater saphenous veins harvested endoscopically. LIMA to LAD, SVG to DIAGONAL 2, SVG to OM, SVG to RCA;  Surgeon: Delight Ovens, MD;  Location: [facility] OR;  Service: Open Heart Surgery;  Laterality: N/A;  . ENDARTERECTOMY Left 05/29/2016   Procedure: LEFT CAROTID ENDARTERECTOMY WITH PATCH ANGIOPLASTY;  Surgeon: Larina Earthly, MD;  Location: [facility]  OR;  Service: Vascular;  Laterality: Left;  . EYE SURGERY Right    cataract  . LEFT HEART CATH AND CORONARY ANGIOGRAPHY N/A 06/07/2016   Procedure: Left Heart Cath and Coronary Angiography;  Surgeon: Iran OuchMuhammad A Arida, MD;  Location: [facility] INVASIVE CV LAB;  Service: Cardiovascular;  Laterality: N/A;  . TEE WITHOUT CARDIOVERSION N/A 06/09/2016   Procedure: TRANSESOPHAGEAL ECHOCARDIOGRAM (TEE);  Surgeon: Delight OvensEdward B Gerhardt, MD;  [facility];  Service: Open Heart Surgery;   Laterality: N/A;     Current Meds  Medication Sig  . ALPRAZolam (XANAX) 0.25 MG tablet Take 0.25 mg by mouth 2 (two) times daily. *May take one additional dose at lunch as needed  . amLODipine (NORVASC) 5 MG tablet Take 1 tablet (5 mg total) by mouth daily.  Marland Kitchen. aspirin EC 81 MG tablet Take 81 mg by mouth daily.  . fexofenadine (ALLEGRA) 180 MG tablet Take 180 mg by mouth daily.  . hydrochlorothiazide (MICROZIDE) 12.5 MG capsule TAKE ONE CAPSULE BY MOUTH DAILY.  . isosorbide mononitrate (IMDUR) 30 MG 24 hr tablet Take 0.5 tablets (15 mg total) by mouth daily.  . nitroGLYCERIN (NITROSTAT) 0.4 MG SL tablet Place 1 tablet (0.4 mg total) under the tongue every 5 (five) minutes as needed for chest pain.  . Omega-3 Fatty Acids (FISH OIL) 1000 MG CAPS Take 1,000 mg by mouth daily.  . rosuvastatin (CRESTOR) 5 MG tablet TAKE 1 TABLET BY MOUTH ONCE A DAY IN THE EVENING  . timolol (BETIMOL) 0.5 % ophthalmic solution Place 1 drop into the right eye 2 (two) times daily.   . vitamin B-12 (CYANOCOBALAMIN) 1000 MCG tablet Take 1,000 mcg by mouth at bedtime.      Allergies:   Other; Prednisone; Statins; Erythromycin; and Tylenol [acetaminophen]   Social History   Tobacco Use  . Smoking status: Never Smoker  . Smokeless tobacco: Never Used  Substance Use Topics  . Alcohol use: No  . Drug use: No     Family Hx: The patient's family history includes Heart disease in her sister and son.  ROS:   Please see the history of present illness.     All other systems reviewed and are negative.   Prior CV studies:   The following studies were reviewed today:  Echocardiogram in March 2018 demonstrated normal left ventricular systolic function, LVEF 50-55%, with akinesis of the inferior myocardium and hypokinesis of the mid inferoseptal myocardium.  Carotid Dopplers in October 2018 demonstrated a patent left carotid endarterectomy in 1-39% right internal carotid artery stenosis.  Labs/Other Tests and  Data Reviewed:      Recent Labs: 10/14/2017: ALT 33; BUN 10; Creatinine, Ser 0.64; Hemoglobin 13.7; Platelets 205; Potassium 3.3; Sodium 131   Recent Lipid Panel Lab Results  Component Value Date/Time   CHOL 157 12/29/2016 09:55 AM   TRIG 97 12/29/2016 09:55 AM   HDL 51 12/29/2016 09:55 AM   CHOLHDL 5.7 (H) 09/29/2016 08:59 AM   LDLCALC 87 12/29/2016 09:55 AM    Wt Readings from Last 3 Encounters:  07/02/18 122 lb (55.3 kg)  01/29/18 129 lb (58.5 kg)  10/14/17 137 lb (62.1 kg)     Objective:    Vital Signs:  Ht 4' 7.5" (1.41 m)   Wt 122 lb (55.3 kg)   BMI 27.85 kg/m    Phone visit  ASSESSMENT & PLAN:    1.  Coronary artery disease: Status post CABG in March 2018 withLIMA-LAD, SVG-D2, SVG-OM, SVG-RCA.  Symptomatically stable on low-dose Imdur  15 mg daily.  Continue aspirin and low dose statin. Beta blockers led to bradycardia.  2.  Lower extremity edema: Stable on low-dose HCTZ 12.5 mg daily.  3.  Bradycardia: Stable since discontinuation of beta-blockers.    4.  Hypertension: Blood pressure will need continued monitoring. She checks it at home.   5.  Hyperlipidemia: I will obtain a copy of lipids from PCP.  She is on rosuvastatin 5 mg daily as she has been intolerant to higher intensity statin therapy.     6.  Carotid artery disease: History of left carotid endarterectomy.  Remains on aspirin and statin therapy.    COVID-19 Education: The signs and symptoms of COVID-19 were discussed with the patient and how to seek care for testing (follow up with PCP or arrange E-visit).  The importance of social distancing was discussed today.  Time:   Today, I have spent 15 minutes with the patient with telehealth technology discussing the above problems.     Medication Adjustments/Labs and Tests Ordered: Current medicines are reviewed at length with the patient today.  Concerns regarding medicines are outlined above.  Tests Ordered: No orders of the defined types  were placed in this encounter.  Medication Changes: No orders of the defined types were placed in this encounter.   Disposition:  Follow up in 1 year(s)  Signed, Prentice Docker, MD  07/02/2018 3:14 PM    [facility]

## 2018-08-27 IMAGING — CT CT HEAD W/O CM
3 series · 15 of 47 positions shown, 18 images · non-contrast
Comparison: MRI 07/28/2016, head CT 07/28/2016

CLINICAL DATA: Numbness to the tongue and throat, both arms and
both legs

EXAM:
CT HEAD WITHOUT CONTRAST
TECHNIQUE: Contiguous axial images were obtained from the base of the skull
through the vertex without intravenous contrast.

[Series 2: head wo · axial · 0.42mm/px · z∈[+299,+424]mm · 9 of 30 slices shown, 12 images]
[im 3/30  brain]
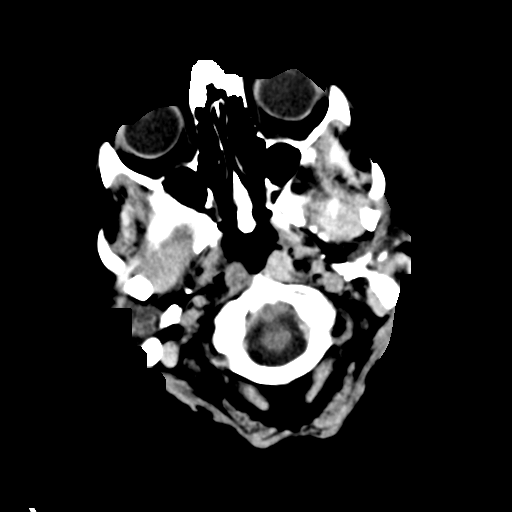
[im 3/30  bone]
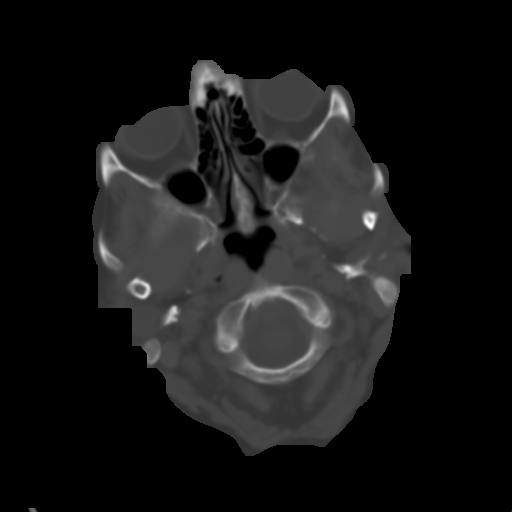
[im 6/30  brain]
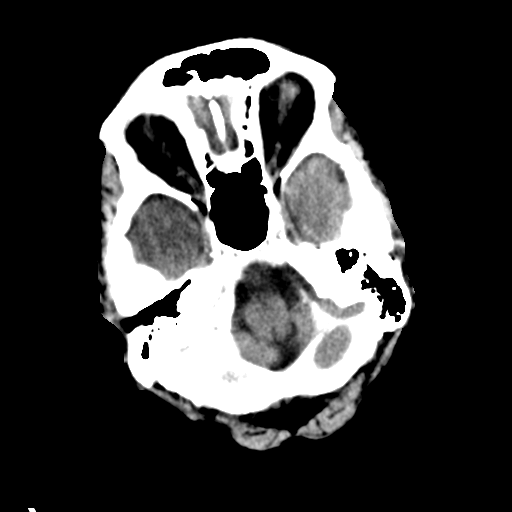
[im 9/30  brain]
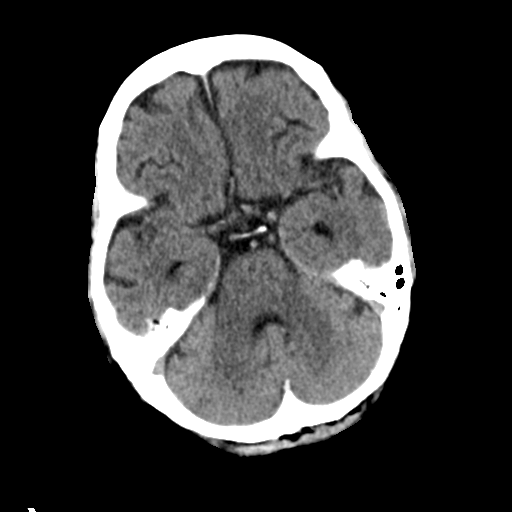
[im 12/30  brain]
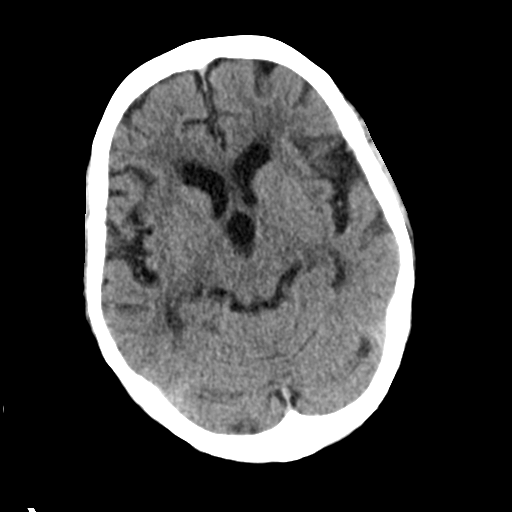
[im 16/30  brain]
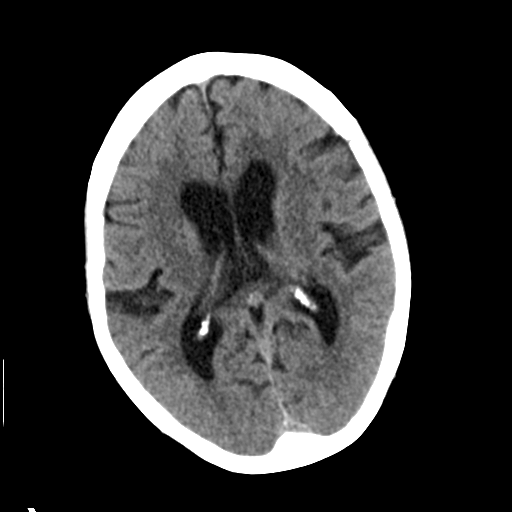
[im 16/30  bone]
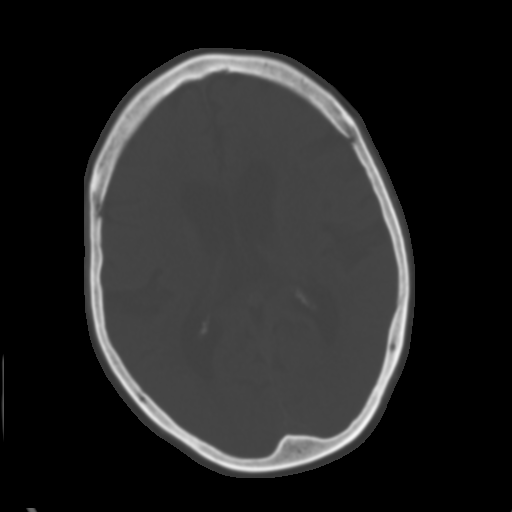
[im 19/30  brain]
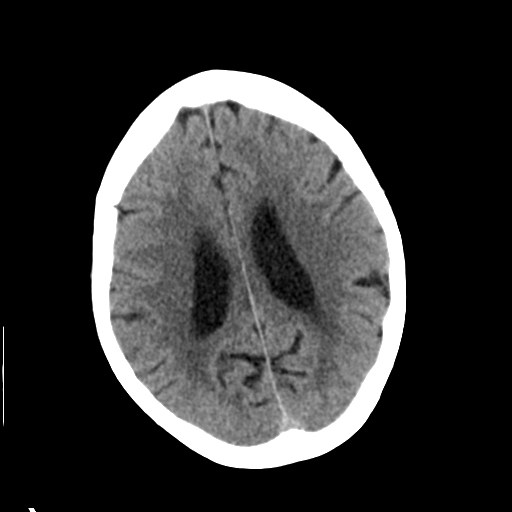
[im 22/30  brain]
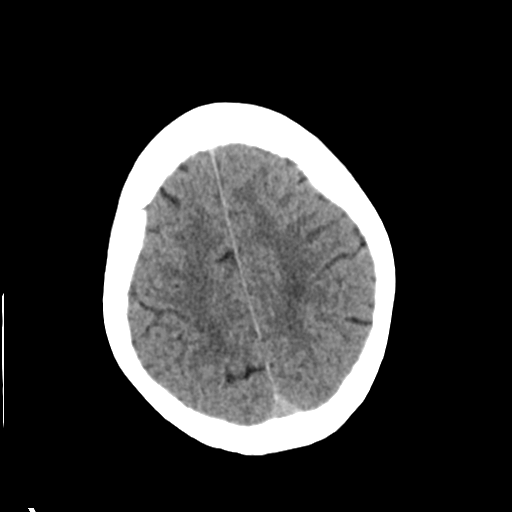
[im 25/30  brain]
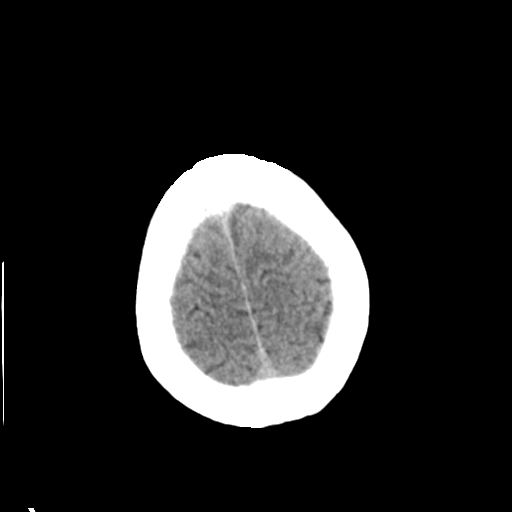
[im 28/30  brain]
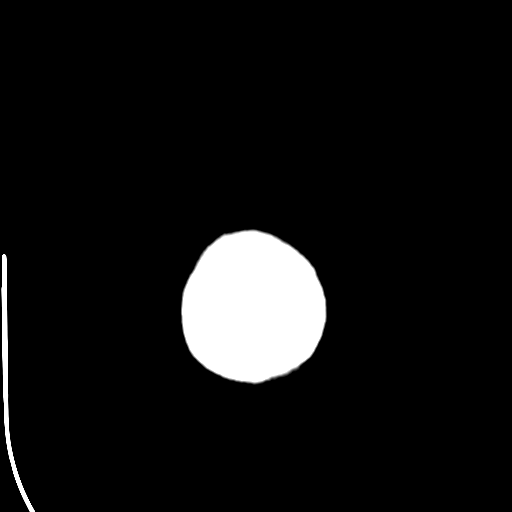
[im 28/30  bone]
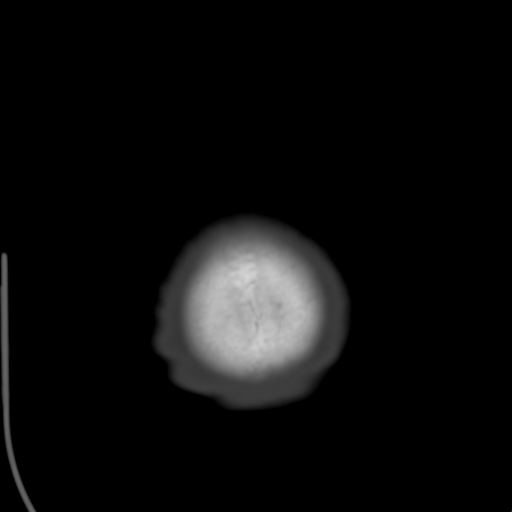

[Series 4: coronal soft tissue · coronal · 0.31mm/px · 3 of 67 slices shown]
[im 23/67  brain]
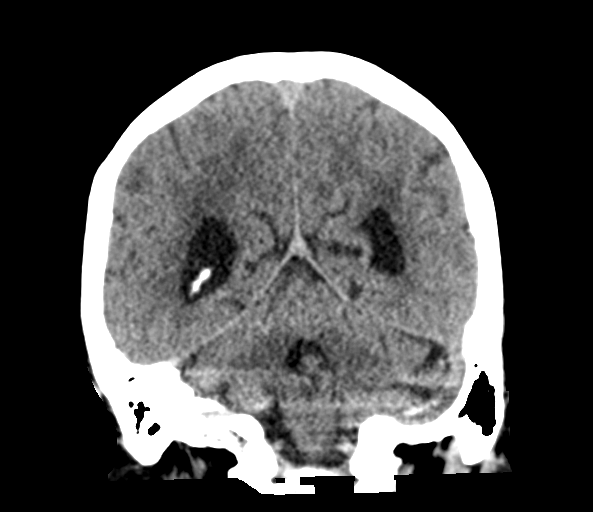
[im 30/67  brain]
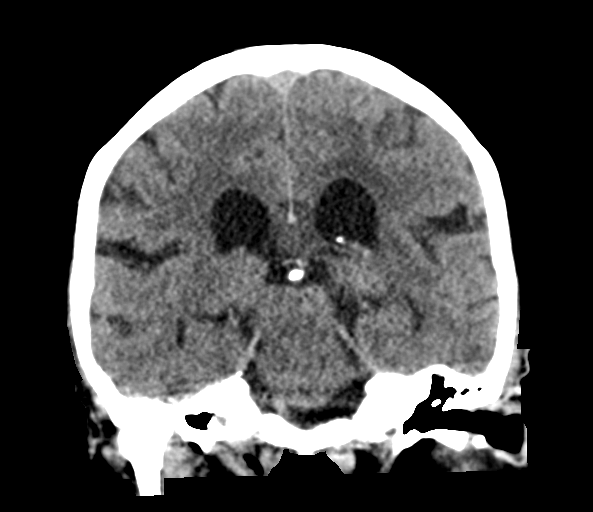
[im 37/67  brain]
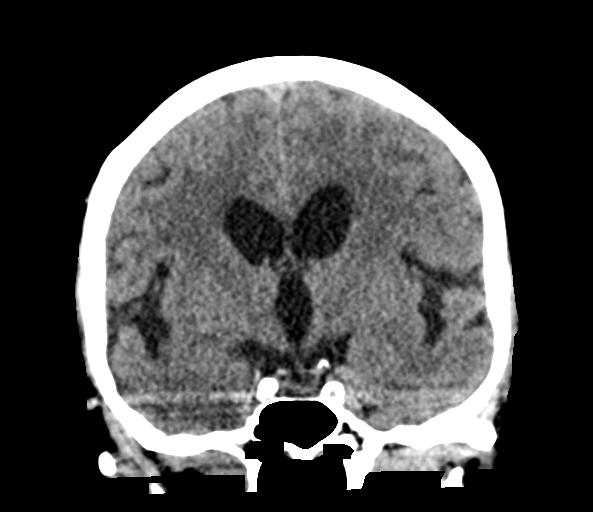

[Series 5: sagittal soft tissue · sagittal · 0.31mm/px · 3 of 56 slices shown]
[im 19/56  brain]
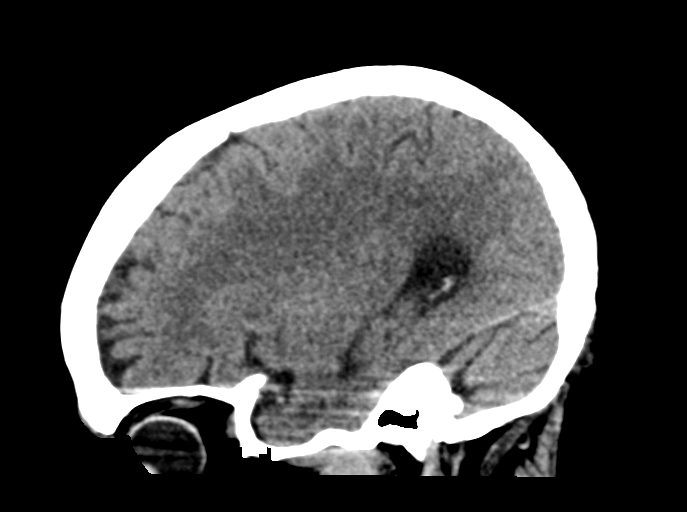
[im 28/56  brain]
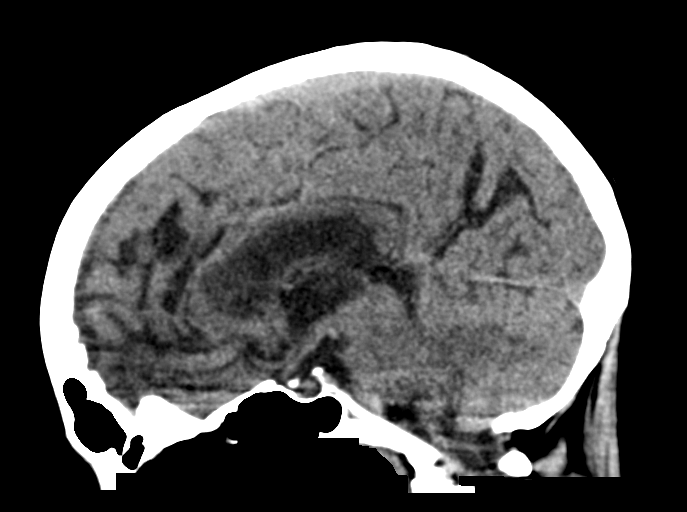
[im 37/56  brain]
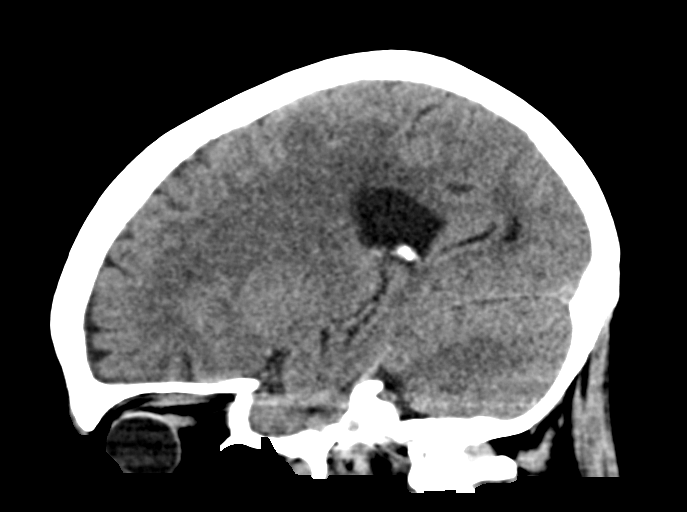

[15 of 47 positions shown; findings below may reference images not displayed]

FINDINGS: Brain: No acute territorial infarction, hemorrhage, or intracranial
mass. Old right cerebellar infarct. Mild atrophy. Mild small vessel
ischemic changes of the white matter. Stable ventricle size.

Vascular: No hyperdense vessels.  Carotid vascular calcification

Skull: Normal. Negative for fracture or focal lesion.

Sinuses/Orbits: No acute finding.

Other: None
IMPRESSION: 1. No CT evidence for acute intracranial abnormality.
2. Atrophy and small vessel ischemic changes of the white matter.
Old right cerebellar infarct.

## 2018-09-23 ENCOUNTER — Other Ambulatory Visit: Payer: Self-pay | Admitting: Student

## 2018-09-23 MED ORDER — HYDROCHLOROTHIAZIDE 12.5 MG PO CAPS: 12.5000 mg | ORAL_CAPSULE | Freq: Every day | ORAL | 3 refills | Status: DC

## 2018-09-23 NOTE — Telephone Encounter (Signed)
Needs refill on hydrochlorothiazide sent to [facility]. / tg

## 2018-09-23 NOTE — Telephone Encounter (Signed)
done

## 2018-10-22 ENCOUNTER — Telehealth: Payer: Self-pay | Admitting: Cardiovascular Disease

## 2018-10-22 NOTE — Telephone Encounter (Signed)
isosorbide mononitrate (IMDUR) 30 MG 24 hr tablet  Needs to know if she is to still take medication and if so she needs refills

## 2018-10-23 MED ORDER — ISOSORBIDE MONONITRATE ER 30 MG PO TB24: 15.0000 mg | ORAL_TABLET | Freq: Every day | ORAL | 3 refills | Status: DC

## 2018-10-23 NOTE — Telephone Encounter (Signed)
New message     *STAT* If patient is at the pharmacy, call can be transferred to refill team.   1. Which medications need to be refilled? (please list name of each medication and dose if known) isosorbide mononitrate (IMDUR) 30 MG 24 hr tablet  2. Which pharmacy/location (including street and city if local pharmacy) is medication to be sent to? [facility]  3. Do they need a 30 day or 90 day supply? [place]

## 2018-10-23 NOTE — Telephone Encounter (Signed)
Refill complete 

## 2018-11-25 DIAGNOSIS — H409 Unspecified glaucoma: Secondary | ICD-10-CM | POA: Diagnosis not present

## 2018-11-25 DIAGNOSIS — E782 Mixed hyperlipidemia: Secondary | ICD-10-CM | POA: Diagnosis not present

## 2018-11-25 DIAGNOSIS — I10 Essential (primary) hypertension: Secondary | ICD-10-CM | POA: Diagnosis not present

## 2018-11-25 DIAGNOSIS — I2510 Atherosclerotic heart disease of native coronary artery without angina pectoris: Secondary | ICD-10-CM | POA: Diagnosis not present

## 2018-12-25 DIAGNOSIS — I10 Essential (primary) hypertension: Secondary | ICD-10-CM | POA: Diagnosis not present

## 2018-12-25 DIAGNOSIS — H401111 Primary open-angle glaucoma, right eye, mild stage: Secondary | ICD-10-CM | POA: Diagnosis not present

## 2018-12-25 DIAGNOSIS — H409 Unspecified glaucoma: Secondary | ICD-10-CM | POA: Diagnosis not present

## 2018-12-25 DIAGNOSIS — E7849 Other hyperlipidemia: Secondary | ICD-10-CM | POA: Diagnosis not present

## 2019-01-02 ENCOUNTER — Encounter: Payer: Self-pay | Admitting: Cardiovascular Disease

## 2019-01-02 DIAGNOSIS — E7849 Other hyperlipidemia: Secondary | ICD-10-CM | POA: Diagnosis not present

## 2019-01-02 DIAGNOSIS — F419 Anxiety disorder, unspecified: Secondary | ICD-10-CM | POA: Diagnosis not present

## 2019-01-02 DIAGNOSIS — Z0000 Encounter for general adult medical examination without abnormal findings: Secondary | ICD-10-CM | POA: Diagnosis not present

## 2019-01-02 DIAGNOSIS — E119 Type 2 diabetes mellitus without complications: Secondary | ICD-10-CM | POA: Diagnosis not present

## 2019-01-02 DIAGNOSIS — K219 Gastro-esophageal reflux disease without esophagitis: Secondary | ICD-10-CM | POA: Diagnosis not present

## 2019-01-02 DIAGNOSIS — Z6827 Body mass index (BMI) 27.0-27.9, adult: Secondary | ICD-10-CM | POA: Diagnosis not present

## 2019-01-02 DIAGNOSIS — I10 Essential (primary) hypertension: Secondary | ICD-10-CM | POA: Diagnosis not present

## 2019-03-14 ENCOUNTER — Other Ambulatory Visit: Payer: Self-pay | Admitting: Cardiovascular Disease

## 2019-03-17 ENCOUNTER — Emergency Department (HOSPITAL_COMMUNITY): Payer: Medicare HMO

## 2019-03-17 ENCOUNTER — Ambulatory Visit (INDEPENDENT_AMBULATORY_CARE_PROVIDER_SITE_OTHER)
Admission: EM | Admit: 2019-03-17 | Discharge: 2019-03-17 | Disposition: A | Discharge status: Home / Self Care | Payer: Medicare HMO | Source: Home / Self Care

## 2019-03-17 ENCOUNTER — Other Ambulatory Visit: Payer: Self-pay

## 2019-03-17 ENCOUNTER — Encounter (HOSPITAL_COMMUNITY): Payer: Self-pay

## 2019-03-17 ENCOUNTER — Observation Stay (HOSPITAL_COMMUNITY)
Admission: EM | Admit: 2019-03-17 | Discharge: 2019-03-18 | Disposition: A | Discharge status: Home / Self Care | Payer: Medicare HMO | Attending: Family Medicine | Admitting: Family Medicine

## 2019-03-17 DIAGNOSIS — Z888 Allergy status to other drugs, medicaments and biological substances status: Secondary | ICD-10-CM | POA: Insufficient documentation

## 2019-03-17 DIAGNOSIS — H409 Unspecified glaucoma: Secondary | ICD-10-CM | POA: Diagnosis not present

## 2019-03-17 DIAGNOSIS — Z886 Allergy status to analgesic agent status: Secondary | ICD-10-CM | POA: Diagnosis not present

## 2019-03-17 DIAGNOSIS — E785 Hyperlipidemia, unspecified: Secondary | ICD-10-CM | POA: Diagnosis not present

## 2019-03-17 DIAGNOSIS — G4489 Other headache syndrome: Secondary | ICD-10-CM | POA: Diagnosis not present

## 2019-03-17 DIAGNOSIS — I2510 Atherosclerotic heart disease of native coronary artery without angina pectoris: Secondary | ICD-10-CM | POA: Insufficient documentation

## 2019-03-17 DIAGNOSIS — R2681 Unsteadiness on feet: Secondary | ICD-10-CM | POA: Diagnosis not present

## 2019-03-17 DIAGNOSIS — R531 Weakness: Secondary | ICD-10-CM

## 2019-03-17 DIAGNOSIS — Z8249 Family history of ischemic heart disease and other diseases of the circulatory system: Secondary | ICD-10-CM | POA: Diagnosis not present

## 2019-03-17 DIAGNOSIS — Z8673 Personal history of transient ischemic attack (TIA), and cerebral infarction without residual deficits: Secondary | ICD-10-CM | POA: Insufficient documentation

## 2019-03-17 DIAGNOSIS — R0789 Other chest pain: Secondary | ICD-10-CM | POA: Diagnosis not present

## 2019-03-17 DIAGNOSIS — F411 Generalized anxiety disorder: Secondary | ICD-10-CM | POA: Insufficient documentation

## 2019-03-17 DIAGNOSIS — Z7982 Long term (current) use of aspirin: Secondary | ICD-10-CM | POA: Insufficient documentation

## 2019-03-17 DIAGNOSIS — I6782 Cerebral ischemia: Secondary | ICD-10-CM | POA: Diagnosis not present

## 2019-03-17 DIAGNOSIS — I4891 Unspecified atrial fibrillation: Secondary | ICD-10-CM | POA: Diagnosis not present

## 2019-03-17 DIAGNOSIS — H5462 Unqualified visual loss, left eye, normal vision right eye: Secondary | ICD-10-CM | POA: Insufficient documentation

## 2019-03-17 DIAGNOSIS — R9431 Abnormal electrocardiogram [ECG] [EKG]: Secondary | ICD-10-CM | POA: Diagnosis not present

## 2019-03-17 DIAGNOSIS — Z881 Allergy status to other antibiotic agents status: Secondary | ICD-10-CM | POA: Insufficient documentation

## 2019-03-17 DIAGNOSIS — Z951 Presence of aortocoronary bypass graft: Secondary | ICD-10-CM | POA: Insufficient documentation

## 2019-03-17 DIAGNOSIS — R519 Headache, unspecified: Secondary | ICD-10-CM | POA: Diagnosis not present

## 2019-03-17 DIAGNOSIS — E7800 Pure hypercholesterolemia, unspecified: Secondary | ICD-10-CM | POA: Insufficient documentation

## 2019-03-17 DIAGNOSIS — R079 Chest pain, unspecified: Secondary | ICD-10-CM | POA: Diagnosis not present

## 2019-03-17 DIAGNOSIS — E871 Hypo-osmolality and hyponatremia: Secondary | ICD-10-CM | POA: Diagnosis not present

## 2019-03-17 DIAGNOSIS — R001 Bradycardia, unspecified: Secondary | ICD-10-CM | POA: Diagnosis not present

## 2019-03-17 DIAGNOSIS — Z66 Do not resuscitate: Secondary | ICD-10-CM | POA: Diagnosis not present

## 2019-03-17 DIAGNOSIS — I10 Essential (primary) hypertension: Secondary | ICD-10-CM | POA: Diagnosis not present

## 2019-03-17 DIAGNOSIS — Z20828 Contact with and (suspected) exposure to other viral communicable diseases: Secondary | ICD-10-CM | POA: Diagnosis not present

## 2019-03-17 DIAGNOSIS — Z86718 Personal history of other venous thrombosis and embolism: Secondary | ICD-10-CM | POA: Insufficient documentation

## 2019-03-17 DIAGNOSIS — E876 Hypokalemia: Secondary | ICD-10-CM | POA: Diagnosis present

## 2019-03-17 DIAGNOSIS — R110 Nausea: Secondary | ICD-10-CM | POA: Diagnosis not present

## 2019-03-17 DIAGNOSIS — Z79899 Other long term (current) drug therapy: Secondary | ICD-10-CM | POA: Diagnosis not present

## 2019-03-17 DIAGNOSIS — I252 Old myocardial infarction: Secondary | ICD-10-CM | POA: Insufficient documentation

## 2019-03-17 LAB — CBC WITH DIFFERENTIAL/PLATELET
Abs Immature Granulocytes: 0.01 10*3/uL (ref 0.00–0.07)
Basophils Absolute: 0 10*3/uL (ref 0.0–0.1)
Basophils Relative: 1 %
Eosinophils Absolute: 0.1 10*3/uL (ref 0.0–0.5)
Eosinophils Relative: 1 %
HCT: 37 % (ref 36.0–46.0)
Hemoglobin: 13.1 g/dL (ref 12.0–15.0)
Immature Granulocytes: 0 %
Lymphocytes Relative: 29 %
Lymphs Abs: 1.8 10*3/uL (ref 0.7–4.0)
MCH: 33 pg (ref 26.0–34.0)
MCHC: 35.4 g/dL (ref 30.0–36.0)
MCV: 93.2 fL (ref 80.0–100.0)
Monocytes Absolute: 0.3 10*3/uL (ref 0.1–1.0)
Monocytes Relative: 5 %
Neutro Abs: 4 10*3/uL (ref 1.7–7.7)
Neutrophils Relative %: 64 %
Platelets: 169 10*3/uL (ref 150–400)
RBC: 3.97 MIL/uL (ref 3.87–5.11)
RDW: 11.6 % (ref 11.5–15.5)
WBC: 6.2 10*3/uL (ref 4.0–10.5)
nRBC: 0 % (ref 0.0–0.2)

## 2019-03-17 LAB — BASIC METABOLIC PANEL
Anion gap: 10 (ref 5–15)
BUN: 9 mg/dL (ref 8–23)
CO2: 25 mmol/L (ref 22–32)
Calcium: 8.8 mg/dL — ABNORMAL LOW (ref 8.9–10.3)
Chloride: 91 mmol/L — ABNORMAL LOW (ref 98–111)
Creatinine, Ser: 0.53 mg/dL (ref 0.44–1.00)
GFR calc Af Amer: >60 mL/min (ref >60)
GFR calc non Af Amer: >60 mL/min (ref >60)
Glucose, Bld: 117 mg/dL — ABNORMAL HIGH (ref 70–99)
Potassium: 3.4 mmol/L — ABNORMAL LOW (ref 3.5–5.1)
Sodium: 126 mmol/L — ABNORMAL LOW (ref 135–145)

## 2019-03-17 LAB — URINALYSIS, ROUTINE W REFLEX MICROSCOPIC
Bilirubin Urine: NEGATIVE
Glucose, UA: NEGATIVE mg/dL
Hgb urine dipstick: NEGATIVE
Ketones, ur: NEGATIVE mg/dL
Leukocytes,Ua: NEGATIVE
Nitrite: NEGATIVE
Protein, ur: NEGATIVE mg/dL
Specific Gravity, Urine: 1.004 — ABNORMAL LOW (ref 1.005–1.030)
pH: 8 (ref 5.0–8.0)

## 2019-03-17 LAB — POCT FASTING CBG KUC MANUAL ENTRY: POCT Glucose (KUC): 102 mg/dL — AB (ref 70–99)

## 2019-03-17 LAB — MAGNESIUM: Magnesium: 1.7 mg/dL (ref 1.7–2.4)

## 2019-03-17 MED ORDER — SODIUM CHLORIDE 0.9 % IV SOLN: INTRAVENOUS | Status: DC

## 2019-03-17 MED ORDER — SODIUM CHLORIDE 0.9% FLUSH
3.0000 mL | Freq: Two times a day (BID) | INTRAVENOUS | Status: DC
  Administered 2019-03-17 – 2019-03-18 (×2): 3 mL via INTRAVENOUS

## 2019-03-17 MED ORDER — ALPRAZOLAM 0.25 MG PO TABS
0.2500 mg | ORAL_TABLET | Freq: Two times a day (BID) | ORAL | Status: DC
  Administered 2019-03-17 – 2019-03-18 (×2): 0.25 mg via ORAL
  Filled 2019-03-17 (×2): qty 1

## 2019-03-17 MED ORDER — ROSUVASTATIN CALCIUM 10 MG PO TABS
5.0000 mg | ORAL_TABLET | Freq: Every day | ORAL | Status: DC
  Administered 2019-03-17: 5 mg via ORAL
  Filled 2019-03-17: qty 1

## 2019-03-17 MED ORDER — AMLODIPINE BESYLATE 5 MG PO TABS
5.0000 mg | ORAL_TABLET | Freq: Every day | ORAL | Status: DC
  Administered 2019-03-18: 5 mg via ORAL
  Filled 2019-03-17: qty 1

## 2019-03-17 MED ORDER — TIMOLOL MALEATE 0.5 % OP SOLN
1.0000 [drp] | Freq: Two times a day (BID) | OPHTHALMIC | Status: DC
  Administered 2019-03-17 – 2019-03-18 (×2): 1 [drp] via OPHTHALMIC
  Filled 2019-03-17: qty 5

## 2019-03-17 MED ORDER — ASPIRIN EC 81 MG PO TBEC
81.0000 mg | DELAYED_RELEASE_TABLET | Freq: Every day | ORAL | Status: DC
  Administered 2019-03-18: 81 mg via ORAL
  Filled 2019-03-17: qty 1

## 2019-03-17 MED ORDER — POTASSIUM CHLORIDE 10 MEQ/100ML IV SOLN
10.0000 meq | INTRAVENOUS | Status: DC
  Administered 2019-03-17: 10 meq via INTRAVENOUS
  Filled 2019-03-17 (×2): qty 100

## 2019-03-17 MED ORDER — ISOSORBIDE MONONITRATE ER 30 MG PO TB24
15.0000 mg | ORAL_TABLET | Freq: Every day | ORAL | Status: DC
  Administered 2019-03-18: 15 mg via ORAL
  Filled 2019-03-17: qty 1

## 2019-03-17 MED ORDER — ENOXAPARIN SODIUM 40 MG/0.4ML ~~LOC~~ SOLN
40.0000 mg | SUBCUTANEOUS | Status: DC
  Administered 2019-03-18: 40 mg via SUBCUTANEOUS
  Filled 2019-03-17: qty 0.4

## 2019-03-17 NOTE — ED Notes (Addendum)
Pt began having nausea, vomiting, diaphoresis. EMS called per Dr. Alphonzo Cruise. Another EKG performed per Dr. Alphonzo Cruise. Pt is discharged by EMS via stretcher.

## 2019-03-17 NOTE — ED Triage Notes (Signed)
Pt presents to UC w/ c/o dry mouth, weakness, headache, sore throat since this morning. Pt has eaten today.

## 2019-03-17 NOTE — ED Triage Notes (Addendum)
Pt woke up feeling normal  Then took meds at 0800 and started feeling bad. Complaining of dry mouth, weakness, HA, and sore throat since 0900. Sent by [facility] urgent care concerned about stroke and possible brain infection due to HA per EMS. 4 mg of zofran adm

## 2019-03-17 NOTE — ED Provider Notes (Signed)
HPI  SUBJECTIVE:  Sheryl Carter is a 78 y.o. female who presents with intermittent posterior headache of unknown duration starting this morning.  She reports a severely dry mouth and generalized weakness.  She reports nausea.  Daughter states that patient was complaining that her throat was swelling shut this morning although she was able to eat.  No fevers, vomiting, body aches.  Some nasal congestion.  No sore throat, cough, shortness of breath, loss of sense of smell or taste, diarrhea, abdominal pain.  No change in medications.  No chest pain pressure heaviness.  No palpitations, presyncope, syncope.  No photophobia, slurred speech, or leg weakness, facial droop, blurry double vision, she she does not take any over-the-counter medications on a regular basis.  No neck stiffness, rash.  No sinus pain or pressure.  She has had headaches like this before which was thought to be sinusitis.  She denies tongue pain, oral burning or ulcers.  She reports a painless swelling that gets bigger and smaller underneath her left jaw.  This is been present for 2 to 3 days she has an extensive medical history including DVT, stroke, MI status post quadruple bypass, coronary disease, hypertension, hypercholesterolemia, glaucoma.  No history of diabetes.   Past Medical History:  Diagnosis Date  . Anxiety   . Blindness of left eye   . CAD (coronary artery disease)    a. s/p CABG in 05/2016 with LIMA-LAD, SVG-D2, SVG-OM, SVG-RCA  . Complication of anesthesia    has gas trapped in her body after  . DVT (deep venous thrombosis) (HCC)   . Ear problems   . Family history of adverse reaction to anesthesia    daughter has gas trapped in her body postop as well  . Glaucoma   . Hypercholesterolemia   . Hypertension   . Pre-diabetes    pt. told by [facility] staff that she is prediabetic, HgbA1c was 5.4  . Stroke (HCC)    having TIA's    Past Surgical History:  Procedure Laterality Date  . BREAST SURGERY Left    cyst- L breast  . CHOLECYSTECTOMY    . CORONARY ARTERY BYPASS GRAFT N/A 06/09/2016   Procedure: CORONARY ARTERY BYPASS GRAFTING (CABG) times 4 using the left internal mammary artery and bilateral thigh greater saphenous veins harvested endoscopically. LIMA to LAD, SVG to DIAGONAL 2, SVG to OM, SVG to RCA;  Surgeon: Delight Ovens, MD;  Location: [facility] OR;  Service: Open Heart Surgery;  Laterality: N/A;  . ENDARTERECTOMY Left 05/29/2016   Procedure: LEFT CAROTID ENDARTERECTOMY WITH PATCH ANGIOPLASTY;  Surgeon: Larina Earthly, MD;  Location: [facility] OR;  Service: Vascular;  Laterality: Left;  . EYE SURGERY Right    cataract  . LEFT HEART CATH AND CORONARY ANGIOGRAPHY N/A 06/07/2016   Procedure: Left Heart Cath and Coronary Angiography;  Surgeon: Iran Ouch, MD;  Location: [facility] INVASIVE CV LAB;  Service: Cardiovascular;  Laterality: N/A;  . TEE WITHOUT CARDIOVERSION N/A 06/09/2016   Procedure: TRANSESOPHAGEAL ECHOCARDIOGRAM (TEE);  Surgeon: Delight Ovens, MD;  Location: [facility] OR;  Service: Open Heart Surgery;  Laterality: N/A;    Family History  Problem Relation Age of Onset  . Heart disease Sister   . Heart disease Son     Social History   Tobacco Use  . Smoking status: Never Smoker  . Smokeless tobacco: Never Used  Substance Use Topics  . Alcohol use: No  . Drug use: No    No current facility-administered medications for  this encounter.  Current Outpatient Medications:  .  ALPRAZolam (XANAX) 0.25 MG tablet, Take 0.25 mg by mouth 2 (two) times daily. *May take one additional dose at lunch as needed, Disp: , Rfl:  .  amLODipine (NORVASC) 5 MG tablet, Take 1 tablet (5 mg total) by mouth daily., Disp: 90 tablet, Rfl: 3 .  aspirin EC 81 MG tablet, Take 81 mg by mouth daily., Disp: , Rfl:  .  fexofenadine (ALLEGRA) 180 MG tablet, Take 180 mg by mouth daily., Disp: , Rfl:  .  hydrochlorothiazide (MICROZIDE) 12.5 MG capsule, Take 1 capsule (12.5 mg total) by mouth daily., Disp: 90 capsule, Rfl:  3 .  isosorbide mononitrate (IMDUR) 30 MG 24 hr tablet, Take 0.5 tablets (15 mg total) by mouth daily., Disp: 45 tablet, Rfl: 3 .  nitroGLYCERIN (NITROSTAT) 0.4 MG SL tablet, Place 1 tablet (0.4 mg total) under the tongue every 5 (five) minutes as needed for chest pain., Disp: 25 tablet, Rfl: 3 .  Omega-3 Fatty Acids (FISH OIL) 1000 MG CAPS, Take 1,000 mg by mouth daily., Disp: , Rfl:  .  rosuvastatin (CRESTOR) 5 MG tablet, TAKE 1 TABLET BY MOUTH ONCE A DAY., Disp: 90 tablet, Rfl: 1 .  timolol (BETIMOL) 0.5 % ophthalmic solution, Place 1 drop into the right eye 2 (two) times daily. , Disp: , Rfl:  .  vitamin B-12 (CYANOCOBALAMIN) 1000 MCG tablet, Take 1,000 mcg by mouth at bedtime. , Disp: , Rfl:   Allergies  Allergen Reactions  . Other Hives    Mycins-   Pt has Glaucoma in Right eye, Blind in Left eye .... PLEASE DO NOT GIVE ANYTHING TO PATIENT THAT MIGHT DESTROY VISION   . Prednisone Other (See Comments)    Altered mental status  . Statins Other (See Comments)    MYALGIAS Weakness, muscle aches, and pain  . Erythromycin     UNSPECIFIED REACTION   . Tylenol [Acetaminophen] Other (See Comments)    Hallucinations     ROS  As noted in HPI.   Physical Exam  BP (!) 175/91 (BP Location: Right Arm)   Pulse 61   Temp (!) 97.3 F (36.3 C) (Oral)   Resp 16   SpO2 98%   Constitutional: Well developed, well nourished, no acute distress Eyes:  EOMI, conjunctiva normal bilaterally positive cataract left side.  No photophobia. HENT: Normocephalic, atraumatic,mucus membranes moist.  Fullness left submandibular gland.  No tenderness.  No expressible purulent drainage from [facility] or Stensen's duct.  No thrush, intraoral ulcers. Respiratory: Normal inspiratory effort, lungs clear bilaterally, good air movement Cardiovascular: Normal rate regular rhythm no murmurs rubs or gallops GI: nondistended skin: No rash, skin intact Musculoskeletal: no deformities Neurologic: Alert & oriented x 3,  no focal neuro deficits cranial nerves III through XII intact, speech fluent, coordination grossly normal. Psychiatric: Speech and behavior appropriate   ED Course   Medications - No data to display  Orders Placed This Encounter  Procedures  . POCT CBG (manual entry)    Standing Status:   Standing    Number of Occurrences:   1  . ED EKG    Standing Status:   Standing    Number of Occurrences:   1    Order Specific Question:   Reason for Exam    Answer:   Weakness  . ED EKG    Standing Status:   Standing    Number of Occurrences:   1    Order Specific Question:   Reason  for Exam    Answer:   Weakness    Results for orders placed or performed during the hospital encounter of 03/17/19 (from the past 24 hour(s))  POCT CBG (manual entry)     Status: Abnormal   Collection Time: 03/17/19  1:24 PM  Result Value Ref Range   POCT Glucose ([facility]) 102 (A) 70 - 99 mg/dL   No results found.  ED Clinical Impression  1. Weakness   2. Nonintractable headache, unspecified chronicity pattern, unspecified headache type   3. Abnormal EKG      ED Assessment/Plan  EKG: Sinus bradycardia, rate 57.  Normal axis, normal intervals.  No hypertrophy.  T wave flattening in 2, T wave inversion in 3, aVF which is new compared to EKG from 09/2017  Fingerstick 102.  However concern for generalized weakness headaches, and abnormal EKG.  Concern for cardiac cause or even possible stroke.  Transferring to the [facility] emergency department via EMS.  For further work-up.   Wonder if patient has sialiolithaisis L submandibular gland causing dry mouth.  Patient also became very nauseous and started vomiting while here.  Denies chest pain or shortness of breath.  Repeat EKG rate 47, normal axis, normal intervals, with nonspecific ST-T wave changes.  No ST elevation.   Discussed medical decision-making: For medical concerns emergency department with patient and daughter.  They agree to go.    No orders of  the defined types were placed in this encounter.   *This clinic note was created using Dragon dictation software. Therefore, there may be occasional mistakes despite careful proofreading.   ?   Melynda Ripple, MD 03/17/19 1441

## 2019-03-17 NOTE — ED Provider Notes (Signed)
[facility] EMERGENCY DEPARTMENT Provider Note   CSN: [id number] Arrival date & time: 03/17/19  1452     History Chief Complaint  Patient presents with  . Headache    Sheryl Carter is a 78 y.o. female.  HPI   78 year old female with multiple complaints.  Sent from urgent care for further evaluation.  She states that she is generally not feeling well.  Feels weak all over.  Denies any focality.  Posterior headache.  She states that she has had headaches like this previously.  Most recently started a few days ago.  She states that she tried taking Allegra earlier today but she felt that it may be related to her sinuses.  She states that this made her mouth dry.  She is blind in her left eye.  No acute visual complaints.  No vomiting.  No fevers.  No sick contacts that she is aware of.  Past Medical History:  Diagnosis Date  . Anxiety   . Blindness of left eye   . CAD (coronary artery disease)    a. s/p CABG in 05/2016 with LIMA-LAD, SVG-D2, SVG-OM, SVG-RCA  . Complication of anesthesia    has gas trapped in her body after  . DVT (deep venous thrombosis) ([place])   . Ear problems   . Family history of adverse reaction to anesthesia    daughter has gas trapped in her body postop as well  . Glaucoma   . Hypercholesterolemia   . Hypertension   . Pre-diabetes    pt. told by [facility] staff that she is prediabetic, HgbA1c was 5.4  . Stroke [facility])    having TIA's    Patient Active Problem List   Diagnosis Date Noted  . Chest pain 09/02/2017  . Constipation 06/26/2016  . 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 Coronary artery disease   . NSTEMI (non-ST elevated myocardial infarction) ([place]) 06/07/2016  . Paresthesia   . TIA (transient ischemic attack) 06/04/2016  . Symptomatic carotid artery stenosis, left 05/29/2016  . Slurred speech 04/18/2016  . Hypertension, essential 04/18/2016  . Hyperlipidemia 04/18/2016  . Chronic anxiety 04/18/2016    Past Surgical History:  Procedure  Laterality Date  . BREAST SURGERY Left    cyst- L breast  . CHOLECYSTECTOMY    . CORONARY ARTERY BYPASS GRAFT N/A 06/09/2016   Procedure: CORONARY ARTERY BYPASS GRAFTING (CABG) times 4 using the left internal mammary artery and bilateral thigh greater saphenous veins harvested endoscopically. LIMA to LAD, SVG to DIAGONAL 2, SVG to OM, SVG to RCA;  Surgeon: Grace Isaac, MD;  Location: [place];  Service: Open Heart Surgery;  Laterality: N/A;  . ENDARTERECTOMY Left 05/29/2016   Procedure: LEFT CAROTID ENDARTERECTOMY WITH PATCH ANGIOPLASTY;  Surgeon: Rosetta Posner, MD;  Location: [place];  Service: Vascular;  Laterality: Left;  . EYE SURGERY Right    cataract  . LEFT HEART CATH AND CORONARY ANGIOGRAPHY N/A 06/07/2016   Procedure: Left Heart Cath and Coronary Angiography;  Surgeon: Wellington Hampshire, MD;  Location: [place] CV LAB;  Service: Cardiovascular;  Laterality: N/A;  . TEE WITHOUT CARDIOVERSION N/A 06/09/2016   Procedure: TRANSESOPHAGEAL ECHOCARDIOGRAM (TEE);  Surgeon: Grace Isaac, MD;  Location: [place];  Service: Open Heart Surgery;  Laterality: N/A;     OB History   No obstetric history on file.     Family History  Problem Relation Age of Onset  . Heart disease Sister   . Heart disease Son  Social History   Tobacco Use  . Smoking status: Never Smoker  . Smokeless tobacco: Never Used  Substance Use Topics  . Alcohol use: No  . Drug use: No    Home Medications Prior to Admission medications   Medication Sig Start Date End Date Taking? Authorizing Provider  ALPRAZolam (XANAX) 0.25 MG tablet Take 0.25 mg by mouth 2 (two) times daily. *May take one additional dose at lunch as needed    [provider]  amLODipine (NORVASC) 5 MG tablet Take 1 tablet (5 mg total) by mouth daily. 05/14/17 07/02/18  Laqueta Linden, MD  aspirin EC 81 MG tablet Take 81 mg by mouth daily.    [provider]  fexofenadine (ALLEGRA) 180 MG tablet Take 180 mg by mouth  daily.    [provider]  hydrochlorothiazide (MICROZIDE) 12.5 MG capsule Take 1 capsule (12.5 mg total) by mouth daily. 09/23/18   Laqueta Linden, MD  isosorbide mononitrate (IMDUR) 30 MG 24 hr tablet Take 0.5 tablets (15 mg total) by mouth daily. 10/23/18 10/23/19  Laqueta Linden, MD  nitroGLYCERIN (NITROSTAT) 0.4 MG SL tablet Place 1 tablet (0.4 mg total) under the tongue every 5 (five) minutes as needed for chest pain. 09/25/17 07/02/18  Strader, Lennart Pall, PA-C  Omega-3 Fatty Acids (FISH OIL) 1000 MG CAPS Take 1,000 mg by mouth daily.    [provider]  rosuvastatin (CRESTOR) 5 MG tablet TAKE 1 TABLET BY MOUTH ONCE A DAY. 03/14/19   Laqueta Linden, MD  timolol (BETIMOL) 0.5 % ophthalmic solution Place 1 drop into the right eye 2 (two) times daily.     [provider]  vitamin B-12 (CYANOCOBALAMIN) 1000 MCG tablet Take 1,000 mcg by mouth at bedtime.     [provider]    Allergies    Other, Prednisone, Statins, Erythromycin, and Tylenol [acetaminophen]  Review of Systems   Review of Systems All systems reviewed and negative, other than as noted in HPI.  Physical Exam Updated Vital Signs BP 134/81 (BP Location: Left Arm)   Pulse (!) 55   Temp 97.7 F (36.5 C) (Oral)   Resp 17   Ht 4\' 7"  (1.397 m)   Wt 54.9 kg   SpO2 100%   BMI 28.12 kg/m   Physical Exam Vitals and nursing note reviewed.  Constitutional:      General: She is not in acute distress.    Appearance: She is well-developed.  HENT:     Head: Normocephalic and atraumatic.     Comments: Edentulous.  No facial tenderness.  Nose and oropharynx clear.  Neck is supple.  There is some firmness just underneath the body/angle of the left mandible.  It is nontender though.  No overlying skin changes. Eyes:     General:        Right eye: No discharge.        Left eye: No discharge.     Conjunctiva/sclera: Conjunctivae normal.     Comments: Left eye opaque.  Right is  grossly normal in appearance.  Pupil is round and reactive.  Cranial nerves otherwise intact.  Strength is 5 out of 5 bilateral upper and lower extremities.  Sensation intact to light touch.  Cardiovascular:     Rate and Rhythm: Normal rate and regular rhythm.     Heart sounds: Normal heart sounds. No murmur. No friction rub. No gallop.   Pulmonary:     Effort: Pulmonary effort is normal. No respiratory distress.  Breath sounds: Normal breath sounds.  Abdominal:     General: There is no distension.     Palpations: Abdomen is soft.     Tenderness: There is no abdominal tenderness.  Musculoskeletal:        General: No tenderness.     Cervical back: Neck supple.  Skin:    General: Skin is warm and dry.  Neurological:     Mental Status: She is alert.  Psychiatric:        Behavior: Behavior normal.        Thought Content: Thought content normal.     ED Results / Procedures / Treatments   Labs (all labs ordered are listed, but only abnormal results are displayed) Labs Reviewed  URINALYSIS, ROUTINE W REFLEX MICROSCOPIC - Abnormal; Notable for the following components:      Result Value   Color, Urine STRAW (*)    Specific Gravity, Urine 1.004 (*)    All other components within normal limits  BASIC METABOLIC PANEL - Abnormal; Notable for the following components:   Sodium 126 (*)    Potassium 3.4 (*)    Chloride 91 (*)    Glucose, Bld 117 (*)    Calcium 8.8 (*)    All other components within normal limits  COMPREHENSIVE METABOLIC PANEL - Abnormal; Notable for the following components:   Sodium 132 (*)    Chloride 97 (*)    Total Protein 5.6 (*)    Albumin 3.2 (*)    All other components within normal limits  OSMOLALITY, URINE - Abnormal; Notable for the following components:   Osmolality, Ur 243 (*)    All other components within normal limits  NOVEL CORONAVIRUS, NAA (HOSP ORDER, SEND-OUT TO REF LAB; TAT 18-24 HRS)  CBC WITH DIFFERENTIAL/PLATELET  MAGNESIUM  CBC  TSH    MAGNESIUM  SODIUM, URINE, RANDOM    EKG EKG Interpretation  Date/Time:  Monday March 17 2019 16:09:20 EST Ventricular Rate:  109 PR Interval:    QRS Duration: 113 QT Interval:  449 QTC Calculation: 485 R Axis:   28 Text Interpretation: Atrial fibrillation Borderline intraventricular conduction delay Poor data quality Confirmed by Raeford Razor [id number]) on 03/17/2019 5:56:15 PM   Radiology No results found.   CT Head Wo Contrast  Result Date: 03/17/2019 CLINICAL DATA:  78 year old female with headache. EXAM: CT HEAD WITHOUT CONTRAST TECHNIQUE: Contiguous axial images were obtained from the base of the skull through the vertex without intravenous contrast. COMPARISON:  Head CT dated 10/14/2017. FINDINGS: Brain: There is mild age-related atrophy and chronic microvascular ischemic changes. There is no acute intracranial hemorrhage. No mass effect or midline shift. No extra-axial fluid collection. Focal area of old infarct and encephalomalacia involving the right cerebellar hemisphere. Vascular: No hyperdense vessel or unexpected calcification. Atherosclerotic calcification of the supraclinoid ICA bilaterally. Skull: Normal. Negative for fracture or focal lesion. Sinuses/Orbits: The visualized paranasal sinuses and mastoid air cells are clear. Cerumen noted in the left external auditory canal. Other: None IMPRESSION: 1. No acute intracranial hemorrhage. 2. Mild age-related atrophy and chronic microvascular ischemic changes. 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 Electronically Signed   By: Elgie Collard M.D.   On: 03/17/2019 17:43    Procedures Procedures (including critical care time)  Medications Ordered in ED Medications - No data to display  ED Course  I have reviewed the triage vital signs and the nursing notes.  Pertinent labs & imaging results that were available during my care of the patient were reviewed by  me and considered in my medical decision making (see chart for  details).    MDM Rules/Calculators/A&P                      78yF with multiple complaints. Sodium 126. Hyponatremia could potentially explain headache, generalized weakness and vague sense of not feeling well. Declining pain meds. CT w/o acute abnormality. She does not seem encephalopathic though and neuro exam is nonfocal. She is on hydrochlorothiazide.    Final Clinical Impression(s) / ED Diagnoses Final diagnoses:  Hyponatremia    Rx / DC Orders ED Discharge Orders    None       Raeford RazorKohut, Taylorann Tkach, MD 03/23/19 1034

## 2019-03-17 NOTE — H&P (Signed)
History and Physical    PLEASE NOTE THAT DRAGON DICTATION SOFTWARE WAS USED IN THE CONSTRUCTION OF THIS NOTE.   Sheryl Carter ZOX:[id number] DOB: [birth year] DOA: 03/17/2019  PCP: Elfredia Nevins, MD Patient coming from: Home  I have personally briefly reviewed patient's old medical records in [facility]  Chief Complaint: Generalized weakness  HPI: Sheryl Carter is a 78 y.o. female with medical history significant for coronary artery disease status post four-vessel CABG in March 2018, hypertension, hyperlipidemia, chronic blindness in left eye, who is admitted to [facility] on 03/17/2019 with acute hyponatremia after presenting from home to [facility] emergency department complaining of generalized weakness.   The patient reports 4 to 5 days of generalized weakness associated with fatigue in the absence of any associated acute focal neurologic deficits.  Specifically she denies any associated acute focal weakness, paresthesias, numbness, vertigo, facial droop, dysarthria, acute change in vision, or dysphagia.  She reports intermittent posterior headache, but states that she has been experiencing this on an intermittent basis for several years now, without any recent change in the frequency, intensity, or overall quality thereof.  At its worst, the patient reports that her intermittent posterior headache does not warrant any analgesic intervention. denies any recent nausea, vomiting, or diarrhea.  The patient reports that she is on HCTZ at home, does not believe that she has undergone any recent dose modifications this medication.  Denies any use of SSRIs or SNRIs.  Denies any recent trauma.   Denies any associated subjective fever, chills, rigors, or generalized myalgias. Denies any recent neck stiffness, rhinitis, rhinorrhea, sore throat, sob, cough, abdominal pain, rash. No recent traveling or known COVID-19 exposures. Denies dysuria, gross hematuria, or change in urinary  urgency/frequency.    ED Course:  Vital signs in the ED were notable for the following: Temperature max 97.7; heart rate 57-70; blood pressure 125/69-140 3/83; respiratory 15-18, and oxygen saturation 97 to 100% on room air.  Labs were notable for the following: BMP notable for sodium 126 relative to most recent prior value of 131 on 10/14/2017, potassium 3.4, chloride 91, bicarbonate 25, creatinine 0.53; CBC notable for white blood cell count of 6200.  Urinalysis showed no evidence of white blood cells, and was negative for nitrates as well as leukocyte esterase.  Specific gravity noted to be 1.004.   Routine admission screening nasopharyngeal COVID-19 PCR was collected, with result currently pending.  In the setting intermittent headaches and acute hyponatremia, noncontrast CT of the head was obtained, and showed no evidence of acute intracranial pathology.  While in the ED, the patient was started on normal saline at 100 cc/h.    Review of Systems: As per HPI otherwise 10 point review of systems negative.   Past Medical History:  Diagnosis Date  . Anxiety   . Blindness of left eye   . CAD (coronary artery disease)    a. s/p CABG in 05/2016 with LIMA-LAD, SVG-D2, SVG-OM, SVG-RCA  . Complication of anesthesia    has gas trapped in her body after  . DVT (deep venous thrombosis) (HCC)   . Ear problems   . Family history of adverse reaction to anesthesia    daughter has gas trapped in her body postop as well  . Glaucoma   . Hypercholesterolemia   . Hypertension   . Pre-diabetes    pt. told by [facility] staff that she is prediabetic, HgbA1c was 5.4  . Stroke [facility])    having TIA's  Past Surgical History:  Procedure Laterality Date  . BREAST SURGERY Left    cyst- L breast  . CHOLECYSTECTOMY    . CORONARY ARTERY BYPASS GRAFT N/A 06/09/2016   Procedure: CORONARY ARTERY BYPASS GRAFTING (CABG) times 4 using the left internal mammary artery and bilateral thigh greater saphenous veins  harvested endoscopically. LIMA to LAD, SVG to DIAGONAL 2, SVG to OM, SVG to RCA;  Surgeon: Delight Ovens, MD;  Location: [facility] OR;  Service: Open Heart Surgery;  Laterality: N/A;  . ENDARTERECTOMY Left 05/29/2016   Procedure: LEFT CAROTID ENDARTERECTOMY WITH PATCH ANGIOPLASTY;  Surgeon: Larina Earthly, MD;  Location: [facility] OR;  Service: Vascular;  Laterality: Left;  . EYE SURGERY Right    cataract  . LEFT HEART CATH AND CORONARY ANGIOGRAPHY N/A 06/07/2016   Procedure: Left Heart Cath and Coronary Angiography;  Surgeon: Iran Ouch, MD;  Location: [facility] INVASIVE CV LAB;  Service: Cardiovascular;  Laterality: N/A;  . TEE WITHOUT CARDIOVERSION N/A 06/09/2016   Procedure: TRANSESOPHAGEAL ECHOCARDIOGRAM (TEE);  Surgeon: Delight Ovens, MD;  Location: [facility] OR;  Service: Open Heart Surgery;  Laterality: N/A;    Social History:  reports that she has never smoked. She has never used smokeless tobacco. She reports that she does not drink alcohol or use drugs.   Allergies  Allergen Reactions  . Other Hives    Mycins-   Pt has Glaucoma in Right eye, Blind in Left eye .... PLEASE DO NOT GIVE ANYTHING TO PATIENT THAT MIGHT DESTROY VISION   . Prednisone Other (See Comments)    Altered mental status  . Statins Other (See Comments)    MYALGIAS Weakness, muscle aches, and pain  . Erythromycin     UNSPECIFIED REACTION   . Tylenol [Acetaminophen] Other (See Comments)    Hallucinations    Family History  Problem Relation Age of Onset  . Heart disease Sister   . Heart disease Son     Prior to Admission medications   Medication Sig Start Date End Date Taking? Authorizing Provider  ALPRAZolam (XANAX) 0.25 MG tablet Take 0.25 mg by mouth 2 (two) times daily. *May take one additional dose at lunch as needed   Yes [provider]  amLODipine (NORVASC) 5 MG tablet Take 1 tablet (5 mg total) by mouth daily. 05/14/17 03/17/19 Yes Laqueta Linden, MD  aspirin EC 81 MG tablet Take 81 mg by mouth  daily.   Yes [provider]  fexofenadine (ALLEGRA) 180 MG tablet Take 180 mg by mouth daily.   Yes [provider]  hydrochlorothiazide (MICROZIDE) 12.5 MG capsule Take 1 capsule (12.5 mg total) by mouth daily. 09/23/18  Yes Laqueta Linden, MD  isosorbide mononitrate (IMDUR) 30 MG 24 hr tablet Take 0.5 tablets (15 mg total) by mouth daily. 10/23/18 10/23/19 Yes Laqueta Linden, MD  nitroGLYCERIN (NITROSTAT) 0.4 MG SL tablet Place 1 tablet (0.4 mg total) under the tongue every 5 (five) minutes as needed for chest pain. 09/25/17 03/17/19 Yes Strader, Lennart Pall, PA-C  Omega-3 Fatty Acids (FISH OIL) 1000 MG CAPS Take 1,000 mg by mouth daily.   Yes [provider]  rosuvastatin (CRESTOR) 5 MG tablet TAKE 1 TABLET BY MOUTH ONCE A DAY. 03/14/19  Yes Laqueta Linden, MD  timolol (BETIMOL) 0.5 % ophthalmic solution Place 1 drop into the right eye 2 (two) times daily.    Yes [provider]  vitamin B-12 (CYANOCOBALAMIN) 1000 MCG tablet Take 1,000 mcg by mouth at bedtime.  Yes [provider]     Objective    Physical Exam: Vitals:   03/17/19 1700 03/17/19 1715 03/17/19 1730 03/17/19 1745  BP: 136/71  137/64   Pulse: 68 70 69 67  Resp: 14 18 14 17   Temp:      TempSrc:      SpO2: 99% 100% 98% 99%  Weight:      Height:        General: appears to be stated age; alert, oriented Skin: warm, dry, no rash Head:  AT/[place] Eyes:  PEARL b/l, EOMI Mouth:  Oral mucosa membranes appear dry, normal dentition Neck: supple; trachea midline Heart:  RRR; did not appreciate any M/R/G Lungs: CTAB, did not appreciate any wheezes, rales, or rhonchi Abdomen: + BS; soft, ND, NT Vascular: 2+ pedal pulses b/l; 2+ radial pulses b/l Extremities: no peripheral edema, no muscle wasting Neuro: strength and sensation intact in upper and lower extremities b/l   Labs on Admission: I have personally reviewed following labs and imaging studies  CBC: Recent  Labs  Lab 03/17/19 1601  WBC 6.2  NEUTROABS 4.0  HGB 13.1  HCT 37.0  MCV 93.2  PLT 338   Basic Metabolic Panel: Recent Labs  Lab 03/17/19 1601  NA 126*  K 3.4*  CL 91*  CO2 25  GLUCOSE 117*  BUN 9  CREATININE 0.53  CALCIUM 8.8*   GFR: Estimated Creatinine Clearance: 38.2 mL/min (by C-G formula based on SCr of 0.53 mg/dL). Liver Function Tests: No results for input(s): AST, ALT, ALKPHOS, BILITOT, PROT, ALBUMIN in the last 168 hours. No results for input(s): LIPASE, AMYLASE in the last 168 hours. No results for input(s): AMMONIA in the last 168 hours. Coagulation Profile: No results for input(s): INR, PROTIME in the last 168 hours. Cardiac Enzymes: No results for input(s): CKTOTAL, CKMB, CKMBINDEX, TROPONINI in the last 168 hours. BNP (last 3 results) No results for input(s): PROBNP in the last 8760 hours. HbA1C: No results for input(s): HGBA1C in the last 72 hours. CBG: No results for input(s): GLUCAP in the last 168 hours. Lipid Profile: No results for input(s): CHOL, HDL, LDLCALC, TRIG, CHOLHDL, LDLDIRECT in the last 72 hours. Thyroid Function Tests: No results for input(s): TSH, T4TOTAL, FREET4, T3FREE, THYROIDAB in the last 72 hours. Anemia Panel: No results for input(s): VITAMINB12, FOLATE, FERRITIN, TIBC, IRON, RETICCTPCT in the last 72 hours. Urine analysis:    Component Value Date/Time   COLORURINE STRAW (A) 09/02/2017 1610   APPEARANCEUR CLEAR 09/02/2017 1610   LABSPEC 1.003 (L) 09/02/2017 1610   PHURINE 7.0 09/02/2017 1610   GLUCOSEU NEGATIVE 09/02/2017 1610   HGBUR NEGATIVE 09/02/2017 1610   BILIRUBINUR NEGATIVE 09/02/2017 1610   KETONESUR NEGATIVE 09/02/2017 1610   PROTEINUR NEGATIVE 09/02/2017 1610   NITRITE NEGATIVE 09/02/2017 1610   LEUKOCYTESUR NEGATIVE 09/02/2017 1610    Radiological Exams on Admission: CT Head Wo Contrast  Result Date: 03/17/2019 CLINICAL DATA:  78 year old female with headache. EXAM: CT HEAD WITHOUT CONTRAST  TECHNIQUE: Contiguous axial images were obtained from the base of the skull through the vertex without intravenous contrast. COMPARISON:  Head CT dated 10/14/2017. FINDINGS: Brain: There is mild age-related atrophy and chronic microvascular ischemic changes. There is no acute intracranial hemorrhage. No mass effect or midline shift. No extra-axial fluid collection. Focal area of old infarct and encephalomalacia involving the right cerebellar hemisphere. Vascular: No hyperdense vessel or unexpected calcification. Atherosclerotic calcification of the supraclinoid ICA bilaterally. Skull: Normal. Negative for fracture or focal lesion. Sinuses/Orbits: The  visualized paranasal sinuses and mastoid air cells are clear. Cerumen noted in the left external auditory canal. Other: None IMPRESSION: 1. No acute intracranial hemorrhage. 2. Mild age-related atrophy and chronic microvascular ischemic changes. 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 Electronically Signed   By: Elgie Collard M.D.   On: 03/17/2019 17:43    Assessment/Plan   Sheryl Carter is a 78 y.o. female with medical history significant for coronary artery disease status post four-vessel CABG in March 2018, hypertension, hyperlipidemia, chronic blindness in left eye, who is admitted to [facility] on 03/17/2019 with acute hyponatremia after presenting from home to [facility] emergency department complaining of generalized weakness.    Principal Problem:   Acute hyponatremia Active Problems:   Hypertension, essential   Hyperlipidemia   Hypokalemia   Generalized weakness   #) Acute hypoosmolar hyponatremia: Presenting labs reflect serum sodium of 126 relative to historical serum sodium range of 131-140 over the last 1.5 years.  Suspect an element of increased renal losses due to outpatient HCTZ in the context of concomitant hypochloremia.  Clinically, does not appear significantly dehydrated.  Consider the possibility of an element of  psychogenic polydipsia given urinalysis demonstrating diminished specific gravity, however, the patient denies any significant recent increase in her consumption of free water.  No clinical evidence of volume overload at this time.  While the patient presents with generalized weakness and fatigue, presentation does not appear to be associate with any acute focal neurologic deficits.  Plan: Check random urine sodium as well as urine osmolality to evaluate for any further contribution from SIADH.  In the meantime, will refrain from administration of additional IV fluids until completing this component of SIADH evaluation.  Monitor strict I's and O's and daily weights.  Hold home HCTZ.  Check TSH.  Repeat BMP in the morning.     #) Hypokalemia: Presenting labs reflect serum potassium of 3.4.  Plan: Potassium chloride 20 mEq IV over 2 hours x 1 now.  Add on serum magnesium level labs already collected in the ED.  Repeat BMP in the morning.     #) Generalized weakness: The patient reports 4 to 5 days of generalized weakness in the absence of any acute focal neurologic deficits.  There appears to be a contribution from acute hyponatremia, as described above.  No evidence of underlying infectious process at this time.   Plan: Work-up and management of presenting acute hyponatremia.  Check TSH.  I have ordered a physical therapy consult to occur in the morning.  Will follow for result of screening COVID-19 PCR.     #) Essential hypertension: Outpatient antihypertensive regimen includes Imdur, HCTZ, and Norvasc.  Plan: We will hold home HCTZ in the context of presenting acute hyponatremia, as above.  Continue home Norvasc and Imdur.  Close monitoring of ensuing blood pressure via routine vital signs.      #) Hyperlipidemia: On Crestor as an outpatient.  Plan: Continue home statin.      #) Generalized anxiety disorder: The patient reports that she is on scheduled Xanax on a twice daily  basis at home.  She specifically denies taking any SSRI or SNRI, which is pertinent given hyponatremic presentation.  Plan: will continue home scheduled Xanax.    DVT prophylaxis: Lovenox 40 mg subcu daily Code Status: DNR/DNR Family Communication: None Disposition Plan:  Per Rounding Team Consults called: None Admission status: Inpatient; med telemetry.    PLEASE NOTE THAT DRAGON DICTATION SOFTWARE WAS USED IN THE CONSTRUCTION OF  THIS NOTE.   Angie FavaJustin B Estelene Carmack DO [facility] Pager [phone] From 3PM- 11PM.   Otherwise, please contact night-coverage  [email or web address] Password [email or web address]  03/17/2019, 6:10 PM

## 2019-03-18 DIAGNOSIS — R531 Weakness: Secondary | ICD-10-CM | POA: Diagnosis not present

## 2019-03-18 DIAGNOSIS — E876 Hypokalemia: Secondary | ICD-10-CM | POA: Diagnosis not present

## 2019-03-18 DIAGNOSIS — E871 Hypo-osmolality and hyponatremia: Secondary | ICD-10-CM | POA: Diagnosis not present

## 2019-03-18 DIAGNOSIS — E785 Hyperlipidemia, unspecified: Secondary | ICD-10-CM | POA: Diagnosis not present

## 2019-03-18 DIAGNOSIS — I10 Essential (primary) hypertension: Secondary | ICD-10-CM | POA: Diagnosis not present

## 2019-03-18 LAB — COMPREHENSIVE METABOLIC PANEL
ALT: 25 U/L (ref 0–44)
AST: 29 U/L (ref 15–41)
Albumin: 3.2 g/dL — ABNORMAL LOW (ref 3.5–5.0)
Alkaline Phosphatase: 63 U/L (ref 38–126)
Anion gap: 8 (ref 5–15)
BUN: 9 mg/dL (ref 8–23)
CO2: 27 mmol/L (ref 22–32)
Calcium: 8.9 mg/dL (ref 8.9–10.3)
Chloride: 97 mmol/L — ABNORMAL LOW (ref 98–111)
Creatinine, Ser: 0.58 mg/dL (ref 0.44–1.00)
GFR calc Af Amer: >60 mL/min (ref >60)
GFR calc non Af Amer: >60 mL/min (ref >60)
Glucose, Bld: 92 mg/dL (ref 70–99)
Potassium: 3.9 mmol/L (ref 3.5–5.1)
Sodium: 132 mmol/L — ABNORMAL LOW (ref 135–145)
Total Bilirubin: 1 mg/dL (ref 0.3–1.2)
Total Protein: 5.6 g/dL — ABNORMAL LOW (ref 6.5–8.1)

## 2019-03-18 LAB — CBC
HCT: 36.6 % (ref 36.0–46.0)
Hemoglobin: 12.9 g/dL (ref 12.0–15.0)
MCH: 32.9 pg (ref 26.0–34.0)
MCHC: 35.2 g/dL (ref 30.0–36.0)
MCV: 93.4 fL (ref 80.0–100.0)
Platelets: 180 10*3/uL (ref 150–400)
RBC: 3.92 MIL/uL (ref 3.87–5.11)
RDW: 11.7 % (ref 11.5–15.5)
WBC: 5.4 10*3/uL (ref 4.0–10.5)
nRBC: 0 % (ref 0.0–0.2)

## 2019-03-18 LAB — OSMOLALITY, URINE: Osmolality, Ur: 243 mOsm/kg — ABNORMAL LOW (ref 300–900)

## 2019-03-18 LAB — MAGNESIUM: Magnesium: 1.8 mg/dL (ref 1.7–2.4)

## 2019-03-18 LAB — SODIUM, URINE, RANDOM: Sodium, Ur: 59 mmol/L

## 2019-03-18 LAB — NOVEL CORONAVIRUS, NAA (HOSP ORDER, SEND-OUT TO REF LAB; TAT 18-24 HRS): SARS-CoV-2, NAA: NOT DETECTED

## 2019-03-18 LAB — TSH: TSH: 1.784 u[IU]/mL (ref 0.350–4.500)

## 2019-03-18 MED ORDER — ASPIRIN EC 81 MG PO TBEC: 81.0000 mg | DELAYED_RELEASE_TABLET | Freq: Every day | ORAL | 2 refills | Status: AC

## 2019-03-18 MED ORDER — POTASSIUM CHLORIDE CRYS ER 20 MEQ PO TBCR
20.0000 meq | EXTENDED_RELEASE_TABLET | Freq: Once | ORAL | Status: AC
  Administered 2019-03-18: 20 meq via ORAL
  Filled 2019-03-18: qty 1

## 2019-03-18 NOTE — Discharge Instructions (Signed)
1)Stop Hydrochlorothiazide/HCTZ due to low sodium 2)You  need repeat BMP blood test with the primary care physician within a week 3)Please take the rest of the medications as prescribed

## 2019-03-18 NOTE — Evaluation (Signed)
Physical Therapy Evaluation Patient Details Name: Sheryl Carter MRN: [id number] DOB: 07/21/1940 Today's Date: 03/18/2019   History of Present Illness  Sheryl Carter is a 78 y.o. 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    Clinical Impression  Patient able to complete bed mobility and transfers with increased time to complete. She is able to transfer without assistive device but holds on to bed rail upon standing for improving balance. She is able to ambulate without assistive device but reaches for intermittent support from wall/nearby objects and is able to ambulate without reaching for objects as well. She ambulates slowly and is limited by fatigue. Patient is educated on using walker for ambulation while in the hospital and upon returning home for improved balance, safety and to reduce the risk of falls. Patient will benefit from continued physical therapy in hospital and recommended venue below to increase strength, balance, endurance for safe ADLs and gait.     Follow Up Recommendations Home health PT    Equipment Recommendations  None recommended by PT    Recommendations for Other Services       Precautions / Restrictions Precautions Precautions: Fall Restrictions Weight Bearing Restrictions: No      Mobility  Bed Mobility Overal bed mobility: Modified Independent             General bed mobility comments: slightly slow to transition to seated EOB  Transfers Overall transfer level: Modified independent Equipment used: None             General transfer comment: slightly slow, minimal unsteadiness upon standing, patient holds on to bed rail for  support  Ambulation/Gait Ambulation/Gait assistance: Modified independent (Device/Increase time) Gait Distance (Feet): 100 Feet Assistive device: None Gait Pattern/deviations: Step-through pattern;Decreased step length - right;Decreased step length - left;Decreased stride length Gait velocity: decreased   General Gait Details: small steps without AD, decreased arm swing, limited by fatigue, holds on to wall for intermittent support  Stairs            Wheelchair Mobility    Modified Rankin (Stroke Patients Only)       Balance Overall balance assessment: Needs assistance Sitting-balance support: No upper extremity supported;Feet supported Sitting balance-Leahy Scale: Good Sitting balance - Comments: seated EOB     Standing balance-Leahy Scale: Fair Standing balance comment: without AD                             Pertinent Vitals/Pain Pain Assessment: 0-10 Pain Score: 4  Pain Location: headache Pain Intervention(s): Limited activity within patient's tolerance;Monitored during session    [place] expects to be discharged to:: Private residence Living Arrangements: Alone Available Help at Discharge: Family;Available PRN/intermittently(son and daughter, frequent check ins) Type of Home: Mobile home Home Access: Ramped entrance     Home Layout: One level Home Equipment: Walker - standard;Cane - single point;Grab bars - tub/shower;Shower seat      Prior Function Level of Independence: Needs assistance   Gait / Transfers Assistance Needed: modified independent with cane/walker, limited community ambulator  ADL's / Homemaking Assistance Needed: Son and daughter help with transportation, shopping, patient completes ADL in home independently        Hand  Dominance        Extremity/Trunk Assessment   Upper Extremity Assessment Upper Extremity Assessment: Overall WFL for tasks assessed    Lower Extremity Assessment Lower  Extremity Assessment: Generalized weakness       Communication   Communication: No difficulties  Cognition Arousal/Alertness: Awake/alert Behavior During Therapy: WFL for tasks assessed/performed Overall Cognitive Status: Within Functional Limits for tasks assessed                                        General Comments      Exercises     Assessment/Plan    PT Assessment Patient needs continued PT services  PT Problem List Decreased strength;Decreased activity tolerance;Decreased balance;Decreased mobility;Decreased coordination;Pain       PT Treatment Interventions DME instruction;Gait training;Functional mobility training;Stair training;Therapeutic activities;Therapeutic exercise;Balance training;Neuromuscular re-education;Patient/family education;Manual techniques;Modalities    PT Goals (Current goals can be found in the Care Plan section)  Acute Rehab PT Goals Patient Stated Goal: Return to home PT Goal Formulation: With patient Time For Goal Achievement: 03/27/19 Potential to Achieve Goals: Good    Frequency Min 3X/week   Barriers to discharge        Co-evaluation               AM-PAC PT "6 Clicks" Mobility  Outcome Measure Help needed turning from your back to your side while in a flat bed without using bedrails?: None Help needed moving from lying on your back to sitting on the side of a flat bed without using bedrails?: None Help needed moving to and from a bed to a chair (including a wheelchair)?: None Help needed standing up from a chair using your arms (e.g., wheelchair or bedside chair)?: None Help needed to walk in hospital room?: A Little Help needed climbing 3-5 steps with a railing? : A Lot 6 Click Score: 21    End of Session Equipment Utilized During Treatment: Gait belt Activity Tolerance: Patient tolerated treatment well;Patient limited by fatigue Patient left: in chair;with call bell/phone within reach Nurse  Communication: Mobility status PT Visit Diagnosis: Unsteadiness on feet (R26.81);Other abnormalities of gait and mobility (R26.89);Muscle weakness (generalized) (M62.81)    Time: 1610-9604 PT Time Calculation (min) (ACUTE ONLY): 28 min   Charges:   PT Evaluation $PT Eval Low Complexity: 1 Low PT Treatments $Therapeutic Activity: 23-37 mins        9:45 AM, 03/18/19 Wyman Songster PT, DPT Physical Therapist at [facility]

## 2019-03-18 NOTE — Plan of Care (Signed)
  Problem: Acute Rehab PT Goals(only PT should resolve) Goal: Pt Will Transfer Bed To Chair/Chair To Bed Outcome: Progressing Flowsheets (Taken 03/18/2019 0947) Pt will Transfer Bed to Chair/Chair to Bed: (without use of UE support upon standing)  with modified independence  Other (comment) Goal: Pt Will Perform Standing Balance Or Pre-Gait Outcome: Progressing Flowsheets (Taken 03/18/2019 0947) Pt will perform standing balance or pre-gait: with Modified Independent Goal: Pt Will Ambulate Outcome: Progressing Flowsheets (Taken 03/18/2019 0947) Pt will Ambulate:  > 125 feet  with modified independence  9:49 AM, 03/18/19 Mearl Latin PT, DPT Physical Therapist at [facility]

## 2019-03-18 NOTE — TOC Progression Note (Signed)
Transition of Care [facility]) - Progression Note    Patient Details  Name: Sheryl Carter MRN: [id number] Date of Birth: 1941/02/27  [facility] of [facility]) CM/SW Contact  Boneta Lucks, RN Phone Number: 03/18/2019, 10:59 AM  Clinical Narrative:    Patient admitted for acute hyponatremia. PT is recommending Home health PT. Patient and daughter agrees, choices given. Called Tim with [facility], he took the referral for RN/PT.   Expected Discharge Plan: [place] Barriers to Discharge: Barriers Resolved  Expected Discharge Plan and Services Expected Discharge Plan: [place] Choice: [place] arrangements for the past 2 months: Single Family Home                           HH Arranged: PT, RN [place] Agency: [facility] (formerly [employer]) Date [place]: 03/18/19 Time [place]: 1059 Representative spoke with at [place]: Tim   Readmission Risk Interventions No flowsheet data found.

## 2019-03-18 NOTE — Care Management CC44 (Signed)
Condition Code 44 Documentation Completed  Patient Details  Name: ELVERTA DIMICELI MRN: [id number] Date of Birth: October 30, 1940   Condition Code 44 given:  Yes Patient signature on Condition Code 44 notice:  Yes Documentation of 2 MD's agreement:  Yes Code 44 added to claim:  Yes    Boneta Lucks, RN 03/18/2019, 4:11 PM

## 2019-03-18 NOTE — Progress Notes (Signed)
[place] [phone] Quality rating Patient survey rating 2. [place] [phone] Quality rating Patient survey rating 3. [place] [phone] Quality rating Patient survey rating 4. [place] [phone] Quality rating Patient survey rating 5. [place] [phone] Quality rating Patient survey rating 6. [place] [phone] Quality rating Patient survey rating 7. [place] [phone] Quality rating Patient survey rating 8. [place] [phone]

## 2019-03-18 NOTE — Progress Notes (Signed)
Brief note regarding plan, with full H&P to follow:   78 year old female with history of coronary artery disease status post four-vessel CABG in March 2018, hypertension, who is admitted with acute hyponatremia after presenting with complaint of generalized weakness.  Suspect at least partial contribution from home hydrochlorothiazide.  Refraining from additional IV fluids at this time pending additional work-up to evaluate for contribution from SIADH.  Urine studies currently pending.     Babs Bertin, DO Hospitalist

## 2019-03-18 NOTE — Care Management Obs Status (Signed)
[facility] NOTIFICATION   Patient Details  Name: AITANA BURRY MRN: [id number] Date of Birth: 11/30/1940   Medicare Observation Status Notification Given:  Yes    Boneta Lucks, RN 03/18/2019, 4:11 PM

## 2019-03-18 NOTE — Discharge Summary (Signed)
Sheryl Carter, is a 78 y.o. female  DOB 01/09/1941  MRN [id number].  Admission date:  03/17/2019  Admitting Physician  Shon Haleourage Jonette Wassel, MD  Discharge Date:  03/18/2019   Primary MD  Elfredia NevinsFusco, Lawrence, MD  Recommendations for primary care physician for things to follow:   1)Stop Hydrochlorothiazide/HCTZ due to low sodium 2)You  need repeat BMP blood test with the primary care physician within a week 3)Please take the rest of the medications as prescribed  Admission Diagnosis  Acute hyponatremia [E87.1]  Discharge Diagnosis  Acute hyponatremia [E87.1]    Principal Problem:   Acute hyponatremia Active Problems:   Hypertension, essential   Hyperlipidemia   Hypokalemia   Generalized weakness      Past Medical History:  Diagnosis Date  . Anxiety   . Blindness of left eye   . CAD (coronary artery disease)    a. s/p CABG in 05/2016 with LIMA-LAD, SVG-D2, SVG-OM, SVG-RCA  . Complication of anesthesia    has gas trapped in her body after  . DVT (deep venous thrombosis) (HCC)   . Ear problems   . Family history of adverse reaction to anesthesia    daughter has gas trapped in her body postop as well  . Glaucoma   . Hypercholesterolemia   . Hypertension   . Pre-diabetes    [facility] that she is prediabetic, HgbA1c was 5.4  . Stroke (HCC)    having TIA's    Past Surgical History:  Procedure Laterality Date  . BREAST SURGERY Left    cyst- L breast  . CHOLECYSTECTOMY    . CORONARY ARTERY BYPASS GRAFT N/A 06/09/2016   Procedure: CORONARY ARTERY BYPASS GRAFTING (CABG) times 4 using the left internal mammary artery and bilateral thigh greater saphenous veins harvested endoscopically. LIMA to LAD, SVG to DIAGONAL 2, SVG to OM, SVG to RCA;  Surgeon: Delight OvensEdward B Gerhardt, MD;  [facility];  Service: Open Heart Surgery;  Laterality: N/A;  . ENDARTERECTOMY Left 05/29/2016   Procedure: LEFT  CAROTID ENDARTERECTOMY WITH PATCH ANGIOPLASTY;  Surgeon: Larina Earthlyodd F Early, MD;  [facility];  Service: Vascular;  Laterality: Left;  . EYE SURGERY Right    cataract  . LEFT HEART CATH AND CORONARY ANGIOGRAPHY N/A 06/07/2016   Procedure: Left Heart Cath and Coronary Angiography;  Surgeon: Iran OuchMuhammad A Arida, MD;  [facility] INVAS[facility] CV LAB;  Service: Cardiovascular;  Laterality: N/A;  . TEE WITHOUT CARDIOVERSION N/A 06/09/2016   Procedure: TRANSESOPHAGEAL ECHOCARDIOGRAM (TEE);  Surgeon: Delight OvensEdward B Gerhardt, MD;  [facility];  Service: Open Heart Surgery;  Laterality: N/A;       HPI  from the history and physical done on the day of admission:    - Chief Complaint: Generalized weakness  HPI: Sheryl ChanceShirley M Duncombe is a 78 y.o. 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   The patient reports 4 to 5 days of generalized weakness associated with fatigue in the absence of any associated acute focal neurologic deficits.  Specifically she denies any associated acute focal weakness, paresthesias, numbness, vertigo, facial droop, dysarthria, acute change in vision, or dysphagia.  She reports intermittent posterior headache, but states that she has been experiencing this on an intermittent basis for several years now, without any recent change in the frequency, intensity, or overall quality thereof.  At its worst, the patient reports that her intermittent posterior headache does not warrant any analgesic intervention. denies any recent nausea, vomiting, or diarrhea.  The patient reports that she is on HCTZ at home, does not believe that she has undergone any recent dose modifications this medication.  Denies any  use of SSRIs or SNRIs.  Denies any recent trauma.   Denies any associated subjective fever, chills, rigors, or generalized myalgias. Denies any recent neck stiffness, rhinitis, rhinorrhea, sore throat, sob, cough, abdominal pain, rash. No recent traveling or known COVID-19 exposures. Denies dysuria, gross hematuria, or change in urinary urgency/frequency.    ED Course:  Vital signs in the ED were notable for the following: Temperature max 97.7; heart rate 57-70; blood pressure 125/69-140 3/83; respiratory 15-18, and oxygen saturation 97 to 100% on room air.  Labs were notable for the following: BMP notable for sodium 126 relative to most recent prior value of 131 on 10/14/2017, potassium 3.4, chloride 91, bicarbonate 25, creatinine 0.53; CBC notable for white blood cell count of 6200.  Urinalysis showed no evidence of white blood cells, and was negative for nitrates as well as leukocyte esterase.  Specific gravity noted to be 1.004.   Routine admission screening nasopharyngeal COVID-19 PCR was collected, with result currently pending.  In the setting intermittent headaches and acute hyponatremia, noncontrast CT of the head was obtained, and showed no evidence of acute intracranial pathology.  While in the ED, the patient was started on normal saline at 100 cc/h.   Hospital Course:   Brief Summary:- 78 year old female with history of coronary artery disease status post four-vessel CABG in March 2018, hypertension, who is admitted with acute hyponatremia after presenting with complaint of generalized weakness.  Suspect at least partial contribution from home hydrochlorothiazide.   A/p #) Acute hypoosmolar hyponatremia: On admission serum sodium of 126 relative to historical serum sodium range of 131-140 over the last 1.5 years.  Suspect an element of increased renal losses due to outpatient HCTZ in the context of concomitant hypochloremia.   element of psychogenic polydipsia given  urinalysis demonstrating diminished specific gravity - Na is up to 132 -Stop HCTZ, repeat BMP with PCP within a week  #) Hypokalemia:  potassium is up to 3.9 from 3.4.  #) Generalized weakness: PT eval appreciated, recommend home health PT  #) Essential hypertension: Outpatient antihypertensive regimen includes Imdur, HCTZ, and Norvasc. -Continue isosorbide and Norvasc, stop HCTZ due to electrolyte abnormalities  #) Hyperlipidemia: On Crestor as an outpatient.  #) Generalized anxiety disorder: --Continue PTA Xanax  Discharge Condition: stable  Follow UP  Follow-up Information    [facility].   Specialty: Home Health Services Why:   RN and PT  - they will call to set up a visit after Christmas.  Contact information: [place] [id number] [phone]           Diet and Activity recommendation:  As advised  Discharge Instructions    Discharge Instructions    Call MD for:  difficulty breathing, headache or visual disturbances   Complete by: As directed    Call MD for:  persistant dizziness or light-headedness   Complete by: As directed    Call MD for:  persistant nausea and vomiting   Complete by: As directed    Call MD for:  severe uncontrolled pain   Complete by: As directed    Call MD for:  temperature >100.4   Complete by: As directed    Diet general   Complete by: As directed    Discharge instructions   Complete by: As directed    1)Stop Hydrochlorothiazide/HCTZ due to low sodium 2)You  need repeat BMP blood test with the primary care physician within a week 3)Please take the rest of the medications as prescribed   Increase activity slowly   Complete by: As directed        Discharge Medications     Allergies as of 03/18/2019      Reactions   Other Hives   Mycins-  Pt has Glaucoma in Right eye, Blind in Left eye .... PLEASE DO NOT GIVE ANYTHING TO PATIENT THAT MIGHT DESTROY VISION    Prednisone Other (See  Comments)   Altered mental status   Statins Other (See Comments)   MYALGIAS Weakness, muscle aches, and pain   Erythromycin    UNSPECIFIED REACTION    Tylenol [acetaminophen] Other (See Comments)   Hallucinations      Medication List    STOP taking these medications   hydrochlorothiazide 12.5 MG capsule Commonly known as: MICROZIDE     TAKE these medications   ALPRAZolam 0.25 MG tablet Commonly known as: XANAX Take 0.25 mg by mouth 2 (two) times daily. *May take one additional dose at lunch as needed   amLODipine 5 MG tablet Commonly known as: NORVASC Take 1 tablet (5 mg total) by mouth daily.   aspirin EC 81 MG tablet Take 1 tablet (81 mg total) by mouth daily with breakfast. What changed: when to take this   fexofenadine 180 MG tablet Commonly known as: ALLEGRA Take 180 mg by mouth daily.   Fish Oil 1000 MG Caps Take 1,000 mg by mouth daily.   isosorbide mononitrate 30 MG 24 hr tablet Commonly known as: Imdur Take 0.5 tablets (15 mg total) by mouth daily.   nitroGLYCERIN 0.4 MG SL tablet Commonly known as: NITROSTAT Place 1 tablet (0.4 mg total) under the tongue every 5 (five) minutes as needed for chest pain.   rosuvastatin 5 MG tablet Commonly known as: CRESTOR TAKE 1 TABLET BY MOUTH ONCE A DAY.   timolol 0.5 % ophthalmic solution Commonly known as: BETIMOL Place 1 drop into the right eye 2 (two) times daily.   vitamin B-12 1000 MCG tablet Commonly known as: CYANOCOBALAMIN Take 1,000 mcg by mouth at bedtime.      Major procedures and Radiology Reports - PLEASE review detailed and final reports for all details, in brief -   CT Head Wo Contrast  Result Date: 03/17/2019 CLINICAL DATA:  78 year old female with headache. EXAM: CT HEAD WITHOUT CONTRAST TECHNIQUE: Contiguous axial images were obtained from the base of the skull through the vertex without intravenous contrast. COMPARISON:  Head CT dated 10/14/2017. FINDINGS: Brain: There is mild  age-related atrophy and chronic microvascular ischemic changes. There is no acute intracranial hemorrhage. No mass effect or midline shift. No extra-axial fluid collection. Focal area of  old infarct and encephalomalacia involving the right cerebellar hemisphere. Vascular: No hyperdense vessel or unexpected calcification. Atherosclerotic calcification of the supraclinoid ICA bilaterally. Skull: Normal. Negative for fracture or focal lesion. Sinuses/Orbits: The visualized paranasal sinuses and mastoid air cells are clear. Cerumen noted in the left external auditory canal. Other: None IMPRESSION: 1. No acute intracranial hemorrhage. 2. Mild age-related atrophy and chronic microvascular ischemic changes. 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 Electronically Signed   By: Elgie Collard M.D.   On: 03/17/2019 17:43   Micro Results   No results found for this or any previous visit (from the past 240 hour(s)).  Today   Subjective    Lasonya Hubner today has no new concerns -Ambulatory physical therapist, -no DOE, No Dizziness        Patient has been seen and examined prior to discharge   Objective   Blood pressure 115/84, pulse (!) 52, temperature 98.1 F (36.7 C), resp. rate 17, height 4\' 7"  (1.397 m), weight 58.4 kg, SpO2 99 %.   Intake/Output Summary (Last 24 hours) at 03/18/2019 1405 Last data filed at 03/18/2019 0900 Gross per 24 hour  Intake 483.2 ml  Output 2750 ml  Net -2266.8 ml   Exam Gen:- Awake Alert, no acute distress  HEENT:- [place].AT, No sclera icterus Neck-Supple Neck,No JVD,.  Lungs-  CTAB , good air movement bilaterally  CV- S1, S2 normal, regular Abd-  +ve B.Sounds, Abd Soft, No tenderness,    Extremity/Skin:- No  edema,   good pulses Psych-affect is appropriate, oriented x3 Neuro-no new focal deficits, no tremors    Data Review   CBC w Diff:  Lab Results  Component Value Date   WBC 5.4 03/18/2019   HGB 12.9 03/18/2019   HCT 36.6 03/18/2019   PLT 180  03/18/2019   LYMPHOPCT 29 03/17/2019   MONOPCT 5 03/17/2019   EOSPCT 1 03/17/2019   BASOPCT 1 03/17/2019    CMP:  Lab Results  Component Value Date   NA 132 (L) 03/18/2019   K 3.9 03/18/2019   CL 97 (L) 03/18/2019   CO2 27 03/18/2019   BUN 9 03/18/2019   CREATININE 0.58 03/18/2019   CREATININE 0.88 04/30/2017   PROT 5.6 (L) 03/18/2019   ALBUMIN 3.2 (L) 03/18/2019   BILITOT 1.0 03/18/2019   ALKPHOS 63 03/18/2019   AST 29 03/18/2019   ALT 25 03/18/2019  .   Total Discharge time is about 33 minutes  03/20/2019 M.D on 03/18/2019 at 2:05 PM  Go to [email or web address] -  for contact info  [facility] - Office  [phone]

## 2019-03-25 DIAGNOSIS — I2510 Atherosclerotic heart disease of native coronary artery without angina pectoris: Secondary | ICD-10-CM | POA: Diagnosis not present

## 2019-03-25 DIAGNOSIS — Z6828 Body mass index (BMI) 28.0-28.9, adult: Secondary | ICD-10-CM | POA: Diagnosis not present

## 2019-03-25 DIAGNOSIS — E876 Hypokalemia: Secondary | ICD-10-CM | POA: Diagnosis not present

## 2019-03-25 DIAGNOSIS — E871 Hypo-osmolality and hyponatremia: Secondary | ICD-10-CM | POA: Diagnosis not present

## 2019-03-25 DIAGNOSIS — E663 Overweight: Secondary | ICD-10-CM | POA: Diagnosis not present

## 2019-03-25 DIAGNOSIS — I10 Essential (primary) hypertension: Secondary | ICD-10-CM | POA: Diagnosis not present

## 2019-03-26 DIAGNOSIS — F411 Generalized anxiety disorder: Secondary | ICD-10-CM | POA: Diagnosis not present

## 2019-03-26 DIAGNOSIS — Z951 Presence of aortocoronary bypass graft: Secondary | ICD-10-CM | POA: Diagnosis not present

## 2019-03-26 DIAGNOSIS — H5462 Unqualified visual loss, left eye, normal vision right eye: Secondary | ICD-10-CM | POA: Diagnosis not present

## 2019-03-26 DIAGNOSIS — I2510 Atherosclerotic heart disease of native coronary artery without angina pectoris: Secondary | ICD-10-CM | POA: Diagnosis not present

## 2019-03-26 DIAGNOSIS — I10 Essential (primary) hypertension: Secondary | ICD-10-CM | POA: Diagnosis not present

## 2019-03-26 DIAGNOSIS — E785 Hyperlipidemia, unspecified: Secondary | ICD-10-CM | POA: Diagnosis not present

## 2019-03-26 DIAGNOSIS — H409 Unspecified glaucoma: Secondary | ICD-10-CM | POA: Diagnosis not present

## 2019-03-26 DIAGNOSIS — I6522 Occlusion and stenosis of left carotid artery: Secondary | ICD-10-CM | POA: Diagnosis not present

## 2019-03-26 DIAGNOSIS — I252 Old myocardial infarction: Secondary | ICD-10-CM | POA: Diagnosis not present

## 2019-03-27 ENCOUNTER — Other Ambulatory Visit: Payer: Self-pay | Admitting: Student

## 2019-03-31 ENCOUNTER — Telehealth: Payer: Self-pay | Admitting: Cardiovascular Disease

## 2019-03-31 MED ORDER — NITROGLYCERIN 0.4 MG SL SUBL: 0.4000 mg | SUBLINGUAL_TABLET | SUBLINGUAL | 3 refills | Status: DC | PRN

## 2019-03-31 NOTE — Telephone Encounter (Signed)
Refill sent.

## 2019-03-31 NOTE — Telephone Encounter (Signed)
nitroGLYCERIN (NITROSTAT) 0.4 MG SL tablet [[id number]] ENDED  Needing refill

## 2019-05-19 DIAGNOSIS — H40029 Open angle with borderline findings, high risk, unspecified eye: Secondary | ICD-10-CM | POA: Diagnosis not present

## 2019-06-16 ENCOUNTER — Other Ambulatory Visit: Payer: Self-pay | Admitting: Cardiovascular Disease

## 2019-07-09 ENCOUNTER — Ambulatory Visit: Payer: Medicare HMO | Admitting: Cardiovascular Disease

## 2019-07-10 ENCOUNTER — Encounter: Payer: Self-pay | Admitting: Cardiovascular Disease

## 2019-07-10 ENCOUNTER — Ambulatory Visit: Payer: Medicare HMO | Admitting: Cardiovascular Disease

## 2019-07-10 ENCOUNTER — Other Ambulatory Visit: Payer: Self-pay

## 2019-07-10 VITALS — BP 136/76 | HR 66 | Temp 98.0°F | Ht <= 58 in | Wt 136.0 lb

## 2019-07-10 DIAGNOSIS — R600 Localized edema: Secondary | ICD-10-CM

## 2019-07-10 DIAGNOSIS — I25708 Atherosclerosis of coronary artery bypass graft(s), unspecified, with other forms of angina pectoris: Secondary | ICD-10-CM

## 2019-07-10 DIAGNOSIS — Z951 Presence of aortocoronary bypass graft: Secondary | ICD-10-CM

## 2019-07-10 DIAGNOSIS — E785 Hyperlipidemia, unspecified: Secondary | ICD-10-CM

## 2019-07-10 DIAGNOSIS — R142 Eructation: Secondary | ICD-10-CM

## 2019-07-10 DIAGNOSIS — R001 Bradycardia, unspecified: Secondary | ICD-10-CM

## 2019-07-10 DIAGNOSIS — Z9889 Other specified postprocedural states: Secondary | ICD-10-CM

## 2019-07-10 DIAGNOSIS — I10 Essential (primary) hypertension: Secondary | ICD-10-CM

## 2019-07-10 NOTE — Progress Notes (Signed)
SUBJECTIVE: The patient presents for annual follow-up. Past medical history includes coronary artery disease with a history of four-vessel CABG in March 2018 with LIMA-LAD, SVG-D2, SVG-OM, SVG-RCA.She also has hypertension and hyperlipidemia.She has a history of left carotid endarterectomy as well.  She continues to experience periodic sensations of chest fullness alleviated with belching.  She has had a lot more gas recently.  She has been eating more and has put on more weight as she has been at home by herself.  She has taken hydrochlorothiazide tablet on 2 occasions because of feet swelling.  She was recently hospitalized for hyponatremia and knows not to take it routinely.  She has been on Prilosec in the past.    Review of Systems: As per "subjective", otherwise negative.  Allergies  Allergen Reactions  . Other Hives    Mycins-   Pt has Glaucoma in Right eye, Blind in Left eye .... PLEASE DO NOT GIVE ANYTHING TO PATIENT THAT MIGHT DESTROY VISION   . Prednisone Other (See Comments)    Altered mental status  . Statins Other (See Comments)    MYALGIAS Weakness, muscle aches, and pain  . Erythromycin     UNSPECIFIED REACTION   . Tylenol [Acetaminophen] Other (See Comments)    Hallucinations    Current Outpatient Medications  Medication Sig Dispense Refill  . ALPRAZolam (XANAX) 0.25 MG tablet Take 0.25 mg by mouth 2 (two) times daily. *May take one additional dose at lunch as needed    . amLODipine (NORVASC) 5 MG tablet Take 1 tablet (5 mg total) by mouth daily. 90 tablet 3  . aspirin EC 81 MG tablet Take 1 tablet (81 mg total) by mouth daily with breakfast. 120 tablet 2  . fexofenadine (ALLEGRA) 180 MG tablet Take 180 mg by mouth daily.    . isosorbide mononitrate (IMDUR) 30 MG 24 hr tablet Take 0.5 tablets (15 mg total) by mouth daily. 45 tablet 3  . nitroGLYCERIN (NITROSTAT) 0.4 MG SL tablet PLACE 1 TABLET UNDER TONGUE EVERY 5 MINUTES AS NEEDED FOR CHEST PAIN. 25  tablet 0  . nitroGLYCERIN (NITROSTAT) 0.4 MG SL tablet Place 1 tablet (0.4 mg total) under the tongue every 5 (five) minutes as needed for chest pain. 25 tablet 3  . Omega-3 Fatty Acids (FISH OIL) 1000 MG CAPS Take 1,000 mg by mouth daily.    . rosuvastatin (CRESTOR) 5 MG tablet TAKE 1 TABLET BY MOUTH ONCE A DAY. 90 tablet 1  . timolol (BETIMOL) 0.5 % ophthalmic solution Place 1 drop into the right eye 2 (two) times daily.     . vitamin B-12 (CYANOCOBALAMIN) 1000 MCG tablet Take 1,000 mcg by mouth at bedtime.      No current facility-administered medications for this visit.    Past Medical History:  Diagnosis Date  . Anxiety   . Blindness of left eye   . CAD (coronary artery disease)    a. s/p CABG in 05/2016 with LIMA-LAD, SVG-D2, SVG-OM, SVG-RCA  . Complication of anesthesia    has gas trapped in her body after  . DVT (deep venous thrombosis) ([place])   . Ear problems   . Family history of adverse reaction to anesthesia    daughter has gas trapped in her body postop as well  . Glaucoma   . Hypercholesterolemia   . Hypertension   . Pre-diabetes    pt. told by [facility] staff that she is prediabetic, HgbA1c was 5.4  . Stroke [facility])  having TIA's    Past Surgical History:  Procedure Laterality Date  . BREAST SURGERY Left    cyst- L breast  . CHOLECYSTECTOMY    . CORONARY ARTERY BYPASS GRAFT N/A 06/09/2016   Procedure: CORONARY ARTERY BYPASS GRAFTING (CABG) times 4 using the left internal mammary artery and bilateral thigh greater saphenous veins harvested endoscopically. LIMA to LAD, SVG to DIAGONAL 2, SVG to OM, SVG to RCA;  Surgeon: Delight Ovens, MD;  Location: [facility] OR;  Service: Open Heart Surgery;  Laterality: N/A;  . ENDARTERECTOMY Left 05/29/2016   Procedure: LEFT CAROTID ENDARTERECTOMY WITH PATCH ANGIOPLASTY;  Surgeon: Larina Earthly, MD;  Location: [facility] OR;  Service: Vascular;  Laterality: Left;  . EYE SURGERY Right    cataract  . LEFT HEART CATH AND CORONARY ANGIOGRAPHY N/A  06/07/2016   Procedure: Left Heart Cath and Coronary Angiography;  Surgeon: Iran Ouch, MD;  Location: [facility] INVASIVE CV LAB;  Service: Cardiovascular;  Laterality: N/A;  . TEE WITHOUT CARDIOVERSION N/A 06/09/2016   Procedure: TRANSESOPHAGEAL ECHOCARDIOGRAM (TEE);  Surgeon: Delight Ovens, MD;  Location: [facility] OR;  Service: Open Heart Surgery;  Laterality: N/A;    Social History   Socioeconomic History  . Marital status: Divorced    Spouse name: Not on file  . Number of children: 4  . Years of education: 10  . Highest education level: Not on file  Occupational History    Comment: retired  Tobacco Use  . Smoking status: Never Smoker  . Smokeless tobacco: Never Used  Substance and Sexual Activity  . Alcohol use: No  . Drug use: No  . Sexual activity: Not Currently  Other Topics Concern  . Not on file  Social History Narrative   Lives with daughter   No caffeine since Dec 2017   Social Determinants of Health   Financial Resource Strain:   . Difficulty of Paying Living Expenses:   Food Insecurity:   . Worried About Programme researcher, broadcasting/film/video in the Last Year:   . Barista in the Last Year:   Transportation Needs:   . Freight forwarder (Medical):   Marland Kitchen Lack of Transportation (Non-Medical):   Physical Activity:   . Days of Exercise per Week:   . Minutes of Exercise per Session:   Stress:   . Feeling of Stress :   Social Connections:   . Frequency of Communication with Friends and Family:   . Frequency of Social Gatherings with Friends and Family:   . Attends Religious Services:   . Active Member of Clubs or Organizations:   . Attends Banker Meetings:   Marland Kitchen Marital Status:   Intimate Partner Violence:   . Fear of Current or Ex-Partner:   . Emotionally Abused:   Marland Kitchen Physically Abused:   . Sexually Abused:     Marlyn Corporal, RN was present throughout the entirety of the encounter.  Vitals:   07/10/19 1600  BP: 136/76  Pulse: 66  Temp: 98 F  (36.7 C)  SpO2: 98%  Weight: 136 lb (61.7 kg)  Height: 4' 7.5" (1.41 m)    Wt Readings from Last 3 Encounters:  07/10/19 136 lb (61.7 kg)  03/18/19 128 lb 12 oz (58.4 kg)  07/02/18 122 lb (55.3 kg)     PHYSICAL EXAM General: NAD HEENT: Blind in left eye. Neck: No JVD, no thyromegaly. Lungs: Clear to auscultation bilaterally with normal respiratory effort. CV: Regular rate and rhythm, normal S1/S2, no S3/S4,  no murmur. No pretibial or periankle edema.   Abdomen: Soft, nontender, no distention.  Neurologic: Alert and oriented.  Psych: Normal affect. Skin: Normal. Musculoskeletal: No gross deformities.      Labs: Lab Results  Component Value Date/Time   K 3.9 03/18/2019 04:53 AM   BUN 9 03/18/2019 04:53 AM   CREATININE 0.58 03/18/2019 04:53 AM   CREATININE 0.88 04/30/2017 10:23 AM   ALT 25 03/18/2019 04:53 AM   TSH 1.784 03/18/2019 04:53 AM   HGB 12.9 03/18/2019 04:53 AM     Lipids: Lab Results  Component Value Date/Time   LDLCALC 87 12/29/2016 09:55 AM   CHOL 157 12/29/2016 09:55 AM   TRIG 97 12/29/2016 09:55 AM   HDL 51 12/29/2016 09:55 AM      Prior CV studies:   The following studies were reviewed today:  Echocardiogram in March 2018 demonstrated normal left ventricular systolic function, LVEF 50-55%, with akinesis of the inferior myocardium and hypokinesis of the mid inferoseptal myocardium.  Carotid Dopplers in October 2018 demonstrated a patent left carotid endarterectomy in 1-39% right internal carotid artery stenosis.    ASSESSMENT AND PLAN:  1. Coronary artery disease: Status post CABG in March 2018 withLIMA-LAD, SVG-D2, SVG-OM, SVG-RCA.Symptomatically stable on low-dose Imdur 15 mg daily. Continue aspirin andlow dose statin. Beta blockers led to bradycardia.  2. Lower extremity edema: She was hospitalized for hyponatremia.  She can take 12.5 mg of HCTZ once per month.  3. Bradycardia: Stable since discontinuation of beta-blockers.    4. Hypertension: BP is normal.  No changes.  5. Hyperlipidemia: I will obtain a copy of lipids from PCP. She is on rosuvastatin 5 mg daily as she has been intolerant to higher intensity statin therapy. Goal LDL less than 70.  6. Carotid artery disease: History of left carotid endarterectomy. Remains on aspirin and statin therapy.  7.  Increased belching: I recommended she speak with her PCP regarding this.  She had been on Prilosec in the past and denies symptoms related to GERD per se.   Disposition: Follow up 6 months VV   Prentice Docker, M.D., F.A.C.C.

## 2019-07-10 NOTE — Patient Instructions (Signed)
Medication Instructions:  Your physician recommends that you continue on your current medications as directed. Please refer to the Current Medication list given to you today.  *If you need a refill on your cardiac medications before your next appointment, please call your pharmacy*   Lab Work: None today  If you have labs (blood work) drawn today and your tests are completely normal, you will receive your results only by: . MyChart Message (if you have MyChart) OR . A paper copy in the mail If you have any lab test that is abnormal or we need to change your treatment, we will call you to review the results.   Testing/Procedures: None today    Follow-Up: At [facility], you and your health needs are our priority.  As part of our continuing mission to provide you with exceptional heart care, we have created designated Provider Care Teams.  These Care Teams include your primary Cardiologist (physician) and Advanced Practice Providers (APPs -  Physician Assistants and Nurse Practitioners) who all work together to provide you with the care you need, when you need it.  We recommend signing up for the patient portal called "MyChart".  Sign up information is provided on this After Visit Summary.  MyChart is used to connect with patients for Virtual Visits (Telemedicine).  Patients are able to view lab/test results, encounter notes, upcoming appointments, etc.  Non-urgent messages can be sent to your provider as well.   To learn more about what you can do with MyChart, go to [email or web address].    Your next appointment:   6 month(s)  The format for your next appointment:   Virtual Visit   Provider:   Suresh Koneswaran, MD   Other Instructions None      Thank you for choosing [facility] !         

## 2019-07-11 ENCOUNTER — Other Ambulatory Visit: Payer: Self-pay | Admitting: Cardiovascular Disease

## 2019-10-23 DIAGNOSIS — Z6830 Body mass index (BMI) 30.0-30.9, adult: Secondary | ICD-10-CM | POA: Diagnosis not present

## 2019-10-23 DIAGNOSIS — E7849 Other hyperlipidemia: Secondary | ICD-10-CM | POA: Diagnosis not present

## 2019-10-23 DIAGNOSIS — E6609 Other obesity due to excess calories: Secondary | ICD-10-CM | POA: Diagnosis not present

## 2019-10-23 DIAGNOSIS — I2510 Atherosclerotic heart disease of native coronary artery without angina pectoris: Secondary | ICD-10-CM | POA: Diagnosis not present

## 2019-10-23 DIAGNOSIS — F419 Anxiety disorder, unspecified: Secondary | ICD-10-CM | POA: Diagnosis not present

## 2019-10-23 DIAGNOSIS — Z1389 Encounter for screening for other disorder: Secondary | ICD-10-CM | POA: Diagnosis not present

## 2019-10-23 DIAGNOSIS — I10 Essential (primary) hypertension: Secondary | ICD-10-CM | POA: Diagnosis not present

## 2019-10-23 DIAGNOSIS — Z0001 Encounter for general adult medical examination with abnormal findings: Secondary | ICD-10-CM | POA: Diagnosis not present

## 2019-11-17 DIAGNOSIS — H401111 Primary open-angle glaucoma, right eye, mild stage: Secondary | ICD-10-CM | POA: Diagnosis not present

## 2019-12-12 DIAGNOSIS — E860 Dehydration: Secondary | ICD-10-CM | POA: Diagnosis not present

## 2019-12-12 DIAGNOSIS — R52 Pain, unspecified: Secondary | ICD-10-CM | POA: Diagnosis not present

## 2019-12-26 ENCOUNTER — Telehealth: Payer: Self-pay | Admitting: Student

## 2019-12-26 ENCOUNTER — Telehealth: Payer: Medicare HMO | Admitting: Cardiovascular Disease

## 2019-12-26 NOTE — Telephone Encounter (Signed)
  Patient Consent for Virtual Visit         Sheryl Carter has provided verbal consent on 12/26/2019 for a virtual visit (video or telephone).   CONSENT FOR VIRTUAL VISIT FOR:  Sheryl Carter  By participating in this virtual visit I agree to the following:  I hereby voluntarily request, consent and authorize [facility] and its employed or contracted physicians, physician assistants, nurse practitioners or other licensed health care professionals (the Practitioner), to provide me with telemedicine health care services (the "Services") as deemed necessary by the treating Practitioner. I acknowledge and consent to receive the Services by the Practitioner via telemedicine. I understand that the telemedicine visit will involve communicating with the Practitioner through live audiovisual communication technology and the disclosure of certain medical information by electronic transmission. I acknowledge that I have been given the opportunity to request an in-person assessment or other available alternative prior to the telemedicine visit and am voluntarily participating in the telemedicine visit.  I understand that I have the right to withhold or withdraw my consent to the use of telemedicine in the course of my care at any time, without affecting my right to future care or treatment, and that the Practitioner or I may terminate the telemedicine visit at any time. I understand that I have the right to inspect all information obtained and/or recorded in the course of the telemedicine visit and may receive copies of available information for a reasonable fee.  I understand that some of the potential risks of receiving the Services via telemedicine include:  Marland Kitchen Delay or interruption in medical evaluation due to technological equipment failure or disruption; . Information transmitted may not be sufficient (e.g. poor resolution of images) to allow for appropriate medical decision making by the  Practitioner; and/or  . In rare instances, security protocols could fail, causing a breach of personal health information.  Furthermore, I acknowledge that it is my responsibility to provide information about my medical history, conditions and care that is complete and accurate to the best of my ability. I acknowledge that Practitioner's advice, recommendations, and/or decision may be based on factors not within their control, such as incomplete or inaccurate data provided by me or distortions of diagnostic images or specimens that may result from electronic transmissions. I understand that the practice of medicine is not an exact science and that Practitioner makes no warranties or guarantees regarding treatment outcomes. I acknowledge that a copy of this consent can be made available to me via my patient portal [facility] MyChart), or I can request a printed copy by calling the office of [facility].    I understand that my insurance will be billed for this visit.   I have read or had this consent read to me. . I understand the contents of this consent, which adequately explains the benefits and risks of the Services being provided via telemedicine.  . I have been provided ample opportunity to ask questions regarding this consent and the Services and have had my questions answered to my satisfaction. . I give my informed consent for the services to be provided through the use of telemedicine in my medical care

## 2019-12-31 ENCOUNTER — Encounter: Payer: Self-pay | Admitting: *Deleted

## 2019-12-31 ENCOUNTER — Other Ambulatory Visit: Payer: Self-pay

## 2019-12-31 ENCOUNTER — Telehealth (INDEPENDENT_AMBULATORY_CARE_PROVIDER_SITE_OTHER): Payer: Medicare HMO | Admitting: Student

## 2019-12-31 ENCOUNTER — Encounter: Payer: Self-pay | Admitting: Student

## 2019-12-31 VITALS — BP 118/80 | Ht <= 58 in | Wt 135.0 lb

## 2019-12-31 DIAGNOSIS — I10 Essential (primary) hypertension: Secondary | ICD-10-CM

## 2019-12-31 DIAGNOSIS — E785 Hyperlipidemia, unspecified: Secondary | ICD-10-CM

## 2019-12-31 DIAGNOSIS — I25810 Atherosclerosis of coronary artery bypass graft(s) without angina pectoris: Secondary | ICD-10-CM | POA: Diagnosis not present

## 2019-12-31 DIAGNOSIS — Z9889 Other specified postprocedural states: Secondary | ICD-10-CM | POA: Diagnosis not present

## 2019-12-31 MED ORDER — AMLODIPINE BESYLATE 5 MG PO TABS: 5.0000 mg | ORAL_TABLET | Freq: Every day | ORAL | 3 refills | Status: AC

## 2019-12-31 MED ORDER — ROSUVASTATIN CALCIUM 5 MG PO TABS: 5.0000 mg | ORAL_TABLET | Freq: Every day | ORAL | 3 refills | Status: DC

## 2019-12-31 MED ORDER — ISOSORBIDE MONONITRATE ER 30 MG PO TB24: 15.0000 mg | ORAL_TABLET | Freq: Every day | ORAL | 3 refills | Status: DC

## 2019-12-31 NOTE — Patient Instructions (Signed)
Medication Instructions:  Your physician recommends that you continue on your current medications as directed. Please refer to the Current Medication list given to you today.  *If you need a refill on your cardiac medications before your next appointment, please call your pharmacy*   Lab Work: I will request labs from PCP    If you have labs (blood work) drawn today and your tests are completely normal, you will receive your results only by: Marland Kitchen MyChart Message (if you have MyChart) OR . A paper copy in the mail If you have any lab test that is abnormal or we need to change your treatment, we will call you to review the results.   Testing/Procedures: NONE    Follow-Up: At [facility], you and your health needs are our priority.  As part of our continuing mission to provide you with exceptional heart care, we have created designated Provider Care Teams.  These Care Teams include your primary Cardiologist (physician) and Advanced Practice Providers (APPs -  Physician Assistants and Nurse Practitioners) who all work together to provide you with the care you need, when you need it.  We recommend signing up for the patient portal called "MyChart".  Sign up information is provided on this After Visit Summary.  MyChart is used to connect with patients for Virtual Visits (Telemedicine).  Patients are able to view lab/test results, encounter notes, upcoming appointments, etc.  Non-urgent messages can be sent to your provider as well.   To learn more about what you can do with MyChart, go to [email or web address].    Your next appointment:   1 year(s)  The format for your next appointment:   In Person  Provider:   Dina Rich, MD or Randall An, PA-C   Other Instructions Thank you for choosing [facility]!

## 2019-12-31 NOTE — Progress Notes (Signed)
Virtual Visit via Telephone Note   This visit type was conducted due to national recommendations for restrictions regarding the COVID-19 Pandemic (e.g. social distancing) in an effort to limit this patient's exposure and mitigate transmission in our community.  Due to her co-morbid illnesses, this patient is at least at moderate risk for complications without adequate follow up.  This format is felt to be most appropriate for this patient at this time.  The patient did not have access to video technology/had technical difficulties with video requiring transitioning to audio format only (telephone).  All issues noted in this document were discussed and addressed.  No physical exam could be performed with this format.  Please refer to the patient's chart for her  consent to telehealth for [facility].   Date:  12/31/2019   ID:  Sheryl Carter, DOB [id number], MRN [id number] The patient was identified using 2 identifiers.  Patient Location: Home Provider Location: Office/Clinic  PCP:  Elfredia Nevins, MD  Cardiologist:  Prentice Docker, MD (Inactive) --> Will switch to Dr. Wyline Mood Electrophysiologist:  None   Evaluation Performed:  Follow-Up Visit  Chief Complaint: 6 month visit  History of Present Illness:    DAVANEE Carter is a 79 y.o. female with past medical history of CAD (s/p CABG in 05/2016 withLIMA-LAD, SVG-D2, SVG-OM, SVG-RCA), HTN, HLD, carotid artery stenosis (s/p L CEA in 05/2016) and prior TIA's who presents for a 31-month follow-up telehealth visit.   She was last examined by Dr. Purvis Sheffield in 06/2019 and reported intermittent episodes of chest fullness which would resolve with belching but denied any recent exertional chest pain. She had previously been hospitalized for hyponatremia in 02/2019 and was only taking her diuretic as needed. She was continued on her current medication regimen including ASA 81 mg daily, Imdur 15 mg daily, Amlodipine 5 mg daily and Crestor 5  mg daily. She was not on a beta-blocker given prior issues with bradycardia.  In talking with the patient today, she was initially scheduled for an office visit but did switch to a virtual visit due to transportation issues. She is mostly homebound and her daughter who is a Engineer, site typically takes her to appointments and does her grocery shopping. The patient has been cautious given the pandemic and has been vaccinated.  She reports her breathing has been at baseline and denies any recent orthopnea, PND or lower extremity edema. She says her weight has increased on her home scales but she thinks this is secondary to increased caloric consumption and decreased activity around the house. No recent chest pain or palpitations. She did have an episode of "dry mouth" earlier this month and called EMS as she was concerned about possible recurrent hyponatremia. Says she was checked out by EMS and was told everything was fine. She did have recent labs with her PCP per her report.  The patient does not have symptoms concerning for COVID-19 infection (fever, chills, cough, or new shortness of breath).    Past Medical History:  Diagnosis Date  . Anxiety   . Blindness of left eye   . CAD (coronary artery disease)    a. s/p CABG in 05/2016 with LIMA-LAD, SVG-D2, SVG-OM, SVG-RCA  . Complication of anesthesia    has gas trapped in her body after  . DVT (deep venous thrombosis) (HCC)   . Ear problems   . Family history of adverse reaction to anesthesia    daughter has gas trapped in her body postop as well  .  Glaucoma   . Hypercholesterolemia   . Hypertension   . Pre-diabetes    pt. told by [facility] staff that she is prediabetic, HgbA1c was 5.4  . Stroke (HCC)    having TIA's   Past Surgical History:  Procedure Laterality Date  . BREAST SURGERY Left    cyst- L breast  . CHOLECYSTECTOMY    . CORONARY ARTERY BYPASS GRAFT N/A 06/09/2016   Procedure: CORONARY ARTERY BYPASS GRAFTING (CABG) times 4  using the left internal mammary artery and bilateral thigh greater saphenous veins harvested endoscopically. LIMA to LAD, SVG to DIAGONAL 2, SVG to OM, SVG to RCA;  Surgeon: Delight Ovens, MD;  Location: [facility] OR;  Service: Open Heart Surgery;  Laterality: N/A;  . ENDARTERECTOMY Left 05/29/2016   Procedure: LEFT CAROTID ENDARTERECTOMY WITH PATCH ANGIOPLASTY;  Surgeon: Larina Earthly, MD;  Location: [facility] OR;  Service: Vascular;  Laterality: Left;  . EYE SURGERY Right    cataract  . LEFT HEART CATH AND CORONARY ANGIOGRAPHY N/A 06/07/2016   Procedure: Left Heart Cath and Coronary Angiography;  Surgeon: Iran Ouch, MD;  Location: [facility] INVASIVE CV LAB;  Service: Cardiovascular;  Laterality: N/A;  . TEE WITHOUT CARDIOVERSION N/A 06/09/2016   Procedure: TRANSESOPHAGEAL ECHOCARDIOGRAM (TEE);  Surgeon: Delight Ovens, MD;  Location: [facility] OR;  Service: Open Heart Surgery;  Laterality: N/A;     Current Meds  Medication Sig  . ALPRAZolam (XANAX) 0.25 MG tablet Take 0.25 mg by mouth 2 (two) times daily. *May take one additional dose at lunch as needed  . amLODipine (NORVASC) 5 MG tablet Take 1 tablet (5 mg total) by mouth daily.  Marland Kitchen aspirin EC 81 MG tablet Take 1 tablet (81 mg total) by mouth daily with breakfast.  . fexofenadine (ALLEGRA) 180 MG tablet Take 180 mg by mouth daily.  . isosorbide mononitrate (IMDUR) 30 MG 24 hr tablet Take 0.5 tablets (15 mg total) by mouth daily.  . nitroGLYCERIN (NITROSTAT) 0.4 MG SL tablet PLACE 1 TABLET UNDER TONGUE EVERY 5 MINUTES AS NEEDED FOR CHEST PAIN.  Marland Kitchen Omega-3 Fatty Acids (FISH OIL) 1000 MG CAPS Take 1,000 mg by mouth daily.  . rosuvastatin (CRESTOR) 5 MG tablet Take 1 tablet (5 mg total) by mouth daily.  . timolol (BETIMOL) 0.5 % ophthalmic solution Place 1 drop into the right eye 2 (two) times daily.   . timolol (TIMOPTIC) 0.5 % ophthalmic solution Place 1 drop into the right eye 2 (two) times daily.  . vitamin B-12 (CYANOCOBALAMIN) 1000 MCG tablet Take 1,000 mcg  by mouth at bedtime.   . [DISCONTINUED] amLODipine (NORVASC) 5 MG tablet Take 1 tablet (5 mg total) by mouth daily.  . [DISCONTINUED] isosorbide mononitrate (IMDUR) 30 MG 24 hr tablet TAKE 1/2 TABLET BY MOUTH DAILY.  . [DISCONTINUED] rosuvastatin (CRESTOR) 5 MG tablet TAKE 1 TABLET BY MOUTH ONCE A DAY.     Allergies:   Other, Prednisone, Statins, Erythromycin, and Tylenol [acetaminophen]   Social History   Tobacco Use  . Smoking status: Never Smoker  . Smokeless tobacco: Never Used  Vaping Use  . Vaping Use: Never used  Substance Use Topics  . Alcohol use: No  . Drug use: No     Family Hx: The patient's family history includes Heart disease in her sister and son.  ROS:   Please see the history of present illness.     All other systems reviewed and are negative.   Prior CV studies:   The following studies were  reviewed today:  Cardiac Catheterization: 05/2016  Prox RCA lesion, 80 %stenosed.  Mid RCA lesion, 100 %stenosed.  Ost Cx to Prox Cx lesion, 90 %stenosed.  Ost LAD lesion, 90 %stenosed.  LM lesion, 80 %stenosed.  Prox LAD lesion, 90 %stenosed.  Ost 1st Diag to 1st Diag lesion, 80 %stenosed.  Ost 2nd Mrg to 2nd Mrg lesion, 60 %stenosed.  LV end diastolic pressure is moderately elevated.   1. Severe three-vessel and distal left main disease. 2. Moderately elevated left ventricular end-diastolic pressure. 3. Left ventricular angiography was not performed. EF was 50-55% by echo.  Recommendations: I consulted CVTS for CABG. The patient has good targets. It was determined that she had no stroke during this admission. Avoid cardiac catheterization via the right radial artery in the future due to severe innominate artery tortuosity.  TEE: 05/2016 Study Conclusions   - Left ventricle: Systolic function was normal. The estimated  ejection fraction was in the range of 50% to 55%. Hypokinesis of  the inferior myocardium, more pronounced at the base.  -  Staged echo: Limited post-CPB exam: Improved LV function. The  inferior hypokinesis has recovered function. Overall EF 50-55%.   Impressions:   - LV function improvement from pre-bypass images. No other change  from pre-bypass images.    Labs/Other Tests and Data Reviewed:    EKG:  An ECG dated 03/17/2019 was personally reviewed today and demonstrated: significant artifact, HR 109 and PVC's. Computer generated read of atrial fibrillation but not definitive based of artifact. Was in sinus rhythm that admission by review of telemetry.   Recent Labs: 03/18/2019: ALT 25; BUN 9; Creatinine, Ser 0.58; Hemoglobin 12.9; Magnesium 1.8; Platelets 180; Potassium 3.9; Sodium 132; TSH 1.784   Recent Lipid Panel Lab Results  Component Value Date/Time   CHOL 157 12/29/2016 09:55 AM   TRIG 97 12/29/2016 09:55 AM   HDL 51 12/29/2016 09:55 AM   CHOLHDL 5.7 (H) 09/29/2016 08:59 AM   LDLCALC 87 12/29/2016 09:55 AM    Wt Readings from Last 3 Encounters:  12/31/19 135 lb (61.2 kg)  07/10/19 136 lb (61.7 kg)  03/18/19 128 lb 12 oz (58.4 kg)     Objective:    Vital Signs:  BP 118/80   Ht 4' 7.5" (1.41 m)   Wt 135 lb (61.2 kg)   BMI 30.81 kg/m    General: Pleasant female sounding in NAD Psych: Normal affect. Neuro: Alert and oriented X 3. Lungs:  Resp regular and unlabored while talking on the phone.   ASSESSMENT & PLAN:    1. CAD - She is s/p CABG in 05/2016 withLIMA-LAD, SVG-D2, SVG-OM, SVG-RCA. She denies any recent chest pain or dyspnea on exertion. Continue current medication regimen with ASA 81mg  daily, Imdur 15mg  daily and Crestor 5mg  daily. She has not been on a beta-blocker given prior issues with bradycardia.   2. HTN - BP was well-controlled at 118/80 on most recent check. Continue current medication regimen with Amlodipine 5mg  daily and Imdur 15mg  daily.   3. HLD - Followed by PCP. She has been intolerant to high-intensity statin therapy in the past and has remained on  Crestor 5mg  daily. Will request a copy of most recent labs from her PCP.   4. Carotid Artery Stenosis - She is s/p L CEA in 05/2016. Most recent carotid duplex on file is from 12/2016. If not obtained by Vascular in the interim, would plan for repeat imaging at her next visit. At this time, she has transportation issues  and it is difficult for her to attend appointments/testing. Continue ASA and statin therapy.    COVID-19 Education: The signs and symptoms of COVID-19 were discussed with the patient and how to seek care for testing (follow up with PCP or arrange E-visit).  The importance of social distancing was discussed today.  Time:   Today, I have spent 18 minutes with the patient with telehealth technology discussing the above problems.     Medication Adjustments/Labs and Tests Ordered: Current medicines are reviewed at length with the patient today.  Concerns regarding medicines are outlined above.   Tests Ordered: No orders of the defined types were placed in this encounter.   Medication Changes: Meds ordered this encounter  Medications  . amLODipine (NORVASC) 5 MG tablet    Sig: Take 1 tablet (5 mg total) by mouth daily.    Dispense:  90 tablet    Refill:  3    Order Specific Question:   Supervising Provider    Answer:   Lars Masson [id number]  . isosorbide mononitrate (IMDUR) 30 MG 24 hr tablet    Sig: Take 0.5 tablets (15 mg total) by mouth daily.    Dispense:  45 tablet    Refill:  3    Order Specific Question:   Supervising Provider    Answer:   Lars Masson [id number]  . rosuvastatin (CRESTOR) 5 MG tablet    Sig: Take 1 tablet (5 mg total) by mouth daily.    Dispense:  90 tablet    Refill:  3    Order Specific Question:   Supervising Provider    Answer:   Lars Masson [[id number]]    Follow Up:  In Person in 1 year(s)  Signed, Ellsworth Lennox, PA-C  12/31/2019 2:11 PM    [facility]

## 2020-01-01 ENCOUNTER — Encounter: Payer: Self-pay | Admitting: Student

## 2020-03-09 DIAGNOSIS — J302 Other seasonal allergic rhinitis: Secondary | ICD-10-CM | POA: Diagnosis not present

## 2020-03-09 DIAGNOSIS — J029 Acute pharyngitis, unspecified: Secondary | ICD-10-CM | POA: Diagnosis not present

## 2020-04-16 ENCOUNTER — Telehealth: Payer: Self-pay

## 2020-04-16 NOTE — Telephone Encounter (Addendum)
I connected by phone with Loleta Chance and/or patient's caregiver on 04/16/2020 at 10:22 AM to discuss the potential vaccination through our Homebound vaccination initiative.   Prevaccination Checklist for COVID-19 Vaccines  1.  Are you feeling sick today? no 2.  Have you ever received a dose of a COVID-19 vaccine?  yes      If yes, which one? Pfizer   How many dose of Covid-19 vaccine have your received and dates ? 2   Check all that apply: I live in a long-term care setting. no  I have been diagnosed with a medical condition(s). Please list: _______________________ (pertinent to homebound status)  I am a first responder. no  I work in a long-term care facility, correctional facility, hospital, restaurant, retail setting, school, or other setting with high exposure to the public. no  4. Do you have a health condition or are you undergoing treatment that makes you moderately or severely immunocompromised? (This would include treatment for cancer or HIV, receipt of organ transplant, immunosuppressive therapy or high-dose corticosteroids, CAR-T-cell therapy, hematopoietic cell transplant [HCT], DiGeorge syndrome or Wiskott-Aldrich syndrome)  no  5. Have you received hematopoietic cell transplant (HCT) or CAR-T-cell therapies since receiving COVID-19 vaccine? no  6.  Have you ever had an allergic reaction: (This would include a severe reaction [ e.g., anaphylaxis] that required treatment with epinephrine or EpiPen or that caused you to go to the hospital.  It would also include an allergic reaction that occurred within 4 hours that caused hives, swelling, or respiratory distress, including wheezing.) A.  A previous dose of COVID-19 vaccine. no  B.  A vaccine or injectable therapy that contains multiple components, one of which is a COVID-19 vaccine component, but it is not known which component elicited the immediate reaction. no  C.  Are you allergic to polyethylene glycol? no D. Are you  allergic to Polysorbate, which is found in some vaccines, film coated tablets and intravenous steroids?  Yes, tylenol, causes hallucinations, dizziness and anxiety.  7.  Have you ever had an allergic reaction to another vaccine (other than COVID-19 vaccine) or an injectable medication? (This would include a severe reaction [ e.g., anaphylaxis] that required treatment with epinephrine or EpiPen or that caused you to go to the hospital.  It would also include an allergic reaction that occurred within 4 hours that caused hives, swelling, or respiratory distress, including wheezing.)  no   8.  Have you ever had a severe allergic reaction (e.g., anaphylaxis) to something other than a component of the COVID-19 vaccine, or any vaccine or injectable medication?  This would include food, pet, venom, environmental, or oral medication allergies.  no   Check all that apply to you:  Am a female between ages 42 and 77 years old  no  Women 57 through 81 years of age can receive any FDA-authorized or -approved COVID-19 vaccine. However, they should be informed of the rare but increased risk of thrombosis with thrombocytopenia syndrome (TTS) after receipt of the [employer] Vaccine and the availability of other FDA-authorized and -approved COVID-19 vaccines. People who had TTS after a first dose of Janssen vaccine should not receive a subsequent dose of Janssen product    Am a female between ages 65 and 29 years old  no Males 5 through 81 years of age may receive the correct formulation of Pfizer-BioNTech COVID-19 vaccine. Males 18 and older can receive any FDA-authorized or -approved vaccine. However, people receiving an mRNA COVID-19 vaccine, especially  males 57 through 81 years of age and their parents/legal representative (when relevant), should be informed of the risk of developing myocarditis (an inflammation of the heart muscle) or pericarditis (inflammation of the lining around the heart) after receipt of an  mRNA vaccine. The risk of developing either myocarditis or pericarditis after vaccination is low, and lower than the risk of myocarditis associated with SARS-CoV-2 infection in adolescents and adults. Vaccine recipients should be counseled about the need to seek care if symptoms of myocarditis or pericarditis develop after vaccination     Have a history of myocarditis or pericarditis  no Myocarditis or pericarditis after receipt of the first dose of an mRNA COVID-19 vaccine series but before administration of the second dose  Experts advise that people who develop myocarditis or pericarditis after a dose of an mRNA COVID-19 vaccine not receive a subsequent dose of any COVID-19 vaccine, until additional safety data are available.  Administration of a subsequent dose of COVID-19 vaccine before safety data are available can be considered in certain circumstances after the episode of myocarditis or pericarditis has completely resolved. Until additional data are available, some experts recommend a [employer] COVID-19 vaccine be considered instead of an mRNA COVID-19 vaccine. Decisions about proceeding with a subsequent dose should include a conversation between the patient, their parent/legal representative (when relevant), and their clinical team, which may include a cardiologist.    Have been treated with monoclonal antibodies or convalescent serum to prevent or treat COVID-19  no Vaccination should be offered to people regardless of history of prior symptomatic or asymptomatic SARS-CoV-2 infection. There is no recommended minimal interval between infection and vaccination.  However, vaccination should be deferred if a patient received monoclonal antibodies or convalescent serum as treatment for COVID-19 or for post-exposure prophylaxis. This is a precautionary measure until additional information becomes available, to avoid interference of the antibody treatment with vaccine-induced immune responses.  Defer  COVID-19 vaccination for 30 days when a passive antibody product was used for post-exposure prophylaxis.  Defer COVID-19 vaccination for 90 days when a passive antibody product was used to treat COVID-19.     Diagnosed with Multisystem Inflammatory Syndrome (MIS-C or MIS-A) after a COVID-19 infection  no It is unknown if people with a history of MIS-C or MIS-A are at risk for a dysregulated immune response to COVID-19 vaccination.  People with a history of MIS-C or MIS-A may choose to be vaccinated. Considerations for vaccination may include:   Clinical recovery from MIS-C or MIS-A, including return to normal cardiac function   Personal risk of severe acute COVID-19 (e.g., age, underlying conditions)   High or substantial community transmission of SARS-CoV-2 and personal increased risk of reinfection.   Timing of any immunomodulatory therapies (general best practice guidelines for immunization can be consulted for more information [employer])   It has been 90 days or more since their diagnosis of MIS-C   Onset of MIS-C occurred before any COVID-19 vaccination   A conversation between the patient, their guardian(s), and their clinical team or a specialist may assist with COVID-19 vaccination decisions. Healthcare providers and health departments may also request a consultation from the Clinical Immunization Safety Assessment Project at Ordinary[employer].it vaccinesafety/ensuringsafety/monitoring/cisa/index.html.     Have a bleeding disorder  no Take a blood thinner  no As with all vaccines, any COVID-19 vaccine product may be given to these patients, if a physician familiar with the patient's bleeding risk determines that the vaccine can be administered intramuscularly with reasonable safety.  ACIP  recommends the following technique for intramuscular vaccination in patients with bleeding disorders or taking blood thinners: a fine-gauge needle  (23-gauge or smaller caliber) should be used for the vaccination, followed by firm pressure on the site, without rubbing, for at least 2 minutes.  People who regularly take aspirin or anticoagulants as part of their routine medications do not need to stop these medications prior to receipt of any COVID-19 vaccine.    Have a history of heparin-induced thrombocytopenia (HIT)  no Although the etiology of TTS associated with the [employer]ibblesJanssen COVID-19 vaccine is unclear, it appears to be similar to another rare immune-mediated syndrome, heparin-induced thrombocytopenia (HIT). People with a history of an episode of an immune-mediated syndrome characterized by thrombosis and thrombocytopenia, such as HIT, should be offered a currently FDA-approved or FDA-authorized mRNA COVID-19 vaccine if it has been ?90 days since their TTS resolved. After 90 days, patients may be vaccinated with any currently FDA-approved or FDA-authorized COVID-19 vaccine, including Janssen COVID-19 Vaccine. However, people who developed TTS after their initial Lin[employer] DibblesJanssen vaccine should not receive a Janssen booster dose.  Experts believe the following factors do not make people more susceptible to TTS after receipt of the [employer]ans[employer] COVID-19 Vaccine. People with these conditions can be vaccinated with any FDA-authorized or - approved COVID-19 vaccine, including the [employer]ans[employer] COVID-19 Vaccine:   A prior history of venous thromboembolism   Risk factors for venous thromboembolism (e.g., inherited or acquired thrombophilia including Factor V Leiden; prothrombin gene 20210A mutation; antiphospholipid syndrome; protein C, protein S or antithrombin deficiency   A prior history of other types of thromboses not associated with thrombocytopenia   Pregnancy, post-partum status, or receipt of hormonal contraceptives (e.g., combined oral contraceptives, patch, ring)   Additional recipient education materials can be found at AffordableShare.com.brwww.cdc.gov/coronavirus/2019-ncov/  vaccines/safety/JJUpdate.html.    Am currently pregnant or breastfeeding  no Vaccination is recommended for all people aged 81 years and older, including people that are:   Pregnant   Breastfeeding   Trying to get pregnant now or who might become pregnant in the future   Pregnant, breastfeeding, and post-partum people 5718 through 81 years of age should be aware of the rare risk of TTS after receipt of the Lin[employer] DibblesJanssen COVID-19 Vaccine and the availability of other FDA-authorized or -approved COVID-19 vaccines (i.e., mRNA vaccines).    Have received dermal fillers  no FDA-authorized or -approved COVID-19 vaccines can be administered to people who have received injectable dermal fillers who have no contraindications for vaccination.  Infrequently, these people might experience temporary swelling at or near the site of filler injection (usually the face or lips) following administration of a dose of an mRNA COVID-19 vaccine. These people should be advised to contact their healthcare provider if swelling develops at or near the site of dermal filler following vaccination.     Have a history of Guillain-Barr Syndrome (GBS)  no People with a history of GBS can receive any FDA-authorized or -approved COVID-19 vaccine. However, given the possible association between the [employer]ans[employer] COVID-19 Vaccine and an increased risk of GBS, a patient with a history of GBS and their clinical team should discuss the availability of mRNA vaccines to offer protection against COVID-19. The highest risk has been observed in men aged 50-64 years with symptoms of GBS beginning within 42 days after Linwood DibblesJanssen COVID-19 vaccination.  People who had GBS after receiving Janssen vaccine should be made aware of the option to receive an mRNA COVID-19 vaccine booster at least 2 months (8 weeks) after the  Janssen dose. However, Lin[employer] D[employer]bles vaccine may be used as a booster, particularly if GBS occurred more than 42 days after vaccination or was related  to a non-vaccine factor. Prior to booster vaccination, a conversation between the patient and their clinical team may assist with decisions about use of a COVID-19 booster dose, including the timing of administration     Postvaccination Observation Times for People without Contraindications to Covid 19 Vaccination.  30 minutes:  People with a history of: A contraindication to another type of COVID-19 vaccine product (i.e., mRNA or viral vector COVID-19 vaccines)   Immediate (within 4 hours of exposure) non-severe allergic reaction to a COVID-19 vaccine or injectable therapies   Anaphylaxis due to any cause   Immediate allergic reaction of any severity to a non-COVID-19 vaccine   15 minutes: All other people  This patient is a 80 y.o. female that meets the FDA criteria to receive homebound vaccination. Patient or parent/caregiver understands they have the option to accept or refuse homebound vaccination.  Patient passed the pre-screening checklist and would like to proceed with homebound vaccination.  Based on questionnaire above, I recommend the patient be observed for 15 minutes.  There are an estimated #0 other household members/caregivers who are also interested in receiving the vaccine.    The patient has been confirmed homebound and eligible for homebound vaccination with the considerations outlined above. I will send the patient's information to our scheduling team who will reach out to schedule the patient and potential caregiver/family members for homebound vaccination.    Skip Mayer 04/16/2020 10:22 AM

## 2020-05-18 DIAGNOSIS — H401130 Primary open-angle glaucoma, bilateral, stage unspecified: Secondary | ICD-10-CM | POA: Diagnosis not present

## 2020-06-09 ENCOUNTER — Ambulatory Visit: Payer: Medicare HMO | Attending: Critical Care Medicine

## 2020-06-09 ENCOUNTER — Other Ambulatory Visit: Payer: Self-pay

## 2020-06-09 DIAGNOSIS — Z23 Encounter for immunization: Secondary | ICD-10-CM

## 2020-06-09 NOTE — Progress Notes (Signed)
   Covid-19 Vaccination Clinic  Name:  Sheryl Carter    MRN: [id number] DOB: Feb 24, 1941  06/09/2020  Sheryl Carter was observed post Covid-19 immunization for 15 minutes without incident. She was provided with Vaccine Information Sheet and instruction to access the V-Safe system.   Sheryl Carter was instructed to call 911 with any severe reactions post vaccine: Marland Kitchen Difficulty breathing  . Swelling of face and throat  . A fast heartbeat  . A bad rash all over body  . Dizziness and weakness   Immunizations Administered    Name Date Dose VIS Date Route   PFIZER Comrnaty(Gray TOP) Covid-19 Vaccine 06/09/2020  1:38 PM 0.3 mL 03/04/2020 Intramuscular   Manufacturer: [employer], [employer]   Lot: [id number]   NDC: [phone]

## 2020-07-03 ENCOUNTER — Encounter: Payer: Self-pay | Admitting: Emergency Medicine

## 2020-07-03 ENCOUNTER — Other Ambulatory Visit: Payer: Self-pay

## 2020-07-03 ENCOUNTER — Ambulatory Visit
Admission: EM | Admit: 2020-07-03 | Discharge: 2020-07-03 | Disposition: A | Discharge status: Home / Self Care | Payer: Medicare HMO | Attending: Emergency Medicine | Admitting: Emergency Medicine

## 2020-07-03 DIAGNOSIS — L237 Allergic contact dermatitis due to plants, except food: Secondary | ICD-10-CM | POA: Diagnosis not present

## 2020-07-03 MED ORDER — DEXAMETHASONE SODIUM PHOSPHATE 10 MG/ML IJ SOLN
5.0000 mg | Freq: Once | INTRAMUSCULAR | Status: AC
  Administered 2020-07-03: 5 mg via INTRAMUSCULAR

## 2020-07-03 MED ORDER — DEXAMETHASONE SODIUM PHOSPHATE 10 MG/ML IJ SOLN: 10.0000 mg | Freq: Once | INTRAMUSCULAR | Status: DC

## 2020-07-03 MED ORDER — TRIAMCINOLONE ACETONIDE 0.1 % EX CREA: 1.0000 "application " | TOPICAL_CREAM | Freq: Two times a day (BID) | CUTANEOUS | 0 refills | Status: DC

## 2020-07-03 NOTE — ED Provider Notes (Signed)
[facility]   [id number] 07/03/20 Arrival Time: 1004   Chief Complaint  Patient presents with  . Rash    SUBJECTIVE: History from: patient.  Sheryl Carter is a 80 y.o. female who presented to the urgent care with a complaint of rash that started this past Wednesday.  She denies changes in soaps, detergents, or anyone with similar symptoms.  Focal rash to her arm and neck.  She describes it as red spreading and itchy.  Has tried OTC poison ivy ointment without relief.  Denies alleviating or aggravating factors.denies similar symptoms in the past.  ROS: As per HPI.  All other pertinent ROS negative.     Past Medical History:  Diagnosis Date  . Anxiety   . Blindness of left eye   . CAD (coronary artery disease)    a. s/p CABG in 05/2016 with LIMA-LAD, SVG-D2, SVG-OM, SVG-RCA  . Complication of anesthesia    has gas trapped in her body after  . DVT (deep venous thrombosis) (HCC)   . Ear problems   . Family history of adverse reaction to anesthesia    daughter has gas trapped in her body postop as well  . Glaucoma   . Hypercholesterolemia   . Hypertension   . Pre-diabetes    pt. told by [facility] staff that she is prediabetic, HgbA1c was 5.4  . Stroke (HCC)    having TIA's   Past Surgical History:  Procedure Laterality Date  . BREAST SURGERY Left    cyst- L breast  . CHOLECYSTECTOMY    . CORONARY ARTERY BYPASS GRAFT N/A 06/09/2016   Procedure: CORONARY ARTERY BYPASS GRAFTING (CABG) times 4 using the left internal mammary artery and bilateral thigh greater saphenous veins harvested endoscopically. LIMA to LAD, SVG to DIAGONAL 2, SVG to OM, SVG to RCA;  Surgeon: Delight Ovens, MD;  Location: [facility] OR;  Service: Open Heart Surgery;  Laterality: N/A;  . ENDARTERECTOMY Left 05/29/2016   Procedure: LEFT CAROTID ENDARTERECTOMY WITH PATCH ANGIOPLASTY;  Surgeon: Larina Earthly, MD;  Location: [facility] OR;  Service: Vascular;  Laterality: Left;  . EYE SURGERY Right    cataract  .  LEFT HEART CATH AND CORONARY ANGIOGRAPHY N/A 06/07/2016   Procedure: Left Heart Cath and Coronary Angiography;  Surgeon: Iran Ouch, MD;  Location: [facility] INVASIVE CV LAB;  Service: Cardiovascular;  Laterality: N/A;  . TEE WITHOUT CARDIOVERSION N/A 06/09/2016   Procedure: TRANSESOPHAGEAL ECHOCARDIOGRAM (TEE);  Surgeon: Delight Ovens, MD;  Location: [facility] OR;  Service: Open Heart Surgery;  Laterality: N/A;   Allergies  Allergen Reactions  . Other Hives    Mycins-   Pt has Glaucoma in Right eye, Blind in Left eye .... PLEASE DO NOT GIVE ANYTHING TO PATIENT THAT MIGHT DESTROY VISION   . Prednisone Other (See Comments)    Altered mental status  . Statins Other (See Comments)    MYALGIAS Weakness, muscle aches, and pain  . Erythromycin     UNSPECIFIED REACTION   . Tylenol [Acetaminophen] Other (See Comments)    Hallucinations   No current facility-administered medications on file prior to encounter.   Current Outpatient Medications on File Prior to Encounter  Medication Sig Dispense Refill  . ALPRAZolam (XANAX) 0.25 MG tablet Take 0.25 mg by mouth 2 (two) times daily. *May take one additional dose at lunch as needed    . amLODipine (NORVASC) 5 MG tablet Take 1 tablet (5 mg total) by mouth daily. 90 tablet 3  . aspirin  EC 81 MG tablet Take 1 tablet (81 mg total) by mouth daily with breakfast. 120 tablet 2  . fexofenadine (ALLEGRA) 180 MG tablet Take 180 mg by mouth daily.    . isosorbide mononitrate (IMDUR) 30 MG 24 hr tablet Take 0.5 tablets (15 mg total) by mouth daily. 45 tablet 3  . nitroGLYCERIN (NITROSTAT) 0.4 MG SL tablet PLACE 1 TABLET UNDER TONGUE EVERY 5 MINUTES AS NEEDED FOR CHEST PAIN. 25 tablet 0  . Omega-3 Fatty Acids (FISH OIL) 1000 MG CAPS Take 1,000 mg by mouth daily.    . rosuvastatin (CRESTOR) 5 MG tablet Take 1 tablet (5 mg total) by mouth daily. 90 tablet 3  . timolol (BETIMOL) 0.5 % ophthalmic solution Place 1 drop into the right eye 2 (two) times daily.     .  timolol (TIMOPTIC) 0.5 % ophthalmic solution Place 1 drop into the right eye 2 (two) times daily.    . vitamin B-12 (CYANOCOBALAMIN) 1000 MCG tablet Take 1,000 mcg by mouth at bedtime.      Social History   Socioeconomic History  . Marital status: Divorced    Spouse name: Not on file  . Number of children: 4  . Years of education: 10  . Highest education level: Not on file  Occupational History    Comment: retired  Tobacco Use  . Smoking status: Never Smoker  . Smokeless tobacco: Never Used  Vaping Use  . Vaping Use: Never used  Substance and Sexual Activity  . Alcohol use: No  . Drug use: No  . Sexual activity: Not Currently  Other Topics Concern  . Not on file  Social History Narrative   Lives with daughter   No caffeine since Dec 2017   Social Determinants of Health   Financial Resource Strain: Not on file  Food Insecurity: Not on file  Transportation Needs: Not on file  Physical Activity: Not on file  Stress: Not on file  Social Connections: Not on file  Intimate Partner Violence: Not on file   Family History  Problem Relation Age of Onset  . Heart disease Sister   . Heart disease Son     OBJECTIVE:  Vitals:   07/03/20 1016  BP: (!) 145/88  Pulse: 67  Resp: 17  Temp: 98.3 F (36.8 C)  TempSrc: Oral  SpO2: 95%     Physical Exam Vitals and nursing note reviewed.  Constitutional:      General: She is not in acute distress.    Appearance: Normal appearance. She is normal weight. She is not ill-appearing, toxic-appearing or diaphoretic.  HENT:     Head: Normocephalic.  Cardiovascular:     Rate and Rhythm: Normal rate and regular rhythm.     Pulses: Normal pulses.     Heart sounds: Normal heart sounds. No murmur heard. No friction rub. No gallop.   Pulmonary:     Effort: Pulmonary effort is normal. No respiratory distress.     Breath sounds: Normal breath sounds. No stridor. No wheezing, rhonchi or rales.  Chest:     Chest wall: No tenderness.   Skin:    General: Skin is warm.     Findings: Rash present. Rash is macular.  Neurological:     Mental Status: She is alert and oriented to person, place, and time.     LABS:  No results found for this or any previous visit (from the past 24 hour(s)).   ASSESSMENT & PLAN:  1. Poison ivy dermatitis  Meds ordered this encounter  Medications  . DISCONTD: dexamethasone (DECADRON) injection 10 mg  . dexamethasone (DECADRON) injection 5 mg   Patient is stable at discharge.  She is allergic to steroid therefore on 5 mg IM of Decadron was used.  Will monitor patient for 15-minute before discharge.  Discharge instructions  Continue Allegra-D as prescribed and directed Decadron IM was given in office Limit hot shower and baths, or bathe with warm water.   Moisturize skin daily Follow up with PCP if symptoms persists Return or go to the ER if you have any new or worsening symptoms  Reviewed expectations re: course of current medical issues. Questions answered. Outlined signs and symptoms indicating need for more acute intervention. Patient verbalized understanding. After Visit Summary given.         Durward Parcel, FNP 07/03/20 1035

## 2020-07-03 NOTE — ED Triage Notes (Signed)
Itchy rash on arms and neck that came up on Wednesday.

## 2020-07-03 NOTE — Discharge Instructions (Addendum)
Continue Allegra-D as prescribed and directed Decadron IM was given in office Limit hot shower and baths, or bathe with warm water.   Moisturize skin daily Follow up with PCP if symptoms persists Return or go to the ER if you have any new or worsening

## 2020-09-22 DIAGNOSIS — K5989 Other specified functional intestinal disorders: Secondary | ICD-10-CM | POA: Diagnosis not present

## 2020-09-22 DIAGNOSIS — Z0001 Encounter for general adult medical examination with abnormal findings: Secondary | ICD-10-CM | POA: Diagnosis not present

## 2020-09-22 DIAGNOSIS — B351 Tinea unguium: Secondary | ICD-10-CM | POA: Diagnosis not present

## 2020-09-22 DIAGNOSIS — K529 Noninfective gastroenteritis and colitis, unspecified: Secondary | ICD-10-CM | POA: Diagnosis not present

## 2020-09-22 DIAGNOSIS — Z1389 Encounter for screening for other disorder: Secondary | ICD-10-CM | POA: Diagnosis not present

## 2020-09-22 DIAGNOSIS — I10 Essential (primary) hypertension: Secondary | ICD-10-CM | POA: Diagnosis not present

## 2020-09-22 DIAGNOSIS — E6609 Other obesity due to excess calories: Secondary | ICD-10-CM | POA: Diagnosis not present

## 2020-09-22 DIAGNOSIS — E782 Mixed hyperlipidemia: Secondary | ICD-10-CM | POA: Diagnosis not present

## 2020-09-22 DIAGNOSIS — E119 Type 2 diabetes mellitus without complications: Secondary | ICD-10-CM | POA: Diagnosis not present

## 2020-09-22 DIAGNOSIS — Z6831 Body mass index (BMI) 31.0-31.9, adult: Secondary | ICD-10-CM | POA: Diagnosis not present

## 2020-11-08 ENCOUNTER — Ambulatory Visit
Admission: EM | Admit: 2020-11-08 | Discharge: 2020-11-08 | Disposition: A | Discharge status: Home / Self Care | Payer: Medicare HMO | Attending: Family Medicine | Admitting: Family Medicine

## 2020-11-08 ENCOUNTER — Other Ambulatory Visit: Payer: Self-pay

## 2020-11-08 ENCOUNTER — Encounter: Payer: Self-pay | Admitting: Emergency Medicine

## 2020-11-08 DIAGNOSIS — K649 Unspecified hemorrhoids: Secondary | ICD-10-CM | POA: Diagnosis not present

## 2020-11-08 DIAGNOSIS — M25472 Effusion, left ankle: Secondary | ICD-10-CM

## 2020-11-08 DIAGNOSIS — M25471 Effusion, right ankle: Secondary | ICD-10-CM

## 2020-11-08 DIAGNOSIS — K5900 Constipation, unspecified: Secondary | ICD-10-CM

## 2020-11-08 MED ORDER — HYDROCORTISONE (PERIANAL) 2.5 % EX CREA: 1.0000 "application " | TOPICAL_CREAM | Freq: Two times a day (BID) | CUTANEOUS | 0 refills | Status: DC

## 2020-11-08 MED ORDER — POLYETHYLENE GLYCOL 3350 17 GM/SCOOP PO POWD: 1.0000 | Freq: Once | ORAL | 0 refills | Status: AC

## 2020-11-08 NOTE — ED Provider Notes (Signed)
[facility]    CSN: [id number] Arrival date & time: 11/08/20  1432      History   Chief Complaint Chief Complaint  Patient presents with   Hemorrhoids    HPI Sheryl Carter is a 80 y.o. female.   HPI Patient presents today for evaluation of hemorrhoids x 2 days. Reports chronic constipation and having bowel movements approximately every 3 days.  Previously she was prescribed MiraLAX had not been taking for quite some time as she has not needed any type of stool softener.  Over the last few days the hemorrhoid has been severely inflamed and she has used Tucks pads without any relief.  Denies any bleeding of hemorrhoids or noticing any dark or tarry stools.  Denies any abdominal pain.  Past Medical History:  Diagnosis Date   Anxiety    Blindness of left eye    CAD (coronary artery disease)    a. s/p CABG in 05/2016 with LIMA-LAD, SVG-D2, SVG-OM, SVG-RCA   Complication of anesthesia    has gas trapped in her body after   DVT (deep venous thrombosis) (HCC)    Ear problems    Family history of adverse reaction to anesthesia    daughter has gas trapped in her body postop as well   Glaucoma    Hypercholesterolemia    Hypertension    Pre-diabetes    pt. told by [facility] staff that she is prediabetic, HgbA1c was 5.4   Stroke [facility])    having TIA's    Patient Active Problem List   Diagnosis Date Noted   Hypokalemia 03/18/2019   Generalized weakness 03/18/2019   Acute hyponatremia 03/17/2019   Chest pain 09/02/2017   Constipation 06/26/2016   S/P CABG x 4 06/19/2016   Coronary artery disease    NSTEMI (non-ST elevated myocardial infarction) (HCC) 06/07/2016   Paresthesia    TIA (transient ischemic attack) 06/04/2016   Symptomatic carotid artery stenosis, left 05/29/2016   Slurred speech 04/18/2016   Hypertension, essential 04/18/2016   Hyperlipidemia 04/18/2016   Chronic anxiety 04/18/2016    Past Surgical History:  Procedure Laterality Date   BREAST  SURGERY Left    cyst- L breast   CHOLECYSTECTOMY     CORONARY ARTERY BYPASS GRAFT N/A 06/09/2016   Procedure: CORONARY ARTERY BYPASS GRAFTING (CABG) times 4 using the left internal mammary artery and bilateral thigh greater saphenous veins harvested endoscopically. LIMA to LAD, SVG to DIAGONAL 2, SVG to OM, SVG to RCA;  Surgeon: Delight Ovens, MD;  Location: [facility] OR;  Service: Open Heart Surgery;  Laterality: N/A;   ENDARTERECTOMY Left 05/29/2016   Procedure: LEFT CAROTID ENDARTERECTOMY WITH PATCH ANGIOPLASTY;  Surgeon: Larina Earthly, MD;  Location: [facility] OR;  Service: Vascular;  Laterality: Left;   EYE SURGERY Right    cataract   LEFT HEART CATH AND CORONARY ANGIOGRAPHY N/A 06/07/2016   Procedure: Left Heart Cath and Coronary Angiography;  Surgeon: Iran Ouch, MD;  Location: [facility] INVASIVE CV LAB;  Service: Cardiovascular;  Laterality: N/A;   TEE WITHOUT CARDIOVERSION N/A 06/09/2016   Procedure: TRANSESOPHAGEAL ECHOCARDIOGRAM (TEE);  Surgeon: Delight Ovens, MD;  Location: [facility] OR;  Service: Open Heart Surgery;  Laterality: N/A;    OB History   No obstetric history on file.      Home Medications    Prior to Admission medications   Medication Sig Start Date End Date Taking? Authorizing Provider  hydrocortisone (ANUSOL-HC) 2.5 % rectal cream Place 1 application rectally 2 (  two) times daily. 11/08/20  Yes Bing Neighbors, FNP  ALPRAZolam Prudy Feeler) 0.25 MG tablet Take 0.25 mg by mouth 2 (two) times daily. *May take one additional dose at lunch as needed    [provider]  amLODipine (NORVASC) 5 MG tablet Take 1 tablet (5 mg total) by mouth daily. 12/31/19 03/30/20  Strader, Lennart Pall, PA-C  aspirin EC 81 MG tablet Take 1 tablet (81 mg total) by mouth daily with breakfast. 03/18/19   Shon Hale, MD  fexofenadine (ALLEGRA) 180 MG tablet Take 180 mg by mouth daily.    [provider]  isosorbide mononitrate (IMDUR) 30 MG 24 hr tablet Take 0.5 tablets (15 mg total) by mouth  daily. 12/31/19   Strader, Lennart Pall, PA-C  nitroGLYCERIN (NITROSTAT) 0.4 MG SL tablet PLACE 1 TABLET UNDER TONGUE EVERY 5 MINUTES AS NEEDED FOR CHEST PAIN. 06/20/19   Iran Ouch, Lennart Pall, PA-C  Omega-3 Fatty Acids (FISH OIL) 1000 MG CAPS Take 1,000 mg by mouth daily.    [provider]  rosuvastatin (CRESTOR) 5 MG tablet Take 1 tablet (5 mg total) by mouth daily. 12/31/19   Strader, Lennart Pall, PA-C  timolol (BETIMOL) 0.5 % ophthalmic solution Place 1 drop into the right eye 2 (two) times daily.     [provider]  timolol (TIMOPTIC) 0.5 % ophthalmic solution Place 1 drop into the right eye 2 (two) times daily. 12/16/19   [provider]  triamcinolone cream (KENALOG) 0.1 % Apply 1 application topically 2 (two) times daily. 07/03/20   Avegno, Zachery Dakins, FNP  vitamin B-12 (CYANOCOBALAMIN) 1000 MCG tablet Take 1,000 mcg by mouth at bedtime.     [provider]    Family History Family History  Problem Relation Age of Onset   Heart disease Sister    Heart disease Son     Social History Social History   Tobacco Use   Smoking status: Never   Smokeless tobacco: Never  Vaping Use   Vaping Use: Never used  Substance Use Topics   Alcohol use: No   Drug use: No     Allergies   Other, Prednisone, Statins, Erythromycin, and Tylenol [acetaminophen]   Review of Systems Review of Systems Pertinent negatives listed in HPI   Physical Exam Triage Vital Signs ED Triage Vitals  Enc Vitals Group     BP 11/08/20 1711 (!) 155/82     Pulse Rate 11/08/20 1711 79     Resp 11/08/20 1711 18     Temp 11/08/20 1711 98.5 F (36.9 C)     Temp Source 11/08/20 1711 Oral     SpO2 11/08/20 1711 95 %     Weight --      Height --      Head Circumference --      Peak Flow --      Pain Score 11/08/20 1712 9     Pain Loc --      Pain Edu? --      Excl. in GC? --    No data found.  Updated Vital Signs BP (!) 155/82 (BP Location: Right Arm)   Pulse 79   Temp  98.5 F (36.9 C) (Oral)   Resp 18   SpO2 95%   Visual Acuity Right Eye Distance:   Left Eye Distance:   Bilateral Distance:    Right Eye Near:   Left Eye Near:    Bilateral Near:     Physical Exam Constitutional:  Appearance: Normal appearance.  HENT:     Head: Normocephalic and atraumatic.  Eyes:     Extraocular Movements: Extraocular movements intact.     Pupils: Pupils are equal, round, and reactive to light.  Cardiovascular:     Rate and Rhythm: Normal rate and regular rhythm.  Pulmonary:     Effort: Pulmonary effort is normal.     Breath sounds: Normal breath sounds.  Musculoskeletal:     Cervical back: No rigidity.     Right lower leg: Edema present.     Left lower leg: Edema present.  Skin:    Capillary Refill: Capillary refill takes less than 2 seconds.  Neurological:     General: No focal deficit present.     Mental Status: She is alert and oriented to person, place, and time.  Psychiatric:        Mood and Affect: Mood normal.        Behavior: Behavior normal.        Thought Content: Thought content normal.        Judgment: Judgment normal.     UC Treatments / Results  Labs (all labs ordered are listed, but only abnormal results are displayed) Labs Reviewed - No data to display  EKG   Radiology No results found.  Procedures Procedures (including critical care time)  Medications Ordered in UC Medications - No data to display  Initial Impression / Assessment and Plan / UC Course  I have reviewed the triage vital signs and the nursing notes.  Pertinent labs & imaging results that were available during my care of the patient were reviewed by me and considered in my medical decision making (see chart for details).    Hemorrhoids and Constipation Hydrate well with water. Resume Miralax Anusol for hemorrhoid management Limit sodium and elevate feet when sitting for manage ankle swelling. ER precautions given. Follow-up with PCP as needed.   Final Clinical Impressions(s) / UC Diagnoses   Final diagnoses:  Hemorrhoids, unspecified hemorrhoid type  Constipation, unspecified constipation type   Discharge Instructions   None    ED Prescriptions     Medication Sig Dispense Auth. Provider   polyethylene glycol powder (MIRALAX) 17 GM/SCOOP powder Take 255 g by mouth once for 1 dose. 255 g Bing Neighbors, FNP   hydrocortisone (ANUSOL-HC) 2.5 % rectal cream Place 1 application rectally 2 (two) times daily. 60 g Bing Neighbors, FNP      PDMP not reviewed this encounter.   Bing Neighbors, [place] 11/14/20 2107

## 2020-11-08 NOTE — ED Triage Notes (Addendum)
Hemorrhoids that started on Friday. Using tucks pads with no relief.  Was told by heart Dr  not to use prep H d/t making her bp go up. Also swelling to bilateral ankles.

## 2020-11-16 DIAGNOSIS — H401131 Primary open-angle glaucoma, bilateral, mild stage: Secondary | ICD-10-CM | POA: Diagnosis not present

## 2020-12-06 ENCOUNTER — Other Ambulatory Visit: Payer: Self-pay | Admitting: Student

## 2021-01-05 ENCOUNTER — Encounter: Payer: Self-pay | Admitting: Nurse Practitioner

## 2021-01-05 ENCOUNTER — Other Ambulatory Visit: Payer: Self-pay

## 2021-01-05 ENCOUNTER — Ambulatory Visit: Payer: Medicare HMO | Admitting: Nurse Practitioner

## 2021-01-05 VITALS — BP 134/78 | HR 75 | Ht <= 58 in | Wt 158.0 lb

## 2021-01-05 DIAGNOSIS — I10 Essential (primary) hypertension: Secondary | ICD-10-CM

## 2021-01-05 DIAGNOSIS — I779 Disorder of arteries and arterioles, unspecified: Secondary | ICD-10-CM

## 2021-01-05 DIAGNOSIS — E785 Hyperlipidemia, unspecified: Secondary | ICD-10-CM

## 2021-01-05 DIAGNOSIS — I2510 Atherosclerotic heart disease of native coronary artery without angina pectoris: Secondary | ICD-10-CM

## 2021-01-05 DIAGNOSIS — I255 Ischemic cardiomyopathy: Secondary | ICD-10-CM

## 2021-01-05 NOTE — Patient Instructions (Signed)
Medication Instructions:  Your physician recommends that you continue on your current medications as directed. Please refer to the Current Medication list given to you today.  *If you need a refill on your cardiac medications before your next appointment, please call your pharmacy*   Lab Work: None If you have labs (blood work) drawn today and your tests are completely normal, you will receive your results only by: MyChart Message (if you have MyChart) OR A paper copy in the mail If you have any lab test that is abnormal or we need to change your treatment, we will call you to review the results.   Testing/Procedures: Your physician has requested that you have a carotid duplex. This test is an ultrasound of the carotid arteries in your neck. It looks at blood flow through these arteries that supply the brain with blood. Allow one hour for this exam. There are no restrictions or special instructions.    Follow-Up: At [facility], you and your health needs are our priority.  As part of our continuing mission to provide you with exceptional heart care, we have created designated Provider Care Teams.  These Care Teams include your primary Cardiologist (physician) and Advanced Practice Providers (APPs -  Physician Assistants and Nurse Practitioners) who all work together to provide you with the care you need, when you need it.  We recommend signing up for the patient portal called "MyChart".  Sign up information is provided on this After Visit Summary.  MyChart is used to connect with patients for Virtual Visits (Telemedicine).  Patients are able to view lab/test results, encounter notes, upcoming appointments, etc.  Non-urgent messages can be sent to your provider as well.   To learn more about what you can do with MyChart, go to [email or web address].    Your next appointment:   1 year(s)  The format for your next appointment:   In Person  Provider:   Establish with  Provider   Other Instructions

## 2021-01-05 NOTE — Progress Notes (Signed)
Office Visit    Patient Name: Sheryl Carter Date of Encounter: 01/05/2021  Primary Care Provider:  Elfredia Nevins, MD Primary Cardiologist:  Sheryl Docker, MD (Inactive)  Chief Complaint    80 year old female with a history of CAD status post four-vessel bypass in March 2018, hypertension, hyperlipidemia, carotid artery stenosis status post left CEA in March 2018, and prior strokes, presents for follow-up of CAD.  Past Medical History    Past Medical History:  Diagnosis Date   Anxiety    Blindness of left eye    CAD (coronary artery disease)    a. s/p CABG in 05/2016 with LIMA-LAD, SVG-D2, SVG-OM, SVG-RCA   Complication of anesthesia    has gas trapped in her body after   DVT (deep venous thrombosis) (HCC)    Ear problems    Family history of adverse reaction to anesthesia    daughter has gas trapped in her body postop as well   Glaucoma    Hypercholesterolemia    Hypertension    Ischemic cardiomyopathy    a. 05/2016 Echo: EF 50-55%, inf HK.   Pre-diabetes    pt. told by [facility] staff that she is prediabetic, HgbA1c was 5.4   Stroke (HCC)    having TIA's   Past Surgical History:  Procedure Laterality Date   BREAST SURGERY Left    cyst- L breast   CHOLECYSTECTOMY     CORONARY ARTERY BYPASS GRAFT N/A 06/09/2016   Procedure: CORONARY ARTERY BYPASS GRAFTING (CABG) times 4 using the left internal mammary artery and bilateral thigh greater saphenous veins harvested endoscopically. LIMA to LAD, SVG to DIAGONAL 2, SVG to OM, SVG to RCA;  Surgeon: Sheryl Ovens, MD;  Location: [facility] OR;  Service: Open Heart Surgery;  Laterality: N/A;   ENDARTERECTOMY Left 05/29/2016   Procedure: LEFT CAROTID ENDARTERECTOMY WITH PATCH ANGIOPLASTY;  Surgeon: Sheryl Earthly, MD;  Location: [facility] OR;  Service: Vascular;  Laterality: Left;   EYE SURGERY Right    cataract   LEFT HEART CATH AND CORONARY ANGIOGRAPHY N/A 06/07/2016   Procedure: Left Heart Cath and Coronary Angiography;  Surgeon:  Sheryl Ouch, MD;  Location: [facility] INVASIVE CV LAB;  Service: Cardiovascular;  Laterality: N/A;   TEE WITHOUT CARDIOVERSION N/A 06/09/2016   Procedure: TRANSESOPHAGEAL ECHOCARDIOGRAM (TEE);  Surgeon: Sheryl Ovens, MD;  Location: [facility] OR;  Service: Open Heart Surgery;  Laterality: N/A;    Allergies  Allergies  Allergen Reactions   Other Hives    Mycins-   Pt has Glaucoma in Right eye, Blind in Left eye .... PLEASE DO NOT GIVE ANYTHING TO PATIENT THAT MIGHT DESTROY VISION    Prednisone Other (See Comments)    Altered mental status   Statins Other (See Comments)    MYALGIAS Weakness, muscle aches, and pain   Erythromycin     UNSPECIFIED REACTION    Tylenol [Acetaminophen] Other (See Comments)    Hallucinations    History of Present Illness    80 year old female with the above past medical history including CAD, hypertension, hyperlipidemia, carotid artery stenosis, and prior strokes.  She was previously evaluated in March 2018 and found to have multivessel coronary artery disease as well as severe carotid disease.  She underwent a CABG x4 as well as a left carotid endarterectomy.  Postoperative echocardiogram showed an EF of 50 to 55% with inferior hypokinesis.  [name] was last seen via telemedicine visit in October 2021.  She was doing well at that time without  chest pain or dyspnea.  Recommendation was made for follow-up carotid ultrasound, which patient has not had yet (she no longer follows with vascular surgery).  Over the last year, she notes that she has been stable.  She lives by herself though her son and daughter are nearby.  Due to left eye blindness, she relies on her children for transportation.  Activity is very limited.  She uses a cane to ambulate and does not routinely exercise.  She does have dyspnea on exertion which she notes is chronic and stable.  She does not experience chest pain and denies palpitations, PND, orthopnea, dizziness, syncope, or early satiety.   She sometimes notes mild lower extremity/ankle swelling.  Home Medications    Current Outpatient Medications  Medication Sig Dispense Refill   ALPRAZolam (XANAX) 0.25 MG tablet Take 0.25 mg by mouth 2 (two) times daily. *May take one additional dose at lunch as needed     amLODipine (NORVASC) 5 MG tablet Take 1 tablet (5 mg total) by mouth daily. 90 tablet 3   aspirin EC 81 MG tablet Take 1 tablet (81 mg total) by mouth daily with breakfast. 120 tablet 2   fexofenadine (ALLEGRA) 180 MG tablet Take 180 mg by mouth daily.     hydrocortisone (ANUSOL-HC) 2.5 % rectal cream Place 1 application rectally 2 (two) times daily. 60 g 0   isosorbide mononitrate (IMDUR) 30 MG 24 hr tablet TAKE 1/2 TABLET BY MOUTH DAILY. 45 tablet 0   nitroGLYCERIN (NITROSTAT) 0.4 MG SL tablet PLACE 1 TABLET UNDER TONGUE EVERY 5 MINUTES AS NEEDED FOR CHEST PAIN. 25 tablet 0   Omega-3 Fatty Acids (FISH OIL) 1000 MG CAPS Take 1,000 mg by mouth daily.     rosuvastatin (CRESTOR) 5 MG tablet Take 1 tablet (5 mg total) by mouth daily. 90 tablet 3   timolol (BETIMOL) 0.5 % ophthalmic solution Place 1 drop into the right eye 2 (two) times daily.      triamcinolone cream (KENALOG) 0.1 % Apply 1 application topically 2 (two) times daily. 30 g 0   vitamin B-12 (CYANOCOBALAMIN) 1000 MCG tablet Take 1,000 mcg by mouth at bedtime.      timolol (TIMOPTIC) 0.5 % ophthalmic solution Place 1 drop into the right eye 2 (two) times daily. (Patient not taking: Reported on 01/05/2021)     No current facility-administered medications for this visit.     Review of Systems    Chronic dyspnea on exertion, which is stable.  Occasional ankle swelling for which, she will keep her legs elevated.  She denies chest pain, palpitations, PND, orthopnea, dizziness, syncope, or early satiety.  All other systems reviewed and are otherwise negative except as noted above.  Physical Exam    VS:  BP 134/78   Pulse 75   Ht 4' 7.5" (1.41 m)   Wt 158 lb (71.7  kg)   SpO2 98%   BMI 36.06 kg/m  , BMI Body mass index is 36.06 kg/m.     GEN: Obese, in no acute distress. HEENT: normal. Neck: Supple, no JVD, carotid bruits, or masses. Cardiac: RRR, no murmurs, rubs, or gallops. No clubbing, cyanosis, trace bilateral ankle edema.  Radials/PT 2+ and equal bilaterally.  Respiratory:  Respirations regular and unlabored, clear to auscultation bilaterally. GI: Soft, nontender, nondistended, BS + x 4. MS: no deformity or atrophy. Skin: warm and dry, no rash. Neuro:  Strength and sensation are intact. Psych: Normal affect.  Accessory Clinical Findings    ECG personally reviewed  by me today -regular sinus rhythm, 74, mild inferolateral ST depression with associated T changes- no acute changes.  Lab Results  Component Value Date   WBC 5.4 03/18/2019   HGB 12.9 03/18/2019   HCT 36.6 03/18/2019   MCV 93.4 03/18/2019   PLT 180 03/18/2019   Lab Results  Component Value Date   CREATININE 0.58 03/18/2019   BUN 9 03/18/2019   NA 132 (L) 03/18/2019   K 3.9 03/18/2019   CL 97 (L) 03/18/2019   CO2 27 03/18/2019   Lab Results  Component Value Date   ALT 25 03/18/2019   AST 29 03/18/2019   ALKPHOS 63 03/18/2019   BILITOT 1.0 03/18/2019   Lab Results  Component Value Date   CHOL 157 12/29/2016   HDL 51 12/29/2016   LDLCALC 87 12/29/2016   TRIG 97 12/29/2016   CHOLHDL 5.7 (H) 09/29/2016    Lab Results  Component Value Date   HGBA1C 5.4 06/08/2016    Assessment & Plan    1.  Coronary artery disease: Status post CABG x4 in March 2018.  She has chronic dyspnea on exertion but does not experience chest pain.  She is sedentary and uses a cane to ambulate at home.  Her weight is up 23 pounds over the past year, which she attributes to being sedentary.  I encouraged her to seek out more regular activity.  She remains on aspirin, statin, and nitrate therapy.  2.  Ischemic cardiomyopathy: EF 50 to 55% with inferior hypokinesis by echo following  surgery in March 2018.  She is euvolemic on examination with only trace ankle edema, which she attributes to sitting all day.  Blood pressure is mildly elevated though she notes that it is always normal when she sees her primary care provider and that she was anxious coming here today.  I have asked her to follow this more closely at home.  3.  Essential hypertension: As noted above, blood pressure mildly elevated today at 134/78.  She notes that it is usually normal when she sees her primary care provider.  I have asked her to follow her blood pressure more closely at home though she says that this usually makes her very very anxious.  She remains on amlodipine and isosorbide therapy.  4.  Hyperlipidemia: This followed by her primary care provider.  Statin dose limited by myalgias on higher doses previously.  Continue rosuvastatin and fish oil.  5.  Carotid arterial disease: Status post left CEA in March 2018.  She has not had follow-up imaging since October 2018.  She no longer follows with vascular surgery.  I will arrange for follow-up carotid ultrasound.  Continue aspirin and statin therapy.  6.  Disposition: Follow-up carotid ultrasound.  Follow-up in cardiology clinic in 1 year or sooner if necessary.   Nicolasa Ducking, NP 01/05/2021, 4:23 PM

## 2021-01-06 NOTE — Addendum Note (Signed)
Addended by: Roseanne Reno on: 01/06/2021 07:24 AM   Modules accepted: Orders

## 2021-01-13 ENCOUNTER — Ambulatory Visit (HOSPITAL_COMMUNITY): Payer: Medicare HMO

## 2021-01-25 ENCOUNTER — Other Ambulatory Visit: Payer: Self-pay

## 2021-01-25 ENCOUNTER — Ambulatory Visit (HOSPITAL_COMMUNITY)
Admission: RE | Admit: 2021-01-25 | Discharge: 2021-01-25 | Disposition: A | Discharge status: Home / Self Care | Payer: Medicare HMO | Source: Ambulatory Visit | Attending: Nurse Practitioner | Admitting: Nurse Practitioner

## 2021-01-25 DIAGNOSIS — I10 Essential (primary) hypertension: Secondary | ICD-10-CM | POA: Diagnosis not present

## 2021-01-25 DIAGNOSIS — I779 Disorder of arteries and arterioles, unspecified: Secondary | ICD-10-CM | POA: Diagnosis not present

## 2021-01-25 DIAGNOSIS — I6523 Occlusion and stenosis of bilateral carotid arteries: Secondary | ICD-10-CM | POA: Diagnosis not present

## 2021-01-25 DIAGNOSIS — E785 Hyperlipidemia, unspecified: Secondary | ICD-10-CM | POA: Diagnosis not present

## 2021-01-31 ENCOUNTER — Emergency Department (HOSPITAL_COMMUNITY)
Admission: EM | Admit: 2021-01-31 | Discharge: 2021-02-01 | Disposition: A | Discharge status: Home / Self Care | Payer: Medicare HMO | Attending: Emergency Medicine | Admitting: Emergency Medicine

## 2021-01-31 ENCOUNTER — Encounter (HOSPITAL_COMMUNITY): Payer: Self-pay

## 2021-01-31 ENCOUNTER — Other Ambulatory Visit: Payer: Self-pay

## 2021-01-31 ENCOUNTER — Ambulatory Visit
Admission: EM | Admit: 2021-01-31 | Discharge: 2021-01-31 | Disposition: A | Discharge status: Home / Self Care | Payer: Medicare HMO | Attending: Urgent Care | Admitting: Urgent Care

## 2021-01-31 ENCOUNTER — Emergency Department (HOSPITAL_COMMUNITY): Payer: Medicare HMO

## 2021-01-31 DIAGNOSIS — Z6841 Body Mass Index (BMI) 40.0 and over, adult: Secondary | ICD-10-CM | POA: Diagnosis not present

## 2021-01-31 DIAGNOSIS — R072 Precordial pain: Secondary | ICD-10-CM | POA: Insufficient documentation

## 2021-01-31 DIAGNOSIS — Z8673 Personal history of transient ischemic attack (TIA), and cerebral infarction without residual deficits: Secondary | ICD-10-CM

## 2021-01-31 DIAGNOSIS — R0602 Shortness of breath: Secondary | ICD-10-CM | POA: Insufficient documentation

## 2021-01-31 DIAGNOSIS — I10 Essential (primary) hypertension: Secondary | ICD-10-CM | POA: Insufficient documentation

## 2021-01-31 DIAGNOSIS — Z7982 Long term (current) use of aspirin: Secondary | ICD-10-CM | POA: Diagnosis not present

## 2021-01-31 DIAGNOSIS — I25110 Atherosclerotic heart disease of native coronary artery with unstable angina pectoris: Secondary | ICD-10-CM

## 2021-01-31 DIAGNOSIS — Z951 Presence of aortocoronary bypass graft: Secondary | ICD-10-CM

## 2021-01-31 DIAGNOSIS — R0789 Other chest pain: Secondary | ICD-10-CM

## 2021-01-31 DIAGNOSIS — I2510 Atherosclerotic heart disease of native coronary artery without angina pectoris: Secondary | ICD-10-CM | POA: Diagnosis not present

## 2021-01-31 DIAGNOSIS — R630 Anorexia: Secondary | ICD-10-CM | POA: Insufficient documentation

## 2021-01-31 DIAGNOSIS — R42 Dizziness and giddiness: Secondary | ICD-10-CM | POA: Diagnosis not present

## 2021-01-31 DIAGNOSIS — R079 Chest pain, unspecified: Secondary | ICD-10-CM | POA: Diagnosis not present

## 2021-01-31 DIAGNOSIS — R519 Headache, unspecified: Secondary | ICD-10-CM | POA: Insufficient documentation

## 2021-01-31 DIAGNOSIS — M79602 Pain in left arm: Secondary | ICD-10-CM

## 2021-01-31 DIAGNOSIS — Z86718 Personal history of other venous thrombosis and embolism: Secondary | ICD-10-CM

## 2021-01-31 DIAGNOSIS — I252 Old myocardial infarction: Secondary | ICD-10-CM

## 2021-01-31 DIAGNOSIS — R600 Localized edema: Secondary | ICD-10-CM | POA: Insufficient documentation

## 2021-01-31 LAB — BASIC METABOLIC PANEL
Anion gap: 5 (ref 5–15)
BUN: 8 mg/dL (ref 8–23)
CO2: 27 mmol/L (ref 22–32)
Calcium: 9.1 mg/dL (ref 8.9–10.3)
Chloride: 106 mmol/L (ref 98–111)
Creatinine, Ser: 0.61 mg/dL (ref 0.44–1.00)
GFR, Estimated: >60 mL/min (ref >60)
Glucose, Bld: 98 mg/dL (ref 70–99)
Potassium: 3.4 mmol/L — ABNORMAL LOW (ref 3.5–5.1)
Sodium: 138 mmol/L (ref 135–145)

## 2021-01-31 LAB — TROPONIN I (HIGH SENSITIVITY)
Troponin I (High Sensitivity): 7 ng/L (ref <18)
Troponin I (High Sensitivity): 7 ng/L (ref <18)

## 2021-01-31 LAB — CBC
HCT: 41.3 % (ref 36.0–46.0)
Hemoglobin: 14 g/dL (ref 12.0–15.0)
MCH: 33.1 pg (ref 26.0–34.0)
MCHC: 33.9 g/dL (ref 30.0–36.0)
MCV: 97.6 fL (ref 80.0–100.0)
Platelets: 178 10*3/uL (ref 150–400)
RBC: 4.23 MIL/uL (ref 3.87–5.11)
RDW: 12.1 % (ref 11.5–15.5)
WBC: 4.8 10*3/uL (ref 4.0–10.5)
nRBC: 0 % (ref 0.0–0.2)

## 2021-01-31 MED ORDER — TIMOLOL MALEATE 0.5 % OP SOLN
1.0000 [drp] | Freq: Once | OPHTHALMIC | Status: AC
  Administered 2021-01-31: 1 [drp] via OPHTHALMIC
  Filled 2021-01-31: qty 5

## 2021-01-31 MED ORDER — FUROSEMIDE 20 MG PO TABS: 20.0000 mg | ORAL_TABLET | Freq: Every day | ORAL | 0 refills | Status: AC

## 2021-01-31 NOTE — Discharge Instructions (Signed)
Please take your mother to the hospital now for further heart testing given her current chest pain that goes to her left arm, history of NSTEMI, CABGx4, CAD.

## 2021-01-31 NOTE — ED Provider Notes (Signed)
[facility] EMERGENCY DEPARTMENT Provider Note   CSN: [id number] Arrival date & time: 01/31/21  1824     History Chief Complaint  Patient presents with   Chest Pain    Sheryl Carter is a 80 y.o. female with a past medical history of NSTEMI, CAD, CABG x4, hypertension, TIA, DVT presents to the emergency department with acute, sudden, persistent, midsternal chest pain onset 2 days ago.  Patient reports that her chest pain is an 8/10 at its worse. Patient midsternal chest pain radiates to her left arm and intermittently to her jaw.  Her chest pain typically last 5-10 minutes and she noticed that her chest pain initially while at rest. She denies chest pain at this time.  Patient has not had to take any of her nitroglycerin.  She has not tried any medications for her symptoms. Patient was seen in urgent care today to follow-up in the ED for further evaluation.  She noted indigestion 1 week prior to her chest pain.  Patient started taking Prilosec again 1 week ago. She has associated bilateral lower extremity swelling, decreased appetite, generalized weakness, lightheadedness, shortness of breath, and headache.  She has had left lower leg swelling x1 week and noticed her right lower leg swelling this morning.  At the discretion of her primary care provider she took hydrochlorothiazide this morning. She denies shortness of breath, abdominal pain, nausea, vomiting, diarrhea, dizziness, fever, chills, or cough.  She denies sick contacts, recent travel, or anticoagulant use.   The history is provided by the patient and a relative (daughter). No language interpreter was used.  Chest Pain Pain location:  Substernal area Pain quality comment:  Soreness Pain radiates to:  Does not radiate Pain severity:  Mild Onset quality:  Sudden Timing:  Intermittent Relieved by:  None tried Worsened by:  Nothing Ineffective treatments:  None tried Associated symptoms: cough, headache and shortness of breath    Associated symptoms: no abdominal pain, no dizziness, no fever, no nausea, no numbness, no vomiting and no weakness       Past Medical History:  Diagnosis Date   Anxiety    Blindness of left eye    CAD (coronary artery disease)    a. s/p CABG in 05/2016 with LIMA-LAD, SVG-D2, SVG-OM, SVG-RCA   Complication of anesthesia    has gas trapped in her body after   DVT (deep venous thrombosis) (HCC)    Ear problems    Family history of adverse reaction to anesthesia    daughter has gas trapped in her body postop as well   Glaucoma    Hypercholesterolemia    Hypertension    Ischemic cardiomyopathy    a. 05/2016 Echo: EF 50-55%, inf HK.   Pre-diabetes    pt. 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/19/2016   Coronary artery disease    NSTEMI (non-ST elevated myocardial infarction) (HCC) 06/07/2016   Paresthesia    TIA (transient ischemic attack) 06/04/2016   Symptomatic carotid artery stenosis, left 05/29/2016   Slurred speech 04/18/2016   Hypertension, essential 04/18/2016   Hyperlipidemia 04/18/2016   Chronic anxiety 04/18/2016    Past Surgical History:  Procedure Laterality Date  BREAST SURGERY Left    cyst- L breast   CHOLECYSTECTOMY     CORONARY ARTERY BYPASS GRAFT N/A 06/09/2016   Procedure: CORONARY ARTERY BYPASS GRAFTING (CABG) times 4 using the left internal mammary artery and bilateral thigh greater saphenous veins harvested endoscopically. LIMA to LAD, SVG to DIAGONAL 2, SVG to OM, SVG to RCA;  Surgeon: Delight Ovens, MD;  Location: [facility] OR;  Service: Open Heart Surgery;  Laterality: N/A;   ENDARTERECTOMY Left 05/29/2016   Procedure: LEFT CAROTID ENDARTERECTOMY WITH PATCH ANGIOPLASTY;  Surgeon: Larina Earthly, MD;  Location: [facility] OR;  Service: Vascular;  Laterality: Left;   EYE SURGERY Right    cataract   LEFT HEART CATH AND CORONARY ANGIOGRAPHY N/A 06/07/2016   Procedure: Left Heart Cath and Coronary Angiography;  Surgeon: Iran Ouch, MD;  Location: [facility] INVASIVE CV LAB;  Service: Cardiovascular;  Laterality: N/A;   TEE WITHOUT CARDIOVERSION N/A 06/09/2016   Procedure: TRANSESOPHAGEAL ECHOCARDIOGRAM (TEE);  Surgeon: Delight Ovens, MD;  Location: [facility] OR;  Service: Open Heart Surgery;  Laterality: N/A;     OB History   No obstetric history on file.     Family History  Problem Relation Age of Onset   Heart disease Sister    Heart disease Son     Social History   Tobacco Use   Smoking status: Never   Smokeless tobacco: Never  Vaping Use   Vaping Use: Never used  Substance Use Topics   Alcohol use: No   Drug use: No    Home Medications Prior to Admission medications   Medication Sig Start Date End Date Taking? Authorizing Provider  furosemide (LASIX) 20 MG tablet Take 1 tablet (20 mg total) by mouth daily. 01/31/21  Yes Daison Braxton A, PA  ALPRAZolam (XANAX) 0.25 MG tablet Take 0.25 mg by mouth 2 (two) times daily. *May take one additional dose at lunch as needed    [provider]  amLODipine (NORVASC) 5 MG tablet Take 1 tablet (5 mg total) by mouth daily. 12/31/19 01/05/21  Strader, Lennart Pall, PA-C  aspirin EC 81 MG tablet Take 1 tablet (81 mg total) by mouth daily with breakfast. 03/18/19   Shon Hale, MD  fexofenadine (ALLEGRA) 180 MG tablet Take 180 mg by mouth daily.    [provider]  hydrocortisone (ANUSOL-HC) 2.5 % rectal cream Place 1 application rectally 2 (two) times daily. 11/08/20   Bing Neighbors, FNP  isosorbide mononitrate (IMDUR) 30 MG 24 hr tablet TAKE 1/2 TABLET BY MOUTH DAILY. 12/06/20   Strader, Lennart Pall, PA-C  nitroGLYCERIN (NITROSTAT) 0.4 MG SL tablet PLACE 1 TABLET UNDER TONGUE EVERY 5 MINUTES AS NEEDED FOR CHEST PAIN.  06/20/19   Iran Ouch, Lennart Pall, PA-C  Omega-3 Fatty Acids (FISH OIL) 1000 MG CAPS Take 1,000 mg by mouth daily.    [provider]  rosuvastatin (CRESTOR) 5 MG tablet Take 1 tablet (5 mg total) by mouth daily. 12/31/19   Strader, Lennart Pall, PA-C  timolol (BETIMOL) 0.5 % ophthalmic solution Place 1 drop into the right eye 2 (two) times daily.     [provider]  timolol (TIMOPTIC) 0.5 % ophthalmic solution Place 1 drop into the right eye 2 (two) times daily. Patient not taking: Reported on 01/05/2021 12/16/19   [provider]  triamcinolone cream (KENALOG) 0.1 % Apply 1 application topically 2 (two) times daily. 07/03/20   Avegno, Zachery Dakins, FNP  vitamin B-12 (CYANOCOBALAMIN) 1000 MCG tablet Take 1,000 mcg  by mouth at bedtime.     [provider]    Allergies    Other, Prednisone, Statins, Erythromycin, and Tylenol [acetaminophen]  Review of Systems   Review of Systems  Constitutional:  Negative for chills and fever.  Respiratory:  Positive for cough and shortness of breath.   Cardiovascular:  Positive for chest pain and leg swelling.  Gastrointestinal:  Negative for abdominal pain, nausea and vomiting.  Skin:  Negative for rash.  Neurological:  Positive for light-headedness and headaches. Negative for dizziness, weakness and numbness.   Physical Exam Updated Vital Signs BP (!) 142/71   Pulse (!) 57   Temp 98 F (36.7 C) (Oral)   Resp 17   Ht 4\' 7"  (1.397 m)   Wt 81.6 kg   SpO2 95%   BMI 41.84 kg/m   Physical Exam Vitals and nursing note reviewed.  Constitutional:      General: She is not in acute distress.    Appearance: She is not diaphoretic.  HENT:     Head: Normocephalic and atraumatic.     Mouth/Throat:     Pharynx: No oropharyngeal exudate.  Eyes:     General: No scleral icterus.    Conjunctiva/sclera: Conjunctivae normal.  Cardiovascular:     Rate and Rhythm: Normal rate and regular rhythm.     Pulses: Normal pulses.      Heart sounds: Normal heart sounds.  Pulmonary:     Effort: Pulmonary effort is normal. No respiratory distress.     Breath sounds: Normal breath sounds. No wheezing.  Abdominal:     General: Bowel sounds are normal.     Palpations: Abdomen is soft. There is no mass.     Tenderness: There is no abdominal tenderness. There is no guarding or rebound.  Musculoskeletal:        General: Normal range of motion.     Cervical back: Normal range of motion and neck supple.     Right lower leg: No deformity, tenderness or bony tenderness. 1+ Edema present.     Left lower leg: No deformity, tenderness or bony tenderness. 1+ Edema present.     Comments: 1+ edema noted to bilateral lower extremities up to the calf.  No overlying skin changes, obvious deformities, ecchymosis, erythema. Sensation intact to bilateral upper and lower extremities.  Skin:    General: Skin is warm and dry.  Neurological:     Mental Status: She is alert.  Psychiatric:        Behavior: Behavior normal.    ED Results / Procedures / Treatments   Labs (all labs ordered are listed, but only abnormal results are displayed) Labs Reviewed  BASIC METABOLIC PANEL - Abnormal; Notable for the following components:      Result Value   Potassium 3.4 (*)    All other components within normal limits  CBC  TROPONIN I (HIGH SENSITIVITY)  TROPONIN I (HIGH SENSITIVITY)    EKG None  Radiology DG Chest Port 1 View  Result Date: 01/31/2021 CLINICAL DATA:  Chest pain EXAM: PORTABLE CHEST 1 VIEW.  Patient is rotated. COMPARISON:  Chest x-ray 09/02/2017 FINDINGS: The heart and mediastinal contours are unchanged. Aortic calcification. Surgical changes overlie the mediastinum. Elevated left hemidiaphragm. No focal consolidation. No pulmonary edema. No pleural effusion. No pneumothorax. No acute osseous abnormality. IMPRESSION: No active disease. Electronically Signed   By: 11/02/2017 M.D.   On: 01/31/2021 21:23     Procedures Procedures   Medications Ordered in ED Medications  timolol (TIMOPTIC) 0.5 % ophthalmic solution 1 drop (1 drop Right Eye Given 01/31/21 2100)    ED Course  I have reviewed the triage vital signs and the nursing notes.  Pertinent labs & imaging results that were available during my care of the patient were reviewed by me and considered in my medical decision making (see chart for details).   Clinical Course as of 01/31/21 2337  Mon Jan 31, 2021  2300 Pt reevaluated.  Patient with mild sternal chest soreness.  Patient in no acute distress and resting comfortably on stretcher at this time. [SB]    Clinical Course User Index [SB] Noha Karasik A, PA   MDM Rules/Calculators/A&P                           Patient presents with 2 days of midsternal, persistent chest pain.  Patient evaluated in urgent care today prior to arrival.  Patient with extensive cardiac history.  On exam patient without acute cardiac or respiratory abnormalities.  Mild sternal chest wall tenderness to palpation. No JVD, no murmur, RRR, and breath sounds equal bilaterally.  Differential diagnosis includes MI, PE, musculoskeletal concern, or GERD. Due to patient initial presentation to urgent care today and extensive cardiac history cardiac work-up obtained.  EKG without acute abnormalities.  Chest x-ray obtained and no acute abnormalities noted.  Both troponins within normal limits.  Labs obtained and no acute findings. EKG, chest x-ray, troponins within normal limits without acute findings, less likely MI.  Patient not tachycardic, no recent travel, prolonged sitting, recent surgeries, less likely PE at this time. No recent sick contacts.   Patient to be discharged with recommendation to maintain scheduled follow-up with PCP in 2 days in regards to today's ED visit.  Patient and daughter acknowledged and voiced understanding. Discussed with patient to discontinue hydrochlorothiazide at this time. Will  prescribe Lasix 20 mg daily due to leg swelling. Case has been discussed with and seen by attending who agrees with the above plan to discharge.   Strict return precautions discussed with patient and daughter at this time.  Patient appears safe for discharge at this time.  Follow-up as indicated in discharge for work.  Final Clinical Impression(s) / ED Diagnoses Final diagnoses:  Other chest pain    Rx / DC Orders ED Discharge Orders          Ordered    furosemide (LASIX) 20 MG tablet  Daily        01/31/21 2329             Sarahi Borland A, [place] 01/31/21 2338    Eber Hong, MD 02/03/21 1249

## 2021-01-31 NOTE — ED Triage Notes (Signed)
Patient with chest pain and radiation down left arm for the past two days. HX of MI. Reports SOB. Reports posterior head pain that started two days ago as well.

## 2021-01-31 NOTE — Discharge Instructions (Addendum)
Discontinue hydrochlorothiazide at this time.  Start taking Lasix as prescribed.  Maintain scheduled follow-up with primary care provider in 2 days.  Return to the emergency department if you are experiencing increasing or worsening chest pain, shortness of breath,

## 2021-01-31 NOTE — ED Triage Notes (Signed)
She was taken off of her HCTZ approximately a year ago. She had 12.5 mg HCTZ left over, she was told to take one per day by PCP office. She took this at noon.

## 2021-01-31 NOTE — ED Provider Notes (Signed)
[place]   MRN: [id number] DOB: 06-26-1940  Subjective:   Sheryl Carter is a 80 y.o. female with PMH of NSTEMI, CAD, CABG x4, HTN, TIA, DVT presenting for 2-day history of acute onset persistent midsternal chest pain that is moderate in nature, started to have left arm pain here in the clinic.  Has also had bilateral lower leg swelling, lower ankle swelling, left worse than right.  She contacted her PCP who advised that she take hydrochlorothiazide.  She did so but this has not changed her symptoms much.  Last echocardiogram was from March 2018, had an ejection fraction of 50 to 55%.  No current facility-administered medications for this encounter.  Current Outpatient Medications:    ALPRAZolam (XANAX) 0.25 MG tablet, Take 0.25 mg by mouth 2 (two) times daily. *May take one additional dose at lunch as needed, Disp: , Rfl:    amLODipine (NORVASC) 5 MG tablet, Take 1 tablet (5 mg total) by mouth daily., Disp: 90 tablet, Rfl: 3   aspirin EC 81 MG tablet, Take 1 tablet (81 mg total) by mouth daily with breakfast., Disp: 120 tablet, Rfl: 2   fexofenadine (ALLEGRA) 180 MG tablet, Take 180 mg by mouth daily., Disp: , Rfl:    hydrocortisone (ANUSOL-HC) 2.5 % rectal cream, Place 1 application rectally 2 (two) times daily., Disp: 60 g, Rfl: 0   isosorbide mononitrate (IMDUR) 30 MG 24 hr tablet, TAKE 1/2 TABLET BY MOUTH DAILY., Disp: 45 tablet, Rfl: 0   nitroGLYCERIN (NITROSTAT) 0.4 MG SL tablet, PLACE 1 TABLET UNDER TONGUE EVERY 5 MINUTES AS NEEDED FOR CHEST PAIN., Disp: 25 tablet, Rfl: 0   Omega-3 Fatty Acids (FISH OIL) 1000 MG CAPS, Take 1,000 mg by mouth daily., Disp: , Rfl:    rosuvastatin (CRESTOR) 5 MG tablet, Take 1 tablet (5 mg total) by mouth daily., Disp: 90 tablet, Rfl: 3   timolol (BETIMOL) 0.5 % ophthalmic solution, Place 1 drop into the right eye 2 (two) times daily. , Disp: , Rfl:    timolol (TIMOPTIC) 0.5 % ophthalmic solution, Place 1 drop into the right eye 2  (two) times daily. (Patient not taking: Reported on 01/05/2021), Disp: , Rfl:    triamcinolone cream (KENALOG) 0.1 %, Apply 1 application topically 2 (two) times daily., Disp: 30 g, Rfl: 0   vitamin B-12 (CYANOCOBALAMIN) 1000 MCG tablet, Take 1,000 mcg by mouth at bedtime. , Disp: , Rfl:    Allergies  Allergen Reactions   Other Hives    Mycins-   Pt has Glaucoma in Right eye, Blind in Left eye .... PLEASE DO NOT GIVE ANYTHING TO PATIENT THAT MIGHT DESTROY VISION    Prednisone Other (See Comments)    Altered mental status   Statins Other (See Comments)    MYALGIAS Weakness, muscle aches, and pain   Erythromycin     UNSPECIFIED REACTION    Tylenol [Acetaminophen] Other (See Comments)    Hallucinations    Past Medical History:  Diagnosis Date   Anxiety    Blindness of left eye    CAD (coronary artery disease)    a. s/p CABG in 05/2016 with LIMA-LAD, SVG-D2, SVG-OM, SVG-RCA   Complication of anesthesia    has gas trapped in her body after   DVT (deep venous thrombosis) (HCC)    Ear problems    Family history of adverse reaction to anesthesia    daughter has gas trapped in her body postop as well   Glaucoma    Hypercholesterolemia  Hypertension    Ischemic cardiomyopathy    a. 05/2016 Echo: EF 50-55%, inf HK.   Pre-diabetes    pt. told by [facility] staff that she is prediabetic, HgbA1c was 5.4   Stroke (HCC)    having TIA's     Past Surgical History:  Procedure Laterality Date   BREAST SURGERY Left    cyst- L breast   CHOLECYSTECTOMY     CORONARY ARTERY BYPASS GRAFT N/A 06/09/2016   Procedure: CORONARY ARTERY BYPASS GRAFTING (CABG) times 4 using the left internal mammary artery and bilateral thigh greater saphenous veins harvested endoscopically. LIMA to LAD, SVG to DIAGONAL 2, SVG to OM, SVG to RCA;  Surgeon: Delight Ovens, MD;  Location: [facility] OR;  Service: Open Heart Surgery;  Laterality: N/A;   ENDARTERECTOMY Left 05/29/2016   Procedure: LEFT CAROTID ENDARTERECTOMY WITH  PATCH ANGIOPLASTY;  Surgeon: Larina Earthly, MD;  Location: [facility] OR;  Service: Vascular;  Laterality: Left;   EYE SURGERY Right    cataract   LEFT HEART CATH AND CORONARY ANGIOGRAPHY N/A 06/07/2016   Procedure: Left Heart Cath and Coronary Angiography;  Surgeon: Iran Ouch, MD;  Location: [facility] INVASIVE CV LAB;  Service: Cardiovascular;  Laterality: N/A;   TEE WITHOUT CARDIOVERSION N/A 06/09/2016   Procedure: TRANSESOPHAGEAL ECHOCARDIOGRAM (TEE);  Surgeon: Delight Ovens, MD;  Location: [facility] OR;  Service: Open Heart Surgery;  Laterality: N/A;    Family History  Problem Relation Age of Onset   Heart disease Sister    Heart disease Son     Social History   Tobacco Use   Smoking status: Never   Smokeless tobacco: Never  Vaping Use   Vaping Use: Never used  Substance Use Topics   Alcohol use: No   Drug use: No    ROS   Objective:   Vitals: BP (!) 162/98 (BP Location: Right Arm)   Pulse 72   Temp 98 F (36.7 C) (Oral)   Resp 16   SpO2 94%   Physical Exam Constitutional:      General: She is not in acute distress.    Appearance: Normal appearance. She is well-developed. She is not ill-appearing, toxic-appearing or diaphoretic.  HENT:     Head: Normocephalic and atraumatic.     Nose: Nose normal.     Mouth/Throat:     Mouth: Mucous membranes are moist.  Eyes:     Extraocular Movements: Extraocular movements intact.     Pupils: Pupils are equal, round, and reactive to light.  Cardiovascular:     Rate and Rhythm: Normal rate and regular rhythm.     Pulses: Normal pulses.     Heart sounds: Normal heart sounds. No murmur heard.   No friction rub. No gallop.  Pulmonary:     Effort: Pulmonary effort is normal. No respiratory distress.     Breath sounds: Normal breath sounds. No stridor. No wheezing, rhonchi or rales.  Chest:     Chest wall: No tenderness.  Abdominal:     General: Bowel sounds are normal. There is no distension.     Palpations: Abdomen is soft. There  is no mass.     Tenderness: There is no abdominal tenderness. There is no guarding or rebound.  Musculoskeletal:     Right lower leg: Edema (1+) present.     Left lower leg: Edema (1+ up to distal calf) present.  Skin:    General: Skin is warm and dry.     Findings: No rash.  Neurological:     Mental Status: She is alert and oriented to person, place, and time.  Psychiatric:        Mood and Affect: Mood normal.        Behavior: Behavior normal.        Thought Content: Thought content normal.        Judgment: Judgment normal.   ED ECG REPORT   Date: 01/31/2021  EKG Time: 5:59 PM  Rate: 59bpm  Rhythm: normal sinus rhythm,  normal EKG, normal sinus rhythm  Axis: normal  Intervals:none  ST&T Change: Nonspecific T wave flattening in III, aVF  Narrative Interpretation: Improved from previous EKG from 01/06/2021 that had inverted T waves in inferior lateral leads.  Otherwise, sinus rhythm at 59 bpm with nonspecific T wave changes above.   US Carotid Duplex Bilateral  Result Date: 01/25/2021 CLINICAL DATA:  Hypertension and hyperlipidemia, carotid atherosclerosis EXAM: BILATERAL CAROTID DUPLEX ULTRASOUND TECHNIQUE: Wallace Cullens scale imaging, color Doppler and duplex ultrasound were performed of bilateral carotid and vertebral arteries in the neck. COMPARISON:  04/19/2016 FINDINGS: Criteria: Quantification of carotid stenosis is based on velocity parameters that correlate the residual internal carotid diameter with NASCET-based stenosis levels, using the diameter of the distal internal carotid lumen as the denominator for stenosis measurement. The following velocity measurements were obtained: RIGHT ICA: 73/21 cm/sec CCA: 81/13 cm/sec SYSTOLIC ICA/CCA RATIO:  0.9 ECA: 81 cm/sec LEFT ICA: 67/24 cm/sec CCA: 51/11 cm/sec SYSTOLIC ICA/CCA RATIO:  1.4 ECA: 65 cm/sec RIGHT CAROTID ARTERY: Minor echogenic shadowing plaque formation. No hemodynamically significant right ICA stenosis, velocity elevation, or  turbulent flow. Degree of narrowing less than 50%. RIGHT VERTEBRAL ARTERY:  Normal antegrade flow LEFT CAROTID ARTERY: Minor intimal thickening and trace hypoechoic plaque formation. No hemodynamically significant left ICA stenosis, velocity elevation, or turbulent flow. LEFT VERTEBRAL ARTERY:  Normal antegrade flow IMPRESSION: Minor carotid atherosclerosis. Negative for stenosis. Degree of narrowing less than 50% bilaterally by ultrasound criteria. Patent antegrade vertebral flow bilaterally Electronically Signed   By: Judie Petit.  Shick M.D.   On: 01/25/2021 11:40     Assessment and Plan :   PDMP not reviewed this encounter.  1. Mid sternal chest pain   2. Left arm pain   3. Coronary artery disease involving native heart with unstable angina pectoris, unspecified vessel or lesion type (HCC)   4. Hx of CABG   5. History of DVT (deep vein thrombosis)   6. History of transient ischemic attack (TIA)   7. History of non-ST elevation myocardial infarction (NSTEMI)    Patient is high risk for cardiopulmonary event.  Her EKG today does not show active STEMI but she does have a history of NSTEMI and given her midsternal chest pain that radiates to the left arm emphasized need for further evaluation in the emergency room.  We discussed transport by personal vehicle versus ambulance and her daughter ultimately decided to go to the ER by personal vehicle.    Wallis Bamberg, [place] 01/31/21 1812

## 2021-01-31 NOTE — ED Notes (Signed)
Patient is being discharged from the Urgent Care and sent to the Emergency Department via private vehicle . Per M. Mani, [place], patient is in need of higher level of care due to chest pain / foot swelling. Patient is aware and verbalizes understanding of plan of care.  Vitals:   01/31/21 1725  BP: (!) 162/98  Pulse: 72  Resp: 16  Temp: 98 F (36.7 C)  SpO2: 94%

## 2021-01-31 NOTE — ED Triage Notes (Signed)
PT reports she noticed some foot swelling yesterday.   Today, both feet were so swollen she could hardly put on socks and shoes.  States during the day she normally urinates every hour. Today, she has only urinated 3-4 times.

## 2021-02-02 DIAGNOSIS — E119 Type 2 diabetes mellitus without complications: Secondary | ICD-10-CM | POA: Diagnosis not present

## 2021-02-02 DIAGNOSIS — I10 Essential (primary) hypertension: Secondary | ICD-10-CM | POA: Diagnosis not present

## 2021-02-02 DIAGNOSIS — Z6833 Body mass index (BMI) 33.0-33.9, adult: Secondary | ICD-10-CM | POA: Diagnosis not present

## 2021-02-02 DIAGNOSIS — I209 Angina pectoris, unspecified: Secondary | ICD-10-CM | POA: Diagnosis not present

## 2021-02-02 DIAGNOSIS — I5033 Acute on chronic diastolic (congestive) heart failure: Secondary | ICD-10-CM | POA: Diagnosis not present

## 2021-02-02 DIAGNOSIS — E6609 Other obesity due to excess calories: Secondary | ICD-10-CM | POA: Diagnosis not present

## 2021-02-07 ENCOUNTER — Other Ambulatory Visit: Payer: Self-pay | Admitting: Cardiology

## 2021-03-07 ENCOUNTER — Other Ambulatory Visit: Payer: Self-pay | Admitting: Student

## 2021-04-05 ENCOUNTER — Other Ambulatory Visit: Payer: Self-pay | Admitting: Family Medicine

## 2021-04-25 DIAGNOSIS — I10 Essential (primary) hypertension: Secondary | ICD-10-CM | POA: Diagnosis not present

## 2021-05-05 DIAGNOSIS — H401131 Primary open-angle glaucoma, bilateral, mild stage: Secondary | ICD-10-CM | POA: Diagnosis not present

## 2021-07-04 ENCOUNTER — Emergency Department (HOSPITAL_COMMUNITY): Payer: Medicare HMO

## 2021-07-04 ENCOUNTER — Encounter (HOSPITAL_COMMUNITY): Payer: Self-pay | Admitting: *Deleted

## 2021-07-04 ENCOUNTER — Emergency Department (HOSPITAL_COMMUNITY)
Admission: EM | Admit: 2021-07-04 | Discharge: 2021-07-04 | Disposition: A | Discharge status: Home / Self Care | Payer: Medicare HMO | Attending: Emergency Medicine | Admitting: Emergency Medicine

## 2021-07-04 DIAGNOSIS — E876 Hypokalemia: Secondary | ICD-10-CM | POA: Diagnosis not present

## 2021-07-04 DIAGNOSIS — Z7902 Long term (current) use of antithrombotics/antiplatelets: Secondary | ICD-10-CM | POA: Insufficient documentation

## 2021-07-04 DIAGNOSIS — Z7982 Long term (current) use of aspirin: Secondary | ICD-10-CM | POA: Insufficient documentation

## 2021-07-04 DIAGNOSIS — K1230 Oral mucositis (ulcerative), unspecified: Secondary | ICD-10-CM | POA: Diagnosis not present

## 2021-07-04 DIAGNOSIS — Z79899 Other long term (current) drug therapy: Secondary | ICD-10-CM | POA: Diagnosis not present

## 2021-07-04 DIAGNOSIS — R531 Weakness: Secondary | ICD-10-CM | POA: Diagnosis not present

## 2021-07-04 DIAGNOSIS — R29818 Other symptoms and signs involving the nervous system: Secondary | ICD-10-CM | POA: Diagnosis not present

## 2021-07-04 LAB — BASIC METABOLIC PANEL
Anion gap: 7 (ref 5–15)
BUN: 13 mg/dL (ref 8–23)
CO2: 27 mmol/L (ref 22–32)
Calcium: 9.2 mg/dL (ref 8.9–10.3)
Chloride: 104 mmol/L (ref 98–111)
Creatinine, Ser: 0.69 mg/dL (ref 0.44–1.00)
GFR, Estimated: >60 mL/min (ref >60)
Glucose, Bld: 105 mg/dL — ABNORMAL HIGH (ref 70–99)
Potassium: 3.6 mmol/L (ref 3.5–5.1)
Sodium: 138 mmol/L (ref 135–145)

## 2021-07-04 LAB — URINALYSIS, ROUTINE W REFLEX MICROSCOPIC
Bilirubin Urine: NEGATIVE
Glucose, UA: NEGATIVE mg/dL
Hgb urine dipstick: NEGATIVE
Ketones, ur: NEGATIVE mg/dL
Leukocytes,Ua: NEGATIVE
Nitrite: NEGATIVE
Protein, ur: NEGATIVE mg/dL
Specific Gravity, Urine: 1.004 — ABNORMAL LOW (ref 1.005–1.030)
pH: 7 (ref 5.0–8.0)

## 2021-07-04 LAB — CBG MONITORING, ED: Glucose-Capillary: 109 mg/dL — ABNORMAL HIGH (ref 70–99)

## 2021-07-04 LAB — CBC
HCT: 40.6 % (ref 36.0–46.0)
Hemoglobin: 14 g/dL (ref 12.0–15.0)
MCH: 32.4 pg (ref 26.0–34.0)
MCHC: 34.5 g/dL (ref 30.0–36.0)
MCV: 94 fL (ref 80.0–100.0)
Platelets: 190 10*3/uL (ref 150–400)
RBC: 4.32 MIL/uL (ref 3.87–5.11)
RDW: 11.9 % (ref 11.5–15.5)
WBC: 5 10*3/uL (ref 4.0–10.5)
nRBC: 0 % (ref 0.0–0.2)

## 2021-07-04 MED ORDER — POTASSIUM CHLORIDE ER 20 MEQ PO TBCR: 20.0000 meq | EXTENDED_RELEASE_TABLET | Freq: Every day | ORAL | 0 refills | Status: AC

## 2021-07-04 MED ORDER — POTASSIUM CHLORIDE CRYS ER 20 MEQ PO TBCR
40.0000 meq | EXTENDED_RELEASE_TABLET | Freq: Once | ORAL | Status: AC
  Administered 2021-07-04: 40 meq via ORAL
  Filled 2021-07-04: qty 2

## 2021-07-04 NOTE — Discharge Instructions (Signed)
Take your next dose of potassium tomorrow.  I have put in an order for a home health referral for physical therapy to help you with your strength and deconditioning.  Expect a call within the next 1 to 2 days to arrange a home visit for this.  In the interim follow-up with Dr. Gerarda Fraction as needed. ?

## 2021-07-04 NOTE — ED Triage Notes (Signed)
States her mouth gets dry after eating for the past 5 days ?

## 2021-07-04 NOTE — ED Notes (Signed)
Patient transported to CT 

## 2021-07-04 NOTE — ED Provider Notes (Signed)
?[facility] ?Provider Note ? ? ?CSN: [id number] ?Arrival date & time: 07/04/21  1146 ? ?  ? ?History ? ?No chief complaint on file. ? ? ?Sheryl Carter is a 81 y.o. female presenting with multiple complaints, she describes for the past several days that she has been having intermittent episodes of what feels like muscle spasms, primarily affecting her right upper extremity.  She was with family yesterday for the holiday and it was noted that she was having what appeared to be spasms in her right hand including her fingers.  Daughter at the bedside raises concerns about possible stroke as she has had "mini strokes" in the past.  Patient also reports she has had increased thirst and excessive dry mouth for the past 5 days despite attempting to increase her fluid intake to stay hydrated.  She reports generalized fatigue and weakness, stating that she has been getting weak with ambulation around her home.  She uses a cane and also has a walker at home, states that when she is walking from her living room to her kitchen she frequently becomes fatigued and has to sit down and rest.  She denies chest pain or shortness of breath, describes her legs "just feel weak".  She has had no fevers or chills, cough, abdominal pain, nausea or vomiting.  Denies dysuria or increased or decreased urinary frequency.  No recent falls, denies headache, vision changes, focal weakness. ? ?The history is provided by the patient.  ? ?  ? ?Home Medications ?Prior to Admission medications   ?Medication Sig Start Date End Date Taking? Authorizing Provider  ?potassium chloride 20 MEQ TBCR Take 20 mEq by mouth daily. 07/04/21  Yes Burgess Amor, PA-C  ?ALPRAZolam (XANAX) 0.25 MG tablet Take 0.25 mg by mouth 2 (two) times daily. *May take one additional dose at lunch as needed    [provider]  ?amLODipine (NORVASC) 5 MG tablet Take 1 tablet (5 mg total) by mouth daily. 12/31/19 01/05/21  Ellsworth Lennox, PA-C   ?aspirin EC 81 MG tablet Take 1 tablet (81 mg total) by mouth daily with breakfast. 03/18/19   Shon Hale, MD  ?fexofenadine (ALLEGRA) 180 MG tablet Take 180 mg by mouth daily.    [provider]  ?furosemide (LASIX) 20 MG tablet Take 1 tablet (20 mg total) by mouth daily. 01/31/21   Blue, Soijett A, PA-C  ?hydrocortisone (ANUSOL-HC) 2.5 % rectal cream Place 1 application rectally 2 (two) times daily. 11/08/20   Bing Neighbors, FNP  ?isosorbide mononitrate (IMDUR) 30 MG 24 hr tablet TAKE 1/2 TABLET BY MOUTH DAILY. 12/06/20   Strader, Lennart Pall, PA-C  ?nitroGLYCERIN (NITROSTAT) 0.4 MG SL tablet DISSOLVE (1) TABLET UNDER THE TONGUE EVERY 5 MINUTES AS NEEDED FOR CHEST PAIN. 02/07/21   Antoine Poche, MD  ?Omega-3 Fatty Acids (FISH OIL) 1000 MG CAPS Take 1,000 mg by mouth daily.    [provider]  ?rosuvastatin (CRESTOR) 5 MG tablet TAKE 1 TABLET BY MOUTH ONCE A DAY. 03/07/21   [place] Ouch, Lennart Pall, PA-C  ?timolol (BETIMOL) 0.5 % ophthalmic solution Place 1 drop into the right eye 2 (two) times daily.     [provider]  ?timolol (TIMOPTIC) 0.5 % ophthalmic solution Place 1 drop into the right eye 2 (two) times daily. ?Patient not taking: Reported on 01/05/2021 12/16/19   [provider]  ?triamcinolone cream (KENALOG) 0.1 % Apply 1 application topically 2 (two) times daily. 07/03/20   Avegno, Zachery Dakins,  FNP  ?vitamin B-12 (CYANOCOBALAMIN) 1000 MCG tablet Take 1,000 mcg by mouth at bedtime.     [provider]  ?   ? ?Allergies    ?Other, Prednisone, Statins, Erythromycin, and Tylenol [acetaminophen]   ? ?Review of Systems   ?Review of Systems  ?Constitutional:  Positive for fatigue. Negative for fever.  ?HENT:  Negative for congestion and sore throat.   ?     Dry mouth  ?Eyes: Negative.   ?Respiratory:  Negative for chest tightness and shortness of breath.   ?Cardiovascular:  Negative for chest pain.  ?Gastrointestinal:  Negative for abdominal pain, nausea and  vomiting.  ?Endocrine: Positive for polydipsia.  ?Genitourinary: Negative.   ?Musculoskeletal:  Negative for arthralgias, joint swelling and neck pain.  ?     Spasms right hand.  ?Skin: Negative.  Negative for rash and wound.  ?Neurological:  Positive for weakness. Negative for dizziness, light-headedness, numbness and headaches.  ?Psychiatric/Behavioral: Negative.    ?All other systems reviewed and are negative. ? ?Physical Exam ?Updated Vital Signs ?BP 124/77   Pulse 62   Temp 98 ?F (36.7 ?C) (Oral)   Resp 17   Ht  (1.397 m)   Wt 70.3 kg   SpO2 95%   BMI 36.03 kg/m?  ?Physical Exam ?Vitals and nursing note reviewed.  ?Constitutional:   ?   Appearance: She is well-developed.  ?HENT:  ?   Head: Normocephalic and atraumatic.  ?   Mouth/Throat:  ?   Mouth: Mucous membranes are moist.  ?   Comments: Small aphthous ulcer lower lip mucosa.  Edentulous.  ?Eyes:  ?   Conjunctiva/sclera: Conjunctivae normal.  ?Cardiovascular:  ?   Rate and Rhythm: Normal rate and regular rhythm.  ?   Heart sounds: Normal heart sounds.  ?Pulmonary:  ?   Effort: Pulmonary effort is normal.  ?   Breath sounds: Normal breath sounds. No wheezing.  ?Abdominal:  ?   General: Bowel sounds are normal.  ?   Palpations: Abdomen is soft.  ?   Tenderness: There is no abdominal tenderness. There is no guarding.  ?Musculoskeletal:     ?   General: Normal range of motion.  ?   Cervical back: Normal range of motion.  ?Skin: ?   General: Skin is warm and dry.  ?Neurological:  ?   General: No focal deficit present.  ?   Mental Status: She is alert and oriented to person, place, and time.  ?   Cranial Nerves: No cranial nerve deficit.  ?   Sensory: Sensation is intact. No sensory deficit.  ?   Motor: Motor function is intact.  ?   Coordination: Coordination normal. Finger-Nose-Finger Test normal. Rapid alternating movements normal.  ?   Gait: Gait normal.  ?   Comments: Was able to ambulate to the bathroom with minimal assistance. Equal grip  strength.  Negative pronator drift.  ? ? ?ED Results / Procedures / Treatments   ?Labs ?(all labs ordered are listed, but only abnormal results are displayed) ?Labs Reviewed  ?BASIC METABOLIC PANEL - Abnormal; Notable for the following components:  ?    Result Value  ? Glucose, Bld 105 (*)   ? All other components within normal limits  ?URINALYSIS, ROUTINE W REFLEX MICROSCOPIC - Abnormal; Notable for the following components:  ? Color, Urine STRAW (*)   ? Specific Gravity, Urine 1.004 (*)   ? All other components within normal limits  ?CBG MONITORING, ED - Abnormal; Notable for  the following components:  ? Glucose-Capillary 109 (*)   ? All other components within normal limits  ?CBC  ? ? ?EKG ?None ?ED ECG REPORT ? ? Date: 07/05/2021 ? Rate: 67 ? Rhythm: normal sinus rhythm ? QRS Axis: normal ? Intervals: normal ? ST/T Wave abnormalities: normal ? Conduction Disutrbances:none ? Narrative Interpretation:  ? Old EKG Reviewed: unchanged ? ?I have personally reviewed the EKG tracing and agree with the computerized printout as noted. ? ?Radiology ?DG Chest 1 View ? ?Result Date: 07/04/2021 ?CLINICAL DATA:  Weakness EXAM: CHEST  1 VIEW COMPARISON:  Chest x-ray 01/31/2021 FINDINGS: Cardiomediastinal silhouette is stable. Cardiac surgical changes and median sternotomy wires. Pulmonary vasculature is normal. No focal consolidation identified. No pleural effusion or pneumothorax identified. Stable elevation of the left hemidiaphragm. IMPRESSION: No acute intrathoracic process identified. Electronically Signed   By: Jannifer Hick M.D.   On: 07/04/2021 15:06  ? ?CT Head Wo Contrast ? ?Result Date: 07/04/2021 ?CLINICAL DATA:  Acute neuro deficit EXAM: CT HEAD WITHOUT CONTRAST TECHNIQUE: Contiguous axial images were obtained from the base of the skull through the vertex without intravenous contrast. RADIATION DOSE REDUCTION: This exam was performed according to the departmental dose-optimization program which includes automated  exposure control, adjustment of the mA and/or kV according to patient size and/or use of iterative reconstruction technique. COMPARISON:  CT head 03/17/2019 FINDINGS: Brain: No acute intracranial hemorrhage, mass effect

## 2021-07-04 NOTE — ED Notes (Signed)
Pt reports she has had dry mouth and feels like she has blisters in her mouth. Also having weakness since last week where it feels like her legs will go out. Endorses headache yesterday.  ?

## 2021-07-15 DIAGNOSIS — E6609 Other obesity due to excess calories: Secondary | ICD-10-CM | POA: Diagnosis not present

## 2021-07-15 DIAGNOSIS — E876 Hypokalemia: Secondary | ICD-10-CM | POA: Diagnosis not present

## 2021-07-15 DIAGNOSIS — I10 Essential (primary) hypertension: Secondary | ICD-10-CM | POA: Diagnosis not present

## 2021-07-15 DIAGNOSIS — E119 Type 2 diabetes mellitus without complications: Secondary | ICD-10-CM | POA: Diagnosis not present

## 2021-07-15 DIAGNOSIS — R5381 Other malaise: Secondary | ICD-10-CM | POA: Diagnosis not present

## 2021-07-15 DIAGNOSIS — Z6835 Body mass index (BMI) 35.0-35.9, adult: Secondary | ICD-10-CM | POA: Diagnosis not present

## 2021-08-02 DIAGNOSIS — E876 Hypokalemia: Secondary | ICD-10-CM | POA: Diagnosis not present

## 2021-08-02 DIAGNOSIS — I5033 Acute on chronic diastolic (congestive) heart failure: Secondary | ICD-10-CM | POA: Diagnosis not present

## 2021-08-02 DIAGNOSIS — I110 Hypertensive heart disease with heart failure: Secondary | ICD-10-CM | POA: Diagnosis not present

## 2021-08-02 DIAGNOSIS — E119 Type 2 diabetes mellitus without complications: Secondary | ICD-10-CM | POA: Diagnosis not present

## 2021-10-03 ENCOUNTER — Observation Stay (HOSPITAL_COMMUNITY)
Admission: EM | Admit: 2021-10-03 | Discharge: 2021-10-05 | Disposition: A | Discharge status: Home / Self Care | Payer: Medicare HMO | Attending: Internal Medicine | Admitting: Internal Medicine

## 2021-10-03 ENCOUNTER — Other Ambulatory Visit: Payer: Self-pay

## 2021-10-03 ENCOUNTER — Emergency Department (HOSPITAL_COMMUNITY): Payer: Medicare HMO

## 2021-10-03 ENCOUNTER — Encounter (HOSPITAL_COMMUNITY): Payer: Self-pay | Admitting: *Deleted

## 2021-10-03 DIAGNOSIS — I10 Essential (primary) hypertension: Secondary | ICD-10-CM | POA: Diagnosis not present

## 2021-10-03 DIAGNOSIS — Z8673 Personal history of transient ischemic attack (TIA), and cerebral infarction without residual deficits: Secondary | ICD-10-CM | POA: Diagnosis not present

## 2021-10-03 DIAGNOSIS — R079 Chest pain, unspecified: Secondary | ICD-10-CM | POA: Diagnosis not present

## 2021-10-03 DIAGNOSIS — Z951 Presence of aortocoronary bypass graft: Secondary | ICD-10-CM | POA: Diagnosis not present

## 2021-10-03 DIAGNOSIS — R0789 Other chest pain: Secondary | ICD-10-CM | POA: Diagnosis not present

## 2021-10-03 DIAGNOSIS — Z79899 Other long term (current) drug therapy: Secondary | ICD-10-CM | POA: Insufficient documentation

## 2021-10-03 DIAGNOSIS — I2510 Atherosclerotic heart disease of native coronary artery without angina pectoris: Secondary | ICD-10-CM | POA: Diagnosis not present

## 2021-10-03 DIAGNOSIS — R519 Headache, unspecified: Secondary | ICD-10-CM | POA: Diagnosis not present

## 2021-10-03 DIAGNOSIS — Z86718 Personal history of other venous thrombosis and embolism: Secondary | ICD-10-CM | POA: Insufficient documentation

## 2021-10-03 DIAGNOSIS — R531 Weakness: Secondary | ICD-10-CM | POA: Diagnosis not present

## 2021-10-03 DIAGNOSIS — Z7982 Long term (current) use of aspirin: Secondary | ICD-10-CM | POA: Insufficient documentation

## 2021-10-03 LAB — URINALYSIS, ROUTINE W REFLEX MICROSCOPIC
Bilirubin Urine: NEGATIVE
Glucose, UA: NEGATIVE mg/dL
Hgb urine dipstick: NEGATIVE
Ketones, ur: NEGATIVE mg/dL
Leukocytes,Ua: NEGATIVE
Nitrite: NEGATIVE
Protein, ur: NEGATIVE mg/dL
Specific Gravity, Urine: 1.003 — ABNORMAL LOW (ref 1.005–1.030)
pH: 7 (ref 5.0–8.0)

## 2021-10-03 LAB — COMPREHENSIVE METABOLIC PANEL
ALT: 20 U/L (ref 0–44)
AST: 31 U/L (ref 15–41)
Albumin: 3.7 g/dL (ref 3.5–5.0)
Alkaline Phosphatase: 65 U/L (ref 38–126)
Anion gap: 7 (ref 5–15)
BUN: 10 mg/dL (ref 8–23)
CO2: 29 mmol/L (ref 22–32)
Calcium: 9.4 mg/dL (ref 8.9–10.3)
Chloride: 102 mmol/L (ref 98–111)
Creatinine, Ser: 0.66 mg/dL (ref 0.44–1.00)
GFR, Estimated: >60 mL/min (ref >60)
Glucose, Bld: 99 mg/dL (ref 70–99)
Potassium: 3.8 mmol/L (ref 3.5–5.1)
Sodium: 138 mmol/L (ref 135–145)
Total Bilirubin: 1.3 mg/dL — ABNORMAL HIGH (ref 0.3–1.2)
Total Protein: 6.7 g/dL (ref 6.5–8.1)

## 2021-10-03 LAB — CBC
HCT: 41.8 % (ref 36.0–46.0)
Hemoglobin: 14.3 g/dL (ref 12.0–15.0)
MCH: 32.4 pg (ref 26.0–34.0)
MCHC: 34.2 g/dL (ref 30.0–36.0)
MCV: 94.6 fL (ref 80.0–100.0)
Platelets: 206 10*3/uL (ref 150–400)
RBC: 4.42 MIL/uL (ref 3.87–5.11)
RDW: 12.2 % (ref 11.5–15.5)
WBC: 6 10*3/uL (ref 4.0–10.5)
nRBC: 0 % (ref 0.0–0.2)

## 2021-10-03 LAB — TROPONIN I (HIGH SENSITIVITY)
Troponin I (High Sensitivity): 5 ng/L (ref <18)
Troponin I (High Sensitivity): 4 ng/L (ref <18)

## 2021-10-03 MED ORDER — ISOSORBIDE MONONITRATE ER 60 MG PO TB24
30.0000 mg | ORAL_TABLET | Freq: Every day | ORAL | Status: DC
  Filled 2021-10-03: qty 1

## 2021-10-03 MED ORDER — ENOXAPARIN SODIUM 40 MG/0.4ML IJ SOSY
40.0000 mg | PREFILLED_SYRINGE | INTRAMUSCULAR | Status: DC
  Administered 2021-10-04 – 2021-10-05 (×2): 40 mg via SUBCUTANEOUS
  Filled 2021-10-03 (×2): qty 0.4

## 2021-10-03 MED ORDER — TIMOLOL MALEATE 0.5 % OP SOLN
1.0000 [drp] | Freq: Two times a day (BID) | OPHTHALMIC | Status: DC
  Administered 2021-10-03 – 2021-10-04 (×3): 1 [drp] via OPHTHALMIC
  Filled 2021-10-03 (×2): qty 5

## 2021-10-03 MED ORDER — NITROGLYCERIN 0.4 MG SL SUBL: 0.4000 mg | SUBLINGUAL_TABLET | SUBLINGUAL | Status: DC | PRN

## 2021-10-03 MED ORDER — AMLODIPINE BESYLATE 5 MG PO TABS
5.0000 mg | ORAL_TABLET | Freq: Every day | ORAL | Status: DC
  Administered 2021-10-04 – 2021-10-05 (×2): 5 mg via ORAL
  Filled 2021-10-03 (×2): qty 1

## 2021-10-03 MED ORDER — HYDROCHLOROTHIAZIDE 25 MG PO TABS
25.0000 mg | ORAL_TABLET | Freq: Every day | ORAL | Status: DC
  Administered 2021-10-04: 25 mg via ORAL
  Filled 2021-10-03 (×2): qty 1

## 2021-10-03 MED ORDER — ONDANSETRON HCL 4 MG/2ML IJ SOLN: 4.0000 mg | Freq: Four times a day (QID) | INTRAMUSCULAR | Status: DC | PRN

## 2021-10-03 MED ORDER — ONDANSETRON HCL 4 MG PO TABS: 4.0000 mg | ORAL_TABLET | Freq: Four times a day (QID) | ORAL | Status: DC | PRN

## 2021-10-03 MED ORDER — HYDROCODONE-ACETAMINOPHEN 5-325 MG PO TABS: 1.0000 | ORAL_TABLET | Freq: Once | ORAL | Status: DC

## 2021-10-03 MED ORDER — ALPRAZOLAM 0.25 MG PO TABS
0.2500 mg | ORAL_TABLET | Freq: Every day | ORAL | Status: DC | PRN
  Administered 2021-10-04: 0.25 mg via ORAL
  Filled 2021-10-03: qty 1

## 2021-10-03 MED ORDER — ACETAMINOPHEN 650 MG RE SUPP: 650.0000 mg | Freq: Four times a day (QID) | RECTAL | Status: DC | PRN

## 2021-10-03 MED ORDER — TIMOLOL HEMIHYDRATE 0.5 % OP SOLN: 1.0000 [drp] | Freq: Two times a day (BID) | OPHTHALMIC | Status: DC

## 2021-10-03 MED ORDER — ALPRAZOLAM 0.25 MG PO TABS
0.2500 mg | ORAL_TABLET | Freq: Two times a day (BID) | ORAL | Status: DC
  Administered 2021-10-03: 0.25 mg via ORAL
  Filled 2021-10-03: qty 1

## 2021-10-03 MED ORDER — POLYETHYLENE GLYCOL 3350 17 G PO PACK: 17.0000 g | PACK | Freq: Every day | ORAL | Status: DC | PRN

## 2021-10-03 MED ORDER — FUROSEMIDE 20 MG PO TABS
20.0000 mg | ORAL_TABLET | Freq: Every day | ORAL | Status: DC
  Administered 2021-10-04 – 2021-10-05 (×2): 20 mg via ORAL
  Filled 2021-10-03 (×2): qty 1

## 2021-10-03 MED ORDER — ASPIRIN 81 MG PO TBEC
81.0000 mg | DELAYED_RELEASE_TABLET | Freq: Every day | ORAL | Status: DC
  Administered 2021-10-04 – 2021-10-05 (×2): 81 mg via ORAL
  Filled 2021-10-03 (×2): qty 1

## 2021-10-03 MED ORDER — ACETAMINOPHEN 325 MG PO TABS: 650.0000 mg | ORAL_TABLET | Freq: Four times a day (QID) | ORAL | Status: DC | PRN

## 2021-10-03 MED ORDER — ROSUVASTATIN CALCIUM 10 MG PO TABS
5.0000 mg | ORAL_TABLET | Freq: Every day | ORAL | Status: DC
  Administered 2021-10-04: 5 mg via ORAL
  Filled 2021-10-03 (×2): qty 1

## 2021-10-03 NOTE — Assessment & Plan Note (Addendum)
With risk factors for CAD - history of CABG x 4 .  Troponins, EKG unremarkable.  Followed with cardiology until last visit 2021. -Cardiology to see in the morning -N.p.o. midnight - EKG a.m -Resume Imdur -Nitro as needed -Resume Crestor 5mg  daily, intolerant of high intensity statin per cards notes -Resume aspirin 81 mg daily

## 2021-10-03 NOTE — ED Triage Notes (Signed)
Pt has c/o chest pain and headache to top of head and back of neck; pt states she has been very thirsty

## 2021-10-03 NOTE — Assessment & Plan Note (Signed)
Stable. -Resume Norvasc, HCTZ, Lasix, Imdur

## 2021-10-03 NOTE — ED Notes (Signed)
After walking to the bathroom and getting back in bed, pt c/o mid chest pain and SOB. Burgess Amor, PA notified. New EKG performed and given to  MD.

## 2021-10-03 NOTE — ED Provider Notes (Signed)
[facility] Provider Note   CSN: [id number] Arrival date & time: 10/03/21  1209     History  Chief Complaint  Patient presents with   Chest Pain    Sheryl Carter is a 81 y.o. female with history including CVA, hypercholesterolemia, hypertension, CAD, status post CABG in 2018, also has been monitored for prediabetes presenting for evaluation of chest pain and headache.  She describes waking around 2 AM this morning to go to the bathroom and on the way back to her bed she had sharp 8/10  midsternal chest pain which was constant lasting more than 1 minute but less than 2 minutes, resolved and she proceeded back to bed.  She does not recall having any other symptoms other than the chest pain.  After waking today she noted a intermittent headache across the top of her head which is currently gone and has had at least 3 additional episodes of sharp chest pain, also currently resolved, 1 of these episodes also cause radiation to her left jaw. She reports feeling fatigued, she denies nausea or vomiting, diaphoresis, palpitations, says she may have had some mild shortness of breath with the chest pain episodes.  The episodes that occurred today were while she was at rest. She has no treatment prior to arrival.  She states she felt well yesterday although does endorse having increased acid reflux.  She does have a prescription for nitroglycerin but never thinks of taking this medication.  Her symptoms resolved spontaneously.  She has complaints of chronic dry mouth which has been a ongoing concern for her.  She denies polydipsia, appetite has been good, no unexplained weight loss.  The history is provided by the patient.       Home Medications Prior to Admission medications   Medication Sig Start Date End Date Taking? Authorizing Provider  ALPRAZolam (XANAX) 0.25 MG tablet Take 0.25 mg by mouth 2 (two) times daily. *May take one additional dose at lunch as needed   Yes  [provider]  amLODipine (NORVASC) 5 MG tablet Take 1 tablet (5 mg total) by mouth daily. 12/31/19 10/03/21 Yes Strader, Fransisco Hertz, PA-C  antiseptic oral rinse (BIOTENE) LIQD 15 mLs by Mouth Rinse route as needed for dry mouth.   Yes [provider]  aspirin EC 81 MG tablet Take 1 tablet (81 mg total) by mouth daily with breakfast. 03/18/19  Yes Emokpae, Courage, MD  fexofenadine (ALLEGRA) 180 MG tablet Take 180 mg by mouth daily.   Yes [provider]  furosemide (LASIX) 20 MG tablet Take 1 tablet (20 mg total) by mouth daily. 01/31/21  Yes Blue, Soijett A, PA-C  hydrochlorothiazide (HYDRODIURIL) 25 MG tablet Take 25 mg by mouth daily. 09/19/21  Yes [provider]  isosorbide mononitrate (IMDUR) 30 MG 24 hr tablet TAKE 1/2 TABLET BY MOUTH DAILY. Patient taking differently: Take 30 mg by mouth daily. 12/06/20  Yes Strader, [place] M, PA-C  nitroGLYCERIN (NITROSTAT) 0.4 MG SL tablet DISSOLVE (1) TABLET UNDER THE TONGUE EVERY 5 MINUTES AS NEEDED FOR CHEST PAIN. Patient taking differently: Place 0.4 mg under the tongue every 5 (five) minutes as needed. 02/07/21  Yes BranchAlphonse Guild, MD  Omega-3 Fatty Acids (FISH OIL) 1000 MG CAPS Take 1,000 mg by mouth daily.   Yes [provider]  rosuvastatin (CRESTOR) 5 MG tablet TAKE 1 TABLET BY MOUTH ONCE A DAY. Patient taking differently: Take 5 mg by mouth daily. 03/07/21  Yes Strader, Fransisco Hertz, PA-C  timolol (BETIMOL) 0.5 % ophthalmic solution Place 1 drop into the right eye 2 (two) times daily.    Yes [provider]  vitamin B-12 (CYANOCOBALAMIN) 1000 MCG tablet Take 1,000 mcg by mouth daily.   Yes [provider]  potassium chloride 20 MEQ TBCR Take 20 mEq by mouth daily. Patient not taking: Reported on 10/03/2021 07/04/21   Evalee Jefferson, PA-C      Allergies    Other, Prednisone, Statins, Erythromycin, and Tylenol [acetaminophen]    Review of Systems   Review of Systems   Constitutional:  Negative for fever.  HENT:  Negative for congestion and sore throat.   Eyes: Negative.   Respiratory:  Negative for chest tightness and shortness of breath.   Cardiovascular:  Positive for chest pain.  Gastrointestinal:  Negative for abdominal pain, nausea and vomiting.  Genitourinary: Negative.   Musculoskeletal:  Negative for arthralgias, joint swelling and neck pain.  Skin: Negative.  Negative for rash and wound.  Neurological:  Positive for headaches. Negative for dizziness, weakness, light-headedness and numbness.  Psychiatric/Behavioral: Negative.      Physical Exam Updated Vital Signs BP (!) 154/68   Pulse 61   Temp 97.6 F (36.4 C) (Oral)   Resp 19   Ht '4\' 7"'  (1.397 m)   Wt 72.6 kg   SpO2 97%   BMI 37.19 kg/m  Physical Exam Vitals and nursing note reviewed.  Constitutional:      Appearance: She is well-developed.  HENT:     Head: Normocephalic and atraumatic.  Eyes:     Conjunctiva/sclera: Conjunctivae normal.  Cardiovascular:     Rate and Rhythm: Normal rate and regular rhythm.     Heart sounds: Normal heart sounds.  Pulmonary:     Effort: Pulmonary effort is normal.     Breath sounds: Normal breath sounds. No decreased breath sounds, wheezing or rhonchi.  Abdominal:     General: Bowel sounds are normal.     Palpations: Abdomen is soft.     Tenderness: There is no abdominal tenderness.  Musculoskeletal:        General: Normal range of motion.     Cervical back: Normal range of motion.     Right lower leg: Edema present.     Left lower leg: Edema present.     Comments: 1+ edema bilateral ankles  Skin:    General: Skin is warm and dry.     Capillary Refill: Capillary refill takes less than 2 seconds.  Neurological:     Mental Status: She is alert.     ED Results / Procedures / Treatments   Labs (all labs ordered are listed, but only abnormal results are displayed) Labs Reviewed  COMPREHENSIVE METABOLIC PANEL - Abnormal; Notable  for the following components:      Result Value   Total Bilirubin 1.3 (*)    All other components within normal limits  URINALYSIS, ROUTINE W REFLEX MICROSCOPIC - Abnormal; Notable for the following components:   Color, Urine COLORLESS (*)    Specific Gravity, Urine 1.003 (*)    All other components within normal limits  CBC  TROPONIN I (HIGH SENSITIVITY)  TROPONIN I (HIGH SENSITIVITY)    EKG EKG Interpretation  Date/Time:  Monday October 03 2021 12:27:56 EDT Ventricular Rate:  61 PR Interval:  174 QRS Duration: 88 QT Interval:  448 QTC Calculation: 450 R Axis:   26 Text Interpretation: Normal sinus rhythm Nonspecific ST and T wave abnormality Abnormal ECG When compared with ECG  of 04-Jul-2021 12:37, PREVIOUS ECG IS PRESENT No significant change since last tracing Confirmed by Isla Pence [phone]) on 10/03/2021 1:20:20 PM  Radiology DG Chest 2 View  Result Date: 10/03/2021 CLINICAL DATA:  Chest pain EXAM: CHEST - 2 VIEW COMPARISON:  07/04/2021 FINDINGS: Chronic elevation of the left diaphragm. Mild bilateral interstitial thickening. No focal consolidation. No pleural effusion or pneumothorax. Heart and mediastinal contours are unremarkable. Prior CABG. No acute osseous abnormality. IMPRESSION: No active cardiopulmonary disease. Electronically Signed   By: Kathreen Devoid M.D.   On: 10/03/2021 12:55    Procedures Procedures    Medications Ordered in ED Medications - No data to display  ED Course/ Medical Decision Making/ A&P                           Medical Decision Making Patient who is a high risk cardiac patient with multiple risk factors, history of known CAD, status post CABG in 2018, episodic transient chest pain last occurring prior to arrival today.  Her initial troponin is negative, pending delta troponin.  EKG is stable with no changes.  5:38 PM Pt was ambulating from the bathroom when she had another episode of sharp chest pain with sob, chest pain resolved after  several minutes resting, still has perceived sob with "heart beat in my throat" but no tachycardia, no tachypnea or hypoxia.    Will plan obs admission to to concern for unstable angina.   Amount and/or Complexity of Data Reviewed External Data Reviewed: notes.    Details: Details of patient's hospitalization for her CABG and her most recent cardiology note has been reviewed. Labs: ordered.    Details: Labs reviewed, c-Met is normal except for a small bump in her total bilirubin of 1.3, incidental finding, there is no indication that today's presentation is abdominal source, her LFTs are normal.  CBC normal, her electrolytes are normal, delta troponins are negative. Radiology: ordered and independent interpretation performed.    Details: no active disease Discussion of management or test interpretation with external provider(s): Call placed to cardiology, Dr. Harrington Challenger to assist with management of this patient.  She is high risk, therefore an overnight observation may be warranted, however she has been symptom-free without any intervention since prior to arrival.  She may be able to be discharged home with close office follow-up.  Discussed with Dr. Harrington Challenger who advises dc home with close f/u if second troponin is negative.  She will get her close f/u care arranged.  See documentation above.  Patient had another episode of chest pain while here, another conversation with Dr. Harrington Challenger, we will plan to overnight ops this patient.  Discussed with Dr. Denton Brick with the hospitalist service who agrees to admit the patient.  Risk Decision regarding hospitalization.           Final Clinical Impression(s) / ED Diagnoses Final diagnoses:  Chest pain, unspecified type    Rx / DC Orders ED Discharge Orders     None         Landis Martins 10/03/21 1820    Isla Pence, MD 10/04/21 [phone]

## 2021-10-03 NOTE — H&P (Signed)
History and Physical    Sheryl Carter LGX:[id number] DOB: June 15, 1940 DOA: 10/03/2021  PCP: Elfredia Nevins, MD   Patient coming from: Home  I have personally briefly reviewed patient's old medical records in [facility]  Chief Complaint: Chest pain  HPI: Sheryl Carter is a 81 y.o. female with medical history significant for CABG in 2018, hypertension, DVT, stroke. Patient presented to the ED with complaints of feeling off, chest pain and headache.  Patient reports onset of chest pains this morning.  Chest pain and midsternal, nonradiating to the left side of her throat.  She reports difficulty breathing with exertion that resolves with rest.  She also reports pain headache which is unusual for her.  Chest pain has since resolved.  No associated dizziness.  Has chronic unchanged bilateral lower extremity swelling.  ED Course: Temperature 98.  Heart rate 56 -68.  Respiratory rate 17-22.  Blood pressure systolic 1 teens to 160.  O2 sats greater than 96% on room air. Troponin 5 > 4.  EKG unchanged. EDP spoke to cardiologist Dr. Tenny Craw, initial plan was for patient to be discharged home and follow-up closely with cardiology, but on ambulating to the bathroom in the ED, patient had recurrence of her chest pain and hence request for admission.  Cardiology was updated about change in plan, recommended n.p.o. status from midnight  Review of Systems: As per HPI all other systems reviewed and negative.  Past Medical History:  Diagnosis Date   Anxiety    Blindness of left eye    CAD (coronary artery disease)    a. s/p CABG in 05/2016 with LIMA-LAD, SVG-D2, SVG-OM, SVG-RCA   Complication of anesthesia    has gas trapped in her body after   DVT (deep venous thrombosis) (HCC)    Ear problems    Family history of adverse reaction to anesthesia    daughter has gas trapped in her body postop as well   Glaucoma    Hypercholesterolemia    Hypertension    Ischemic cardiomyopathy    a.  05/2016 Echo: EF 50-55%, inf HK.   Pre-diabetes    pt. told by [facility] staff that she is prediabetic, HgbA1c was 5.4   Stroke (HCC)    having TIA's    Past Surgical History:  Procedure Laterality Date   BREAST SURGERY Left    cyst- L breast   CHOLECYSTECTOMY     CORONARY ARTERY BYPASS GRAFT N/A 06/09/2016   Procedure: CORONARY ARTERY BYPASS GRAFTING (CABG) times 4 using the left internal mammary artery and bilateral thigh greater saphenous veins harvested endoscopically. LIMA to LAD, SVG to DIAGONAL 2, SVG to OM, SVG to RCA;  Surgeon: Delight Ovens, MD;  Location: [facility] OR;  Service: Open Heart Surgery;  Laterality: N/A;   ENDARTERECTOMY Left 05/29/2016   Procedure: LEFT CAROTID ENDARTERECTOMY WITH PATCH ANGIOPLASTY;  Surgeon: Larina Earthly, MD;  Location: [facility] OR;  Service: Vascular;  Laterality: Left;   EYE SURGERY Right    cataract   LEFT HEART CATH AND CORONARY ANGIOGRAPHY N/A 06/07/2016   Procedure: Left Heart Cath and Coronary Angiography;  Surgeon: Iran Ouch, MD;  Location: [facility] INVASIVE CV LAB;  Service: Cardiovascular;  Laterality: N/A;   TEE WITHOUT CARDIOVERSION N/A 06/09/2016   Procedure: TRANSESOPHAGEAL ECHOCARDIOGRAM (TEE);  Surgeon: Delight Ovens, MD;  Location: [facility] OR;  Service: Open Heart Surgery;  Laterality: N/A;     reports that she has never smoked. She has never used smokeless tobacco. She  reports that she does not drink alcohol and does not use drugs.  Allergies  Allergen Reactions   Other Hives    Mycins-   Pt has Glaucoma in Right eye, Blind in Left eye .... PLEASE DO NOT GIVE ANYTHING TO PATIENT THAT MIGHT DESTROY VISION    Prednisone Other (See Comments)    Altered mental status   Statins Other (See Comments)    MYALGIAS Weakness, muscle aches, and pain   Erythromycin     UNSPECIFIED REACTION    Tylenol [Acetaminophen] Other (See Comments)    Hallucinations    Family History  Problem Relation Age of Onset   Heart disease Sister    Heart disease Son      Prior to Admission medications   Medication Sig Start Date End Date Taking? Authorizing Provider  ALPRAZolam (XANAX) 0.25 MG tablet Take 0.25 mg by mouth 2 (two) times daily. *May take one additional dose at lunch as needed   Yes [provider]  amLODipine (NORVASC) 5 MG tablet Take 1 tablet (5 mg total) by mouth daily. 12/31/19 10/03/21 Yes Strader, Lennart Pall, PA-C  antiseptic oral rinse (BIOTENE) LIQD 15 mLs by Mouth Rinse route as needed for dry mouth.   Yes [provider]  aspirin EC 81 MG tablet Take 1 tablet (81 mg total) by mouth daily with breakfast. 03/18/19  Yes Porschia Willbanks, Courage, MD  fexofenadine (ALLEGRA) 180 MG tablet Take 180 mg by mouth daily.   Yes [provider]  furosemide (LASIX) 20 MG tablet Take 1 tablet (20 mg total) by mouth daily. 01/31/21  Yes Blue, Soijett A, PA-C  hydrochlorothiazide (HYDRODIURIL) 25 MG tablet Take 25 mg by mouth daily. 09/19/21  Yes [provider]  isosorbide mononitrate (IMDUR) 30 MG 24 hr tablet TAKE 1/2 TABLET BY MOUTH DAILY. Patient taking differently: Take 30 mg by mouth daily. 12/06/20  Yes Strader, [place] M, PA-C  nitroGLYCERIN (NITROSTAT) 0.4 MG SL tablet DISSOLVE (1) TABLET UNDER THE TONGUE EVERY 5 MINUTES AS NEEDED FOR CHEST PAIN. Patient taking differently: Place 0.4 mg under the tongue every 5 (five) minutes as needed. 02/07/21  Yes BranchDorothe Pea, MD  Omega-3 Fatty Acids (FISH OIL) 1000 MG CAPS Take 1,000 mg by mouth daily.   Yes [provider]  rosuvastatin (CRESTOR) 5 MG tablet TAKE 1 TABLET BY MOUTH ONCE A DAY. Patient taking differently: Take 5 mg by mouth daily. 03/07/21  Yes Strader, [place] M, PA-C  timolol (BETIMOL) 0.5 % ophthalmic solution Place 1 drop into the right eye 2 (two) times daily.    Yes [provider]  vitamin B-12 (CYANOCOBALAMIN) 1000 MCG tablet Take 1,000 mcg by mouth daily.   Yes [provider]  potassium chloride 20 MEQ TBCR Take 20 mEq  by mouth daily. Patient not taking: Reported on 10/03/2021 07/04/21   Burgess Amor, PA-C    Physical Exam: Vitals:   10/03/21 1800 10/03/21 1830 10/03/21 1921 10/03/21 2142  BP: (!) 154/68 (!) 144/79 (!) 152/97 (!) 148/96  Pulse: 61 63 67 62  Resp: 19 19  18   Temp:   98 F (36.7 C) 97.7 F (36.5 C)  TempSrc:   Oral Oral  SpO2: 97% 97% 100% 98%  Weight:   72 kg   Height:   4\' 7"  (1.397 m)     Constitutional: NAD, calm, comfortable Vitals:   10/03/21 1800 10/03/21 1830 10/03/21 1921 10/03/21 2142  BP: (!) 154/68 (!) 144/79 (!) 152/97 (!) 148/96  Pulse: 61 63  67 62  Resp: 19 19  18   Temp:   98 F (36.7 C) 97.7 F (36.5 C)  TempSrc:   Oral Oral  SpO2: 97% 97% 100% 98%  Weight:   72 kg   Height:   4\' 7"  (1.397 m)    Eyes: PERRL, lids and conjunctivae normal ENMT: Mucous membranes are moist.   Neck: normal, supple, no masses, no thyromegaly Respiratory: clear to auscultation bilaterally, no wheezing, no crackles.  Cardiovascular: Regular rate and rhythm, no murmurs / rubs / gallops.  Trace bilateral pitting lower extremity edema.  Chronic.  Lower extremities warm.    Abdomen: no tenderness, no masses palpated. No hepatosplenomegaly. Bowel sounds positive.  Musculoskeletal: no clubbing / cyanosis. No joint deformity upper and lower extremities.  Skin: no rashes, lesions, ulcers. No induration Neurologic:  No apparent cranial nerve abnormality, moving extremities spontaneously Psychiatric: Normal judgment and insight. Alert and oriented x 3. Normal mood.   Labs on Admission: I have personally reviewed following labs and imaging studies  CBC: Recent Labs  Lab 10/03/21 1321  WBC 6.0  HGB 14.3  HCT 41.8  MCV 94.6  PLT 206   Basic Metabolic Panel: Recent Labs  Lab 10/03/21 1321  NA 138  K 3.8  CL 102  CO2 29  GLUCOSE 99  BUN 10  CREATININE 0.66  CALCIUM 9.4   GFR: Estimated Creatinine Clearance: 42.1 mL/min (by C-G formula based on SCr of 0.66 mg/dL). Liver  Function Tests: Recent Labs  Lab 10/03/21 1321  AST 31  ALT 20  ALKPHOS 65  BILITOT 1.3*  PROT 6.7  ALBUMIN 3.7   Urine analysis:    Component Value Date/Time   COLORURINE COLORLESS (A) 10/03/2021 1321   APPEARANCEUR CLEAR 10/03/2021 1321   LABSPEC 1.003 (L) 10/03/2021 1321   PHURINE 7.0 10/03/2021 1321   GLUCOSEU NEGATIVE 10/03/2021 1321   HGBUR NEGATIVE 10/03/2021 1321   BILIRUBINUR NEGATIVE 10/03/2021 1321   KETONESUR NEGATIVE 10/03/2021 1321   PROTEINUR NEGATIVE 10/03/2021 1321   NITRITE NEGATIVE 10/03/2021 1321   LEUKOCYTESUR NEGATIVE 10/03/2021 1321    Radiological Exams on Admission: DG Chest 2 View  Result Date: 10/03/2021 CLINICAL DATA:  Chest pain EXAM: CHEST - 2 VIEW COMPARISON:  07/04/2021 FINDINGS: Chronic elevation of the left diaphragm. Mild bilateral interstitial thickening. No focal consolidation. No pleural effusion or pneumothorax. Heart and mediastinal contours are unremarkable. Prior CABG. No acute osseous abnormality. IMPRESSION: No active cardiopulmonary disease. Electronically Signed   By: 12/04/2021 M.D.   On: 10/03/2021 12:55    EKG: Independently reviewed.  Sinus rhythm rate 62, QTc 484.  No significant ST or T wave changes from prior.  Assessment/Plan Principal Problem:   Chest pain Active Problems:   Hypertension, essential   S/P CABG x 4  Assessment and Plan: * Chest pain With risk factors for CAD - history of CABG x 4 .  Troponins, EKG unremarkable.  Followed with cardiology until last visit 2021. -Cardiology to see in the morning -N.p.o. midnight - EKG a.m -Resume Imdur -Nitro as needed -Resume Crestor 5mg  daily, intolerant of high intensity statin per cards notes -Resume aspirin 81 mg daily   Hypertension, essential Stable. -Resume Norvasc, HCTZ, Lasix, Imdur   DVT prophylaxis: Lovenox Code Status: DNR- Confirmed with patient at bedside Family Communication:  None at bedside.  Daughter 12/04/2021 is  HCPOA. Disposition Plan: ~ 1 - 2 days Consults called: Cards Admission status:  Obs tele  Author: 2022, MD 10/03/2021  11:17 PM  For on call review [email or web address].

## 2021-10-03 NOTE — ED Triage Notes (Signed)
Pt brought in by [facility] for c/o "feeling off"; pt c/o chest pain and headache  All sx started this am

## 2021-10-04 ENCOUNTER — Observation Stay (HOSPITAL_BASED_OUTPATIENT_CLINIC_OR_DEPARTMENT_OTHER): Payer: Medicare HMO

## 2021-10-04 ENCOUNTER — Encounter (HOSPITAL_COMMUNITY): Payer: Self-pay | Admitting: Internal Medicine

## 2021-10-04 DIAGNOSIS — I10 Essential (primary) hypertension: Secondary | ICD-10-CM | POA: Diagnosis not present

## 2021-10-04 DIAGNOSIS — Z951 Presence of aortocoronary bypass graft: Secondary | ICD-10-CM | POA: Diagnosis not present

## 2021-10-04 DIAGNOSIS — R079 Chest pain, unspecified: Secondary | ICD-10-CM | POA: Diagnosis not present

## 2021-10-04 LAB — ECHOCARDIOGRAM COMPLETE
AR max vel: 2.32 cm2
AV Area VTI: 2.54 cm2
AV Area mean vel: 2.38 cm2
AV Mean grad: 2 mmHg
AV Peak grad: 3.7 mmHg
Ao pk vel: 0.96 m/s
Area-P 1/2: 2.31 cm2
Height: 55 in
MV VTI: 2.08 cm2
S' Lateral: 2.2 cm
Weight: 2539.7 oz

## 2021-10-04 LAB — LIPID PANEL
Cholesterol: 178 mg/dL (ref 0–200)
HDL: 68 mg/dL (ref >40)
LDL Cholesterol: 93 mg/dL (ref 0–99)
Total CHOL/HDL Ratio: 2.6 RATIO
Triglycerides: 87 mg/dL (ref <150)
VLDL: 17 mg/dL (ref 0–40)

## 2021-10-04 LAB — C DIFFICILE QUICK SCREEN W PCR REFLEX
C Diff antigen: NEGATIVE
C Diff interpretation: NOT DETECTED
C Diff toxin: NEGATIVE

## 2021-10-04 MED ORDER — PERFLUTREN LIPID MICROSPHERE
1.0000 mL | INTRAVENOUS | Status: AC | PRN
  Administered 2021-10-04: 4 mL via INTRAVENOUS

## 2021-10-04 MED ORDER — ISOSORBIDE MONONITRATE ER 60 MG PO TB24
60.0000 mg | ORAL_TABLET | Freq: Every day | ORAL | Status: DC
  Administered 2021-10-04 – 2021-10-05 (×2): 60 mg via ORAL
  Filled 2021-10-04: qty 1

## 2021-10-04 MED ORDER — ALPRAZOLAM 0.25 MG PO TABS
0.2500 mg | ORAL_TABLET | Freq: Three times a day (TID) | ORAL | Status: DC | PRN
Start: 2021-10-04 — End: 2021-10-05
  Administered 2021-10-04: 0.12 mg via ORAL
  Administered 2021-10-04: 0.25 mg via ORAL
  Filled 2021-10-04 (×2): qty 1

## 2021-10-04 NOTE — Progress Notes (Signed)
*  PRELIMINARY RESULTS* Echocardiogram 2D Echocardiogram has been performed.  Sheryl Carter 10/04/2021, 1:52 PM

## 2021-10-04 NOTE — TOC Progression Note (Signed)
  Transition of Care [facility]) Screening Note   Patient Details  Name: Sheryl Carter Date of Birth: 1940/12/23   Transition of Care [facility]) CM/SW Contact:    Leitha Bleak, RN Phone Number: 10/04/2021, 11:39 AM  Stress test tomorrow, Possibly DC home after, TOC to follow.   Transition of Care Department [facility]) has reviewed patient and no TOC needs have been identified at this time. We will continue to monitor patient advancement through interdisciplinary progression rounds. If new patient transition needs arise, please place a TOC consult.   Expected Discharge Plan: Home w Home Health Services Barriers to Discharge: Continued Medical Work up  Expected Discharge Plan and Services Expected Discharge Plan: Home w Home Health Services

## 2021-10-04 NOTE — Progress Notes (Signed)
Patient with daughter at bedside. Resting comfortably, tolerating solid foods, no pain or concerns at this time.

## 2021-10-04 NOTE — Progress Notes (Signed)
PROGRESS NOTE    Sheryl Carter  FXT:[id number] DOB: July 06, 1940 DOA: 10/03/2021 PCP: Elfredia Nevins, MD    Brief Narrative:  81 year old female with a history of coronary artery disease status post CABG, hypertension, presents to the hospital with exertional chest pain and shortness of breath.  Cardiac enzymes and EKG unremarkable.  Seen by cardiology and due to concerning symptoms and her history, plans are for stress test in a.m.   Assessment & Plan:   Principal Problem:   Chest pain Active Problems:   Hypertension, essential   S/P CABG x 4   Chest pain -Cardiac enzymes negative and EKG is unremarkable -History somewhat concerning for angina -Seen by cardiology -Plans for stress test in a.m. -Echocardiogram shows normal EF with no WMA -Continue aspirin  CAD -Status post CABG -Continue on aspirin, Imdur, statin  Hypertension -Continue on amlodipine, hydrochlorothiazide  Hyperlipidemia -Continue statin   DVT prophylaxis: enoxaparin (LOVENOX) injection 40 mg Start: 10/04/21 0600  Code Status: DNR Family Communication: discussed with daughter at the bedside Disposition Plan: Status is: Observation The patient remains OBS appropriate and will d/c before 2 midnights.     Consultants:  cardiology  Procedures:    Antimicrobials:      Subjective: Had some chest pain on exertion, no chest pain at rest  Objective: Vitals:   10/04/21 0614 10/04/21 0751 10/04/21 1237 10/04/21 1539  BP: 136/65 (!) 141/72 114/65 110/62  Pulse: 62 65 79 77  Resp: 20 17 16    Temp: 98.2 F (36.8 C) 98 F (36.7 C) 98.2 F (36.8 C)   TempSrc: Oral Oral Oral   SpO2: 96% 97% 99% 95%  Weight:      Height:        Intake/Output Summary (Last 24 hours) at 10/04/2021 1722 Last data filed at 10/04/2021 1300 Gross per 24 hour  Intake 180 ml  Output --  Net 180 ml   Filed Weights   10/03/21 1231 10/03/21 1323 10/03/21 1921  Weight: 72.6 kg 72.6 kg 72 kg     Examination:  General exam: Appears calm and comfortable  Respiratory system: Clear to auscultation. Respiratory effort normal. Cardiovascular system: S1 & S2 heard, RRR. No JVD, murmurs, rubs, gallops or clicks. No pedal edema. Gastrointestinal system: Abdomen is nondistended, soft and nontender. No organomegaly or masses felt. Normal bowel sounds heard. Central nervous system: Alert and oriented. No focal neurological deficits. Extremities: Symmetric 5 x 5 power. Skin: No rashes, lesions or ulcers Psychiatry: Judgement and insight appear normal. Mood & affect appropriate.     Data Reviewed: I have personally reviewed following labs and imaging studies  CBC: Recent Labs  Lab 10/03/21 1321  WBC 6.0  HGB 14.3  HCT 41.8  MCV 94.6  PLT 206   Basic Metabolic Panel: Recent Labs  Lab 10/03/21 1321  NA 138  K 3.8  CL 102  CO2 29  GLUCOSE 99  BUN 10  CREATININE 0.66  CALCIUM 9.4   GFR: Estimated Creatinine Clearance: 42.1 mL/min (by C-G formula based on SCr of 0.66 mg/dL). Liver Function Tests: Recent Labs  Lab 10/03/21 1321  AST 31  ALT 20  ALKPHOS 65  BILITOT 1.3*  PROT 6.7  ALBUMIN 3.7   No results for input(s): "LIPASE", "AMYLASE" in the last 168 hours. No results for input(s): "AMMONIA" in the last 168 hours. Coagulation Profile: No results for input(s): "INR", "PROTIME" in the last 168 hours. Cardiac Enzymes: No results for input(s): "CKTOTAL", "CKMB", "CKMBINDEX", "TROPONINI" in the last 168  hours. BNP (last 3 results) No results for input(s): "PROBNP" in the last 8760 hours. HbA1C: No results for input(s): "HGBA1C" in the last 72 hours. CBG: No results for input(s): "GLUCAP" in the last 168 hours. Lipid Profile: No results for input(s): "CHOL", "HDL", "LDLCALC", "TRIG", "CHOLHDL", "LDLDIRECT" in the last 72 hours. Thyroid Function Tests: No results for input(s): "TSH", "T4TOTAL", "FREET4", "T3FREE", "THYROIDAB" in the last 72 hours. Anemia  Panel: No results for input(s): "VITAMINB12", "FOLATE", "FERRITIN", "TIBC", "IRON", "RETICCTPCT" in the last 72 hours. Sepsis Labs: No results for input(s): "PROCALCITON", "LATICACIDVEN" in the last 168 hours.  No results found for this or any previous visit (from the past 240 hour(s)).       Radiology Studies: ECHOCARDIOGRAM COMPLETE  Result Carter: 10/04/2021    ECHOCARDIOGRAM REPORT   Patient Name:   Sheryl Carter Carter of Exam: 10/04/2021 Medical Rec #:  [id number]          Height:       55.0 in Accession #:    [id number]         Weight:       158.7 lb Carter of Birth:  [birth year]           BSA:          1.589 m Patient Age:    81 years           BP:           141/72 mmHg Patient Gender: F                  HR:           77 bpm. [facility] Procedure: 2D Echo, Cardiac Doppler, Color Doppler and Intracardiac            Opacification Agent Indications:    Chest Pain  History:        Patient has prior history of Echocardiogram examinations, most                 recent 06/08/2016. Previous Myocardial Infarction and CAD, Prior                 CABG, TIA, Signs/Symptoms:Chest Pain; Risk Factors:Hypertension                 and Dyslipidemia.  Sonographer:    Mikki Harbor Referring Phys: [id number] LAURA R INGOLD IMPRESSIONS  1. DIfficult acoustic windows No definite wall motion abnormalities . Left ventricular ejection fraction, by estimation, is 60 to 65%. The left ventricle has normal function. The left ventricle has no regional wall motion abnormalities. Left ventricular  diastolic parameters are indeterminate.  2. Right ventricular systolic function is normal. The right ventricular size is normal.  3. Trivial mitral valve regurgitation.  4. The aortic valve was not well visualized. Aortic valve regurgitation is not visualized. Aortic valve sclerosis is present, with no evidence of aortic valve stenosis.  5. The inferior vena cava is normal in size with greater than 50% respiratory variability,  suggesting right atrial pressure of 3 mmHg. FINDINGS  Left Ventricle: DIfficult acoustic windows No definite wall motion abnormalities. Left ventricular ejection fraction, by estimation, is 60 to 65%. The left ventricle has normal function. The left ventricle has no regional wall motion abnormalities. Definity contrast agent was given IV to delineate the left ventricular endocardial borders. The left ventricular internal cavity size was small. There is no left ventricular hypertrophy. Left ventricular diastolic parameters are indeterminate. Right Ventricle: The  right ventricular size is normal. Right vetricular wall thickness was not assessed. Right ventricular systolic function is normal. Left Atrium: Left atrial size was normal in size. Right Atrium: Right atrial size was normal in size. Pericardium: There is no evidence of pericardial effusion. Mitral Valve: There is mild thickening of the mitral valve leaflet(s). Mild mitral annular calcification. Trivial mitral valve regurgitation. MV peak gradient, 4.2 mmHg. The mean mitral valve gradient is 2.0 mmHg. Tricuspid Valve: The tricuspid valve is grossly normal. Tricuspid valve regurgitation is trivial. Aortic Valve: The aortic valve was not well visualized. Aortic valve regurgitation is not visualized. Aortic valve sclerosis is present, with no evidence of aortic valve stenosis. Aortic valve mean gradient measures 2.0 mmHg. Aortic valve peak gradient measures 3.7 mmHg. Aortic valve area, by VTI measures 2.54 cm. Pulmonic Valve: The pulmonic valve was not well visualized. Aorta: The aortic root is normal in size and structure. Venous: The inferior vena cava is normal in size with greater than 50% respiratory variability, suggesting right atrial pressure of 3 mmHg. IAS/Shunts: No atrial level shunt detected by color flow Doppler.  LEFT VENTRICLE PLAX 2D LVIDd:         3.10 cm   Diastology LVIDs:         2.20 cm   LV e' medial:    6.74 cm/s LV PW:         1.00 cm    LV E/e' medial:  8.3 LV IVS:        1.10 cm   LV e' lateral:   8.16 cm/s LVOT diam:     1.90 cm   LV E/e' lateral: 6.9 LV SV:         52 LV SV Index:   33 LVOT Area:     2.84 cm  RIGHT VENTRICLE RV Basal diam:  2.55 cm RV Mid diam:    1.90 cm LEFT ATRIUM             Index        RIGHT ATRIUM           Index LA diam:        3.20 cm 2.01 cm/m   RA Area:     11.65 cm LA Vol (A2C):   35.2 ml 22.16 ml/m  RA Volume:   21.35 ml  13.44 ml/m LA Vol (A4C):   41.0 ml 25.81 ml/m LA Biplane Vol: 38.6 ml 24.30 ml/m  AORTIC VALVE                    PULMONIC VALVE AV Area (Vmax):    2.32 cm     PV Vmax:       0.67 m/s AV Area (Vmean):   2.38 cm     PV Peak grad:  1.8 mmHg AV Area (VTI):     2.54 cm AV Vmax:           95.70 cm/s AV Vmean:          62.300 cm/s AV VTI:            0.204 m AV Peak Grad:      3.7 mmHg AV Mean Grad:      2.0 mmHg LVOT Vmax:         78.30 cm/s LVOT Vmean:        52.200 cm/s LVOT VTI:          0.183 m LVOT/AV VTI ratio: 0.90  AORTA Ao Root diam: 3.10 cm MITRAL  VALVE MV Area (PHT): 2.31 cm    SHUNTS MV Area VTI:   2.08 cm    Systemic VTI:  0.18 m MV Peak grad:  4.2 mmHg    Systemic Diam: 1.90 cm MV Mean grad:  2.0 mmHg MV Vmax:       1.02 m/s MV Vmean:      62.9 cm/s MV Decel Time: 328 msec MV E velocity: 56.10 cm/s MV A velocity: 84.00 cm/s MV E/A ratio:  0.67 Dietrich Pates MD Electronically signed by Dietrich Pates MD Signature Carter/Time: 10/04/2021/5:12:18 PM    Final    DG Chest 2 View  Result Carter: 10/03/2021 CLINICAL DATA:  Chest pain EXAM: CHEST - 2 VIEW COMPARISON:  07/04/2021 FINDINGS: Chronic elevation of the left diaphragm. Mild bilateral interstitial thickening. No focal consolidation. No pleural effusion or pneumothorax. Heart and mediastinal contours are unremarkable. Prior CABG. No acute osseous abnormality. IMPRESSION: No active cardiopulmonary disease. Electronically Signed   By: Elige Ko M.D.   On: 10/03/2021 12:55        Scheduled Meds:  amLODipine  5 mg Oral Daily    aspirin EC  81 mg Oral Q breakfast   enoxaparin (LOVENOX) injection  40 mg Subcutaneous Q24H   furosemide  20 mg Oral Daily   hydrochlorothiazide  25 mg Oral Daily   HYDROcodone-acetaminophen  1 tablet Oral Once   isosorbide mononitrate  60 mg Oral Daily   rosuvastatin  5 mg Oral Daily   timolol  1 drop Right Eye BID   Continuous Infusions:   LOS: 0 days    Time spent:    Erick Blinks, MD [facility]   If 7PM-7AM, please contact night-coverage [email or web address]  10/04/2021, 5:22 PM

## 2021-10-04 NOTE — Care Management Obs Status (Signed)
MEDICARE OBSERVATION STATUS NOTIFICATION   Patient Details  Name: Sheryl Carter MRN: [id number] Date of Birth: [birth year]   Medicare Observation Status Notification Given:  Yes    Corey Harold 10/04/2021, 3:43 PM

## 2021-10-04 NOTE — Consult Note (Addendum)
Cardiology Consultation:   Patient ID: Sheryl Carter MRN: [id number]; DOB: Nov 02, 1940  Admit date: 10/03/2021 Date of Consult: 10/04/2021  PCP:  Elfredia Nevins, MD   [facility] Providers Cardiologist:  Prentice Docker, MD (Inactive)        Patient Profile:   Sheryl Carter is a 81 y.o. female with a hx of CAD with CABG 2018, HTN, HLD, carotid artery stenosis, Lt CEA and prior strokes who is being seen 10/04/2021 for the evaluation of chest pain at the request of Dr. Kerry Hough.  History of Present Illness:    Sheryl Carter is an 81 yo with a hx of CV dz and CAD.  She underwent  L CEA in March 2018  Post surgery had elevated troponin .  Went on to have cath which demonstrated severe 3 V dz.  She underwent  CABG X 4 (LIMA to LAD, rSVG to 2nd diag, rSVG to 1st om, rSVG to distal RCA)  Echo with EF 50-55% with inf. Hypokinesis. The pt also has a hx of L eye blindness, myalgias with higher statin doses.    She was seen in Nov 2022 with chest pain for 2 days. Was tender to palpation. Troponins neg. No follow up with cardiology at that time.  Pt presented to ER 10/03/21 with feeling off with headache and chest pain  The patient says she hasn't been feeling good for about a week or two    Fatigued     Over this time she has had some chest discomfort  SHe says she  has been having some intermittent chest pains  L sided   Stabbing   Also in left arm.  Transient     Not associated with activity    She also note some chest pressure   Again, not always with activity   Yesterday she had on and off   Did have some pressure after returning from bathroom  Sl SOB  Admitted for observation.   Yesterday she had some diarrhea  This has continued today  Today she is also having some nausea  Today she has had some CP (sharp) again  Lasted 1-2 min.  Also had some chest pressure earlier     The patient also notes occasional L jaw numbness    No fevers  Always chilled  (since CABG) Na 138, K+ 3.8 BUN 10,  Cr 0.66  Hs troponin 5, 4 WBC 6, Hgb 14.3 plts 206 2V CXR NAD   .EKG:  The EKG was personally reviewed and demonstrates:  SR rate in 60s, non specific ST and T wave abnormality no acute changes from 07/05/21. Follow up EKGss similar with LVH  Telemetry:  Telemetry was personally reviewed and demonstrates:  SR  to SB at 0500 to 49 also at times occ PVCs   BP 149/75 in ER now 141/72 P 65 R 17 afebrile    Past Medical History:  Diagnosis Date   Anxiety    Blindness of left eye    CAD (coronary artery disease)    a. s/p CABG in 05/2016 with LIMA-LAD, SVG-D2, SVG-OM, SVG-RCA   Complication of anesthesia    has gas trapped in her body after   DVT (deep venous thrombosis) (HCC)    Ear problems    Family history of adverse reaction to anesthesia    daughter has gas trapped in her body postop as well   Glaucoma    Hypercholesterolemia    Hypertension    Ischemic cardiomyopathy  a. 05/2016 Echo: EF 50-55%, inf HK.   Pre-diabetes    [facility], HgbA1c was 5.4   Stroke (HCC)    having TIA's    Past Surgical History:  Procedure Laterality Date   BREAST SURGERY Left    cyst- L breast   CHOLECYSTECTOMY     CORONARY ARTERY BYPASS GRAFT N/A 06/09/2016   Procedure: CORONARY ARTERY BYPASS GRAFTING (CABG) times 4 using the left internal mammary artery and bilateral thigh greater saphenous veins harvested endoscopically. LIMA to LAD, SVG to DIAGONAL 2, SVG to OM, SVG to RCA;  Surgeon: Delight OvensEdward B Gerhardt, MD;  [facility];  Service: Open Heart Surgery;  Laterality: N/A;   ENDARTERECTOMY Left 05/29/2016   Procedure: LEFT CAROTID ENDARTERECTOMY WITH PATCH ANGIOPLASTY;  Surgeon: Larina Earthlyodd F Early, MD;  [facility];  Service: Vascular;  Laterality: Left;   EYE SURGERY Right    cataract   LEFT HEART CATH AND CORONARY ANGIOGRAPHY N/A 06/07/2016   Procedure: Left Heart Cath and Coronary Angiography;  Surgeon: Iran OuchMuhammad A Arida, MD;  Location: [facility] INVASIVE CV LAB;   Service: Cardiovascular;  Laterality: N/A;   TEE WITHOUT CARDIOVERSION N/A 06/09/2016   Procedure: TRANSESOPHAGEAL ECHOCARDIOGRAM (TEE);  Surgeon: Delight OvensEdward B Gerhardt, MD;  [facility];  Service: Open Heart Surgery;  Laterality: N/A;     Home Medications:  Prior to Admission medications   Medication Sig Start Date End Date Taking? Authorizing Provider  ALPRAZolam (XANAX) 0.25 MG tablet Take 0.25 mg by mouth 2 (two) times daily. *May take one additional dose at lunch as needed   Yes [provider]  amLODipine (NORVASC) 5 MG tablet Take 1 tablet (5 mg total) by mouth daily. 12/31/19 10/03/21 Yes Strader, Lennart PallBrittany M, PA-C  antiseptic oral rinse (BIOTENE) LIQD 15 mLs by Mouth Rinse route as needed for dry mouth.   Yes [provider]  aspirin EC 81 MG tablet Take 1 tablet (81 mg total) by mouth daily with breakfast. 03/18/19  Yes Emokpae, Courage, MD  fexofenadine (ALLEGRA) 180 MG tablet Take 180 mg by mouth daily.   Yes [provider]  furosemide (LASIX) 20 MG tablet Take 1 tablet (20 mg total) by mouth daily. 01/31/21  Yes Blue, Soijett A, PA-C  hydrochlorothiazide (HYDRODIURIL) 25 MG tablet Take 25 mg by mouth daily. 09/19/21  Yes [provider]  isosorbide mononitrate (IMDUR) 30 MG 24 hr tablet TAKE 1/2 TABLET BY MOUTH DAILY. Patient taking differently: Take 30 mg by mouth daily. 12/06/20  Yes Strader, GrenadaBrittany M, PA-C  nitroGLYCERIN (NITROSTAT) 0.4 MG SL tablet DISSOLVE (1) TABLET UNDER THE TONGUE EVERY 5 MINUTES AS NEEDED FOR CHEST PAIN. Patient taking differently: Place 0.4 mg under the tongue every 5 (five) minutes as needed. 02/07/21  Yes BranchDorothe Pea, Jonathan F, MD  Omega-3 Fatty Acids (FISH OIL) 1000 MG CAPS Take 1,000 mg by mouth daily.   Yes [provider]  rosuvastatin (CRESTOR) 5 MG tablet TAKE 1 TABLET BY MOUTH ONCE A DAY. Patient taking differently: Take 5 mg by mouth daily. 03/07/21  Yes Strader, GrenadaBrittany M, PA-C  timolol (BETIMOL) 0.5 %  ophthalmic solution Place 1 drop into the right eye 2 (two) times daily.    Yes [provider]  vitamin B-12 (CYANOCOBALAMIN) 1000 MCG tablet Take 1,000 mcg by mouth daily.   Yes [provider]  potassium chloride 20 MEQ TBCR Take 20 mEq by mouth daily. Patient not taking: Reported on 10/03/2021 07/04/21  Burgess Amor, PA-C    Inpatient Medications: Scheduled Meds:  amLODipine  5 mg Oral Daily   aspirin EC  81 mg Oral Q breakfast   enoxaparin (LOVENOX) injection  40 mg Subcutaneous Q24H   furosemide  20 mg Oral Daily   hydrochlorothiazide  25 mg Oral Daily   HYDROcodone-acetaminophen  1 tablet Oral Once   isosorbide mononitrate  30 mg Oral Daily   rosuvastatin  5 mg Oral Daily   timolol  1 drop Right Eye BID   Continuous Infusions:  PRN Meds: ALPRAZolam, nitroGLYCERIN, ondansetron **OR** ondansetron (ZOFRAN) IV, polyethylene glycol  Allergies:    Allergies  Allergen Reactions   Other Hives    Mycins-   Pt has Glaucoma in Right eye, Blind in Left eye .... PLEASE DO NOT GIVE ANYTHING TO PATIENT THAT MIGHT DESTROY VISION    Prednisone Other (See Comments)    Altered mental status   Statins Other (See Comments)    MYALGIAS Weakness, muscle aches, and pain   Erythromycin     UNSPECIFIED REACTION    Tylenol [Acetaminophen] Other (See Comments)    Hallucinations    Social History:   Social History   Socioeconomic History   Marital status: Divorced    Spouse name: Not on file   Number of children: 4   Years of education: 10   Highest education level: Not on file  Occupational History    Comment: retired  Tobacco Use   Smoking status: Never   Smokeless tobacco: Never  Vaping Use   Vaping Use: Never used  Substance and Sexual Activity   Alcohol use: No   Drug use: No   Sexual activity: Not Currently  Other Topics Concern   Not on file  Social History Narrative   Lives with daughter   No caffeine since Dec 2017   Social Determinants of Health    Financial Resource Strain: Not on file  Food Insecurity: Not on file  Transportation Needs: Not on file  Physical Activity: Not on file  Stress: Not on file  Social Connections: Not on file  Intimate Partner Violence: Not on file    Family History:    Family History  Problem Relation Age of Onset   Heart disease Sister    Heart disease Son      ROS:  Please see the history of present illness.  General:no colds or fevers, no weight changes Skin:no rashes or ulcers HEENT:no blurred vision, no congestion CV:see HPI PUL:see HPI GI:no diarrhea constipation or melena, no indigestion GU:no hematuria, no dysuria MS:no joint pain, no claudication, lower ext edema at times she admits to eating salty foods as well.   Neuro:no syncope, no lightheadedness Endo:pre- diabetes, no thyroid disease  All other ROS reviewed and negative.     Physical Exam/Data:   Vitals:   10/03/21 2142 10/04/21 0203 10/04/21 0614 10/04/21 0751  BP: (!) 148/96 (!) 149/75 136/65 (!) 141/72  Pulse: 62 67 62 65  Resp: 18 20 20 17   Temp: 97.7 F (36.5 C) 97.7 F (36.5 C) 98.2 F (36.8 C) 98 F (36.7 C)  TempSrc: Oral Oral Oral Oral  SpO2: 98% 96% 96% 97%  Weight:      Height:       No intake or output data in the 24 hours ending 10/04/21 0803    10/03/2021    7:21 PM 10/03/2021    1:23 PM 10/03/2021   12:31 PM  Last 3 Weights  Weight (lbs) 158 lb  11.7 oz 160 lb 160 lb  Weight (kg) 72 kg 72.576 kg 72.576 kg     Body mass index is 36.89 kg/m.  General:  Well nourished, well developed, in no acute distress HEENT: normal without teeth Neck: no JVD Vascular: No carotid bruits; Distal pulses 2+ bilaterally Cardiac:  normal S1, S2; RRR; no murmur gallup rub or click Lungs:  clear to auscultation bilaterally, no wheezing, rhonchi or rales  Abd: soft, nontender, no hepatomegaly  Ext: no edema Musculoskeletal:  No deformities, BUE and BLE strength normal and equal Skin: warm and dry  Neuro:  CNs  2-12 intact, no focal abnormalities noted Psych:  Normal affect    Relevant CV Studies: Carotid dopplers 01/25/21    IMPRESSION: Minor carotid atherosclerosis. Negative for stenosis. Degree of narrowing less than 50% bilaterally by ultrasound criteria.   Patent antegrade vertebral flow bilaterally    TEE echo in OR  06/09/16 Study Conclusions   - Left ventricle: Systolic function was normal. The estimated    ejection fraction was in the range of 50% to 55%. Hypokinesis of    the inferior myocardium, more pronounced at the base.  - Staged echo: Limited post-CPB exam: Improved LV function. The    inferior hypokinesis has recovered function. Overall EF 50-55%.   Impressions:   - LV function improvement from pre-bypass images. No other change    from pre-bypass images.   LImited echo 06/08/2016 - Left ventricle: The cavity size was normal. There was mild focal    basal hypertrophy of the septum. Systolic function was normal.    The estimated ejection fraction was in the range of 50% to 55%.    There is akinesis of the inferior myocardium. There is    hypokinesis of the midinferoseptal myocardium.  - Mitral valve: Calcified annulus. There was trivial regurgitation.   L heart Cath 05/10/2016  Prox RCA lesion, 80 %stenosed. Mid RCA lesion, 100 %stenosed. Ost Cx to Prox Cx lesion, 90 %stenosed. Ost LAD lesion, 90 %stenosed. LM lesion, 80 %stenosed. Prox LAD lesion, 90 %stenosed. Ost 1st Diag to 1st Diag lesion, 80 %stenosed. Ost 2nd Mrg to 2nd Mrg lesion, 60 %stenosed. LV end diastolic pressure is moderately elevated.   1. Severe three-vessel and distal left main disease. 2. Moderately elevated left ventricular end-diastolic pressure. 3. Left ventricular angiography was not performed. EF was 50-55% by echo.   Recommendations: I consulted CVTS for CABG. The patient has good targets. It was determined that she had no stroke during this admission. Avoid cardiac catheterization  via the right radial artery in the future due to severe innominate artery tortuosity.  Laboratory Data:  High Sensitivity Troponin:   Recent Labs  Lab 10/03/21 1321 10/03/21 1434  TROPONINIHS 5 4     Chemistry Recent Labs  Lab 10/03/21 1321  NA 138  K 3.8  CL 102  CO2 29  GLUCOSE 99  BUN 10  CREATININE 0.66  CALCIUM 9.4  GFRNONAA >60  ANIONGAP 7    Recent Labs  Lab 10/03/21 1321  PROT 6.7  ALBUMIN 3.7  AST 31  ALT 20  ALKPHOS 65  BILITOT 1.3*   Lipids No results for input(s): "CHOL", "TRIG", "HDL", "LABVLDL", "LDLCALC", "CHOLHDL" in the last 168 hours.  Hematology Recent Labs  Lab 10/03/21 1321  WBC 6.0  RBC 4.42  HGB 14.3  HCT 41.8  MCV 94.6  MCH 32.4  MCHC 34.2  RDW 12.2  PLT 206   Thyroid No results for input(s): "TSH", "  FREET4" in the last 168 hours.  BNPNo results for input(s): "BNP", "PROBNP" in the last 168 hours.  DDimer No results for input(s): "DDIMER" in the last 168 hours.   Radiology/Studies:  DG Chest 2 View  Result Date: 10/03/2021 CLINICAL DATA:  Chest pain EXAM: CHEST - 2 VIEW COMPARISON:  07/04/2021 FINDINGS: Chronic elevation of the left diaphragm. Mild bilateral interstitial thickening. No focal consolidation. No pleural effusion or pneumothorax. Heart and mediastinal contours are unremarkable. Prior CABG. No acute osseous abnormality. IMPRESSION: No active cardiopulmonary disease. Electronically Signed   By: Elige Ko M.D.   On: 10/03/2021 12:55     Assessment and Plan:   Chest pain  Atypical with neg troponins and no acute EKG changes, some non specific ST-T waves abnormalities.  Will check echo with hx of EF 50-55%. Though another episode this AM.  I am not convinced it is cardiac but with her hx and no issues until now since her CABG would benefit from stress test.  Will check Echo today and lexiscan myoview tomorrow.    2.   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 Neg troponins. Plan nuc study tomorrow   3.  HTN on  amlodipine, HCTZ lasix and imdur increase dose of imdur   4.  HL Continue statin  Get lipids  5  CV dz  s/p L CEA   last carotid USN in 11/22  no severe Dz     6.  SB at times to 49 and occ PVCs      For questions or updates, please contact [facility] Please consult [email or web address] for contact info under    Signed, Nada Boozer, NP  10/04/2021 8:03 AM   Patient seen and examined     I have amended note above by L INgold to reflect my findings    The pt is an 81 yo who presented yesterday to [facility] ED for evaluation of CP Hx of MI after CEA in 2018.  Went on to have cath then CABG in 2018 Over the past week or two she has not felt right   Fatigued   She has had intermittent episodes of chest discomfort  Some is sharp L sided   Other is a pressure   notes fatigue  maybe mild SOB    In addition to this she now has some GI symptoms   (diarrhea, N/)  On exam, Pt is in NAD Neck   JVP is normal   No bruit Lungs are CTA Cardiac exam  RRR  No S3  no siginificant murmurs   Abd is supple  No masses  No hepatomegaly Ext with good pulses  No edema   I have reviewed echo from today   LVEF and RVEF are normal     Impression: CP   Some appears more muscular-- the sharp / stabbing pain   Will change if she moves arm/body   the other pressure is not clear.   It is suspicious that the pt has concurrent GI symptoms that it may be related to that     The GI symptoms did start after arrival to ED.   Also concerning that it may be cardiac   Will plan on lexiscan myoview, if she is not having active diarrhea, to rule out large area of ischemia     Keep on same meds for now  \\  Will continue to follow  Dietrich Pates MD

## 2021-10-05 ENCOUNTER — Encounter (HOSPITAL_COMMUNITY): Payer: Self-pay | Admitting: Internal Medicine

## 2021-10-05 ENCOUNTER — Observation Stay (HOSPITAL_BASED_OUTPATIENT_CLINIC_OR_DEPARTMENT_OTHER): Payer: Medicare HMO

## 2021-10-05 DIAGNOSIS — R079 Chest pain, unspecified: Secondary | ICD-10-CM

## 2021-10-05 DIAGNOSIS — R072 Precordial pain: Secondary | ICD-10-CM | POA: Diagnosis not present

## 2021-10-05 DIAGNOSIS — Z951 Presence of aortocoronary bypass graft: Secondary | ICD-10-CM | POA: Diagnosis not present

## 2021-10-05 DIAGNOSIS — I10 Essential (primary) hypertension: Secondary | ICD-10-CM | POA: Diagnosis not present

## 2021-10-05 LAB — NM MYOCAR MULTI W/SPECT W/WALL MOTION / EF
LV dias vol: 62 mL (ref 46–106)
LV sys vol: 23 mL
Nuc Stress EF: 62 %
Peak HR: 94 {beats}/min
RATE: 0.4
Rest HR: 57 {beats}/min
Rest Nuclear Isotope Dose: 11 mCi
SDS: 1
SRS: 0
SSS: 1
ST Depression (mm): 0 mm
Stress Nuclear Isotope Dose: 31.3 mCi
TID: 1.3

## 2021-10-05 MED ORDER — TRAZODONE HCL 50 MG PO TABS: 50.0000 mg | ORAL_TABLET | Freq: Every evening | ORAL | Status: DC | PRN

## 2021-10-05 MED ORDER — TECHNETIUM TC 99M TETROFOSMIN IV KIT
30.0000 | PACK | Freq: Once | INTRAVENOUS | Status: AC | PRN
  Administered 2021-10-05: 31.3 via INTRAVENOUS

## 2021-10-05 MED ORDER — HYDRALAZINE HCL 20 MG/ML IJ SOLN: 10.0000 mg | INTRAMUSCULAR | Status: DC | PRN

## 2021-10-05 MED ORDER — SENNOSIDES-DOCUSATE SODIUM 8.6-50 MG PO TABS: 1.0000 | ORAL_TABLET | Freq: Every evening | ORAL | Status: DC | PRN

## 2021-10-05 MED ORDER — GUAIFENESIN 100 MG/5ML PO LIQD: 5.0000 mL | ORAL | Status: DC | PRN

## 2021-10-05 MED ORDER — REGADENOSON 0.4 MG/5ML IV SOLN
INTRAVENOUS | Status: AC
  Administered 2021-10-05: 0.4 mg via INTRAVENOUS
  Filled 2021-10-05: qty 5

## 2021-10-05 MED ORDER — EZETIMIBE 10 MG PO TABS
10.0000 mg | ORAL_TABLET | Freq: Every day | ORAL | Status: DC
  Administered 2021-10-05: 10 mg via ORAL
  Filled 2021-10-05: qty 1

## 2021-10-05 MED ORDER — LIDOCAINE 5 % EX PTCH: 1.0000 | MEDICATED_PATCH | CUTANEOUS | 0 refills | Status: DC

## 2021-10-05 MED ORDER — ISOSORBIDE MONONITRATE ER 60 MG PO TB24: 60.0000 mg | ORAL_TABLET | Freq: Every day | ORAL | 0 refills | Status: DC

## 2021-10-05 MED ORDER — LIDOCAINE 5 % EX PTCH
1.0000 | MEDICATED_PATCH | CUTANEOUS | Status: DC
  Administered 2021-10-05: 1 via TRANSDERMAL
  Filled 2021-10-05: qty 1

## 2021-10-05 MED ORDER — SODIUM CHLORIDE FLUSH 0.9 % IV SOLN
INTRAVENOUS | Status: AC
  Administered 2021-10-05: 10 mL via INTRAVENOUS
  Filled 2021-10-05: qty 10

## 2021-10-05 MED ORDER — EZETIMIBE 10 MG PO TABS: 10.0000 mg | ORAL_TABLET | Freq: Every day | ORAL | 3 refills | Status: DC

## 2021-10-05 MED ORDER — OXYCODONE HCL 5 MG PO TABS: 5.0000 mg | ORAL_TABLET | ORAL | Status: DC | PRN

## 2021-10-05 MED ORDER — TECHNETIUM TC 99M TETROFOSMIN IV KIT
10.0000 | PACK | Freq: Once | INTRAVENOUS | Status: AC | PRN
  Administered 2021-10-05: 11 via INTRAVENOUS

## 2021-10-05 MED ORDER — IPRATROPIUM-ALBUTEROL 0.5-2.5 (3) MG/3ML IN SOLN: 3.0000 mL | RESPIRATORY_TRACT | Status: DC | PRN

## 2021-10-05 NOTE — Discharge Summary (Signed)
Physician Discharge Summary  Sheryl Carter [id number] DOB: 03-02-1941 DOA: 10/03/2021  PCP: Redmond School, MD  Admit date: 10/03/2021 Discharge date: 10/05/2021  Admitted From: Home Disposition:  Home  Recommendations for Outpatient Follow-up:  Follow up with PCP in 1-2 weeks Please obtain BMP/CBC in one week your next doctors visit.  Imdur increased to 60 mg daily Continue Lasix.  Eventually Aldactone can be added if necessary Discontinue HCTZ Zetia 10 mg daily Lidocaine patch  Home Health: None Equipment/Devices:None Discharge Condition: Stable CODE STATUS: DNR Diet recommendation: Cardiac  Brief/Interim Summary: 81 year old female with a history of coronary artery disease status post CABG, hypertension, presents to the hospital with exertional chest pain and shortness of breath.  Cardiac enzymes and EKG unremarkable.  Echocardiogram showed EF 60% with no wall motion abnormality.  Cardiology recommended stress test which was negative. Imdur was increased. She is cleared for dc.   Tandy Gaw, her daughter, updated by me.  All the questions answered.   Assessment & Plan:  Principal Problem:   Chest pain Active Problems:   Hypertension, essential   S/P CABG x 4         Chest pain History of CAD status post CABG 2018 - EKG unremarkable.  Troponins remain negative.  LDL 93 - Echocardiogram shows EF 60% with no wall motion abnormality - Cardiology following - Nuc stress test -unremarkable. - Continue aspirin, statin, Imdur.  Imdur adjusted per cardiology   Essential hypertension - Continue Norvasc, Lasix.  HCTZ discontinued.  Imdur increased.   Hyperlipidemia -Crestor 5 mg daily.  LDL 93.  Added Zetia.     Discharge Diagnoses:  Principal Problem:   Chest pain Active Problems:   Hypertension, essential   S/P CABG x 4      Consultations: Cardiology.   Subjective: Patient remains chest pain-free, tolerated stress test.  No complaints.  Also spoke  with her daughter over the phone and updated her.  All questions answered by me.  Discharge Exam: Vitals:   10/04/21 2041 10/05/21 0538  BP: 113/72 135/74  Pulse: 76 83  Resp: 19 19  Temp: 97.9 F (36.6 C) 98 F (36.7 C)  SpO2: 97% 97%   Vitals:   10/04/21 1237 10/04/21 1539 10/04/21 2041 10/05/21 0538  BP: 114/65 110/62 113/72 135/74  Pulse: 79 77 76 83  Resp: 16  19 19   Temp: 98.2 F (36.8 C)  97.9 F (36.6 C) 98 F (36.7 C)  TempSrc: Oral  Oral   SpO2: 99% 95% 97% 97%  Weight:      Height:        General: Pt is alert, awake, not in acute distress Cardiovascular: RRR, S1/S2 +, no rubs, no gallops Respiratory: CTA bilaterally, no wheezing, no rhonchi Abdominal: Soft, NT, ND, bowel sounds + Extremities: no edema, no cyanosis  Discharge Instructions   Allergies as of 10/05/2021       Reactions   Other Hives   Mycins-  Pt has Glaucoma in Right eye, Blind in Left eye .... PLEASE DO NOT GIVE ANYTHING TO PATIENT THAT MIGHT DESTROY VISION    Prednisone Other (See Comments)   Altered mental status   Statins Other (See Comments)   MYALGIAS Weakness, muscle aches, and pain   Erythromycin    UNSPECIFIED REACTION    Tylenol [acetaminophen] Other (See Comments)   Hallucinations        Medication List     STOP taking these medications    hydrochlorothiazide 25 MG tablet Commonly known as: HYDRODIURIL  TAKE these medications    ALPRAZolam 0.25 MG tablet Commonly known as: XANAX Take 0.25 mg by mouth 2 (two) times daily. *May take one additional dose at lunch as needed   amLODipine 5 MG tablet Commonly known as: NORVASC Take 1 tablet (5 mg total) by mouth daily.   antiseptic oral rinse Liqd 15 mLs by Mouth Rinse route as needed for dry mouth.   aspirin EC 81 MG tablet Take 1 tablet (81 mg total) by mouth daily with breakfast.   ezetimibe 10 MG tablet Commonly known as: ZETIA Take 1 tablet (10 mg total) by mouth daily. Start taking on: October 06, 2021   fexofenadine 180 MG tablet Commonly known as: ALLEGRA Take 180 mg by mouth daily.   Fish Oil 1000 MG Caps Take 1,000 mg by mouth daily.   furosemide 20 MG tablet Commonly known as: LASIX Take 1 tablet (20 mg total) by mouth daily.   isosorbide mononitrate 60 MG 24 hr tablet Commonly known as: IMDUR Take 1 tablet (60 mg total) by mouth daily. Start taking on: October 06, 2021 What changed:  medication strength how much to take   lidocaine 5 % Commonly known as: LIDODERM Place 1 patch onto the skin daily. Remove & Discard patch within 12 hours or as directed by MD Start taking on: October 06, 2021   nitroGLYCERIN 0.4 MG SL tablet Commonly known as: NITROSTAT DISSOLVE (1) TABLET UNDER THE TONGUE EVERY 5 MINUTES AS NEEDED FOR CHEST PAIN. What changed: See the new instructions.   Potassium Chloride ER 20 MEQ Tbcr Take 20 mEq by mouth daily.   rosuvastatin 5 MG tablet Commonly known as: CRESTOR TAKE 1 TABLET BY MOUTH ONCE A DAY.   timolol 0.5 % ophthalmic solution Commonly known as: BETIMOL Place 1 drop into the right eye 2 (two) times daily.   vitamin B-12 1000 MCG tablet Commonly known as: CYANOCOBALAMIN Take 1,000 mcg by mouth daily.        Follow-up Information     Ellsworth Lennox, PA-C Follow up.   Specialties: Physician Assistant, Cardiology Why: Cardiology Follow-up on 11/11/2021 at 3:30 PM. Contact information: [address] [place] [address] [phone]                Allergies  Allergen Reactions   Other Hives    Mycins-   Pt has Glaucoma in Right eye, Blind in Left eye .... PLEASE DO NOT GIVE ANYTHING TO PATIENT THAT MIGHT DESTROY VISION    Prednisone Other (See Comments)    Altered mental status   Statins Other (See Comments)    MYALGIAS Weakness, muscle aches, and pain   Erythromycin     UNSPECIFIED REACTION    Tylenol [Acetaminophen] Other (See Comments)    Hallucinations    You were cared for by a hospitalist  during your hospital stay. If you have any questions about your discharge medications or the care you received while you were in the hospital after you are discharged, you can call the unit and asked to speak with the hospitalist on call if the hospitalist that took care of you is not available. Once you are discharged, your primary care physician will handle any further medical issues. Please note that no refills for any discharge medications will be authorized once you are discharged, as it is imperative that you return to your primary care physician (or establish a relationship with a primary care physician if you do not have one) for your aftercare  needs so that they can reassess your need for medications and monitor your lab values.   Procedures/Studies: NM Myocar Multi W/Spect W/Wall Motion / EF  Result Date: 10/05/2021   LV perfusion is normal. There is no evidence of ischemia. There is no evidence of infarction. Diaphragm attenuation noted. TID ratio borderline for pharmacologic stress (1.3) but visually there is no TID. Average quality study likely explains borderline numerical value. Overall, perfusion is normal, no TID, normal study.   Left ventricular function is normal. Nuclear stress EF: 62 %. The left ventricular ejection fraction is normal (55-65%). End diastolic cavity size is normal.   The study is normal. The study is low risk.   ECHOCARDIOGRAM COMPLETE  Result Date: 10/04/2021    ECHOCARDIOGRAM REPORT   Patient Name:   Sheryl Carter Date of Exam: 10/04/2021 Medical Rec #:  [id number]          Height:       55.0 in Accession #:    [id number]         Weight:       158.7 lb Date of Birth:  Apr 13, 1940           BSA:          1.589 m Patient Age:    81 years           BP:           141/72 mmHg Patient Gender: F                  HR:           77 bpm. [facility] Procedure: 2D Echo, Cardiac Doppler, Color Doppler and Intracardiac            Opacification Agent Indications:     Chest Pain  History:        Patient has prior history of Echocardiogram examinations, most                 recent 06/08/2016. Previous Myocardial Infarction and CAD, Prior                 CABG, TIA, Signs/Symptoms:Chest Pain; Risk Factors:Hypertension                 and Dyslipidemia.  Sonographer:    Mikki Harbor Referring Phys: [id number] LAURA R INGOLD IMPRESSIONS  1. DIfficult acoustic windows No definite wall motion abnormalities . Left ventricular ejection fraction, by estimation, is 60 to 65%. The left ventricle has normal function. The left ventricle has no regional wall motion abnormalities. Left ventricular  diastolic parameters are indeterminate.  2. Right ventricular systolic function is normal. The right ventricular size is normal.  3. Trivial mitral valve regurgitation.  4. The aortic valve was not well visualized. Aortic valve regurgitation is not visualized. Aortic valve sclerosis is present, with no evidence of aortic valve stenosis.  5. The inferior vena cava is normal in size with greater than 50% respiratory variability, suggesting right atrial pressure of 3 mmHg. FINDINGS  Left Ventricle: DIfficult acoustic windows No definite wall motion abnormalities. Left ventricular ejection fraction, by estimation, is 60 to 65%. The left ventricle has normal function. The left ventricle has no regional wall motion abnormalities. Definity contrast agent was given IV to delineate the left ventricular endocardial borders. The left ventricular internal cavity size was small. There is no left ventricular hypertrophy. Left ventricular diastolic parameters are indeterminate. Right Ventricle: The right ventricular size is normal. Right  vetricular wall thickness was not assessed. Right ventricular systolic function is normal. Left Atrium: Left atrial size was normal in size. Right Atrium: Right atrial size was normal in size. Pericardium: There is no evidence of pericardial effusion. Mitral Valve: There is mild  thickening of the mitral valve leaflet(s). Mild mitral annular calcification. Trivial mitral valve regurgitation. MV peak gradient, 4.2 mmHg. The mean mitral valve gradient is 2.0 mmHg. Tricuspid Valve: The tricuspid valve is grossly normal. Tricuspid valve regurgitation is trivial. Aortic Valve: The aortic valve was not well visualized. Aortic valve regurgitation is not visualized. Aortic valve sclerosis is present, with no evidence of aortic valve stenosis. Aortic valve mean gradient measures 2.0 mmHg. Aortic valve peak gradient measures 3.7 mmHg. Aortic valve area, by VTI measures 2.54 cm. Pulmonic Valve: The pulmonic valve was not well visualized. Aorta: The aortic root is normal in size and structure. Venous: The inferior vena cava is normal in size with greater than 50% respiratory variability, suggesting right atrial pressure of 3 mmHg. IAS/Shunts: No atrial level shunt detected by color flow Doppler.  LEFT VENTRICLE PLAX 2D LVIDd:         3.10 cm   Diastology LVIDs:         2.20 cm   LV e' medial:    6.74 cm/s LV PW:         1.00 cm   LV E/e' medial:  8.3 LV IVS:        1.10 cm   LV e' lateral:   8.16 cm/s LVOT diam:     1.90 cm   LV E/e' lateral: 6.9 LV SV:         52 LV SV Index:   33 LVOT Area:     2.84 cm  RIGHT VENTRICLE RV Basal diam:  2.55 cm RV Mid diam:    1.90 cm LEFT ATRIUM             Index        RIGHT ATRIUM           Index LA diam:        3.20 cm 2.01 cm/m   RA Area:     11.65 cm LA Vol (A2C):   35.2 ml 22.16 ml/m  RA Volume:   21.35 ml  13.44 ml/m LA Vol (A4C):   41.0 ml 25.81 ml/m LA Biplane Vol: 38.6 ml 24.30 ml/m  AORTIC VALVE                    PULMONIC VALVE AV Area (Vmax):    2.32 cm     PV Vmax:       0.67 m/s AV Area (Vmean):   2.38 cm     PV Peak grad:  1.8 mmHg AV Area (VTI):     2.54 cm AV Vmax:           95.70 cm/s AV Vmean:          62.300 cm/s AV VTI:            0.204 m AV Peak Grad:      3.7 mmHg AV Mean Grad:      2.0 mmHg LVOT Vmax:         78.30 cm/s LVOT Vmean:         52.200 cm/s LVOT VTI:          0.183 m LVOT/AV VTI ratio: 0.90  AORTA Ao Root diam: 3.10 cm MITRAL VALVE MV Area (PHT): 2.31  cm    SHUNTS MV Area VTI:   2.08 cm    Systemic VTI:  0.18 m MV Peak grad:  4.2 mmHg    Systemic Diam: 1.90 cm MV Mean grad:  2.0 mmHg MV Vmax:       1.02 m/s MV Vmean:      62.9 cm/s MV Decel Time: 328 msec MV E velocity: 56.10 cm/s MV A velocity: 84.00 cm/s MV E/A ratio:  0.67 Dorris Carnes MD Electronically signed by Dorris Carnes MD Signature Date/Time: 10/04/2021/5:12:18 PM    Final    DG Chest 2 View  Result Date: 10/03/2021 CLINICAL DATA:  Chest pain EXAM: CHEST - 2 VIEW COMPARISON:  07/04/2021 FINDINGS: Chronic elevation of the left diaphragm. Mild bilateral interstitial thickening. No focal consolidation. No pleural effusion or pneumothorax. Heart and mediastinal contours are unremarkable. Prior CABG. No acute osseous abnormality. IMPRESSION: No active cardiopulmonary disease. Electronically Signed   By: Kathreen Devoid M.D.   On: 10/03/2021 12:55     The results of significant diagnostics from this hospitalization (including imaging, microbiology, ancillary and laboratory) are listed below for reference.     Microbiology: Recent Results (from the past 240 hour(s))  C Difficile Quick Screen w PCR reflex     Status: None   Collection Time: 10/04/21  8:43 PM   Specimen: STOOL  Result Value Ref Range Status   C Diff antigen NEGATIVE NEGATIVE Final   C Diff toxin NEGATIVE NEGATIVE Final   C Diff interpretation No C. difficile detected.  Final    Comment: Performed at [facility], [address], [place], [place] [address]     Labs: BNP (last 3 results) No results for input(s): "BNP" in the last 8760 hours. Basic Metabolic Panel: Recent Labs  Lab 10/03/21 1321  NA 138  K 3.8  CL 102  CO2 29  GLUCOSE 99  BUN 10  CREATININE 0.66  CALCIUM 9.4   Liver Function Tests: Recent Labs  Lab 10/03/21 1321  AST 31  ALT 20  ALKPHOS 65  BILITOT 1.3*  PROT 6.7   ALBUMIN 3.7   No results for input(s): "LIPASE", "AMYLASE" in the last 168 hours. No results for input(s): "AMMONIA" in the last 168 hours. CBC: Recent Labs  Lab 10/03/21 1321  WBC 6.0  HGB 14.3  HCT 41.8  MCV 94.6  PLT 206   Cardiac Enzymes: No results for input(s): "CKTOTAL", "CKMB", "CKMBINDEX", "TROPONINI" in the last 168 hours. BNP: Invalid input(s): "POCBNP" CBG: No results for input(s): "GLUCAP" in the last 168 hours. D-Dimer No results for input(s): "DDIMER" in the last 72 hours. Hgb A1c No results for input(s): "HGBA1C" in the last 72 hours. Lipid Profile Recent Labs    10/04/21 1833  CHOL 178  HDL 68  LDLCALC 93  TRIG 87  CHOLHDL 2.6   Thyroid function studies No results for input(s): "TSH", "T4TOTAL", "T3FREE", "THYROIDAB" in the last 72 hours.  Invalid input(s): "FREET3" Anemia work up No results for input(s): "VITAMINB12", "FOLATE", "FERRITIN", "TIBC", "IRON", "RETICCTPCT" in the last 72 hours. Urinalysis    Component Value Date/Time   COLORURINE COLORLESS (A) 10/03/2021 1321   APPEARANCEUR CLEAR 10/03/2021 1321   LABSPEC 1.003 (L) 10/03/2021 1321   PHURINE 7.0 10/03/2021 1321   GLUCOSEU NEGATIVE 10/03/2021 1321   HGBUR NEGATIVE 10/03/2021 [place] 10/03/2021 1321   KETONESUR NEGATIVE 10/03/2021 1321   PROTEINUR NEGATIVE 10/03/2021 1321   NITRITE NEGATIVE 10/03/2021 1321   LEUKOCYTESUR NEGATIVE 10/03/2021 1321  Sepsis Labs Recent Labs  Lab 10/03/21 1321  WBC 6.0   Microbiology Recent Results (from the past 240 hour(s))  C Difficile Quick Screen w PCR reflex     Status: None   Collection Time: 10/04/21  8:43 PM   Specimen: STOOL  Result Value Ref Range Status   C Diff antigen NEGATIVE NEGATIVE Final   C Diff toxin NEGATIVE NEGATIVE Final   C Diff interpretation No C. difficile detected.  Final    Comment: Performed at [facility], [address], [place], [place] [address]     Time coordinating discharge:  I  have spent 35 minutes face to face with the patient and on the ward discussing the patients care, assessment, plan and disposition with other care givers. >50% of the time was devoted counseling the patient about the risks and benefits of treatment/Discharge disposition and coordinating care.   SIGNED:   Damita Lack, MD  [facility] 10/05/2021, 11:16 AM   If 7PM-7AM, please contact night-coverage

## 2021-10-05 NOTE — Progress Notes (Signed)
Progress Note  Patient Name: PANAGIOTA PERFETTI Date of Encounter: 10/05/2021  [facility] Cardiologist: Previously Dr. Purvis Sheffield --> Now Dr. Tenny Craw  Subjective   Describes recurrent sharp left-sided chest pain.  Reports positional.  Describes worse with arm movement and better with putting her arm at her side.  Tender to palpation over the left chest wall this morning.  Nuclear medicine stress test today.  Inpatient Medications    Scheduled Meds:  amLODipine  5 mg Oral Daily   aspirin EC  81 mg Oral Q breakfast   enoxaparin (LOVENOX) injection  40 mg Subcutaneous Q24H   furosemide  20 mg Oral Daily   hydrochlorothiazide  25 mg Oral Daily   HYDROcodone-acetaminophen  1 tablet Oral Once   isosorbide mononitrate  60 mg Oral Daily   rosuvastatin  5 mg Oral Daily   timolol  1 drop Right Eye BID   Continuous Infusions:  PRN Meds: ALPRAZolam, nitroGLYCERIN, ondansetron **OR** ondansetron (ZOFRAN) IV, polyethylene glycol, technetium tetrofosmin   Vital Signs    Vitals:   10/04/21 1237 10/04/21 1539 10/04/21 2041 10/05/21 0538  BP: 114/65 110/62 113/72 135/74  Pulse: 79 77 76 83  Resp: 16  19 19   Temp: 98.2 F (36.8 C)  97.9 F (36.6 C) 98 F (36.7 C)  TempSrc: Oral  Oral   SpO2: 99% 95% 97% 97%  Weight:      Height:        Intake/Output Summary (Last 24 hours) at 10/05/2021 0735 Last data filed at 10/04/2021 1841 Gross per 24 hour  Intake 360 ml  Output --  Net 360 ml      10/03/2021    7:21 PM 10/03/2021    1:23 PM 10/03/2021   12:31 PM  Last 3 Weights  Weight (lbs) 158 lb 11.7 oz 160 lb 160 lb  Weight (kg) 72 kg 72.576 kg 72.576 kg      Telemetry    Sinus rhythm in the 60s- Personally Reviewed  ECG    NSR, HR 62 with nonspecific ST changes along inferior leads.  - Personally Reviewed  Physical Exam   GEN: No acute distress.   Neck: No JVD Cardiac: RRR, no murmurs, rubs, or gallops.  Respiratory: Clear to auscultation bilaterally. GI: Soft,  nontender, non-distended  MS: No edema; No deformity, tender to palpation over the left chest wall Neuro:  Nonfocal  Psych: Normal affect   Labs    High Sensitivity Troponin:   Recent Labs  Lab 10/03/21 1321 10/03/21 1434  TROPONINIHS 5 4     Chemistry Recent Labs  Lab 10/03/21 1321  NA 138  K 3.8  CL 102  CO2 29  GLUCOSE 99  BUN 10  CREATININE 0.66  CALCIUM 9.4  PROT 6.7  ALBUMIN 3.7  AST 31  ALT 20  ALKPHOS 65  BILITOT 1.3*  GFRNONAA >60  ANIONGAP 7    Lipids  Recent Labs  Lab 10/04/21 1833  CHOL 178  TRIG 87  HDL 68  LDLCALC 93  CHOLHDL 2.6    Hematology Recent Labs  Lab 10/03/21 1321  WBC 6.0  RBC 4.42  HGB 14.3  HCT 41.8  MCV 94.6  MCH 32.4  MCHC 34.2  RDW 12.2  PLT 206   Thyroid No results for input(s): "TSH", "FREET4" in the last 168 hours.  BNPNo results for input(s): "BNP", "PROBNP" in the last 168 hours.  DDimer No results for input(s): "DDIMER" in the last 168 hours.   Radiology  DG Chest 2 View  Result Date: 10/03/2021 CLINICAL DATA:  Chest pain EXAM: CHEST - 2 VIEW COMPARISON:  07/04/2021 FINDINGS: Chronic elevation of the left diaphragm. Mild bilateral interstitial thickening. No focal consolidation. No pleural effusion or pneumothorax. Heart and mediastinal contours are unremarkable. Prior CABG. No acute osseous abnormality. IMPRESSION: No active cardiopulmonary disease. Electronically Signed   By: Elige Ko M.D.   On: 10/03/2021 12:55    Cardiac Studies   LHC 06/07/2016 1. Severe three-vessel and distal left main disease. 2. Moderately elevated left ventricular end-diastolic pressure. 3. Left ventricular angiography was not performed. EF was 50-55% by echo.  Echocardiogram: 09/2021 IMPRESSIONS   1. DIfficult acoustic windows No definite wall motion abnormalities .  Left ventricular ejection fraction, by estimation, is 60 to 65%. The left  ventricle has normal function. The left ventricle has no regional wall  motion  abnormalities. Left ventricular   diastolic parameters are indeterminate.   2. Right ventricular systolic function is normal. The right ventricular  size is normal.   3. Trivial mitral valve regurgitation.   4. The aortic valve was not well visualized. Aortic valve regurgitation  is not visualized. Aortic valve sclerosis is present, with no evidence of  aortic valve stenosis.   5. The inferior vena cava is normal in size with greater than 50%  respiratory variability, suggesting right atrial pressure of 3 mmHg.   Patient Profile     81 y.o. female w/ PMH of CAD (s/p CABG in 05/2016 with LIMA-LAD, SVG-D2, SVG-OM, SVG-RCA), HTN, HLD, carotid artery stenosis (s/p L CEA in 05/2016) and prior TIA's who is currently admitted for evaluation of chest pain.   Assessment & Plan    #Chest pain, possible cardiac -Reports sharp chest pain.  Can occur with movement.  Can occur at rest.  Exquisitely tender over the left chest wall. -EKG with no acute ischemic changes. -Echo with no regional wall motion abnormalities. -High-sensitivity troponins are negative. -I am not entirely convinced this represents cardiac chest pain.  She will undergo nuclear medicine stress test today.  She is tender over the left chest wall.  Suspect symptoms could be musculoskeletal if not cardiac related.  Would trial a course of Tylenol to see if this helps.  Also could try lidocaine patch.  Further recommendations pending stress test.  #CAD status post CABG -Stress test as well.  Continue aspirin, Crestor, Imdur.  She is also on amlodipine 5 mg daily.  Euvolemic on exam.  #HTN -Stop HCTZ.  She is on Lasix. -Continue amlodipine 2.5 mg daily.  On Imdur 60 mg daily. -If she needs another diuretic would recommend Aldactone.  Would not give her both HCTZ and Lasix.  #HLD -Add Zetia 10 mg daily.  Continue low-dose Crestor.  Has issues with higher doses of statins.  #Carotid artery stenosis -Status post left CEA.  On aspirin  and hyperlipidemia treatment as above  For questions or updates, please contact [facility] Please consult [email or web address] for contact info under   [employer]. Flora Lipps, MD, [facility]  [facility]  [address], [address] [place], [place] [address] [phone]  8:33 AM

## 2021-10-05 NOTE — Progress Notes (Signed)
    Stress test showed no evidence of ischemia and was a low-risk study. Reviewed with the patient and admitting team made aware. No plans for further cardiac testing. Was recommended to proceed with a Lidocaine patch to see if this helps with her symptoms. Will arrange for Cardiology follow-up in 6-8 weeks.   Signed, Ellsworth Lennox, PA-C 10/05/2021, 11:21 AM

## 2021-10-11 DIAGNOSIS — Z6834 Body mass index (BMI) 34.0-34.9, adult: Secondary | ICD-10-CM | POA: Diagnosis not present

## 2021-10-11 DIAGNOSIS — R946 Abnormal results of thyroid function studies: Secondary | ICD-10-CM | POA: Diagnosis not present

## 2021-10-11 DIAGNOSIS — Z1331 Encounter for screening for depression: Secondary | ICD-10-CM | POA: Diagnosis not present

## 2021-10-11 DIAGNOSIS — I5033 Acute on chronic diastolic (congestive) heart failure: Secondary | ICD-10-CM | POA: Diagnosis not present

## 2021-10-11 DIAGNOSIS — Z0001 Encounter for general adult medical examination with abnormal findings: Secondary | ICD-10-CM | POA: Diagnosis not present

## 2021-10-11 DIAGNOSIS — I209 Angina pectoris, unspecified: Secondary | ICD-10-CM | POA: Diagnosis not present

## 2021-10-11 DIAGNOSIS — R7309 Other abnormal glucose: Secondary | ICD-10-CM | POA: Diagnosis not present

## 2021-11-01 DIAGNOSIS — R5381 Other malaise: Secondary | ICD-10-CM | POA: Diagnosis not present

## 2021-11-04 DIAGNOSIS — I5030 Unspecified diastolic (congestive) heart failure: Secondary | ICD-10-CM | POA: Diagnosis not present

## 2021-11-04 DIAGNOSIS — I110 Hypertensive heart disease with heart failure: Secondary | ICD-10-CM | POA: Diagnosis not present

## 2021-11-04 DIAGNOSIS — E119 Type 2 diabetes mellitus without complications: Secondary | ICD-10-CM | POA: Diagnosis not present

## 2021-11-07 DIAGNOSIS — H401111 Primary open-angle glaucoma, right eye, mild stage: Secondary | ICD-10-CM | POA: Diagnosis not present

## 2021-11-10 NOTE — Progress Notes (Deleted)
Cardiology Office Note    Date:  11/10/2021   ID:  Sheryl Carter, Sheryl Carter 1940-05-22, MRN [id number]  PCP:  Elfredia Nevins, MD  Cardiologist: Dietrich Pates, MD    No chief complaint on file.   History of Present Illness:    Sheryl Carter is a 81 y.o. female with past medical history of CAD (s/p CABG in 05/2016 with LIMA-LAD, SVG-D2, SVG-OM, SVG-RCA), HTN, HLD, carotid artery stenosis (s/p L CEA in 05/2016) and prior TIA's who presents to the office today for hospital follow-up.  She was most recently admitted to [facility] in 09/2021 for evaluation of chest pain which was overall felt to be atypical as it was worse with movement of her left arm and she was tender to palpation along her chest wall. Hs troponin values were negative and EKG was without acute ST changes. Echocardiogram showed a preserved EF of 60 to 65% with no wall motion abnormalities and no significant valve abnormalities. She did undergo a Lexiscan Myoview which showed no evidence of ischemia. She did have diaphragmatic attenuation and her TID ratio was borderline but visually there was no evidence of TID and it was overall felt to be a low-risk study. It was recommended that a Lidocaine patch be applied to the area and no further cardiac testing was pursued. She was continued on Crestor and Zetia 10 mg daily was added to her medication regimen. She had been on both HCTZ and Lasix prior to admission, therefore HCTZ was discontinued.      Past Medical History:  Diagnosis Date   Anxiety    Blindness of left eye    CAD (coronary artery disease)    a. s/p CABG in 05/2016 with LIMA-LAD, SVG-D2, SVG-OM, SVG-RCA   Complication of anesthesia    has gas trapped in her body after   DVT (deep venous thrombosis) (HCC)    Ear problems    Family history of adverse reaction to anesthesia    daughter has gas trapped in her body postop as well   Glaucoma    Hypercholesterolemia    Hypertension    Ischemic cardiomyopathy     a. 05/2016 Echo: EF 50-55%, inf HK.   Pre-diabetes    pt. told by [facility] staff that she is prediabetic, HgbA1c was 5.4   Stroke (HCC)    having TIA's    Past Surgical History:  Procedure Laterality Date   BREAST SURGERY Left    cyst- L breast   CHOLECYSTECTOMY     CORONARY ARTERY BYPASS GRAFT N/A 06/09/2016   Procedure: CORONARY ARTERY BYPASS GRAFTING (CABG) times 4 using the left internal mammary artery and bilateral thigh greater saphenous veins harvested endoscopically. LIMA to LAD, SVG to DIAGONAL 2, SVG to OM, SVG to RCA;  Surgeon: Delight Ovens, MD;  Location: [facility] OR;  Service: Open Heart Surgery;  Laterality: N/A;   ENDARTERECTOMY Left 05/29/2016   Procedure: LEFT CAROTID ENDARTERECTOMY WITH PATCH ANGIOPLASTY;  Surgeon: Larina Earthly, MD;  Location: [facility] OR;  Service: Vascular;  Laterality: Left;   EYE SURGERY Right    cataract   LEFT HEART CATH AND CORONARY ANGIOGRAPHY N/A 06/07/2016   Procedure: Left Heart Cath and Coronary Angiography;  Surgeon: Iran Ouch, MD;  Location: [facility] INVASIVE CV LAB;  Service: Cardiovascular;  Laterality: N/A;   TEE WITHOUT CARDIOVERSION N/A 06/09/2016   Procedure: TRANSESOPHAGEAL ECHOCARDIOGRAM (TEE);  Surgeon: Delight Ovens, MD;  Location: [facility] OR;  Service: Open Heart Surgery;  Laterality: N/A;  Current Medications: Outpatient Medications Prior to Visit  Medication Sig Dispense Refill   ALPRAZolam (XANAX) 0.25 MG tablet Take 0.25 mg by mouth 2 (two) times daily. *May take one additional dose at lunch as needed     amLODipine (NORVASC) 5 MG tablet Take 1 tablet (5 mg total) by mouth daily. 90 tablet 3   antiseptic oral rinse (BIOTENE) LIQD 15 mLs by Mouth Rinse route as needed for dry mouth.     aspirin EC 81 MG tablet Take 1 tablet (81 mg total) by mouth daily with breakfast. 120 tablet 2   ezetimibe (ZETIA) 10 MG tablet Take 1 tablet (10 mg total) by mouth daily. 30 tablet 3   fexofenadine (ALLEGRA) 180 MG tablet Take 180 mg by mouth daily.      furosemide (LASIX) 20 MG tablet Take 1 tablet (20 mg total) by mouth daily. 20 tablet 0   isosorbide mononitrate (IMDUR) 60 MG 24 hr tablet Take 1 tablet (60 mg total) by mouth daily. 30 tablet 0   lidocaine (LIDODERM) 5 % Place 1 patch onto the skin daily. Remove & Discard patch within 12 hours or as directed by MD 30 patch 0   nitroGLYCERIN (NITROSTAT) 0.4 MG SL tablet DISSOLVE (1) TABLET UNDER THE TONGUE EVERY 5 MINUTES AS NEEDED FOR CHEST PAIN. (Patient taking differently: Place 0.4 mg under the tongue every 5 (five) minutes as needed.) 25 tablet 3   Omega-3 Fatty Acids (FISH OIL) 1000 MG CAPS Take 1,000 mg by mouth daily.     potassium chloride 20 MEQ TBCR Take 20 mEq by mouth daily. (Patient not taking: Reported on 10/03/2021) 5 tablet 0   rosuvastatin (CRESTOR) 5 MG tablet TAKE 1 TABLET BY MOUTH ONCE A DAY. (Patient taking differently: Take 5 mg by mouth daily.) 90 tablet 2   timolol (BETIMOL) 0.5 % ophthalmic solution Place 1 drop into the right eye 2 (two) times daily.      vitamin B-12 (CYANOCOBALAMIN) 1000 MCG tablet Take 1,000 mcg by mouth daily.     No facility-administered medications prior to visit.     Allergies:   Other, Prednisone, Statins, Erythromycin, and Tylenol [acetaminophen]   Social History   Socioeconomic History   Marital status: Divorced    Spouse name: Not on file   Number of children: 4   Years of education: 10   Highest education level: Not on file  Occupational History    Comment: retired  Tobacco Use   Smoking status: Never   Smokeless tobacco: Never  Vaping Use   Vaping Use: Never used  Substance and Sexual Activity   Alcohol use: No   Drug use: No   Sexual activity: Not Currently  Other Topics Concern   Not on file  Social History Narrative   Lives with daughter   No caffeine since Dec 2017   Social Determinants of Health   Financial Resource Strain: Not on file  Food Insecurity: Not on file  Transportation Needs: Not on file  Physical  Activity: Not on file  Stress: Not on file  Social Connections: Not on file     Family History:  The patient's ***family history includes Heart disease in her sister and son.   Review of Systems:    Please see the history of present illness.     All other systems reviewed and are otherwise negative except as noted above.   Physical Exam:    VS:  There were no vitals taken for this visit.  General: Well developed, well nourished,female appearing in no acute distress. Head: Normocephalic, atraumatic. Neck: No carotid bruits. JVD not elevated.  Lungs: Respirations regular and unlabored, without wheezes or rales.  Heart: ***Regular rate and rhythm. No S3 or S4.  No murmur, no rubs, or gallops appreciated. Abdomen: Appears non-distended. No obvious abdominal masses. Msk:  Strength and tone appear normal for age. No obvious joint deformities or effusions. Extremities: No clubbing or cyanosis. No edema.  Distal pedal pulses are 2+ bilaterally. Neuro: Alert and oriented X 3. Moves all extremities spontaneously. No focal deficits noted. Psych:  Responds to questions appropriately with a normal affect. Skin: No rashes or lesions noted  Wt Readings from Last 3 Encounters:  10/03/21 158 lb 11.7 oz (72 kg)  07/04/21 155 lb (70.3 kg)  01/31/21 180 lb (81.6 kg)        Studies/Labs Reviewed:   EKG:  EKG is*** ordered today.  The ekg ordered today demonstrates ***  Recent Labs: 10/03/2021: ALT 20; BUN 10; Creatinine, Ser 0.66; Hemoglobin 14.3; Platelets 206; Potassium 3.8; Sodium 138   Lipid Panel    Component Value Date/Time   CHOL 178 10/04/2021 1833   CHOL 157 12/29/2016 0955   TRIG 87 10/04/2021 1833   HDL 68 10/04/2021 1833   HDL 51 12/29/2016 0955   CHOLHDL 2.6 10/04/2021 1833   VLDL 17 10/04/2021 1833   LDLCALC 93 10/04/2021 1833   LDLCALC 87 12/29/2016 0955    Additional studies/ records that were reviewed today include:   Echocardiogram: 10/04/2021 IMPRESSIONS      1. DIfficult acoustic windows No definite wall motion abnormalities .  Left ventricular ejection fraction, by estimation, is 60 to 65%. The left  ventricle has normal function. The left ventricle has no regional wall  motion abnormalities. Left ventricular   diastolic parameters are indeterminate.   2. Right ventricular systolic function is normal. The right ventricular  size is normal.   3. Trivial mitral valve regurgitation.   4. The aortic valve was not well visualized. Aortic valve regurgitation  is not visualized. Aortic valve sclerosis is present, with no evidence of  aortic valve stenosis.   5. The inferior vena cava is normal in size with greater than 50%  respiratory variability, suggesting right atrial pressure of 3 mmHg.   NST: 10/05/2021   LV perfusion is normal. There is no evidence of ischemia. There is no evidence of infarction. Diaphragm attenuation noted. TID ratio borderline for pharmacologic stress (1.3) but visually there is no TID. Average quality study likely explains borderline numerical value. Overall, perfusion is normal, no TID, normal study.   Left ventricular function is normal. Nuclear stress EF: 62 %. The left ventricular ejection fraction is normal (55-65%). End diastolic cavity size is normal.   The study is normal. The study is low risk.  Assessment:    No diagnosis found.   Plan:   In order of problems listed above:  1. CAD - She is s/p CABG in 05/2016 with LIMA-LAD, SVG-D2, SVG-OM, SVG-RCA and recent NST in 09/2021 was low-risk and showed no evidence of ischemia. ***  2. HTN - ***  3. HLD - FLP during recent admission showed total cholesterol 178, triglycerides 87, HDL 68 and LDL 93. She was continued on Crestor 5 mg daily and given her intolerance of higher intensity statins in the past, Zetia 10 mg daily was added to her medication regimen.  4. Carotid Artery Stenosis - She is s/p L CEA in 05/2016 and carotid  Dopplers in 01/2021 showed  minor atherosclerosis with no evidence of stenosis.   Shared Decision Making/Informed Consent:   {Are you ordering a CV Procedure (e.g. stress test, cath, DCCV, TEE, etc)?   Press F2        :[id number]}    Medication Adjustments/Labs and Tests Ordered: Current medicines are reviewed at length with the patient today.  Concerns regarding medicines are outlined above.  Medication changes, Labs and Tests ordered today are listed in the Patient Instructions below. There are no Patient Instructions on file for this visit.   Signed, Ellsworth Lennox, PA-C  11/10/2021 11:06 AM    [facility] [address] [place], [place] [address] Phone: [phone] Fax: [phone]

## 2021-11-11 ENCOUNTER — Encounter (HOSPITAL_COMMUNITY): Payer: Self-pay | Admitting: Emergency Medicine

## 2021-11-11 ENCOUNTER — Emergency Department (HOSPITAL_COMMUNITY)
Admission: EM | Admit: 2021-11-11 | Discharge: 2021-11-11 | Disposition: A | Discharge status: Home / Self Care | Payer: Medicare HMO | Attending: Emergency Medicine | Admitting: Emergency Medicine

## 2021-11-11 ENCOUNTER — Other Ambulatory Visit: Payer: Self-pay

## 2021-11-11 ENCOUNTER — Ambulatory Visit: Payer: Medicare HMO | Admitting: Student

## 2021-11-11 ENCOUNTER — Emergency Department (HOSPITAL_COMMUNITY): Payer: Medicare HMO

## 2021-11-11 DIAGNOSIS — Z79899 Other long term (current) drug therapy: Secondary | ICD-10-CM | POA: Insufficient documentation

## 2021-11-11 DIAGNOSIS — I10 Essential (primary) hypertension: Secondary | ICD-10-CM | POA: Diagnosis not present

## 2021-11-11 DIAGNOSIS — R0902 Hypoxemia: Secondary | ICD-10-CM | POA: Diagnosis not present

## 2021-11-11 DIAGNOSIS — R202 Paresthesia of skin: Secondary | ICD-10-CM | POA: Diagnosis not present

## 2021-11-11 DIAGNOSIS — R42 Dizziness and giddiness: Secondary | ICD-10-CM | POA: Diagnosis not present

## 2021-11-11 DIAGNOSIS — R069 Unspecified abnormalities of breathing: Secondary | ICD-10-CM | POA: Diagnosis not present

## 2021-11-11 DIAGNOSIS — R519 Headache, unspecified: Secondary | ICD-10-CM | POA: Diagnosis not present

## 2021-11-11 DIAGNOSIS — M542 Cervicalgia: Secondary | ICD-10-CM | POA: Diagnosis not present

## 2021-11-11 DIAGNOSIS — Z7982 Long term (current) use of aspirin: Secondary | ICD-10-CM | POA: Diagnosis not present

## 2021-11-11 LAB — BASIC METABOLIC PANEL
Anion gap: 9 (ref 5–15)
BUN: 9 mg/dL (ref 8–23)
CO2: 28 mmol/L (ref 22–32)
Calcium: 9.4 mg/dL (ref 8.9–10.3)
Chloride: 102 mmol/L (ref 98–111)
Creatinine, Ser: 0.6 mg/dL (ref 0.44–1.00)
GFR, Estimated: >60 mL/min (ref >60)
Glucose, Bld: 111 mg/dL — ABNORMAL HIGH (ref 70–99)
Potassium: 3.4 mmol/L — ABNORMAL LOW (ref 3.5–5.1)
Sodium: 139 mmol/L (ref 135–145)

## 2021-11-11 LAB — URINALYSIS, ROUTINE W REFLEX MICROSCOPIC
Bilirubin Urine: NEGATIVE
Glucose, UA: NEGATIVE mg/dL
Hgb urine dipstick: NEGATIVE
Ketones, ur: NEGATIVE mg/dL
Leukocytes,Ua: NEGATIVE
Nitrite: NEGATIVE
Protein, ur: NEGATIVE mg/dL
Specific Gravity, Urine: 1.004 — ABNORMAL LOW (ref 1.005–1.030)
pH: 6 (ref 5.0–8.0)

## 2021-11-11 LAB — CBC
HCT: 44.2 % (ref 36.0–46.0)
Hemoglobin: 15.5 g/dL — ABNORMAL HIGH (ref 12.0–15.0)
MCH: 32.6 pg (ref 26.0–34.0)
MCHC: 35.1 g/dL (ref 30.0–36.0)
MCV: 92.9 fL (ref 80.0–100.0)
Platelets: 217 10*3/uL (ref 150–400)
RBC: 4.76 MIL/uL (ref 3.87–5.11)
RDW: 11.9 % (ref 11.5–15.5)
WBC: 6.1 10*3/uL (ref 4.0–10.5)
nRBC: 0 % (ref 0.0–0.2)

## 2021-11-11 LAB — CBG MONITORING, ED: Glucose-Capillary: 110 mg/dL — ABNORMAL HIGH (ref 70–99)

## 2021-11-11 MED ORDER — METOCLOPRAMIDE HCL 5 MG/ML IJ SOLN
5.0000 mg | Freq: Once | INTRAMUSCULAR | Status: AC
  Administered 2021-11-11: 5 mg via INTRAVENOUS
  Filled 2021-11-11: qty 2

## 2021-11-11 MED ORDER — DIPHENHYDRAMINE HCL 50 MG/ML IJ SOLN
12.5000 mg | Freq: Once | INTRAMUSCULAR | Status: DC
  Filled 2021-11-11: qty 1

## 2021-11-11 MED ORDER — KETOROLAC TROMETHAMINE 30 MG/ML IJ SOLN
15.0000 mg | Freq: Once | INTRAMUSCULAR | Status: AC
Start: 2021-11-11 — End: 2021-11-11
  Administered 2021-11-11: 15 mg via INTRAVENOUS
  Filled 2021-11-11: qty 1

## 2021-11-11 MED ORDER — MECLIZINE HCL 25 MG PO TABS: ORAL_TABLET | ORAL | 0 refills | Status: DC

## 2021-11-11 MED ORDER — SODIUM CHLORIDE 0.9 % IV BOLUS
500.0000 mL | Freq: Once | INTRAVENOUS | Status: AC
  Administered 2021-11-11: 500 mL via INTRAVENOUS

## 2021-11-11 NOTE — ED Provider Notes (Signed)
[facility] EMERGENCY DEPARTMENT Provider Note   CSN: [id number] Arrival date & time: 11/11/21  1333     History {Add pertinent medical, surgical, social history, OB history to HPI:1} Chief Complaint  Patient presents with   Headache    Sheryl Carter is a 80 y.o. female.  Patient with a history of hypertension, vertigo, headache.  Comes in complaining of a headache with mild vertigo symptoms   Headache      Home Medications Prior to Admission medications   Medication Sig Start Date End Date Taking? Authorizing Provider  ALPRAZolam (XANAX) 0.25 MG tablet Take 0.25 mg by mouth 2 (two) times daily. *May take one additional dose at lunch as needed   Yes [provider]  amLODipine (NORVASC) 5 MG tablet Take 1 tablet (5 mg total) by mouth daily. 12/31/19 11/11/21 Yes Strader, Lennart Pall, PA-C  antiseptic oral rinse (BIOTENE) LIQD 15 mLs by Mouth Rinse route as needed for dry mouth.   Yes [provider]  lidocaine (LIDODERM) 5 % Place 1 patch onto the skin daily. Remove & Discard patch within 12 hours or as directed by MD 10/06/21  Yes Dimple Nanas, MD  meclizine (ANTIVERT) 25 MG tablet Take 1 every 8-12 hours if you have spinning dizziness 11/11/21  Yes Bethann Berkshire, MD  nitroGLYCERIN (NITROSTAT) 0.4 MG SL tablet DISSOLVE (1) TABLET UNDER THE TONGUE EVERY 5 MINUTES AS NEEDED FOR CHEST PAIN. Patient taking differently: Place 0.4 mg under the tongue every 5 (five) minutes as needed. 02/07/21  Yes Antoine Poche, MD  aspirin EC 81 MG tablet Take 1 tablet (81 mg total) by mouth daily with breakfast. 03/18/19   Shon Hale, MD  ezetimibe (ZETIA) 10 MG tablet Take 1 tablet (10 mg total) by mouth daily. 10/06/21   Amin, Loura Halt, MD  fexofenadine (ALLEGRA) 180 MG tablet Take 180 mg by mouth daily.    [provider]  furosemide (LASIX) 20 MG tablet Take 1 tablet (20 mg total) by mouth daily. 01/31/21   Blue, Soijett A, PA-C  isosorbide  mononitrate (IMDUR) 60 MG 24 hr tablet Take 1 tablet (60 mg total) by mouth daily. 10/06/21   Amin, Loura Halt, MD  Omega-3 Fatty Acids (FISH OIL) 1000 MG CAPS Take 1,000 mg by mouth daily.    [provider]  potassium chloride 20 MEQ TBCR Take 20 mEq by mouth daily. Patient not taking: Reported on 10/03/2021 07/04/21   Burgess Amor, PA-C  rosuvastatin (CRESTOR) 5 MG tablet TAKE 1 TABLET BY MOUTH ONCE A DAY. Patient taking differently: Take 5 mg by mouth daily. 03/07/21   Strader, Lennart Pall, PA-C  timolol (BETIMOL) 0.5 % ophthalmic solution Place 1 drop into the right eye 2 (two) times daily.     [provider]  vitamin B-12 (CYANOCOBALAMIN) 1000 MCG tablet Take 1,000 mcg by mouth daily.    [provider]      Allergies    Other, Prednisone, Statins, Benadryl [diphenhydramine], Erythromycin, and Tylenol [acetaminophen]    Review of Systems   Review of Systems  Neurological:  Positive for headaches.    Physical Exam Updated Vital Signs BP (!) 144/81   Pulse (!) 55   Temp (!) 97.5 F (36.4 C) (Oral)   Resp 17   Ht 4\' 7"  (1.397 m)   Wt 72 kg   SpO2 98%   BMI 36.89 kg/m  Physical Exam  ED Results / Procedures / Treatments   Labs (all labs ordered are  listed, but only abnormal results are displayed) Labs Reviewed  BASIC METABOLIC PANEL - Abnormal; Notable for the following components:      Result Value   Potassium 3.4 (*)    Glucose, Bld 111 (*)    All other components within normal limits  CBC - Abnormal; Notable for the following components:   Hemoglobin 15.5 (*)    All other components within normal limits  URINALYSIS, ROUTINE W REFLEX MICROSCOPIC - Abnormal; Notable for the following components:   Color, Urine STRAW (*)    Specific Gravity, Urine 1.004 (*)    All other components within normal limits  CBG MONITORING, ED - Abnormal; Notable for the following components:   Glucose-Capillary 110 (*)    All other components within normal  limits    EKG None  Radiology CT Head Wo Contrast  Result Date: 11/11/2021 CLINICAL DATA:  Dizziness and right-sided facial tingling. EXAM: CT HEAD WITHOUT CONTRAST TECHNIQUE: Contiguous axial images were obtained from the base of the skull through the vertex without intravenous contrast. RADIATION DOSE REDUCTION: This exam was performed according to the departmental dose-optimization program which includes automated exposure control, adjustment of the mA and/or kV according to patient size and/or use of iterative reconstruction technique. COMPARISON:  July 04, 2021 FINDINGS: Brain: There is mild cerebral atrophy with widening of the extra-axial spaces and ventricular dilatation. There are areas of decreased attenuation within the white matter tracts of the supratentorial brain, consistent with microvascular disease changes. Vascular: No hyperdense vessel or unexpected calcification. Skull: Normal. Negative for fracture or focal lesion. Sinuses/Orbits: No acute finding. Other: None. IMPRESSION: 1. No acute intracranial abnormality. 2. Generalized cerebral atrophy with chronic white matter small vessel ischemic changes. Electronically Signed   By: Aram Candela M.D.   On: 11/11/2021 18:53    Procedures Procedures  {Document cardiac monitor, telemetry assessment procedure when appropriate:1}  Medications Ordered in ED Medications  diphenhydrAMINE (BENADRYL) injection 12.5 mg (12.5 mg Intravenous Patient Refused/Not Given 11/11/21 1602)  ketorolac (TORADOL) 30 MG/ML injection 15 mg (15 mg Intravenous Given 11/11/21 1604)  metoCLOPramide (REGLAN) injection 5 mg (5 mg Intravenous Given 11/11/21 1604)  sodium chloride 0.9 % bolus 500 mL (500 mLs Intravenous New Bag/Given 11/11/21 1711)    ED Course/ Medical Decision Making/ A&P                           Medical Decision Making Amount and/or Complexity of Data Reviewed Labs: ordered. Radiology: ordered.  Risk Prescription drug  management.   Labs and CT scan unremarkable.  Patient improved with migraine cocktail.  She will follow-up with her PCP and is given Antivert if the dizziness returns  {Document critical care time when appropriate:1} {Document review of labs and clinical decision tools ie heart score, Chads2Vasc2 etc:1}  {Document your independent review of radiology images, and any outside records:1} {Document your discussion with family members, caretakers, and with consultants:1} {Document social determinants of health affecting pt's care:1} {Document your decision making why or why not admission, treatments were needed:1} Final Clinical Impression(s) / ED Diagnoses Final diagnoses:  Bad headache    Rx / DC Orders ED Discharge Orders          Ordered    meclizine (ANTIVERT) 25 MG tablet        11/11/21 1905

## 2021-11-11 NOTE — ED Triage Notes (Signed)
Pt brought in from home with c/o tingling to right side of face and headache since last night. EMS reports pt has been having same symptoms for 2 weeks and has been seeing her PCP for it. Stroke screen negative for EMS.

## 2021-11-11 NOTE — ED Notes (Signed)
Patient transported to CT 

## 2021-11-11 NOTE — Discharge Instructions (Signed)
Use a walker to ambulate with.  Follow-up with your family doctor next week.  We will write for some medicine if you have spinning dizziness again.

## 2021-11-24 DIAGNOSIS — I2510 Atherosclerotic heart disease of native coronary artery without angina pectoris: Secondary | ICD-10-CM | POA: Diagnosis not present

## 2021-11-24 DIAGNOSIS — I10 Essential (primary) hypertension: Secondary | ICD-10-CM | POA: Diagnosis not present

## 2021-11-30 ENCOUNTER — Other Ambulatory Visit: Payer: Self-pay | Admitting: Student

## 2021-11-30 DIAGNOSIS — H18422 Band keratopathy, left eye: Secondary | ICD-10-CM | POA: Diagnosis not present

## 2021-11-30 DIAGNOSIS — Z961 Presence of intraocular lens: Secondary | ICD-10-CM | POA: Diagnosis not present

## 2021-11-30 DIAGNOSIS — H44512 Absolute glaucoma, left eye: Secondary | ICD-10-CM | POA: Diagnosis not present

## 2021-12-05 ENCOUNTER — Other Ambulatory Visit: Payer: Self-pay | Admitting: Student

## 2021-12-13 DIAGNOSIS — H8110 Benign paroxysmal vertigo, unspecified ear: Secondary | ICD-10-CM | POA: Diagnosis not present

## 2021-12-13 DIAGNOSIS — K219 Gastro-esophageal reflux disease without esophagitis: Secondary | ICD-10-CM | POA: Diagnosis not present

## 2021-12-13 DIAGNOSIS — E6609 Other obesity due to excess calories: Secondary | ICD-10-CM | POA: Diagnosis not present

## 2021-12-13 DIAGNOSIS — I10 Essential (primary) hypertension: Secondary | ICD-10-CM | POA: Diagnosis not present

## 2021-12-13 DIAGNOSIS — Z6834 Body mass index (BMI) 34.0-34.9, adult: Secondary | ICD-10-CM | POA: Diagnosis not present

## 2021-12-21 DIAGNOSIS — I10 Essential (primary) hypertension: Secondary | ICD-10-CM | POA: Diagnosis not present

## 2021-12-24 DIAGNOSIS — I10 Essential (primary) hypertension: Secondary | ICD-10-CM | POA: Diagnosis not present

## 2021-12-24 DIAGNOSIS — I2510 Atherosclerotic heart disease of native coronary artery without angina pectoris: Secondary | ICD-10-CM | POA: Diagnosis not present

## 2022-01-30 ENCOUNTER — Ambulatory Visit: Payer: Medicare HMO | Admitting: Cardiovascular Disease

## 2022-02-08 ENCOUNTER — Encounter: Payer: Self-pay | Admitting: Internal Medicine

## 2022-02-08 ENCOUNTER — Ambulatory Visit: Payer: Medicare HMO | Attending: Cardiovascular Disease | Admitting: Internal Medicine

## 2022-02-08 VITALS — BP 140/80 | HR 61 | Ht <= 58 in | Wt 159.0 lb

## 2022-02-08 DIAGNOSIS — Z961 Presence of intraocular lens: Secondary | ICD-10-CM | POA: Diagnosis not present

## 2022-02-08 DIAGNOSIS — I25810 Atherosclerosis of coronary artery bypass graft(s) without angina pectoris: Secondary | ICD-10-CM

## 2022-02-08 DIAGNOSIS — H44512 Absolute glaucoma, left eye: Secondary | ICD-10-CM | POA: Diagnosis not present

## 2022-02-08 DIAGNOSIS — H18422 Band keratopathy, left eye: Secondary | ICD-10-CM | POA: Diagnosis not present

## 2022-02-08 MED ORDER — EZETIMIBE 10 MG PO TABS: 10.0000 mg | ORAL_TABLET | Freq: Every day | ORAL | 3 refills | Status: DC

## 2022-02-08 NOTE — Patient Instructions (Addendum)
Medication Instructions:  Your physician has recommended you make the following change in your medication:  Restart zetia 10 mg daily Continue other medications the same  Labwork: none  Testing/Procedures: none  Follow-Up: Your physician recommends that you schedule a follow-up appointment in: 1 year. You will receive a reminder call in the mail in about 10 months reminding you to call and schedule your appointment. If you don't receive this call, please contact our office.  Any Other Special Instructions Will Be Listed Below (If Applicable).  If you need a refill on your cardiac medications before your next appointment, please call your pharmacy.

## 2022-02-08 NOTE — Progress Notes (Addendum)
Cardiology Office Note  Date: 02/08/2022   ID: Sheryl Carter, DOB 1940/08/10, MRN [id number]  PCP:  Sheryl Nevins, MD  Cardiologist:  Dietrich Pates, MD Electrophysiologist:  None   Reason for Office Visit: Follow-up of CAD   History of Present Illness: Sheryl Carter is a 81 y.o. female known to have CAD status post CABG in 2018 (LIMA to LAD, SVG to D2, SVG to OM and SVG to RCA), CAS s/p L CEA, HTN, HLD presented to cardiology clinic for follow-up visit. Patient was previously admitted in 7/23 at [facility] with chief complaint of chest pain and shortness of breath. She underwent nuclear stress test which showed no evidence of ischemia, low risk study. She is here for follow-up visit.  She reported having pain in her chest when she moves her left shoulder and left arm but denied any chest pain during exertional activities.  No DOE, palpitations, syncope.  She takes medications at 9 AM in the morning and 9 PM in the evening.  When she takes all her medications in the morning, she feels dizzy and so had to sit on the couch for 30 minutes before she resumed any of her daily activities.  She is currently taking Imdur 30 mg once daily, rosuvastatin 5 mg nightly and not on Zetia. Patient did not tolerate higher doses of statins due to myalgias.    Past Medical History:  Diagnosis Date   Anxiety    Blindness of left eye    CAD (coronary artery disease)    a. s/p CABG in 05/2016 with LIMA-LAD, SVG-D2, SVG-OM, SVG-RCA   Complication of anesthesia    has gas trapped in her body after   DVT (deep venous thrombosis) (HCC)    Ear problems    Family history of adverse reaction to anesthesia    daughter has gas trapped in her body postop as well   Glaucoma    Hypercholesterolemia    Hypertension    Ischemic cardiomyopathy    a. 05/2016 Echo: EF 50-55%, inf HK.   Pre-diabetes    pt. told by [facility] staff that she is prediabetic, HgbA1c was 5.4   Stroke (HCC)    having TIA's    Past  Surgical History:  Procedure Laterality Date   BREAST SURGERY Left    cyst- L breast   CHOLECYSTECTOMY     CORONARY ARTERY BYPASS GRAFT N/A 06/09/2016   Procedure: CORONARY ARTERY BYPASS GRAFTING (CABG) times 4 using the left internal mammary artery and bilateral thigh greater saphenous veins harvested endoscopically. LIMA to LAD, SVG to DIAGONAL 2, SVG to OM, SVG to RCA;  Surgeon: Delight Ovens, MD;  Location: [facility] OR;  Service: Open Heart Surgery;  Laterality: N/A;   ENDARTERECTOMY Left 05/29/2016   Procedure: LEFT CAROTID ENDARTERECTOMY WITH PATCH ANGIOPLASTY;  Surgeon: Larina Earthly, MD;  Location: [facility] OR;  Service: Vascular;  Laterality: Left;   EYE SURGERY Right    cataract   LEFT HEART CATH AND CORONARY ANGIOGRAPHY N/A 06/07/2016   Procedure: Left Heart Cath and Coronary Angiography;  Surgeon: Iran Ouch, MD;  Location: [facility] INVASIVE CV LAB;  Service: Cardiovascular;  Laterality: N/A;   TEE WITHOUT CARDIOVERSION N/A 06/09/2016   Procedure: TRANSESOPHAGEAL ECHOCARDIOGRAM (TEE);  Surgeon: Delight Ovens, MD;  Location: [facility] OR;  Service: Open Heart Surgery;  Laterality: N/A;    Current Outpatient Medications  Medication Sig Dispense Refill   ALPRAZolam (XANAX) 0.25 MG tablet Take 0.25 mg by mouth 2 (  two) times daily. *May take one additional dose at lunch as needed     amLODipine (NORVASC) 5 MG tablet Take 1 tablet (5 mg total) by mouth daily. 90 tablet 3   antiseptic oral rinse (BIOTENE) LIQD 15 mLs by Mouth Rinse route as needed for dry mouth.     aspirin EC 81 MG tablet Take 1 tablet (81 mg total) by mouth daily with breakfast. 120 tablet 2   fexofenadine (ALLEGRA) 180 MG tablet Take 180 mg by mouth daily.     furosemide (LASIX) 20 MG tablet Take 1 tablet (20 mg total) by mouth daily. 20 tablet 0   isosorbide mononitrate (IMDUR) 60 MG 24 hr tablet Take 1 tablet (60 mg total) by mouth daily. 30 tablet 0   nitroGLYCERIN (NITROSTAT) 0.4 MG SL tablet DISSOLVE (1) TABLET UNDER THE TONGUE  EVERY 5 MINUTES AS NEEDED FOR CHEST PAIN. (Patient taking differently: Place 0.4 mg under the tongue every 5 (five) minutes as needed.) 25 tablet 3   rosuvastatin (CRESTOR) 5 MG tablet TAKE 1 TABLET BY MOUTH ONCE A DAY. 90 tablet 0   timolol (BETIMOL) 0.5 % ophthalmic solution Place 1 drop into the right eye 2 (two) times daily.      vitamin B-12 (CYANOCOBALAMIN) 1000 MCG tablet Take 1,000 mcg by mouth daily.     ezetimibe (ZETIA) 10 MG tablet Take 1 tablet (10 mg total) by mouth daily. (Patient not taking: Reported on 02/08/2022) 30 tablet 3   lidocaine (LIDODERM) 5 % Place 1 patch onto the skin daily. Remove & Discard patch within 12 hours or as directed by MD (Patient not taking: Reported on 02/08/2022) 30 patch 0   meclizine (ANTIVERT) 25 MG tablet Take 1 every 8-12 hours if you have spinning dizziness (Patient not taking: Reported on 02/08/2022) 12 tablet 0   Omega-3 Fatty Acids (FISH OIL) 1000 MG CAPS Take 1,000 mg by mouth daily. (Patient not taking: Reported on 02/08/2022)     potassium chloride 20 MEQ TBCR Take 20 mEq by mouth daily. (Patient not taking: Reported on 10/03/2021) 5 tablet 0   No current facility-administered medications for this visit.   Allergies:  Other, Peanut-containing drug products, Prednisone, Statins, Benadryl [diphenhydramine], Erythromycin, Tylenol [acetaminophen], and Milk-related compounds   Social History: The patient  reports that she has never smoked. She has never been exposed to tobacco smoke. She has never used smokeless tobacco. She reports that she does not drink alcohol and does not use drugs.   Family History: The patient's family history includes Heart disease in her sister and son.   ROS:  Please see the history of present illness. Otherwise, complete review of systems is positive for none.  All other systems are reviewed and negative.   Physical Exam: VS:  BP (!) 140/80 (BP Location: Left Arm, Patient Position: Sitting, Cuff Size: Normal)   Pulse  61   Ht 4\' 7"  (1.397 m)   Wt 159 lb (72.1 kg)   SpO2 95%   BMI 36.96 kg/m , BMI Body mass index is 36.96 kg/m.  Wt Readings from Last 3 Encounters:  02/08/22 159 lb (72.1 kg)  11/11/21 158 lb 11.7 oz (72 kg)  10/03/21 158 lb 11.7 oz (72 kg)    General: Patient appears comfortable at rest. HEENT: Conjunctiva and lids normal, oropharynx clear with moist mucosa. Neck: Supple, no elevated JVP or carotid bruits, no thyromegaly. Lungs: Clear to auscultation, nonlabored breathing at rest. Cardiac: Regular rate and rhythm, no S3 or significant systolic  murmur, no pericardial rub. Abdomen: Soft, nontender, no hepatomegaly, bowel sounds present, no guarding or rebound. Extremities: No pitting edema, distal pulses 2+. Skin: Warm and dry. Musculoskeletal: No kyphosis. Neuropsychiatric: Alert and oriented x3, affect grossly appropriate.  Recent Labwork: 10/03/2021: ALT 20; AST 31 11/11/2021: BUN 9; Creatinine, Ser 0.60; Hemoglobin 15.5; Platelets 217; Potassium 3.4; Sodium 139     Component Value Date/Time   CHOL 178 10/04/2021 1833   CHOL 157 12/29/2016 0955   TRIG 87 10/04/2021 1833   HDL 68 10/04/2021 1833   HDL 51 12/29/2016 0955   CHOLHDL 2.6 10/04/2021 1833   VLDL 17 10/04/2021 1833   LDLCALC 93 10/04/2021 1833   LDLCALC 87 12/29/2016 0955    Other Studies Reviewed Today: NM stress test 7/23 Notice of ischemia  Echo 7/23 LVEF 65 to 70% Diastology indeterminate  Ultrasound carotid bilateral in 2022 Minor carotid atherosclerosis. Negative for stenosis. Degree of narrowing less than 50% bilaterally by ultrasound criteria.  Assessment and Plan: Patient is a 81 year old F known to have CAD status post CABG in 2018 (LIMA to LAD, SVG to D2, SVG to OM and SVG to RCA), CAS s/p L CEA, HTN, HLD presented to cardiology clinic for follow-up visit.   #Noncardiac chest pain -Instructed patient to check with her PCP for any joint related issues in her left shoulder  #CAD status post  CABG in 2018 (LIMA to LAD, SVG to D2, SVG to OM and SVG to RCA) with normal LVEF, currently angina free -Continue aspirin 81 mg once daily -Continue rosuvastatin 5 mg nightly and start Zetia 10 mg once daily -Continue Imdur 30 mg once daily -SL NTG 0.4 mg as needed  #HLD, not at goal -Continue rosuvastatin 5 mg nightly and start Zetia 10 mg once daily.  Patient currently on low-dose of rosuvastatin as she did not tolerate higher doses of statins in the past due to myalgias. Goal LDL less than 70.  #HTN, controlled -Continue amlodipine 5 mg once daily  #CAS s/p L CEA -Repeat USG carotid bilateral in 2022 showed less than 50% stenosis bilaterally.  I have spent a total of 35 minutes with patient reviewing chart, EKGs, labs and examining patient as well as establishing an assessment and plan that was discussed with the patient.  > 50% of time was spent in direct patient care.      Medication Adjustments/Labs and Tests Ordered: Current medicines are reviewed at length with the patient today.  Concerns regarding medicines are outlined above.   Tests Ordered: No orders of the defined types were placed in this encounter.   Medication Changes: No orders of the defined types were placed in this encounter.   Disposition:  Follow up  1 year  Signed Julina Altmann Verne Spurr, MD, 02/08/2022 1:43 PM    [facility] at [facility] [address] [place], [place], [place] [address]

## 2022-02-21 ENCOUNTER — Telehealth: Payer: Self-pay | Admitting: Internal Medicine

## 2022-02-21 NOTE — Telephone Encounter (Signed)
Pt c/o medication issue:  1. Name of Medication: ezetimibe (ZETIA) 10 MG tablet  2. How are you currently taking this medication (dosage and times per day)? 1 tablet daily in the afternoon   3. Are you having a reaction (difficulty breathing--STAT)? Yes   4. What is your medication issue? Patient is calling stating she is having symptoms of not feeling well, dry mouth, and not being able to think straight every time she takes this medication. She states this has been occurring since the first day she began taking it. Please advise.

## 2022-02-22 NOTE — Telephone Encounter (Signed)
Patient made aware. States that she is not scheduled to see Dr. Jenene Slicker again until a year. Does she still need to wait that long? Please advise.

## 2022-02-22 NOTE — Telephone Encounter (Signed)
Patient states that the same day she started taking zetia, she started having altered thinking, vomiting up phlegm, neck pains and dry mouth. States that the thought this was her sinuses but thinks that this is coming from the medicine. States that last night she started having slight chest pains (that eased up once she turned over) with some sob and dizziness. States that she is not going to take anymore of the zetia because she did not start to feel like this until she started this medication.  Please advise

## 2022-02-22 NOTE — Telephone Encounter (Signed)
Pt calling back for an update on what she should do, pt states she is not feeling well

## 2022-02-22 NOTE — Telephone Encounter (Signed)
Telephone visit scheduled for Monday, 03/06/22 @ 4:00 pm.

## 2022-02-27 ENCOUNTER — Other Ambulatory Visit: Payer: Self-pay | Admitting: Student

## 2022-03-06 ENCOUNTER — Encounter: Payer: Self-pay | Admitting: Internal Medicine

## 2022-03-06 ENCOUNTER — Telehealth: Payer: Self-pay | Admitting: *Deleted

## 2022-03-06 ENCOUNTER — Ambulatory Visit: Payer: Medicare HMO | Attending: Internal Medicine | Admitting: Internal Medicine

## 2022-03-06 VITALS — Ht <= 58 in | Wt 160.0 lb

## 2022-03-06 DIAGNOSIS — E785 Hyperlipidemia, unspecified: Secondary | ICD-10-CM

## 2022-03-06 MED ORDER — NEXLETOL 180 MG PO TABS: 1.0000 | ORAL_TABLET | Freq: Every day | ORAL | 1 refills | Status: DC

## 2022-03-06 NOTE — Telephone Encounter (Signed)
  Patient Consent for Virtual Visit        Sheryl Carter has provided verbal consent on 03/06/2022 for a virtual visit (video or telephone).   CONSENT FOR VIRTUAL VISIT FOR:  Sheryl Carter  By participating in this virtual visit I agree to the following:  I hereby voluntarily request, consent and authorize [facility] and its employed or contracted physicians, physician assistants, nurse practitioners or other licensed health care professionals (the Practitioner), to provide me with telemedicine health care services (the "Services") as deemed necessary by the treating Practitioner. I acknowledge and consent to receive the Services by the Practitioner via telemedicine. I understand that the telemedicine visit will involve communicating with the Practitioner through live audiovisual communication technology and the disclosure of certain medical information by electronic transmission. I acknowledge that I have been given the opportunity to request an in-person assessment or other available alternative prior to the telemedicine visit and am voluntarily participating in the telemedicine visit.  I understand that I have the right to withhold or withdraw my consent to the use of telemedicine in the course of my care at any time, without affecting my right to future care or treatment, and that the Practitioner or I may terminate the telemedicine visit at any time. I understand that I have the right to inspect all information obtained and/or recorded in the course of the telemedicine visit and may receive copies of available information for a reasonable fee.  I understand that some of the potential risks of receiving the Services via telemedicine include:  Delay or interruption in medical evaluation due to technological equipment failure or disruption; Information transmitted may not be sufficient (e.g. poor resolution of images) to allow for appropriate medical decision making by the  Practitioner; and/or  In rare instances, security protocols could fail, causing a breach of personal health information.  Furthermore, I acknowledge that it is my responsibility to provide information about my medical history, conditions and care that is complete and accurate to the best of my ability. I acknowledge that Practitioner's advice, recommendations, and/or decision may be based on factors not within their control, such as incomplete or inaccurate data provided by me or distortions of diagnostic images or specimens that may result from electronic transmissions. I understand that the practice of medicine is not an exact science and that Practitioner makes no warranties or guarantees regarding treatment outcomes. I acknowledge that a copy of this consent can be made available to me via my patient portal [facility] MyChart), or I can request a printed copy by calling the office of [facility].    I understand that my insurance will be billed for this visit.   I have read or had this consent read to me. I understand the contents of this consent, which adequately explains the benefits and risks of the Services being provided via telemedicine.  I have been provided ample opportunity to ask questions regarding this consent and the Services and have had my questions answered to my satisfaction. I give my informed consent for the services to be provided through the use of telemedicine in my medical care

## 2022-03-06 NOTE — Progress Notes (Signed)
Virtual Visit via Telephone Note   Because of Sheryl Carter's co-morbid illnesses, she is at least at moderate risk for complications without adequate follow up.  This format is felt to be most appropriate for this patient at this time.  The patient did not have access to video technology/had technical difficulties with video requiring transitioning to audio format only (telephone).  All issues noted in this document were discussed and addressed.  No physical exam could be performed with this format.  Please refer to the patient's chart for her consent to telehealth for [facility].    Date:  03/06/2022   ID:  Sheryl Carter, DOB 08-27-1940, MRN [id number] The patient was identified using 2 identifiers.  Patient Location: Home Provider Location: Office/Clinic   PCP:  Elfredia Nevins, MD   [facility] Providers Cardiologist:  Marjo Bicker, MD     Evaluation Performed:  Follow-Up Visit  Chief Complaint: Hyperlipidemia management  History of Present Illness:    Sheryl Carter is a 81 y.o. female with CAD status post CABG in 2018 (LIMA to LAD, SVG to D2, SVG to OM and SVG to RCA), CAS s/p L CEA, HTN, HLD is here for telephone visit to discuss HLD management.  In the last clinic visit.  She was on rosuvastatin 5 mg nightly and Zetia 10 mg was added to the top of that with LDL goal of less than 70 preferably less than 55. However patient did not tolerate Zetia with side effects of headache and her mouth becoming dry. Hence she stopped taking Zetia.  I discussed with her the alternatives like PCSK9 inhibitors and bempedoic acid for HLD management. She stated it is difficult for her to self inject medication as she was blind in 1 eye and also thinks her family may not be able to do so due to work obligations.  Past Medical History:  Diagnosis Date   Anxiety    Blindness of left eye    CAD (coronary artery disease)    a. s/p CABG in 05/2016 with  LIMA-LAD, SVG-D2, SVG-OM, SVG-RCA   Complication of anesthesia    has gas trapped in her body after   DVT (deep venous thrombosis) (HCC)    Ear problems    Family history of adverse reaction to anesthesia    daughter has gas trapped in her body postop as well   Glaucoma    Hypercholesterolemia    Hypertension    Ischemic cardiomyopathy    a. 05/2016 Echo: EF 50-55%, inf HK.   Pre-diabetes    pt. told by [facility] staff that she is prediabetic, HgbA1c was 5.4   Stroke (HCC)    having TIA's   Past Surgical History:  Procedure Laterality Date   BREAST SURGERY Left    cyst- L breast   CHOLECYSTECTOMY     CORONARY ARTERY BYPASS GRAFT N/A 06/09/2016   Procedure: CORONARY ARTERY BYPASS GRAFTING (CABG) times 4 using the left internal mammary artery and bilateral thigh greater saphenous veins harvested endoscopically. LIMA to LAD, SVG to DIAGONAL 2, SVG to OM, SVG to RCA;  Surgeon: Delight Ovens, MD;  Location: [facility] OR;  Service: Open Heart Surgery;  Laterality: N/A;   ENDARTERECTOMY Left 05/29/2016   Procedure: LEFT CAROTID ENDARTERECTOMY WITH PATCH ANGIOPLASTY;  Surgeon: Larina Earthly, MD;  Location: [facility] OR;  Service: Vascular;  Laterality: Left;   EYE SURGERY Right    cataract   LEFT HEART CATH AND CORONARY ANGIOGRAPHY N/A  06/07/2016   Procedure: Left Heart Cath and Coronary Angiography;  Surgeon: Iran Ouch, MD;  Location: [facility] INVASIVE CV LAB;  Service: Cardiovascular;  Laterality: N/A;   TEE WITHOUT CARDIOVERSION N/A 06/09/2016   Procedure: TRANSESOPHAGEAL ECHOCARDIOGRAM (TEE);  Surgeon: Delight Ovens, MD;  Location: [facility] OR;  Service: Open Heart Surgery;  Laterality: N/A;     Current Meds  Medication Sig   ALPRAZolam (XANAX) 0.25 MG tablet Take 0.25 mg by mouth 2 (two) times daily. *May take one additional dose at lunch as needed   amLODipine (NORVASC) 5 MG tablet Take 1 tablet (5 mg total) by mouth daily.   antiseptic oral rinse (BIOTENE) LIQD 15 mLs by Mouth Rinse route as needed for  dry mouth.   aspirin EC 81 MG tablet Take 1 tablet (81 mg total) by mouth daily with breakfast.   fexofenadine (ALLEGRA) 180 MG tablet Take 180 mg by mouth daily.   furosemide (LASIX) 20 MG tablet Take 1 tablet (20 mg total) by mouth daily.   isosorbide mononitrate (IMDUR) 30 MG 24 hr tablet Take 30 mg by mouth daily.   nitroGLYCERIN (NITROSTAT) 0.4 MG SL tablet DISSOLVE (1) TABLET UNDER THE TONGUE EVERY 5 MINUTES AS NEEDED FOR CHEST PAIN. (Patient taking differently: Place 0.4 mg under the tongue every 5 (five) minutes x 3 doses as needed.)   potassium chloride 20 MEQ TBCR Take 20 mEq by mouth daily.   rosuvastatin (CRESTOR) 5 MG tablet TAKE 1 TABLET BY MOUTH ONCE A DAY.   timolol (BETIMOL) 0.5 % ophthalmic solution Place 1 drop into the right eye 2 (two) times daily.    vitamin B-12 (CYANOCOBALAMIN) 1000 MCG tablet Take 1,000 mcg by mouth daily.     Allergies:   Other, Peanut-containing drug products, Prednisone, Statins, Benadryl [diphenhydramine], Erythromycin, Tylenol [acetaminophen], and Milk-related compounds   Social History   Tobacco Use   Smoking status: Never    Passive exposure: Never   Smokeless tobacco: Never  Vaping Use   Vaping Use: Never used  Substance Use Topics   Alcohol use: No   Drug use: No     Family Hx: The patient's family history includes Heart disease in her sister and son.  ROS:   Please see the history of present illness.    All other systems reviewed and are negative.   Prior CV studies:   The following studies were reviewed today:    Labs/Other Tests and Data Reviewed:    EKG:  NSR  Recent Labs: 10/03/2021: ALT 20 11/11/2021: BUN 9; Creatinine, Ser 0.60; Hemoglobin 15.5; Platelets 217; Potassium 3.4; Sodium 139   Recent Lipid Panel Lab Results  Component Value Date/Time   CHOL 178 10/04/2021 06:33 PM   CHOL 157 12/29/2016 09:55 AM   TRIG 87 10/04/2021 06:33 PM   HDL 68 10/04/2021 06:33 PM   HDL 51 12/29/2016 09:55 AM   CHOLHDL 2.6  10/04/2021 06:33 PM   LDLCALC 93 10/04/2021 06:33 PM   LDLCALC 87 12/29/2016 09:55 AM    Wt Readings from Last 3 Encounters:  03/06/22 160 lb (72.6 kg)  02/08/22 159 lb (72.1 kg)  11/11/21 158 lb 11.7 oz (72 kg)     Risk Assessment/Calculations:          Objective:    Vital Signs:  Ht 4\' 7"  (1.397 m)   Wt 160 lb (72.6 kg)   BMI 37.19 kg/m    VITAL SIGNS:  reviewed  ASSESSMENT & PLAN:    Patient is 81 year old  F known to have CAD status post CABG in 2018 (LIMA to LAD, SVG to D2, SVG to OM and SVG to RCA), CAS s/p L CEA, HTN, HLD is here for telephone visit to discuss HLD management.   # HLD, not at goal (LDL 94 in 7/23) -Continue rosuvastatin 5 mg nightly.  Did not tolerate Zetia. I discussed alternatives like PCSK9 inhibitors and bempedoic acid to reach LDL goal of less than 55. She stated it is difficult for her to self inject medication as she was blind in 1 eye and also thinks her family may not be able to do so due to work obligations.  Will start bempedoic acid 180 mg once daily in addition to statin.  Lipid panel in 3 months.  Can obtain from her PCP.    #CAD status post CABG in 2018 (LIMA to LAD, SVG to D2, SVG to OM and SVG to RCA), currently angina free -Continue aspirin 81 mg once daily -Continue rosuvastatin 5 mg nightly and start bempedoic acid 180 mg once daily -Continue Imdur 30 mg once daily -SL NTG 0.4 mg as needed  # HTN -Continue amlodipine 5 mg once daily  # CAS s/p L CEA -Repeat USG carotid bilateral in 2022 showed less than 50% stenosis bilaterally     Time:   Today, I have spent 15 minutes with the patient with telehealth technology discussing the above problems.     Medication Adjustments/Labs and Tests Ordered: Current medicines are reviewed at length with the patient today.  Concerns regarding medicines are outlined above.   Tests Ordered: No orders of the defined types were placed in this encounter.   Medication Changes: No orders of  the defined types were placed in this encounter.   Follow Up:  In Person  one year  Signed, Marjo Bicker, MD  03/06/2022 2:56 PM    [facility]

## 2022-03-06 NOTE — Patient Instructions (Addendum)
Medication Instructions:  Your physician has recommended you make the following change in your medication:  Start nexletol 180 mg daily Continue other medications the same  Labwork: none  Testing/Procedures: none  Follow-Up: Your physician recommends that you schedule a follow-up appointment in: 1 year as planned  Any Other Special Instructions Will Be Listed Below (If Applicable).  If you need a refill on your cardiac medications before your next appointment, please call your pharmacy.

## 2022-03-07 ENCOUNTER — Encounter: Payer: Self-pay | Admitting: *Deleted

## 2022-03-07 NOTE — Progress Notes (Signed)
Fax notification received from [facility] that nexletol 180 mg daily prior authorization approved through 03/27/2023.

## 2022-04-26 ENCOUNTER — Telehealth: Payer: Self-pay | Admitting: Internal Medicine

## 2022-04-26 MED ORDER — NEXLETOL 180 MG PO TABS: 1.0000 | ORAL_TABLET | Freq: Every day | ORAL | 2 refills | Status: DC

## 2022-04-26 NOTE — Telephone Encounter (Signed)
*  STAT* If patient is at the pharmacy, call can be transferred to refill team.   1. Which medications need to be refilled? (please list name of each medication and dose if known)   Bempedoic Acid (NEXLETOL) 180 MG TABS    2. Which pharmacy/location (including street and city if local pharmacy) is medication to be sent to?  [place], [address]    3. Do they need a 30 day or 90 day supply? 90 days  Pt's daughter calling back, she said, she mistakenly requested refill to send to the wrong pharmacy, she said, the correct pharmacy to send refill to is at  [place], [place]

## 2022-04-26 NOTE — Telephone Encounter (Signed)
*  STAT* If patient is at the pharmacy, call can be transferred to refill team.   1. Which medications need to be refilled? (please list name of each medication and dose if known) Bempedoic Acid (NEXLETOL) 180 MG TABS  2. Which pharmacy/location (including street and city if local pharmacy) is medication to be sent to?  [place]  3. Do they need a 30 day or 90 day supply? [place]

## 2022-04-26 NOTE — Telephone Encounter (Signed)
Spoke with Threasa Beards at [facility] who says rx picked up by patient today but they would have rx for nexletol transferred to their store. Daughter Tandy Gaw informed and verbalized understanding.

## 2022-08-01 ENCOUNTER — Ambulatory Visit
Admission: EM | Admit: 2022-08-01 | Discharge: 2022-08-01 | Disposition: A | Discharge status: Home / Self Care | Payer: Medicare HMO

## 2022-08-01 DIAGNOSIS — H6123 Impacted cerumen, bilateral: Secondary | ICD-10-CM

## 2022-08-01 NOTE — ED Triage Notes (Signed)
Pt c/o per pt's daughter pt is having difficulty hearing out of both ear. States she's been having dizziness,

## 2022-08-01 NOTE — Discharge Instructions (Signed)
We were able to remove the ear wax from your ears today.  Please avoid Q tips and you can use OTC Debrox drop occasionally to help loosen wax.

## 2022-08-01 NOTE — ED Provider Notes (Signed)
[facility]    CSN: [id number] Arrival date & time: 08/01/22  1013      History   Chief Complaint No chief complaint on file.   HPI Sheryl Carter is a 82 y.o. female.   Patient presents today with daughter for a few week history of decreased hearing out of both ears and dizziness.  No fever, body aches or chills.  Patient does have a cough, however this is chronic per the daughter because of allergies.  Patient is unable to take allergy medication daily secondary to history of glaucoma of the right eye, has blindness of the left eye.  No chest pain or shortness of breath, abdominal pain, nausea/vomiting, or diarrhea.  Patient does have a sore throat, however no headache or ear pain.  No drainage from the ears.  Patient has a history of ear ceruminosis and does not use Q tips regularly.    Past Medical History:  Diagnosis Date   Anxiety    Blindness of left eye    CAD (coronary artery disease)    a. s/p CABG in 05/2016 with LIMA-LAD, SVG-D2, SVG-OM, SVG-RCA   Complication of anesthesia    has gas trapped in her body after   DVT (deep venous thrombosis) (HCC)    Ear problems    Family history of adverse reaction to anesthesia    daughter has gas trapped in her body postop as well   Glaucoma    Hypercholesterolemia    Hypertension    Ischemic cardiomyopathy    a. 05/2016 Echo: EF 50-55%, inf HK.   Pre-diabetes    pt. 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   Coronary artery disease    NSTEMI (non-ST elevated myocardial infarction) (HCC) 06/07/2016   Paresthesia    TIA (transient ischemic attack) 06/04/2016   Carotid artery stenosis 05/29/2016   Slurred speech 04/18/2016    Hypertension, essential 04/18/2016   Hyperlipidemia 04/18/2016   Chronic anxiety 04/18/2016    Past Surgical History:  Procedure Laterality Date   BREAST SURGERY Left    cyst- L breast   CHOLECYSTECTOMY     CORONARY ARTERY BYPASS GRAFT N/A 06/09/2016   Procedure: CORONARY ARTERY BYPASS GRAFTING (CABG) times 4 using the left internal mammary artery and bilateral thigh greater saphenous veins harvested endoscopically. LIMA to LAD, SVG to DIAGONAL 2, SVG to OM, SVG to RCA;  Surgeon: Delight Ovens, MD;  Location: [facility] OR;  Service: Open Heart Surgery;  Laterality: N/A;   ENDARTERECTOMY Left 05/29/2016   Procedure: LEFT CAROTID ENDARTERECTOMY WITH PATCH ANGIOPLASTY;  Surgeon: Larina Earthly, MD;  Location: [facility] OR;  Service: Vascular;  Laterality: Left;   EYE SURGERY Right    cataract   LEFT HEART CATH AND CORONARY ANGIOGRAPHY N/A 06/07/2016   Procedure: Left Heart Cath and Coronary Angiography;  Surgeon: Iran Ouch, MD;  Location: [facility] INVASIVE CV LAB;  Service: Cardiovascular;  Laterality: N/A;   TEE WITHOUT CARDIOVERSION N/A 06/09/2016   Procedure: TRANSESOPHAGEAL ECHOCARDIOGRAM (TEE);  Surgeon: Delight Ovens, MD;  Location[facility] OR;  Service: Open Heart Surgery;  Laterality: N/A;    OB History   No obstetric history on  file.      Home Medications    Prior to Admission medications   Medication Sig Start Date End Date Taking? Authorizing Provider  timolol (TIMOPTIC) 0.5 % ophthalmic solution Place 1 drop into the right eye 2 (two) times daily. 06/14/22  Yes [provider]  ALPRAZolam (XANAX) 0.25 MG tablet Take 0.25 mg by mouth 2 (two) times daily. *May take one additional dose at lunch as needed    [provider]  amLODipine (NORVASC) 5 MG tablet Take 1 tablet (5 mg total) by mouth daily. 12/31/19 02/09/23  Ellsworth Lennox, PA-C  antiseptic oral rinse (BIOTENE) LIQD 15 mLs by Mouth Rinse route as needed for dry mouth.    [provider]  aspirin EC  81 MG tablet Take 1 tablet (81 mg total) by mouth daily with breakfast. 03/18/19   Emokpae, Courage, MD  Bempedoic Acid (NEXLETOL) 180 MG TABS Take 1 tablet (180 mg total) by mouth daily. 04/26/22   Mallipeddi, Vishnu P, MD  fexofenadine (ALLEGRA) 180 MG tablet Take 180 mg by mouth daily.    [provider]  furosemide (LASIX) 20 MG tablet Take 1 tablet (20 mg total) by mouth daily. 01/31/21   Blue, Soijett A, PA-C  isosorbide mononitrate (IMDUR) 30 MG 24 hr tablet Take 30 mg by mouth daily.    [provider]  nitroGLYCERIN (NITROSTAT) 0.4 MG SL tablet DISSOLVE (1) TABLET UNDER THE TONGUE EVERY 5 MINUTES AS NEEDED FOR CHEST PAIN. Patient taking differently: Place 0.4 mg under the tongue every 5 (five) minutes x 3 doses as needed. 02/07/21   Antoine Poche, MD  potassium chloride 20 MEQ TBCR Take 20 mEq by mouth daily. 07/04/21   Burgess Amor, PA-C  rosuvastatin (CRESTOR) 5 MG tablet TAKE 1 TABLET BY MOUTH ONCE A DAY. 02/28/22   Wendall Stade, MD  timolol (BETIMOL) 0.5 % ophthalmic solution Place 1 drop into the right eye 2 (two) times daily.     [provider]  vitamin B-12 (CYANOCOBALAMIN) 1000 MCG tablet Take 1,000 mcg by mouth daily.    [provider]    Family History Family History  Problem Relation Age of Onset   Heart disease Sister    Heart disease Son     Social History Social History   Tobacco Use   Smoking status: Never    Passive exposure: Never   Smokeless tobacco: Never  Vaping Use   Vaping Use: Never used  Substance Use Topics   Alcohol use: No   Drug use: No     Allergies   Other, Peanut-containing drug products, Prednisone, Statins, Benadryl [diphenhydramine], Erythromycin, Tylenol [acetaminophen], and Milk-related compounds   Review of Systems Review of Systems Per HPI  Physical Exam Triage Vital Signs ED Triage Vitals  Enc Vitals Group     BP 08/01/22 1149 (!) 143/84     Pulse Rate 08/01/22 1149 74     Resp  08/01/22 1149 20     Temp 08/01/22 1149 (!) 97.5 F (36.4 C)     Temp Source 08/01/22 1149 Oral     SpO2 08/01/22 1149 95 %     Weight --      Height --      Head Circumference --      Peak Flow --      Pain Score 08/01/22 1154 7     Pain Loc --      Pain Edu? --      Excl. in GC? --  No data found.  Updated Vital Signs BP (!) 143/84 (BP Location: Right Arm)   Pulse 74   Temp (!) 97.5 F (36.4 C) (Oral)   Resp 20   SpO2 95%   Visual Acuity Right Eye Distance:   Left Eye Distance:   Bilateral Distance:    Right Eye Near:   Left Eye Near:    Bilateral Near:     Physical Exam Vitals and nursing note reviewed.  Constitutional:      General: She is not in acute distress.    Appearance: Normal appearance. She is not toxic-appearing.  HENT:     Head: Normocephalic and atraumatic.     Right Ear: There is impacted cerumen.     Left Ear: There is impacted cerumen.     Nose: Nose normal. No congestion or rhinorrhea.     Mouth/Throat:     Mouth: Mucous membranes are moist.     Pharynx: Oropharynx is clear. No oropharyngeal exudate or posterior oropharyngeal erythema.  Cardiovascular:     Rate and Rhythm: Normal rate and regular rhythm.  Pulmonary:     Effort: Pulmonary effort is normal. No respiratory distress.     Breath sounds: Normal breath sounds. No wheezing, rhonchi or rales.  Musculoskeletal:     Cervical back: Normal range of motion.  Lymphadenopathy:     Cervical: No cervical adenopathy.  Skin:    General: Skin is warm and dry.     Capillary Refill: Capillary refill takes less than 2 seconds.     Coloration: Skin is not jaundiced or pale.     Findings: No erythema.  Neurological:     Mental Status: She is alert and oriented to person, place, and time.  Psychiatric:        Behavior: Behavior is cooperative.      UC Treatments / Results  Labs (all labs ordered are listed, but only abnormal results are displayed) Labs Reviewed - No data to  display  EKG   Radiology No results found.  Procedures Ear Cerumen Removal  Date/Time: 08/01/2022 1:31 PM  Performed by: Valentino Nose, NP Authorized by: Valentino Nose, NP   Consent:    Consent obtained:  Verbal   Consent given by:  Patient   Risks, benefits, and alternatives were discussed: yes     Risks discussed:  Pain, TM perforation, dizziness and incomplete removal   Alternatives discussed:  Alternative treatment Universal protocol:    Procedure explained and questions answered to patient or proxy's satisfaction: yes     Patient identity confirmed:  Verbally with patient Procedure details:    Location:  L ear and R ear   Procedure type comment:  Irrigation used first, then currette to remove remaining cerumen Post-procedure details:    Inspection:  Some cerumen remaining, TM intact and no bleeding   Hearing quality:  Improved   Procedure completion:  Tolerated well, no immediate complications  (including critical care time)  Medications Ordered in UC Medications - No data to display  Initial Impression / Assessment and Plan / UC Course  I have reviewed the triage vital signs and the nursing notes.  Pertinent labs & imaging results that were available during my care of the patient were reviewed by me and considered in my medical decision making (see chart for details).   Patient is well-appearing, normotensive, afebrile, not tachycardic, not tachypneic, oxygenating well on room air.    1. Bilateral impacted cerumen Ear lavage provided without relief of cerumen  I used a plastic curette to remove remaining cerumen out of bilateral ears; patient tolerated very well and tympanic membrane's intact bilaterally without signs of infection or perforation after removal Recommended use of Debrox drops as needed for earwax buildup  The patient was given the opportunity to ask questions.  All questions answered to their satisfaction.  The patient is in agreement to  this plan.    Final Clinical Impressions(s) / UC Diagnoses   Final diagnoses:  Bilateral impacted cerumen     Discharge Instructions      We were able to remove the ear wax from your ears today.  Please avoid Q tips and you can use OTC Debrox drop occasionally to help loosen wax.    ED Prescriptions   None    PDMP not reviewed this encounter.   Valentino Nose, NP 08/01/22 1332

## 2022-08-09 DIAGNOSIS — H401131 Primary open-angle glaucoma, bilateral, mild stage: Secondary | ICD-10-CM | POA: Diagnosis not present

## 2022-09-20 DIAGNOSIS — K219 Gastro-esophageal reflux disease without esophagitis: Secondary | ICD-10-CM | POA: Diagnosis not present

## 2022-09-20 DIAGNOSIS — E6609 Other obesity due to excess calories: Secondary | ICD-10-CM | POA: Diagnosis not present

## 2022-09-20 DIAGNOSIS — H8110 Benign paroxysmal vertigo, unspecified ear: Secondary | ICD-10-CM | POA: Diagnosis not present

## 2022-09-20 DIAGNOSIS — E559 Vitamin D deficiency, unspecified: Secondary | ICD-10-CM | POA: Diagnosis not present

## 2022-09-20 DIAGNOSIS — Z6836 Body mass index (BMI) 36.0-36.9, adult: Secondary | ICD-10-CM | POA: Diagnosis not present

## 2022-09-20 DIAGNOSIS — R7309 Other abnormal glucose: Secondary | ICD-10-CM | POA: Diagnosis not present

## 2022-09-20 DIAGNOSIS — Z0001 Encounter for general adult medical examination with abnormal findings: Secondary | ICD-10-CM | POA: Diagnosis not present

## 2022-09-20 DIAGNOSIS — I10 Essential (primary) hypertension: Secondary | ICD-10-CM | POA: Diagnosis not present

## 2022-09-20 DIAGNOSIS — Z9229 Personal history of other drug therapy: Secondary | ICD-10-CM | POA: Diagnosis not present

## 2022-09-20 DIAGNOSIS — E538 Deficiency of other specified B group vitamins: Secondary | ICD-10-CM | POA: Diagnosis not present

## 2022-09-20 DIAGNOSIS — R946 Abnormal results of thyroid function studies: Secondary | ICD-10-CM | POA: Diagnosis not present

## 2022-10-20 DIAGNOSIS — H8110 Benign paroxysmal vertigo, unspecified ear: Secondary | ICD-10-CM | POA: Diagnosis not present

## 2022-10-20 DIAGNOSIS — J0100 Acute maxillary sinusitis, unspecified: Secondary | ICD-10-CM | POA: Diagnosis not present

## 2022-10-20 DIAGNOSIS — I10 Essential (primary) hypertension: Secondary | ICD-10-CM | POA: Diagnosis not present

## 2022-10-20 DIAGNOSIS — I110 Hypertensive heart disease with heart failure: Secondary | ICD-10-CM | POA: Diagnosis not present

## 2022-10-20 DIAGNOSIS — K219 Gastro-esophageal reflux disease without esophagitis: Secondary | ICD-10-CM | POA: Diagnosis not present

## 2022-11-08 ENCOUNTER — Ambulatory Visit
Admission: RE | Admit: 2022-11-08 | Discharge: 2022-11-08 | Disposition: A | Discharge status: Home / Self Care | Payer: Medicare HMO | Source: Ambulatory Visit

## 2022-11-08 VITALS — BP 146/80 | HR 71 | Temp 98.2°F | Resp 13

## 2022-11-08 DIAGNOSIS — R5383 Other fatigue: Secondary | ICD-10-CM

## 2022-11-08 DIAGNOSIS — R531 Weakness: Secondary | ICD-10-CM

## 2022-11-08 DIAGNOSIS — W19XXXA Unspecified fall, initial encounter: Secondary | ICD-10-CM | POA: Diagnosis not present

## 2022-11-08 DIAGNOSIS — R519 Headache, unspecified: Secondary | ICD-10-CM | POA: Diagnosis not present

## 2022-11-08 LAB — POCT URINALYSIS DIP (MANUAL ENTRY)
Bilirubin, UA: NEGATIVE
Blood, UA: NEGATIVE
Glucose, UA: NEGATIVE mg/dL
Ketones, POC UA: NEGATIVE mg/dL
Leukocytes, UA: NEGATIVE
Nitrite, UA: NEGATIVE
Protein Ur, POC: NEGATIVE mg/dL
Spec Grav, UA: 1.015 (ref 1.010–1.025)
Urobilinogen, UA: 0.2 E.U./dL
pH, UA: 6.5 (ref 5.0–8.0)

## 2022-11-08 NOTE — ED Triage Notes (Signed)
Pt c/o headache, pressure in the center of the head left eye pain weakness, feeling of falling high blood pressure, dry mouth, x 1 week, actually fell on x 4 days ago   Pt hx of vertigo

## 2022-11-08 NOTE — Discharge Instructions (Signed)
Your workup today has been reassuring, your labs should be back tomorrow and someone will call with abnormal results.  Make sure to stay well-hydrated, get plenty of rest, eat proper nutrition and follow-up closely with your primary care provider for a recheck.  In any time that things become worse go to the emergency department for further evaluation

## 2022-11-09 LAB — CBC WITH DIFFERENTIAL/PLATELET
Basophils Absolute: 0 10*3/uL (ref 0.0–0.2)
Basos: 1 %
EOS (ABSOLUTE): 0.1 10*3/uL (ref 0.0–0.4)
Eos: 1 %
Hematocrit: 42.6 % (ref 34.0–46.6)
Hemoglobin: 14.7 g/dL (ref 11.1–15.9)
Immature Grans (Abs): 0 10*3/uL (ref 0.0–0.1)
Immature Granulocytes: 0 %
Lymphocytes Absolute: 2.8 10*3/uL (ref 0.7–3.1)
Lymphs: 44 %
MCH: 32 pg (ref 26.6–33.0)
MCHC: 34.5 g/dL (ref 31.5–35.7)
MCV: 93 fL (ref 79–97)
Monocytes Absolute: 0.4 10*3/uL (ref 0.1–0.9)
Monocytes: 6 %
Neutrophils Absolute: 3 10*3/uL (ref 1.4–7.0)
Neutrophils: 48 %
Platelets: 230 10*3/uL (ref 150–450)
RBC: 4.59 x10E6/uL (ref 3.77–5.28)
RDW: 12.5 % (ref 11.7–15.4)
WBC: 6.3 10*3/uL (ref 3.4–10.8)

## 2022-11-09 LAB — COMPREHENSIVE METABOLIC PANEL
ALT: 15 IU/L (ref 0–32)
AST: 28 IU/L (ref 0–40)
Albumin: 4.4 g/dL (ref 3.7–4.7)
Alkaline Phosphatase: 77 IU/L (ref 44–121)
BUN/Creatinine Ratio: 12 (ref 12–28)
BUN: 10 mg/dL (ref 8–27)
Bilirubin Total: 0.5 mg/dL (ref 0.0–1.2)
CO2: 28 mmol/L (ref 20–29)
Calcium: 9.6 mg/dL (ref 8.7–10.3)
Chloride: 102 mmol/L (ref 96–106)
Creatinine, Ser: 0.85 mg/dL (ref 0.57–1.00)
Globulin, Total: 1.9 g/dL (ref 1.5–4.5)
Glucose: 99 mg/dL (ref 70–99)
Potassium: 3.9 mmol/L (ref 3.5–5.2)
Sodium: 143 mmol/L (ref 134–144)
Total Protein: 6.3 g/dL (ref 6.0–8.5)
eGFR: 68 mL/min/{1.73_m2} (ref >59)

## 2022-11-09 NOTE — ED Provider Notes (Signed)
[facility]    CSN: [id number] Arrival date & time: 11/08/22  1147      History   Chief Complaint Chief Complaint  Patient presents with   Headache    Entered by patient    HPI Sheryl Carter is a 82 y.o. female.   Patient presenting today with about 5 weeks or so of fatigue, malaise, and about a week of headaches, pressure behind her eyes.  Her daughter who is with her today states she thinks she may have had COVID a week or so ago and was given an antibiotic by primary care.  No significant improvement with the antibiotic and they are wondering if the fatigue and brain fog could be coming from that or something else.  Denies cough, chest pain, shortness of breath, abdominal pain, nausea vomiting or diarrhea.  Fell 4 days ago due to the weakness, no apparent injuries from this fall and did not hit head or lose consciousness.    Past Medical History:  Diagnosis Date   Anxiety    Blindness of left eye    CAD (coronary artery disease)    a. s/p CABG in 05/2016 with LIMA-LAD, SVG-D2, SVG-OM, SVG-RCA   Complication of anesthesia    has gas trapped in her body after   DVT (deep venous thrombosis) (HCC)    Ear problems    Family history of adverse reaction to anesthesia    daughter has gas trapped in her body postop as well   Glaucoma    Hypercholesterolemia    Hypertension    Ischemic cardiomyopathy    a. 05/2016 Echo: EF 50-55%, inf HK.   Pre-diabetes    pt. 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   Coronary artery disease    NSTEMI (non-ST elevated myocardial infarction) (HCC) 06/07/2016   Paresthesia    TIA (transient ischemic attack) 06/04/2016   Carotid artery  stenosis 05/29/2016   Slurred speech 04/18/2016   Hypertension, essential 04/18/2016   Hyperlipidemia 04/18/2016   Chronic anxiety 04/18/2016    Past Surgical History:  Procedure Laterality Date   BREAST SURGERY Left    cyst- L breast   CHOLECYSTECTOMY     CORONARY ARTERY BYPASS GRAFT N/A 06/09/2016   Procedure: CORONARY ARTERY BYPASS GRAFTING (CABG) times 4 using the left internal mammary artery and bilateral thigh greater saphenous veins harvested endoscopically. LIMA to LAD, SVG to DIAGONAL 2, SVG to OM, SVG to RCA;  Surgeon: Delight Ovens, MD;  Location: [facility] OR;  Service: Open Heart Surgery;  Laterality: N/A;   ENDARTERECTOMY Left 05/29/2016   Procedure: LEFT CAROTID ENDARTERECTOMY WITH PATCH ANGIOPLASTY;  Surgeon: Larina Earthly, MD;  Location: [facility] OR;  Service: Vascular;  Laterality: Left;   EYE SURGERY Right    cataract   LEFT HEART CATH AND CORONARY ANGIOGRAPHY N/A 06/07/2016   Procedure: Left Heart Cath and Coronary Angiography;  Surgeon: Iran Ouch, MD;  Location: [facility] INVASIVE CV LAB;  Service: Cardiovascular;  Laterality: N/A;   TEE WITHOUT CARDIOVERSION N/A 06/09/2016   Procedure: TRANSESOPHAGEAL ECHOCARDIOGRAM (TEE);  Surgeon: Delight Ovens, MD;  Location: [facility] OR;  Service: Open Heart Surgery;  Laterality: N/A;  OB History   No obstetric history on file.      Home Medications    Prior to Admission medications   Medication Sig Start Date End Date Taking? Authorizing Provider  Vitamin D, Ergocalciferol, (DRISDOL) 1.25 MG (50000 UNIT) CAPS capsule Take 50,000 Units by mouth once a week. 09/21/22  Yes [provider]  ALPRAZolam (XANAX) 0.25 MG tablet Take 0.25 mg by mouth 2 (two) times daily. *May take one additional dose at lunch as needed    [provider]  amLODipine (NORVASC) 5 MG tablet Take 1 tablet (5 mg total) by mouth daily. 12/31/19 02/09/23  Ellsworth Lennox, PA-C  antiseptic oral rinse (BIOTENE) LIQD 15 mLs by Mouth Rinse route as  needed for dry mouth.    [provider]  aspirin EC 81 MG tablet Take 1 tablet (81 mg total) by mouth daily with breakfast. 03/18/19   Emokpae, Courage, MD  Bempedoic Acid (NEXLETOL) 180 MG TABS Take 1 tablet (180 mg total) by mouth daily. 04/26/22   Mallipeddi, Vishnu P, MD  fexofenadine (ALLEGRA) 180 MG tablet Take 180 mg by mouth daily.    [provider]  furosemide (LASIX) 20 MG tablet Take 1 tablet (20 mg total) by mouth daily. 01/31/21   Blue, Soijett A, PA-C  isosorbide mononitrate (IMDUR) 30 MG 24 hr tablet Take 30 mg by mouth daily.    [provider]  nitroGLYCERIN (NITROSTAT) 0.4 MG SL tablet DISSOLVE (1) TABLET UNDER THE TONGUE EVERY 5 MINUTES AS NEEDED FOR CHEST PAIN. Patient taking differently: Place 0.4 mg under the tongue every 5 (five) minutes x 3 doses as needed. 02/07/21   Antoine Poche, MD  potassium chloride 20 MEQ TBCR Take 20 mEq by mouth daily. 07/04/21   Burgess Amor, PA-C  rosuvastatin (CRESTOR) 5 MG tablet TAKE 1 TABLET BY MOUTH ONCE A DAY. 02/28/22   Wendall Stade, MD  timolol (BETIMOL) 0.5 % ophthalmic solution Place 1 drop into the right eye 2 (two) times daily.     [provider]  timolol (TIMOPTIC) 0.5 % ophthalmic solution Place 1 drop into the right eye 2 (two) times daily. 06/14/22   [provider]  vitamin B-12 (CYANOCOBALAMIN) 1000 MCG tablet Take 1,000 mcg by mouth daily.    [provider]    Family History Family History  Problem Relation Age of Onset   Heart disease Sister    Heart disease Son     Social History Social History   Tobacco Use   Smoking status: Never    Passive exposure: Never   Smokeless tobacco: Never  Vaping Use   Vaping status: Never Used  Substance Use Topics   Alcohol use: No   Drug use: No     Allergies   Other, Peanut-containing drug products, Prednisone, Statins, Benadryl [diphenhydramine], Erythromycin, Tylenol [acetaminophen], and Milk-related  compounds   Review of Systems Review of Systems Per HPI  Physical Exam Triage Vital Signs ED Triage Vitals  Encounter Vitals Group     BP 11/08/22 1155 (!) 146/80     Systolic BP Percentile --      Diastolic BP Percentile --      Pulse Rate 11/08/22 1155 71     Resp 11/08/22 1155 13     Temp 11/08/22 1155 98.2 F (36.8 C)     Temp Source 11/08/22 1155 Oral     SpO2 11/08/22 1155 93 %     Weight --      Height --  Head Circumference --      Peak Flow --      Pain Score 11/08/22 1158 0     Pain Loc --      Pain Education --      Exclude from Growth Chart --    No data found.  Updated Vital Signs BP (!) 146/80 (BP Location: Right Arm)   Pulse 71   Temp 98.2 F (36.8 C) (Oral)   Resp 13   SpO2 93%   Visual Acuity Right Eye Distance:   Left Eye Distance:   Bilateral Distance:    Right Eye Near:   Left Eye Near:    Bilateral Near:     Physical Exam Vitals and nursing note reviewed.  Constitutional:      Appearance: Normal appearance. She is not ill-appearing.  HENT:     Head: Atraumatic.     Mouth/Throat:     Mouth: Mucous membranes are moist.  Eyes:     Extraocular Movements: Extraocular movements intact.     Conjunctiva/sclera: Conjunctivae normal.     Pupils: Pupils are equal, round, and reactive to light.  Cardiovascular:     Rate and Rhythm: Normal rate and regular rhythm.     Heart sounds: Normal heart sounds.  Pulmonary:     Effort: Pulmonary effort is normal.     Breath sounds: Normal breath sounds.  Musculoskeletal:     Cervical back: Normal range of motion and neck supple.     Comments: Range of motion and strength at baseline  Skin:    General: Skin is warm and dry.  Neurological:     General: No focal deficit present.     Mental Status: She is alert. Mental status is at baseline.     Cranial Nerves: No cranial nerve deficit.     Motor: No weakness.     Gait: Gait normal.  Psychiatric:        Mood and Affect: Mood normal.         Thought Content: Thought content normal.        Judgment: Judgment normal.      UC Treatments / Results  Labs (all labs ordered are listed, but only abnormal results are displayed) Labs Reviewed  CBC WITH DIFFERENTIAL/PLATELET   Narrative:    Performed at:  [address] [place] [address] [place], [place]  [id number] Lab Director: Jolene Schimke MD, Phone:  [phone]  COMPREHENSIVE METABOLIC PANEL   Narrative:    Performed at:  [address], [place], [place]  [id number] Lab Director: Jolene Schimke MD, Phone:  [phone]  POCT URINALYSIS DIP (MANUAL ENTRY)    EKG   Radiology No results found.  Procedures Procedures (including critical care time)  Medications Ordered in UC Medications - No data to display  Initial Impression / Assessment and Plan / UC Course  I have reviewed the triage vital signs and the nursing notes.  Pertinent labs & imaging results that were available during my care of the patient were reviewed by me and considered in my medical decision making (see chart for details).     Very mildly hypertensive in triage, otherwise vital signs within normal limits.  She is overall well-appearing, alert and oriented and in no acute distress.  No obvious abnormalities noted on exam today.  Unclear etiology of her generalized symptoms, possibly post-COVID fatigue and brain fog.  Urinalysis without abnormalities, EKG showing normal sinus rhythm at 64 bpm without acute ST or T  wave changes, labs pending for further evaluation.  Discussed good hydration, close monitoring and follow-up with PCP for further evaluation.  Final Clinical Impressions(s) / UC Diagnoses   Final diagnoses:  Generalized weakness  Other fatigue  Fall, initial encounter  Acute nonintractable headache, unspecified headache type     Discharge Instructions      Your workup today has been reassuring, your labs should be back tomorrow and someone will call  with abnormal results.  Make sure to stay well-hydrated, get plenty of rest, eat proper nutrition and follow-up closely with your primary care provider for a recheck.  In any time that things become worse go to the emergency department for further evaluation    ED Prescriptions   None    PDMP not reviewed this encounter.   Particia Nearing, [place] 11/09/22 1848

## 2022-11-13 DIAGNOSIS — R457 State of emotional shock and stress, unspecified: Secondary | ICD-10-CM | POA: Diagnosis not present

## 2023-01-24 ENCOUNTER — Other Ambulatory Visit: Payer: Self-pay | Admitting: Internal Medicine

## 2023-02-07 DIAGNOSIS — H401111 Primary open-angle glaucoma, right eye, mild stage: Secondary | ICD-10-CM | POA: Diagnosis not present

## 2023-02-28 ENCOUNTER — Other Ambulatory Visit: Payer: Self-pay | Admitting: *Deleted

## 2023-02-28 MED ORDER — ROSUVASTATIN CALCIUM 5 MG PO TABS: 5.0000 mg | ORAL_TABLET | Freq: Every day | ORAL | 3 refills | Status: DC

## 2023-03-12 ENCOUNTER — Ambulatory Visit: Payer: Medicare HMO | Admitting: Internal Medicine

## 2023-03-12 DIAGNOSIS — F419 Anxiety disorder, unspecified: Secondary | ICD-10-CM | POA: Diagnosis not present

## 2023-03-12 DIAGNOSIS — I10 Essential (primary) hypertension: Secondary | ICD-10-CM | POA: Diagnosis not present

## 2023-03-12 DIAGNOSIS — I110 Hypertensive heart disease with heart failure: Secondary | ICD-10-CM | POA: Diagnosis not present

## 2023-03-12 DIAGNOSIS — I209 Angina pectoris, unspecified: Secondary | ICD-10-CM | POA: Diagnosis not present

## 2023-03-15 ENCOUNTER — Ambulatory Visit: Payer: Medicare HMO | Admitting: Internal Medicine

## 2023-04-02 ENCOUNTER — Ambulatory Visit: Payer: Medicare HMO | Admitting: Internal Medicine

## 2023-04-06 ENCOUNTER — Encounter: Payer: Self-pay | Admitting: Internal Medicine

## 2023-04-06 ENCOUNTER — Ambulatory Visit: Payer: Medicare HMO | Attending: Internal Medicine | Admitting: Internal Medicine

## 2023-04-06 VITALS — BP 122/68 | HR 64 | Ht <= 58 in | Wt 161.0 lb

## 2023-04-06 DIAGNOSIS — E785 Hyperlipidemia, unspecified: Secondary | ICD-10-CM

## 2023-04-06 NOTE — Patient Instructions (Addendum)
 Medication Instructions:  Your physician recommends that you continue on your current medications as directed. Please refer to the Current Medication list given to you today.  *If you need a refill on your cardiac medications before your next appointment, please call your pharmacy*   Lab Work: Lipid Panel If you have labs (blood work) drawn today and your tests are completely normal, you will receive your results only by: MyChart Message (if you have MyChart) OR A paper copy in the mail If you have any lab test that is abnormal or we need to change your treatment, we will call you to review the results.   Testing/Procedures: None   Follow-Up: At [facility], you and your health needs are our priority.  As part of our continuing mission to provide you with exceptional heart care, we have created designated Provider Care Teams.  These Care Teams include your primary Cardiologist (physician) and Advanced Practice Providers (APPs -  Physician Assistants and Nurse Practitioners) who all work together to provide you with the care you need, when you need it.  We recommend signing up for the patient portal called MyChart.  Sign up information is provided on this After Visit Summary.  MyChart is used to connect with patients for Virtual Visits (Telemedicine).  Patients are able to view lab/test results, encounter notes, upcoming appointments, etc.  Non-urgent messages can be sent to your provider as well.   To learn more about what you can do with MyChart, go to [email or web address].    Your next appointment:   6 months  Provider:   You may see Vishnu P Mallipeddi, MD or one of the following Advanced Practice Providers on your designated Care Team:   Brittany Strader, PA-C  Olivia Pavy, [place]     Other Instructions

## 2023-04-09 NOTE — Progress Notes (Signed)
 Cardiology Office Note  Date: 04/09/2023   ID: Sheryl, Carter 28-Jul-1940, MRN [id number]  PCP:  Bertell Satterfield, MD  Cardiologist:  Diannah SHAUNNA Maywood, MD Electrophysiologist:  None    History of Present Illness: Sheryl Carter is a 83 y.o. female known to have CAD status post CABG in 2018 (LIMA to LAD, SVG to D2, SVG to OM and SVG to RCA), CAS s/p L CEA, HTN, HLD presented to cardiology clinic for follow-up visit. Patient was previously admitted in 7/23 at [facility] with chief complaint of chest pain and shortness of breath. She underwent nuclear stress test which showed no evidence of ischemia, low risk study. She is here for follow-up visit.  She reported having chest pains especially during stress but no chest pains with exertion.  She says she is stressed out especially around Christmas time.  No other symptoms of DOE, orthopnea, PND, leg swelling, dizziness, syncope.  She is currently taking Imdur  30 mg once daily, rosuvastatin  5 mg nightly and not on Zetia . Patient did not tolerate higher doses of statins due to myalgias.    Past Medical History:  Diagnosis Date   Anxiety    Blindness of left eye    CAD (coronary artery disease)    a. s/p CABG in 05/2016 with LIMA-LAD, SVG-D2, SVG-OM, SVG-RCA   Complication of anesthesia    has gas trapped in her body after   DVT (deep venous thrombosis) (HCC)    Ear problems    Family history of adverse reaction to anesthesia    daughter has gas trapped in her body postop as well   Glaucoma    Hypercholesterolemia    Hypertension    Ischemic cardiomyopathy    a. 05/2016 Echo: EF 50-55%, inf HK.   Pre-diabetes    pt. told by [facility] staff that she is prediabetic, HgbA1c was 5.4   Stroke (HCC)    having TIA's    Past Surgical History:  Procedure Laterality Date   BREAST SURGERY Left    cyst- L breast   CHOLECYSTECTOMY     CORONARY ARTERY BYPASS GRAFT N/A 06/09/2016   Procedure: CORONARY ARTERY BYPASS GRAFTING (CABG)  times 4 using the left internal mammary artery and bilateral thigh greater saphenous veins harvested endoscopically. LIMA to LAD, SVG to DIAGONAL 2, SVG to OM, SVG to RCA;  Surgeon: Dallas KATHEE Jude, MD;  Location: [facility] OR;  Service: Open Heart Surgery;  Laterality: N/A;   ENDARTERECTOMY Left 05/29/2016   Procedure: LEFT CAROTID ENDARTERECTOMY WITH PATCH ANGIOPLASTY;  Surgeon: Krystal JULIANNA Doing, MD;  Location: [facility] OR;  Service: Vascular;  Laterality: Left;   EYE SURGERY Right    cataract   LEFT HEART CATH AND CORONARY ANGIOGRAPHY N/A 06/07/2016   Procedure: Left Heart Cath and Coronary Angiography;  Surgeon: Deatrice DELENA Cage, MD;  Location: [facility] INVASIVE CV LAB;  Service: Cardiovascular;  Laterality: N/A;   TEE WITHOUT CARDIOVERSION N/A 06/09/2016   Procedure: TRANSESOPHAGEAL ECHOCARDIOGRAM (TEE);  Surgeon: Dallas KATHEE Jude, MD;  Location: [facility] OR;  Service: Open Heart Surgery;  Laterality: N/A;    Current Outpatient Medications  Medication Sig Dispense Refill   ALPRAZolam  (XANAX ) 0.25 MG tablet Take 0.25 mg by mouth 2 (two) times daily. *May take one additional dose at lunch as needed     amLODipine  (NORVASC ) 5 MG tablet Take 1 tablet (5 mg total) by mouth daily. 90 tablet 3   antiseptic oral rinse (BIOTENE) LIQD 15 mLs by Mouth Rinse route as  needed for dry mouth.     aspirin  EC 81 MG tablet Take 1 tablet (81 mg total) by mouth daily with breakfast. 120 tablet 2   fexofenadine (ALLEGRA) 180 MG tablet Take 180 mg by mouth daily.     furosemide  (LASIX ) 20 MG tablet Take 1 tablet (20 mg total) by mouth daily. 20 tablet 0   isosorbide  mononitrate (IMDUR ) 30 MG 24 hr tablet Take 30 mg by mouth daily.     NEXLETOL  180 MG TABS TAKE ONE TABLET BY MOUTH ONCE DAILY. 90 tablet 1   nitroGLYCERIN  (NITROSTAT ) 0.4 MG SL tablet DISSOLVE (1) TABLET UNDER THE TONGUE EVERY 5 MINUTES AS NEEDED FOR CHEST PAIN. (Patient taking differently: Place 0.4 mg under the tongue every 5 (five) minutes x 3 doses as needed.) 25 tablet 3    potassium chloride  20 MEQ TBCR Take 20 mEq by mouth daily. 5 tablet 0   rosuvastatin  (CRESTOR ) 5 MG tablet Take 1 tablet (5 mg total) by mouth daily. 90 tablet 3   timolol  (BETIMOL ) 0.5 % ophthalmic solution Place 1 drop into the right eye 2 (two) times daily.      timolol  (TIMOPTIC ) 0.5 % ophthalmic solution Place 1 drop into the right eye 2 (two) times daily.     vitamin B-12 (CYANOCOBALAMIN ) 1000 MCG tablet Take 1,000 mcg by mouth daily.     Vitamin D, Ergocalciferol, (DRISDOL) 1.25 MG (50000 UNIT) CAPS capsule Take 50,000 Units by mouth once a week.     No current facility-administered medications for this visit.   Allergies:  Other, Peanut-containing drug products, Prednisone, Statins, Benadryl  [diphenhydramine ], Erythromycin, Tylenol  [acetaminophen ], and Milk-related compounds   Social History: The patient  reports that she has never smoked. She has never been exposed to tobacco smoke. She has never used smokeless tobacco. She reports that she does not drink alcohol and does not use drugs.   Family History: The patient's family history includes Heart disease in her sister and son.   ROS:  Please see the history of present illness. Otherwise, complete review of systems is positive for none.  All other systems are reviewed and negative.   Physical Exam: VS:  BP 122/68 (BP Location: Left Arm, Patient Position: Sitting, Cuff Size: Large)   Pulse 64   Ht 4' 6 (1.372 m)   Wt 161 lb (73 kg)   SpO2 96%   BMI 38.82 kg/m , BMI Body mass index is 38.82 kg/m.  Wt Readings from Last 3 Encounters:  04/06/23 161 lb (73 kg)  03/06/22 160 lb (72.6 kg)  02/08/22 159 lb (72.1 kg)    General: Patient appears comfortable at rest. HEENT: Conjunctiva and lids normal, oropharynx clear with moist mucosa. Neck: Supple, no elevated JVP or carotid bruits, no thyromegaly. Lungs: Clear to auscultation, nonlabored breathing at rest. Cardiac: Regular rate and rhythm, no S3 or significant systolic murmur,  no pericardial rub. Abdomen: Soft, nontender, no hepatomegaly, bowel sounds present, no guarding or rebound. Extremities: No pitting edema, distal pulses 2+. Skin: Warm and dry. Musculoskeletal: No kyphosis. Neuropsychiatric: Alert and oriented x3, affect grossly appropriate.  Recent Labwork: 11/08/2022: ALT 15; AST 28; BUN 10; Creatinine, Ser 0.85; Hemoglobin 14.7; Platelets 230; Potassium 3.9; Sodium 143     Component Value Date/Time   CHOL 178 10/04/2021 1833   CHOL 157 12/29/2016 0955   TRIG 87 10/04/2021 1833   HDL 68 10/04/2021 1833   HDL 51 12/29/2016 0955   CHOLHDL 2.6 10/04/2021 1833   VLDL 17 10/04/2021 1833  LDLCALC 93 10/04/2021 1833   LDLCALC 87 12/29/2016 0955    Other Studies Reviewed Today: NM stress test 7/23 Notice of ischemia  Echo 7/23 LVEF 65 to 70% Diastology indeterminate  Ultrasound carotid bilateral in 2022 Minor carotid atherosclerosis. Negative for stenosis. Degree of narrowing less than 50% bilaterally by ultrasound criteria.  Assessment and Plan:  Noncardiac chest pain: Chest pain occurs with stress and not with exertion.  Happened around Christmas time.  No recurrence since then.  CAD status post CABG in 2018 (LIMA to LAD, SVG to D2, SVG to OM and SVG to RCA) with normal LVEF, currently angina free: Continue aspirin  81 mg once daily, rosuvastatin  5 mg nightly.  Continue Imdur  30 mg once daily.  HLD, not at goal: Continue rosuvastatin  5 mg nightly.  Did not tolerate Zetia . Currently on Nexletol  180 mg once daily however she will run out of it at the end of the month and cannot pay for it anymore.  Will repeat lipid panel in 3 months.  Based on that, we will start PCSK9 inhibitors or inclisiran.  HTN, controlled: Continue amlodipine  5 mg once daily.  CAS s/p L CEA: Repeat ultrasound carotids in 2022 showed less than 50% stenosis bilaterally.    Disposition:  Follow up 6 months  Signed Dequavion Follette Priya Mirabella Hilario, MD, 04/09/2023 11:03 AM     [facility] at [facility] [address] [place], [place], [place] [address]

## 2023-07-04 ENCOUNTER — Telehealth: Payer: Self-pay | Admitting: Internal Medicine

## 2023-07-04 NOTE — Telephone Encounter (Signed)
 Spoke to pt who stated that she stopped taking Nexletol months ago d/t cost. Pt has an appt in June with PCP for lab work. Pt will have a copy of labs sent to our office.   Pt stated that she is still taking Rosuvastatin.  Will fwd to provider as FYI.

## 2023-07-04 NOTE — Telephone Encounter (Signed)
 Patient calling to for assistant with getting her cholesterol check. Please advise

## 2023-08-27 DIAGNOSIS — H401131 Primary open-angle glaucoma, bilateral, mild stage: Secondary | ICD-10-CM | POA: Diagnosis not present

## 2023-09-24 DIAGNOSIS — F419 Anxiety disorder, unspecified: Secondary | ICD-10-CM | POA: Diagnosis not present

## 2023-09-24 DIAGNOSIS — Z0001 Encounter for general adult medical examination with abnormal findings: Secondary | ICD-10-CM | POA: Diagnosis not present

## 2023-09-24 DIAGNOSIS — I10 Essential (primary) hypertension: Secondary | ICD-10-CM | POA: Diagnosis not present

## 2023-09-24 DIAGNOSIS — I5032 Chronic diastolic (congestive) heart failure: Secondary | ICD-10-CM | POA: Diagnosis not present

## 2023-09-24 DIAGNOSIS — E6609 Other obesity due to excess calories: Secondary | ICD-10-CM | POA: Diagnosis not present

## 2023-09-24 DIAGNOSIS — F0390 Unspecified dementia without behavioral disturbance: Secondary | ICD-10-CM | POA: Diagnosis not present

## 2023-09-24 DIAGNOSIS — H6123 Impacted cerumen, bilateral: Secondary | ICD-10-CM | POA: Diagnosis not present

## 2023-09-24 DIAGNOSIS — Z1331 Encounter for screening for depression: Secondary | ICD-10-CM | POA: Diagnosis not present

## 2023-09-24 DIAGNOSIS — I110 Hypertensive heart disease with heart failure: Secondary | ICD-10-CM | POA: Diagnosis not present

## 2023-09-27 DIAGNOSIS — I5033 Acute on chronic diastolic (congestive) heart failure: Secondary | ICD-10-CM | POA: Diagnosis not present

## 2023-09-27 DIAGNOSIS — Z0001 Encounter for general adult medical examination with abnormal findings: Secondary | ICD-10-CM | POA: Diagnosis not present

## 2023-09-27 DIAGNOSIS — Z9229 Personal history of other drug therapy: Secondary | ICD-10-CM | POA: Diagnosis not present

## 2023-09-27 DIAGNOSIS — I10 Essential (primary) hypertension: Secondary | ICD-10-CM | POA: Diagnosis not present

## 2023-10-09 DIAGNOSIS — E782 Mixed hyperlipidemia: Secondary | ICD-10-CM | POA: Diagnosis not present

## 2023-10-09 DIAGNOSIS — Z833 Family history of diabetes mellitus: Secondary | ICD-10-CM | POA: Diagnosis not present

## 2023-10-09 DIAGNOSIS — H548 Legal blindness, as defined in USA: Secondary | ICD-10-CM | POA: Diagnosis not present

## 2023-10-09 DIAGNOSIS — F419 Anxiety disorder, unspecified: Secondary | ICD-10-CM | POA: Diagnosis not present

## 2023-10-09 DIAGNOSIS — I10 Essential (primary) hypertension: Secondary | ICD-10-CM | POA: Diagnosis not present

## 2023-10-09 DIAGNOSIS — Z7689 Persons encountering health services in other specified circumstances: Secondary | ICD-10-CM | POA: Diagnosis not present

## 2023-10-09 DIAGNOSIS — Z79899 Other long term (current) drug therapy: Secondary | ICD-10-CM | POA: Diagnosis not present

## 2023-10-25 DIAGNOSIS — K219 Gastro-esophageal reflux disease without esophagitis: Secondary | ICD-10-CM | POA: Diagnosis not present

## 2023-10-25 DIAGNOSIS — G629 Polyneuropathy, unspecified: Secondary | ICD-10-CM | POA: Diagnosis not present

## 2023-10-25 DIAGNOSIS — I110 Hypertensive heart disease with heart failure: Secondary | ICD-10-CM | POA: Diagnosis not present

## 2023-11-08 ENCOUNTER — Ambulatory Visit: Admitting: Internal Medicine

## 2023-11-14 ENCOUNTER — Institutional Professional Consult (permissible substitution) (INDEPENDENT_AMBULATORY_CARE_PROVIDER_SITE_OTHER): Admitting: Physician Assistant

## 2023-11-14 ENCOUNTER — Ambulatory Visit (INDEPENDENT_AMBULATORY_CARE_PROVIDER_SITE_OTHER): Admitting: Audiology

## 2023-11-29 ENCOUNTER — Other Ambulatory Visit: Payer: Self-pay

## 2023-11-29 ENCOUNTER — Ambulatory Visit
Admission: EM | Admit: 2023-11-29 | Discharge: 2023-11-29 | Disposition: A | Discharge status: Home / Self Care | Attending: Nurse Practitioner | Admitting: Nurse Practitioner

## 2023-11-29 DIAGNOSIS — N3000 Acute cystitis without hematuria: Secondary | ICD-10-CM | POA: Diagnosis not present

## 2023-11-29 DIAGNOSIS — H6121 Impacted cerumen, right ear: Secondary | ICD-10-CM

## 2023-11-29 DIAGNOSIS — R531 Weakness: Secondary | ICD-10-CM | POA: Diagnosis not present

## 2023-11-29 LAB — POCT URINE DIPSTICK
Bilirubin, UA: NEGATIVE
Blood, UA: NEGATIVE
Glucose, UA: NEGATIVE mg/dL
Ketones, POC UA: NEGATIVE mg/dL
Nitrite, UA: POSITIVE — AB
Protein Ur, POC: NEGATIVE mg/dL
Spec Grav, UA: 1.025 (ref 1.010–1.025)
Urobilinogen, UA: 0.2 U/dL
pH, UA: 5.5 (ref 5.0–8.0)

## 2023-11-29 LAB — POC SOFIA SARS ANTIGEN FIA: SARS Coronavirus 2 Ag: NEGATIVE

## 2023-11-29 MED ORDER — CEPHALEXIN 500 MG PO CAPS: 500.0000 mg | ORAL_CAPSULE | Freq: Four times a day (QID) | ORAL | 0 refills | Status: AC

## 2023-11-29 NOTE — ED Provider Notes (Signed)
 [facility]    CSN: [id number] Arrival date & time: 11/29/23  1351      History   Chief Complaint No chief complaint on file.   HPI Sheryl Carter is a 83 y.o. female.   The history is provided by a relative.   Dents with her daughter for complaints of generalized weakness and panic attacks.  Daughter reports symptoms have been present for the past 2 weeks.  Daughter reports EMS was called out for the patient earlier today.  Daughter reports several symptoms to include hot flashes, head congestion panic attacks, ear pain, nasal congestion, increased confusion, and dizziness.  Daughter reports patient has a history of dementia which was diagnosed by Dr. Bertell, states patient has never seen neurology.  She denies fever, chills, ear drainage, wheezing, difficulty breathing, chest pain, abdominal pain, nausea, or vomiting.  Past Medical History:  Diagnosis Date   Anxiety    Blindness of left eye    CAD (coronary artery disease)    a. s/p CABG in 05/2016 with LIMA-LAD, SVG-D2, SVG-OM, SVG-RCA   Complication of anesthesia    has gas trapped in her body after   DVT (deep venous thrombosis) (HCC)    Ear problems    Family history of adverse reaction to anesthesia    daughter has gas trapped in her body postop as well   Glaucoma    Hypercholesterolemia    Hypertension    Ischemic cardiomyopathy    a. 05/2016 Echo: EF 50-55%, inf HK.   Pre-diabetes    pt. told by [facility] staff that she is prediabetic, HgbA1c was 5.4   Stroke [facility])    having TIA's    Patient Active Problem List   Diagnosis Date Noted   Hypokalemia 03/18/2019   Generalized weakness 03/18/2019   Acute hyponatremia 03/17/2019   Non-cardiac chest pain 09/02/2017   Constipation 06/26/2016   S/P CABG x 4 06/19/2016   Coronary artery disease    NSTEMI (non-ST elevated myocardial infarction) (HCC) 06/07/2016   Paresthesia    TIA (transient ischemic attack) 06/04/2016   Carotid artery stenosis  05/29/2016   Slurred speech 04/18/2016   Hypertension, essential 04/18/2016   Hyperlipidemia 04/18/2016   Chronic anxiety 04/18/2016    Past Surgical History:  Procedure Laterality Date   BREAST SURGERY Left    cyst- L breast   CHOLECYSTECTOMY     CORONARY ARTERY BYPASS GRAFT N/A 06/09/2016   Procedure: CORONARY ARTERY BYPASS GRAFTING (CABG) times 4 using the left internal mammary artery and bilateral thigh greater saphenous veins harvested endoscopically. LIMA to LAD, SVG to DIAGONAL 2, SVG to OM, SVG to RCA;  Surgeon: Dallas KATHEE Jude, MD;  Location: [facility] OR;  Service: Open Heart Surgery;  Laterality: N/A;   ENDARTERECTOMY Left 05/29/2016   Procedure: LEFT CAROTID ENDARTERECTOMY WITH PATCH ANGIOPLASTY;  Surgeon: Krystal JULIANNA Doing, MD;  Location: [facility] OR;  Service: Vascular;  Laterality: Left;   EYE SURGERY Right    cataract   LEFT HEART CATH AND CORONARY ANGIOGRAPHY N/A 06/07/2016   Procedure: Left Heart Cath and Coronary Angiography;  Surgeon: Deatrice DELENA Cage, MD;  Location: [facility] INVASIVE CV LAB;  Service: Cardiovascular;  Laterality: N/A;   TEE WITHOUT CARDIOVERSION N/A 06/09/2016   Procedure: TRANSESOPHAGEAL ECHOCARDIOGRAM (TEE);  Surgeon: Dallas KATHEE Jude, MD;  Location: [facility] OR;  Service: Open Heart Surgery;  Laterality: N/A;    OB History   No obstetric history on file.      Home Medications  Prior to Admission medications   Medication Sig Start Date End Date Taking? Authorizing Provider  cephALEXin  (KEFLEX ) 500 MG capsule Take 1 capsule (500 mg total) by mouth 4 (four) times daily. 11/29/23  Yes Leath-Warren, Etta PARAS, NP  ALPRAZolam  (XANAX ) 0.25 MG tablet Take 0.25 mg by mouth 2 (two) times daily. *May take one additional dose at lunch as needed    [provider]  amLODipine  (NORVASC ) 5 MG tablet Take 1 tablet (5 mg total) by mouth daily. 12/31/19 04/06/23  Johnson Laymon HERO, PA-C  antiseptic oral rinse (BIOTENE) LIQD 15 mLs by Mouth Rinse route as needed for dry mouth.     [provider]  aspirin  EC 81 MG tablet Take 1 tablet (81 mg total) by mouth daily with breakfast. 03/18/19   Emokpae, Courage, MD  fexofenadine (ALLEGRA) 180 MG tablet Take 180 mg by mouth daily.    [provider]  furosemide  (LASIX ) 20 MG tablet Take 1 tablet (20 mg total) by mouth daily. 01/31/21   Blue, Soijett A, PA-C  isosorbide  mononitrate (IMDUR ) 30 MG 24 hr tablet Take 30 mg by mouth daily.    [provider]  NEXLETOL  180 MG TABS TAKE ONE TABLET BY MOUTH ONCE DAILY. 01/24/23   Mallipeddi, Vishnu P, MD  nitroGLYCERIN  (NITROSTAT ) 0.4 MG SL tablet DISSOLVE (1) TABLET UNDER THE TONGUE EVERY 5 MINUTES AS NEEDED FOR CHEST PAIN. Patient taking differently: Place 0.4 mg under the tongue every 5 (five) minutes x 3 doses as needed. 02/07/21   Alvan Dorn FALCON, MD  potassium chloride  20 MEQ TBCR Take 20 mEq by mouth daily. 07/04/21   Idol, Julie, PA-C  rosuvastatin  (CRESTOR ) 5 MG tablet Take 1 tablet (5 mg total) by mouth daily. 02/28/23   Mallipeddi, Vishnu P, MD  timolol  (BETIMOL ) 0.5 % ophthalmic solution Place 1 drop into the right eye 2 (two) times daily.     [provider]  timolol  (TIMOPTIC ) 0.5 % ophthalmic solution Place 1 drop into the right eye 2 (two) times daily. 06/14/22   [provider]  vitamin B-12 (CYANOCOBALAMIN ) 1000 MCG tablet Take 1,000 mcg by mouth daily.    [provider]  Vitamin D, Ergocalciferol, (DRISDOL) 1.25 MG (50000 UNIT) CAPS capsule Take 50,000 Units by mouth once a week. 09/21/22   [provider]    Family History Family History  Problem Relation Age of Onset   Heart disease Sister    Heart disease Son     Social History Social History   Tobacco Use   Smoking status: Never    Passive exposure: Never   Smokeless tobacco: Never  Vaping Use   Vaping status: Never Used  Substance Use Topics   Alcohol use: No   Drug use: No     Allergies   Other, Peanut-containing drug products,  Prednisone, Statins, Benadryl  [diphenhydramine ], Egg-derived products, Erythromycin, Tylenol  [acetaminophen ], and Milk-related compounds   Review of Systems Review of Systems Per HPI  Physical Exam Triage Vital Signs ED Triage Vitals  Encounter Vitals Group     BP 11/29/23 1447 120/73     Girls Systolic BP Percentile --      Girls Diastolic BP Percentile --      Boys Systolic BP Percentile --      Boys Diastolic BP Percentile --      Pulse Rate 11/29/23 1447 87     Resp 11/29/23 1447 16     Temp 11/29/23 1447 97.9 F (36.6 C)  Temp Source 11/29/23 1447 Oral     SpO2 11/29/23 1447 94 %     Weight --      Height --      Head Circumference --      Peak Flow --      Pain Score 11/29/23 1453 9     Pain Loc --      Pain Education --      Exclude from Growth Chart --    No data found.  Updated Vital Signs BP 120/73 (BP Location: Right Arm)   Pulse 87   Temp 97.9 F (36.6 C) (Oral)   Resp 16   SpO2 94%   Visual Acuity Right Eye Distance:   Left Eye Distance:   Bilateral Distance:    Right Eye Near:   Left Eye Near:    Bilateral Near:     Physical Exam Vitals and nursing note reviewed.  Constitutional:      General: She is not in acute distress.    Appearance: Normal appearance.  HENT:     Head: Normocephalic.     Right Ear: Ear canal and external ear normal. There is impacted cerumen.     Left Ear: Ear canal and external ear normal. There is impacted cerumen.     Ears:     Comments: Bilateral cerumen impaction.  Ear irrigation performed with complete removal of obstruction from the right ear only.  TM was within normal limits.  Unable to visualize left TM.    Mouth/Throat:     Mouth: Mucous membranes are moist.  Eyes:     Extraocular Movements: Extraocular movements intact.     Pupils: Pupils are equal, round, and reactive to light.  Cardiovascular:     Rate and Rhythm: Normal rate and regular rhythm.     Pulses: Normal pulses.     Heart sounds: Normal  heart sounds.  Pulmonary:     Effort: Pulmonary effort is normal. No respiratory distress.     Breath sounds: Normal breath sounds. No stridor. No wheezing, rhonchi or rales.  Abdominal:     General: Bowel sounds are normal.     Palpations: Abdomen is soft.     Tenderness: There is no abdominal tenderness.  Musculoskeletal:     Cervical back: Normal range of motion.  Lymphadenopathy:     Cervical: No cervical adenopathy.  Skin:    General: Skin is warm and dry.  Neurological:     General: No focal deficit present.     Mental Status: She is alert and oriented to person, place, and time.  Psychiatric:        Mood and Affect: Mood normal.        Behavior: Behavior normal.      UC Treatments / Results  Labs (all labs ordered are listed, but only abnormal results are displayed) Labs Reviewed  POCT URINE DIPSTICK - Abnormal; Notable for the following components:      Result Value   Clarity, UA cloudy (*)    Nitrite, UA Positive (*)    Leukocytes, UA Small (1+) (*)    All other components within normal limits  POC SOFIA SARS ANTIGEN FIA - Normal  URINE CULTURE    EKG: Normal sinus rhythm, no STEMI.  Compared to EKGs dated 11/08/2022, 11/11/2021, and 10/04/2021.   Radiology No results found.  Procedures Procedures (including critical care time)  Medications Ordered in UC Medications - No data to display  Initial Impression / Assessment and Plan /  UC Course  I have reviewed the triage vital signs and the nursing notes.  Pertinent labs & imaging results that were available during my care of the patient were reviewed by me and considered in my medical decision making (see chart for details).  Patient brought in by her daughter for complaints of increased weakness, dizziness, panic attacks, and altered mental status.  Ear irrigation performed for cerumen instruction, able to remove complete obstruction from the right ear only.  EKG shows normal sinus rhythm, no STEMI.  COVID  test was negative, urinalysis was positive for both nitrites and leukocytes, consistent with acute cystitis.  Symptoms most likely being caused by the acute cystitis.  Will treat empirically with Keflex  500 mg 4 times daily for the next 7 days.  Supportive care recommendations were provided and discussed with the patient's daughter to include fluids, rest, over-the-counter Tylenol , developing a toileting schedule, and to monitor for worsening symptoms.  Daughter was advised to follow-up in the emergency department if patient experiences worsening mental status changes, or develops new symptoms such as fever, chills, or other concerns.  Also recommended daughter to see if patient can be seen sooner by PCP.  Daughter was in agreement with this plan of care and verbalizes understanding.  All questions were answered.  Patient stable for discharge.  Final Clinical Impressions(s) / UC Diagnoses   Final diagnoses:  Weakness  Acute cystitis without hematuria     Discharge Instructions      The urinalysis is consistent for a urinary tract infection.  A urine culture has been ordered.  You will be contacted when the culture results are received.  You will also have access to the results via MyChart. Take medication as prescribed. Increase fluids and allow for plenty of rest.  Try to drink at least 5-7 8 ounce glasses of water daily as long as there are no fluid restrictions. You may take over-the-counter Tylenol  as needed for pain, fever, or general discomfort. Develop a toileting schedule that will allow urination at least every 2 hours. Avoid caffeine such as tea, soda, or coffee while symptoms persist. Follow-up in the emergency department if there is increased confusion, mental status changes, fever, chills, or other concerns. As discussed, recommend reaching out to the PCP office to determine if Mrs. Audi can be seen in an earlier date. Follow-up as needed.     ED Prescriptions      Medication Sig Dispense Auth. Provider   cephALEXin  (KEFLEX ) 500 MG capsule Take 1 capsule (500 mg total) by mouth 4 (four) times daily. 28 capsule Leath-Warren, Etta PARAS, NP      PDMP not reviewed this encounter.   Gilmer Etta PARAS, NP 11/29/23 [phone]

## 2023-11-29 NOTE — Discharge Instructions (Signed)
 The urinalysis is consistent for a urinary tract infection.  A urine culture has been ordered.  You will be contacted when the culture results are received.  You will also have access to the results via MyChart. Take medication as prescribed. Increase fluids and allow for plenty of rest.  Try to drink at least 5-7 8 ounce glasses of water daily as long as there are no fluid restrictions. You may take over-the-counter Tylenol  as needed for pain, fever, or general discomfort. Develop a toileting schedule that will allow urination at least every 2 hours. Avoid caffeine such as tea, soda, or coffee while symptoms persist. Follow-up in the emergency department if there is increased confusion, mental status changes, fever, chills, or other concerns. As discussed, recommend reaching out to the PCP office to determine if Sheryl Carter can be seen in an earlier date. Follow-up as needed.

## 2023-11-29 NOTE — ED Triage Notes (Signed)
 Pt reports, headache, nervousness, weakness,  ear fullness, hot flashes, confusion out side of her normal. head feels stopped up. x 2 weeks intermittently

## 2023-11-29 NOTE — ED Notes (Signed)
 Pt is drinking water to obtain urine sample.

## 2023-12-01 LAB — URINE CULTURE: Culture: >=100000 — AB

## 2023-12-03 ENCOUNTER — Ambulatory Visit: Payer: Self-pay

## 2023-12-06 ENCOUNTER — Institutional Professional Consult (permissible substitution) (INDEPENDENT_AMBULATORY_CARE_PROVIDER_SITE_OTHER): Admitting: Physician Assistant

## 2023-12-06 ENCOUNTER — Ambulatory Visit (INDEPENDENT_AMBULATORY_CARE_PROVIDER_SITE_OTHER): Admitting: Physician Assistant

## 2023-12-06 ENCOUNTER — Ambulatory Visit (INDEPENDENT_AMBULATORY_CARE_PROVIDER_SITE_OTHER): Admitting: Audiology

## 2023-12-06 ENCOUNTER — Encounter (INDEPENDENT_AMBULATORY_CARE_PROVIDER_SITE_OTHER): Payer: Self-pay | Admitting: Physician Assistant

## 2023-12-06 VITALS — BP 137/72 | HR 68 | Ht <= 58 in | Wt 161.0 lb

## 2023-12-06 DIAGNOSIS — H9193 Unspecified hearing loss, bilateral: Secondary | ICD-10-CM

## 2023-12-06 DIAGNOSIS — H903 Sensorineural hearing loss, bilateral: Secondary | ICD-10-CM

## 2023-12-06 DIAGNOSIS — H6123 Impacted cerumen, bilateral: Secondary | ICD-10-CM

## 2023-12-06 NOTE — Progress Notes (Signed)
 Dear Dr. Bertell, Here is my assessment for our mutual patient, Mahagony Grieb. Thank you for allowing me the opportunity to care for your patient. Please do not hesitate to contact me should you have any other questions. Sincerely, Chyrl Cohen PA-C  Otolaryngology Clinic Note Referring provider: Dr. Bertell HPI:  Sheryl Carter is a 83 y.o. female kindly referred by Dr. Bertell   The patient is an 83 year old female seen in our office for evaluation of cerumen impaction.  The patient is accompanied by her daughter who help provides her history.  She notes that she has had several years of trouble hearing.  She has been seen by the primary care provider who noted cerumen in the bilateral external auditory canals.  They were able to remove most of the cerumen but could not complete it due to discomfort.  She notes her hearing has been declining over the last several years, she notes fairly symmetric although she notes a longstanding history of trouble with the right ear.  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  Today she denies any pain, no trauma to the ear.  No infectious signs or symptoms.   Independent Review of Additional Tests or Records:  None   PMH/Meds/All/SocHx/FamHx/ROS:   Past Medical History:  Diagnosis Date   Anxiety    Blindness of left eye    CAD (coronary artery disease)    a. s/p CABG in 05/2016 with LIMA-LAD, SVG-D2, SVG-OM, SVG-RCA   Complication of anesthesia    has gas trapped in her body after   DVT (deep venous thrombosis) (HCC)    Ear problems    Family history of adverse reaction to anesthesia    daughter has gas trapped in her body postop as well   Glaucoma    Hypercholesterolemia    Hypertension    Ischemic cardiomyopathy    a. 05/2016 Echo: EF 50-55%, inf HK.   Pre-diabetes    pt. told by [facility] staff that she is prediabetic, HgbA1c was 5.4   Stroke (HCC)     having TIA's     Past Surgical History:  Procedure Laterality Date   BREAST SURGERY Left    cyst- L breast   CHOLECYSTECTOMY     CORONARY ARTERY BYPASS GRAFT N/A 06/09/2016   Procedure: CORONARY ARTERY BYPASS GRAFTING (CABG) times 4 using the left internal mammary artery and bilateral thigh greater saphenous veins harvested endoscopically. LIMA to LAD, SVG to DIAGONAL 2, SVG to OM, SVG to RCA;  Surgeon: Dallas KATHEE Jude, MD;  Location: [facility] OR;  Service: Open Heart Surgery;  Laterality: N/A;   ENDARTERECTOMY Left 05/29/2016   Procedure: LEFT CAROTID ENDARTERECTOMY WITH PATCH ANGIOPLASTY;  Surgeon: Krystal JULIANNA Doing, MD;  Location: [facility] OR;  Service: Vascular;  Laterality: Left;   EYE SURGERY Right    cataract   LEFT HEART CATH AND CORONARY ANGIOGRAPHY N/A 06/07/2016   Procedure: Left Heart Cath and Coronary Angiography;  Surgeon: Deatrice DELENA Cage, MD;  Location: [facility] INVASIVE CV LAB;  Service: Cardiovascular;  Laterality: N/A;   TEE WITHOUT CARDIOVERSION N/A 06/09/2016   Procedure: TRANSESOPHAGEAL ECHOCARDIOGRAM (TEE);  Surgeon: Dallas KATHEE Jude, MD;  Location: [facility] OR;  Service: Open Heart Surgery;  Laterality: N/A;    Family History  Problem Relation Age of Onset   Heart disease Sister    Heart disease Son  Social Connections: Not on file      Current Outpatient Medications:    ALPRAZolam  (XANAX ) 0.25 MG tablet, Take 0.25 mg by mouth 2 (two) times daily. *May take one additional dose at lunch as needed, Disp: , Rfl:    amLODipine  (NORVASC ) 5 MG tablet, Take 1 tablet (5 mg total) by mouth daily., Disp: 90 tablet, Rfl: 3   antiseptic oral rinse (BIOTENE) LIQD, 15 mLs by Mouth Rinse route as needed for dry mouth., Disp: , Rfl:    aspirin  EC 81 MG tablet, Take 1 tablet (81 mg total) by mouth daily with breakfast., Disp: 120 tablet, Rfl: 2   cephALEXin  (KEFLEX ) 500 MG capsule, Take 1 capsule (500 mg total) by mouth 4 (four) times daily., Disp: 28 capsule, Rfl: 0   fexofenadine (ALLEGRA) 180  MG tablet, Take 180 mg by mouth daily., Disp: , Rfl:    furosemide  (LASIX ) 20 MG tablet, Take 1 tablet (20 mg total) by mouth daily., Disp: 20 tablet, Rfl: 0   isosorbide  mononitrate (IMDUR ) 30 MG 24 hr tablet, Take 30 mg by mouth daily., Disp: , Rfl:    NEXLETOL  180 MG TABS, TAKE ONE TABLET BY MOUTH ONCE DAILY., Disp: 90 tablet, Rfl: 1   nitroGLYCERIN  (NITROSTAT ) 0.4 MG SL tablet, DISSOLVE (1) TABLET UNDER THE TONGUE EVERY 5 MINUTES AS NEEDED FOR CHEST PAIN. (Patient taking differently: Place 0.4 mg under the tongue every 5 (five) minutes x 3 doses as needed.), Disp: 25 tablet, Rfl: 3   potassium chloride  20 MEQ TBCR, Take 20 mEq by mouth daily., Disp: 5 tablet, Rfl: 0   rosuvastatin  (CRESTOR ) 5 MG tablet, Take 1 tablet (5 mg total) by mouth daily., Disp: 90 tablet, Rfl: 3   timolol  (BETIMOL ) 0.5 % ophthalmic solution, Place 1 drop into the right eye 2 (two) times daily. , Disp: , Rfl:    timolol  (TIMOPTIC ) 0.5 % ophthalmic solution, Place 1 drop into the right eye 2 (two) times daily., Disp: , Rfl:    vitamin B-12 (CYANOCOBALAMIN ) 1000 MCG tablet, Take 1,000 mcg by mouth daily., Disp: , Rfl:    Vitamin D, Ergocalciferol, (DRISDOL) 1.25 MG (50000 UNIT) CAPS capsule, Take 50,000 Units by mouth once a week., Disp: , Rfl:    Physical Exam:   BP 137/72   Pulse 68   Ht 4' 7 (1.397 m)   Wt 161 lb (73 kg)   SpO2 92%   BMI 37.42 kg/m   Pertinent Findings  CN II-XII intact Bilateral external auditory canals with cerumen Anterior rhinoscopy: Septum midline; bilateral inferior turbinates with no hypertrophy No lesions of oral cavity/oropharynx;  No obviously palpable neck masses/lymphadenopathy/thyromegaly No respiratory distress or stridor  Seprately Identifiable Procedures:  Procedure: Bilateral ear microscopy and cerumen removal using microscope (CPT [phone]) - Mod 50 Pre-procedure diagnosis: bilateral cerumen impaction external auditory canals Post-procedure diagnosis: same Indication:  bilateral cerumen impaction; given patient's otologic complaints and history as well as for improved and comprehensive examination of external ear and tympanic membrane, bilateral otologic examination using microscope was performed and impacted cerumen removed  Procedure: Patient was placed semi-recumbent. Both ear canals were examined using the microscope with findings above. Cerumen removed from bilateral external auditory canals using suction and currette with improvement in EAC examination and patency. Left: EAC was patent. TM was intact . Middle ear was aerated. Drainage: none Right: EAC was patent. TM was intact . Middle ear was aerated . Drainage: none Patient tolerated the procedure well.   Impression & Plans:  [place]le[place]  Sheryl Carter is a 83 y.o. female with the following   Cerumen impaction-  Cerumen removed without difficulty today.  Patient with ongoing hearing loss.  She has scheduled audiological evaluation.  If there are any significant findings on the audiological evaluation I will call her with the results.  Otherwise she would likely be referred to local hearing aid provider.  The patient and her daughter verbalized understanding and agreement to today's plan had no further questions or concerns.   - f/u PRN   Thank you for allowing me the opportunity to care for your patient. Please do not hesitate to contact me should you have any other questions.  Sincerely, Chyrl Cohen PA-C [facility] Phone: [phone] Fax: [phone]  12/06/2023, 11:02 AM

## 2023-12-06 NOTE — Progress Notes (Unsigned)
  [address], [address] [place], [place] [address] [phone]  Audiological Evaluation    Name: Sheryl Carter     DOB:   10-18-1940      MRN:   [id number]                                                                                     Service Date: 12/06/2023     Accompanied by: daughter (POA)   Patient comes today after Reyes Cohen, PA-C sent a referral for a hearing evaluation due to concerns with hearing loss.   Symptoms Yes Details  Hearing loss  [x]  Both ears, worse in the right ear  all her life. Daughter says that the family has noticed that she struggles more understanding her.   Tinnitus  []    Ear pain/ infections/pressure  []    Balance problems  []    Noise exposure history  []    Previous ear surgeries  []    Family history of hearing loss  []    Amplification  []    Other  [x]  Daughter reported that a physician said she might have dementia.    Otoscopy: Right ear: Clear external ear canal and notable landmarks visualized on the tympanic membrane. Left ear:  Clear external ear canal and notable landmarks visualized on the tympanic membrane.  Tympanometry: Right ear: Type A- Normal external ear canal volume with normal middle ear pressure and tympanic membrane compliance. Left ear: Type A- Normal external ear canal volume with normal middle ear pressure and tympanic membrane compliance.  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 Left ear-  Borderline normal to severe sensorineural hearing loss from 250 Hz - 8000 Hz.  Speech Audiometry: Right ear- Speech Reception Threshold (SRT) was obtained at 60 dBHL. Left ear-Speech Reception Threshold (SRT) was obtained at 35 dBHL.   Word Recognition Score Tested using NU-6 (recorded) Right ear: 12% was obtained at a presentation level of 90 dBHL with contralateral masking which is deemed as  poor. Left ear: 76% was obtained at a presentation level  of 75 dBHL without contralateral masking which is deemed as  fair.   The hearing test results were completed under headphones and results are deemed to be of good reliability. Test technique:  conventional    Impression: There is a significant difference in pure-tone thresholds between ears.   Recommendations: Follow up with ENT as scheduled for today. Return for a hearing evaluation if concerns with hearing changes arise or per MD recommendation. Consider a communication needs assessment after medical clearance for hearing aids is obtained.   Sheryl Carter, AUD

## 2024-02-01 ENCOUNTER — Ambulatory Visit: Admitting: Internal Medicine

## 2024-02-03 ENCOUNTER — Other Ambulatory Visit: Payer: Self-pay | Admitting: Internal Medicine

## 2024-03-21 ENCOUNTER — Emergency Department (HOSPITAL_COMMUNITY)

## 2024-03-21 ENCOUNTER — Emergency Department (HOSPITAL_COMMUNITY)
Admission: EM | Admit: 2024-03-21 | Discharge: 2024-03-21 | Disposition: A | Discharge status: Home / Self Care | Attending: Emergency Medicine | Admitting: Emergency Medicine

## 2024-03-21 ENCOUNTER — Other Ambulatory Visit: Payer: Self-pay

## 2024-03-21 ENCOUNTER — Encounter (HOSPITAL_COMMUNITY): Payer: Self-pay

## 2024-03-21 DIAGNOSIS — I10 Essential (primary) hypertension: Secondary | ICD-10-CM | POA: Diagnosis not present

## 2024-03-21 DIAGNOSIS — J101 Influenza due to other identified influenza virus with other respiratory manifestations: Secondary | ICD-10-CM | POA: Insufficient documentation

## 2024-03-21 DIAGNOSIS — R509 Fever, unspecified: Secondary | ICD-10-CM | POA: Diagnosis present

## 2024-03-21 DIAGNOSIS — I2510 Atherosclerotic heart disease of native coronary artery without angina pectoris: Secondary | ICD-10-CM | POA: Insufficient documentation

## 2024-03-21 LAB — COMPREHENSIVE METABOLIC PANEL WITH GFR
ALT: 16 U/L (ref 0–44)
AST: 26 U/L (ref 15–41)
Albumin: 3.8 g/dL (ref 3.5–5.0)
Alkaline Phosphatase: 77 U/L (ref 38–126)
Anion gap: 8 (ref 5–15)
BUN: 8 mg/dL (ref 8–23)
CO2: 30 mmol/L (ref 22–32)
Calcium: 8.8 mg/dL — ABNORMAL LOW (ref 8.9–10.3)
Chloride: 99 mmol/L (ref 98–111)
Creatinine, Ser: 0.8 mg/dL (ref 0.44–1.00)
GFR, Estimated: >60 mL/min
Glucose, Bld: 119 mg/dL — ABNORMAL HIGH (ref 70–99)
Potassium: 3.1 mmol/L — ABNORMAL LOW (ref 3.5–5.1)
Sodium: 137 mmol/L (ref 135–145)
Total Bilirubin: 0.7 mg/dL (ref 0.0–1.2)
Total Protein: 6.1 g/dL — ABNORMAL LOW (ref 6.5–8.1)

## 2024-03-21 LAB — RESP PANEL BY RT-PCR (RSV, FLU A&B, COVID)  RVPGX2
Influenza A by PCR: POSITIVE — AB
Influenza B by PCR: NEGATIVE
Resp Syncytial Virus by PCR: NEGATIVE
SARS Coronavirus 2 by RT PCR: NEGATIVE

## 2024-03-21 LAB — TROPONIN T, HIGH SENSITIVITY
Troponin T High Sensitivity: 19 ng/L (ref 0–19)
Troponin T High Sensitivity: 19 ng/L (ref 0–19)

## 2024-03-21 LAB — CBC
HCT: 39.6 % (ref 36.0–46.0)
Hemoglobin: 13.9 g/dL (ref 12.0–15.0)
MCH: 32.5 pg (ref 26.0–34.0)
MCHC: 35.1 g/dL (ref 30.0–36.0)
MCV: 92.5 fL (ref 80.0–100.0)
Platelets: 151 K/uL (ref 150–400)
RBC: 4.28 MIL/uL (ref 3.87–5.11)
RDW: 12.5 % (ref 11.5–15.5)
WBC: 4.1 K/uL (ref 4.0–10.5)
nRBC: 0 % (ref 0.0–0.2)

## 2024-03-21 LAB — LIPASE, BLOOD: Lipase: 35 U/L (ref 11–51)

## 2024-03-21 MED ORDER — BENZONATATE 100 MG PO CAPS: 100.0000 mg | ORAL_CAPSULE | Freq: Three times a day (TID) | ORAL | 0 refills | Status: AC

## 2024-03-21 NOTE — Discharge Instructions (Addendum)
 You were evaluated in the Emergency Department and after careful evaluation, we did not find any emergent condition requiring admission or further testing in the hospital.  Your exam/testing today is overall reassuring.  You tested positive for the flu.  Continue Tylenol  at home as needed for fever or discomfort.  Use the Tessalon  to help suppress your cough.  Plenty fluids and rest, follow-up with your primary care doctor.  Please return to the Emergency Department if you experience any worsening of your condition.   Thank you for allowing us  to be a part of your care.

## 2024-03-21 NOTE — ED Triage Notes (Addendum)
 Patient BIB [facility] from home for flu-like symptoms that been going on for the past week, vomiting started this am. Temp 100.7, BP 143/78, 96, 96% RA. Patient report some fevers at home and shortness of breath.

## 2024-03-21 NOTE — ED Provider Notes (Signed)
 " [facility]-EMERGENCY DEPT [facility] Emergency Department Provider Note MRN:  [id number]  Arrival date & time: 03/21/2024     Chief Complaint   Fever, Emesis, and Shortness of Breath   History of Present Illness   Sheryl Carter is a 83 y.o. year-old female with a history of left eye blindness, CAD, hypertension, ischemic cardiomyopathy presenting to the ED with chief complaint of shortness of breath.  Patient feeling shortness of breath, having aches and pains, headache, chest discomfort, abdominal pain, poor appetite, persistent cough for 2 weeks.  Feels generally unwell with malaise and fatigue.  Denies leg pain or swelling, no nausea vomiting or diarrhea, no burning with urination.  Review of Systems  A thorough review of systems was obtained and all systems are negative except as noted in the HPI and PMH.   Patient's Health History    Past Medical History:  Diagnosis Date   Anxiety    Blindness of left eye    CAD (coronary artery disease)    a. s/p CABG in 05/2016 with LIMA-LAD, SVG-D2, SVG-OM, SVG-RCA   Complication of anesthesia    has gas trapped in her body after   DVT (deep venous thrombosis) (HCC)    Ear problems    Family history of adverse reaction to anesthesia    daughter has gas trapped in her body postop as well   Glaucoma    Hypercholesterolemia    Hypertension    Ischemic cardiomyopathy    a. 05/2016 Echo: EF 50-55%, inf HK.   Pre-diabetes    pt. told by [facility] staff that she is prediabetic, HgbA1c was 5.4   Stroke (HCC)    having TIA's    Past Surgical History:  Procedure Laterality Date   BREAST SURGERY Left    cyst- L breast   CHOLECYSTECTOMY     CORONARY ARTERY BYPASS GRAFT N/A 06/09/2016   Procedure: CORONARY ARTERY BYPASS GRAFTING (CABG) times 4 using the left internal mammary artery and bilateral thigh greater saphenous veins harvested endoscopically. LIMA to LAD, SVG to DIAGONAL 2, SVG to OM, SVG to RCA;  Surgeon: Dallas KATHEE Jude, MD;   Location: [facility] OR;  Service: Open Heart Surgery;  Laterality: N/A;   ENDARTERECTOMY Left 05/29/2016   Procedure: LEFT CAROTID ENDARTERECTOMY WITH PATCH ANGIOPLASTY;  Surgeon: Krystal JULIANNA Doing, MD;  Location: [facility] OR;  Service: Vascular;  Laterality: Left;   EYE SURGERY Right    cataract   LEFT HEART CATH AND CORONARY ANGIOGRAPHY N/A 06/07/2016   Procedure: Left Heart Cath and Coronary Angiography;  Surgeon: Deatrice DELENA Cage, MD;  Location: [facility] INVASIVE CV LAB;  Service: Cardiovascular;  Laterality: N/A;   TEE WITHOUT CARDIOVERSION N/A 06/09/2016   Procedure: TRANSESOPHAGEAL ECHOCARDIOGRAM (TEE);  Surgeon: Dallas KATHEE Jude, MD;  Location: [facility] OR;  Service: Open Heart Surgery;  Laterality: N/A;    Family History  Problem Relation Age of Onset   Heart disease Sister    Heart disease Son     Social History   Socioeconomic History   Marital status: Divorced    Spouse name: Not on file   Number of children: 4   Years of education: 10   Highest education level: Not on file  Occupational History    Comment: retired  Tobacco Use   Smoking status: Never    Passive exposure: Never   Smokeless tobacco: Never  Vaping Use   Vaping status: Never Used  Substance and Sexual Activity   Alcohol use: No   Drug use:  No   Sexual activity: Not Currently  Other Topics Concern   Not on file  Social History Narrative   Lives with daughter   No caffeine since Dec 2017   Social Drivers of Health   Tobacco Use: Low Risk (03/21/2024)   Patient History    Smoking Tobacco Use: Never    Smokeless Tobacco Use: Never    Passive Exposure: Never  Financial Resource Strain: Not on file  Food Insecurity: Not on file  Transportation Needs: Not on file  Physical Activity: Not on file  Stress: Not on file  Social Connections: Not on file  Intimate Partner Violence: Not on file  Depression (EYV7-0): Not on file  Alcohol Screen: Not on file  Housing: Not on file  Utilities: Not on file  Health Literacy: Not on  file     Physical Exam   Vitals:   03/21/24 0611 03/21/24 0615  BP:    Pulse:  78  Resp:  (!) 27  Temp: 99.5 F (37.5 C)   SpO2:  95%    CONSTITUTIONAL: Well-appearing, NAD NEURO/PSYCH:  Alert and oriented x 3, no focal deficits EYES:  eyes equal and reactive ENT/NECK:  no LAD, no JVD CARDIO: Regular rate, well-perfused, normal S1 and S2 PULM:  CTAB no wheezing or rhonchi GI/GU:  non-distended, non-tender MSK/SPINE:  No gross deformities, no edema SKIN:  no rash, atraumatic   *Additional and/or pertinent findings included in MDM below  Diagnostic and Interventional Summary    EKG Interpretation Date/Time:  Friday March 21 2024 04:56:53 EST Ventricular Rate:  78 PR Interval:  187 QRS Duration:  92 QT Interval:  417 QTC Calculation: 475 R Axis:   58  Text Interpretation: Sinus rhythm Nonspecific repol abnormality, diffuse leads Confirmed by Theadore Sharper [id number]) on 03/21/2024 6:31:27 AM       Labs Reviewed  RESP PANEL BY RT-PCR (RSV, FLU A&B, COVID)  RVPGX2 - Abnormal; Notable for the following components:      Result Value   Influenza A by PCR POSITIVE (*)    All other components within normal limits  COMPREHENSIVE METABOLIC PANEL WITH GFR - Abnormal; Notable for the following components:   Potassium 3.1 (*)    Glucose, Bld 119 (*)    Calcium  8.8 (*)    Total Protein 6.1 (*)    All other components within normal limits  CBC  LIPASE, BLOOD  TROPONIN T, HIGH SENSITIVITY  TROPONIN T, HIGH SENSITIVITY    DG Chest Port 1 View  Final Result      Medications - No data to display   Procedures  /  Critical Care Procedures  ED Course and Medical Decision Making  Initial Impression and Ddx Patient experiencing a broad range of symptoms, could be viral illness, other considerations include ACS, pneumonia, electrolyte disturbance.  Abdomen is soft and nontender, slightly increased oral temperature 99.8 otherwise reassuring vital signs, no increased work of  breathing, lungs clear.  Past medical/surgical history that increases complexity of ED encounter: None  Interpretation of Diagnostics I personally reviewed the EKG and my interpretation is as follows: Sinus rhythm  No significant blood count or electrolyte disturbance.  Troponin negative x 2.  Chest x-ray without evidence of lobar pneumonia or pneumothorax.  Patient Reassessment and Ultimate Disposition/Management     Tested positive for influenza A which is a good explanation for patient's symptoms.  Well-appearing on my reassessment, no increased work of breathing, no signs of more emergent process.  Plan is for  discharge home, return precautions discussed.  Patient management required discussion with the following services or consulting groups:  None  Complexity of Problems Addressed Acute illness or injury that poses threat of life of bodily function  Additional Data Reviewed and Analyzed Further history obtained from: Further history from spouse/family member  Additional Factors Impacting ED Encounter Risk Consideration of hospitalization  Sheryl Carter. Theadore, MD [facility] Emergency Medicine [facility] [email or web address]  Final Clinical Impressions(s) / ED Diagnoses     ICD-10-CM   1. Influenza A  J10.1       ED Discharge Orders          Ordered    benzonatate  (TESSALON ) 100 MG capsule  Every 8 hours        03/21/24 0708             Discharge Instructions Discussed with and Provided to Patient:     Discharge Instructions      You were evaluated in the Emergency Department and after careful evaluation, we did not find any emergent condition requiring admission or further testing in the hospital.  Your exam/testing today is overall reassuring.  You tested positive for the flu.  Continue Tylenol  at home as needed for fever or discomfort.  Use the Tessalon  to help suppress your cough.  Plenty fluids and rest, follow-up with your primary care  doctor.  Please return to the Emergency Department if you experience any worsening of your condition.   Thank you for allowing us  to be a part of your care.       Theadore Sheryl HERO, MD 03/21/24 [phone]  "

## 2024-04-15 ENCOUNTER — Ambulatory Visit: Admitting: Internal Medicine

## 2024-04-25 ENCOUNTER — Inpatient Hospital Stay (HOSPITAL_COMMUNITY): Admission: RE | Admit: 2024-04-25 | Source: Ambulatory Visit

## 2024-04-25 ENCOUNTER — Ambulatory Visit: Attending: Internal Medicine | Admitting: Internal Medicine

## 2024-04-25 ENCOUNTER — Encounter: Payer: Self-pay | Admitting: Internal Medicine

## 2024-04-25 VITALS — BP 118/70 | HR 78

## 2024-04-25 DIAGNOSIS — I6529 Occlusion and stenosis of unspecified carotid artery: Secondary | ICD-10-CM | POA: Diagnosis not present

## 2024-04-25 NOTE — Patient Instructions (Signed)
 Medication Instructions:  Your physician recommends that you continue on your current medications as directed. Please refer to the Current Medication list given to you today.  *If you need a refill on your cardiac medications before your next appointment, please call your pharmacy*  Lab Work: None If you have labs (blood work) drawn today and your tests are completely normal, you will receive your results only by: MyChart Message (if you have MyChart) OR A paper copy in the mail If you have any lab test that is abnormal or we need to change your treatment, we will call you to review the results.  Testing/Procedures: Your physician has requested that you have a carotid duplex. This test is an ultrasound of the carotid arteries in your neck. It looks at blood flow through these arteries that supply the brain with blood. Allow one hour for this exam. There are no restrictions or special instructions.   Follow-Up: At [facility], you and your health needs are our priority.  As part of our continuing mission to provide you with exceptional heart care, our providers are all part of one team.  This team includes your primary Cardiologist (physician) and Advanced Practice Providers or APPs (Physician Assistants and Nurse Practitioners) who all work together to provide you with the care you need, when you need it.  Your next appointment:   1 year(s)  Provider:   You may see Vishnu P Mallipeddi, MD or one of the following Advanced Practice Providers on your designated Care Team:   Brittany Strader, PA-C  [place], [place] Olivia [place], [place]     We recommend signing up for the patient portal called MyChart.  Sign up information is provided on this After Visit Summary.  MyChart is used to connect with patients for Virtual Visits (Telemedicine).  Patients are able to view lab/test results, encounter notes, upcoming appointments, etc.  Non-urgent messages can be sent to your provider  as well.   To learn more about what you can do with MyChart, go to [email or web address].   Other Instructions

## 2024-04-25 NOTE — Progress Notes (Signed)
 "    Cardiology Office Note  Date: 04/25/2024   ID: Sheryl, Carter May 04, 1940, MRN [id number]  PCP:  Sheryl Norleen PEDLAR, MD  Cardiologist:  Sheryl SHAUNNA Maywood, MD Electrophysiologist:  None    History of Present Illness: Sheryl Carter is a 84 y.o. female known to have CAD status post CABG in 2018 (LIMA to LAD, SVG to D2, SVG to OM and SVG to RCA), CAS s/p L CEA, HTN, HLD presented to cardiology clinic for follow-up visit.  Accompanied by son.  Not on statin or Zetia .  Currently on Nexletol .  Lipids controlled.  Reviewed lipid panel from October 2020 for that showed LDL 73 and TG 71, both within normal meds.  She has chest pain with anxiety but no chest pain with exertion.  She lives independently.  She performs her daily activities with no issues.  She had flu in December 2025 and lasted for 1 month.  She is still recovering from below.  Does not have any symptoms of true angina.  No DOE, dizziness, syncope.      Past Medical History:  Diagnosis Date   Anxiety    Blindness of left eye    CAD (coronary artery disease)    a. s/p CABG in 05/2016 with LIMA-LAD, SVG-D2, SVG-OM, SVG-RCA   Complication of anesthesia    has gas trapped in her body after   DVT (deep venous thrombosis) (HCC)    Ear problems    Family history of adverse reaction to anesthesia    daughter has gas trapped in her body postop as well   Glaucoma    Hypercholesterolemia    Hypertension    Ischemic cardiomyopathy    a. 05/2016 Echo: EF 50-55%, inf HK.   Pre-diabetes    pt. told by [facility] staff that she is prediabetic, HgbA1c was 5.4   Stroke (HCC)    having TIA's    Past Surgical History:  Procedure Laterality Date   BREAST SURGERY Left    cyst- L breast   CHOLECYSTECTOMY     CORONARY ARTERY BYPASS GRAFT N/A 06/09/2016   Procedure: CORONARY ARTERY BYPASS GRAFTING (CABG) times 4 using the left internal mammary artery and bilateral thigh greater saphenous veins harvested endoscopically. LIMA to LAD,  SVG to DIAGONAL 2, SVG to OM, SVG to RCA;  Surgeon: Sheryl KATHEE Jude, MD;  Location: [facility] OR;  Service: Open Heart Surgery;  Laterality: N/A;   ENDARTERECTOMY Left 05/29/2016   Procedure: LEFT CAROTID ENDARTERECTOMY WITH PATCH ANGIOPLASTY;  Surgeon: Sheryl JULIANNA Doing, MD;  Location: [facility] OR;  Service: Vascular;  Laterality: Left;   EYE SURGERY Right    cataract   LEFT HEART CATH AND CORONARY ANGIOGRAPHY N/A 06/07/2016   Procedure: Left Heart Cath and Coronary Angiography;  Surgeon: Sheryl DELENA Cage, MD;  Location: [facility] INVASIVE CV LAB;  Service: Cardiovascular;  Laterality: N/A;   TEE WITHOUT CARDIOVERSION N/A 06/09/2016   Procedure: TRANSESOPHAGEAL ECHOCARDIOGRAM (TEE);  Surgeon: Sheryl KATHEE Jude, MD;  Location: [facility] OR;  Service: Open Heart Surgery;  Laterality: N/A;    Current Outpatient Medications  Medication Sig Dispense Refill   ALPRAZolam  (XANAX ) 0.25 MG tablet Take 0.25 mg by mouth 2 (two) times daily. *May take one additional dose at lunch as needed     amLODipine  (NORVASC ) 5 MG tablet Take 1 tablet (5 mg total) by mouth daily. 90 tablet 3   antiseptic oral rinse (BIOTENE) LIQD 15 mLs by Mouth Rinse route as needed for dry mouth.  aspirin  EC 81 MG tablet Take 1 tablet (81 mg total) by mouth daily with breakfast. 120 tablet 2   fexofenadine (ALLEGRA) 180 MG tablet Take 180 mg by mouth daily.     furosemide  (LASIX ) 20 MG tablet Take 1 tablet (20 mg total) by mouth daily. 20 tablet 0   isosorbide  mononitrate (IMDUR ) 30 MG 24 hr tablet Take 30 mg by mouth daily.     nitroGLYCERIN  (NITROSTAT ) 0.4 MG SL tablet DISSOLVE (1) TABLET UNDER THE TONGUE EVERY 5 MINUTES AS NEEDED FOR CHEST PAIN. (Patient taking differently: Place 0.4 mg under the tongue every 5 (five) minutes x 3 doses as needed.) 25 tablet 3   potassium chloride  20 MEQ TBCR Take 20 mEq by mouth daily. 5 tablet 0   timolol  (BETIMOL ) 0.5 % ophthalmic solution Place 1 drop into the right eye 2 (two) times daily.      timolol  (TIMOPTIC ) 0.5 %  ophthalmic solution Place 1 drop into the right eye 2 (two) times daily.     vitamin B-12 (CYANOCOBALAMIN ) 1000 MCG tablet Take 1,000 mcg by mouth daily.     benzonatate  (TESSALON ) 100 MG capsule Take 1 capsule (100 mg total) by mouth every 8 (eight) hours. (Patient not taking: Reported on 04/25/2024) 21 capsule 0   cephALEXin  (KEFLEX ) 500 MG capsule Take 1 capsule (500 mg total) by mouth 4 (four) times daily. (Patient not taking: Reported on 04/25/2024) 28 capsule 0   NEXLETOL  180 MG TABS TAKE ONE TABLET BY MOUTH ONCE DAILY. 90 tablet 1   rosuvastatin  (CRESTOR ) 5 MG tablet TAKE ONE TABLET BY MOUTH EVERY DAY (Patient not taking: Reported on 04/25/2024) 90 tablet 0   Vitamin D, Ergocalciferol, (DRISDOL) 1.25 MG (50000 UNIT) CAPS capsule Take 50,000 Units by mouth once a week. (Patient not taking: Reported on 04/25/2024)     No current facility-administered medications for this visit.   Allergies:  Other, Peanut-containing drug products, Prednisone, Statins, Benadryl  [diphenhydramine ], Egg protein-containing drug products, Erythromycin, Tylenol  [acetaminophen ], and Milk-related compounds   Social History: The patient  reports that she has never smoked. She has never been exposed to tobacco smoke. She has never used smokeless tobacco. She reports that she does not drink alcohol and does not use drugs.   Family History: The patient's family history includes Heart disease in her sister and son.   ROS:  Please see the history of present illness. Otherwise, complete review of systems is positive for none.  All other systems are reviewed and negative.   Physical Exam: VS:  BP 118/70 (BP Location: Right Arm, Cuff Size: Normal)   Pulse 78   SpO2 95% , BMI There is no height or weight on file to calculate BMI.  Wt Readings from Last 3 Encounters:  03/21/24 153 lb (69.4 kg)  12/06/23 161 lb (73 kg)  04/06/23 161 lb (73 kg)    General: Patient appears comfortable at rest. HEENT: Conjunctiva and lids  normal, oropharynx clear with moist mucosa. Neck: Supple, no elevated JVP or carotid bruits, no thyromegaly. Lungs: Clear to auscultation, nonlabored breathing at rest. Cardiac: Regular rate and rhythm, no S3 or significant systolic murmur, no pericardial rub. Abdomen: Soft, nontender, no hepatomegaly, bowel sounds present, no guarding or rebound. Extremities: No pitting edema, distal pulses 2+. Skin: Warm and dry. Musculoskeletal: No kyphosis. Neuropsychiatric: Alert and oriented x3, affect grossly appropriate.  Recent Labwork: 03/21/2024: ALT 16; AST 26; BUN 8; Creatinine, Ser 0.80; Hemoglobin 13.9; Platelets 151; Potassium 3.1; Sodium 137  Component Value Date/Time   CHOL 178 10/04/2021 1833   CHOL 157 12/29/2016 0955   TRIG 87 10/04/2021 1833   HDL 68 10/04/2021 1833   HDL 51 12/29/2016 0955   CHOLHDL 2.6 10/04/2021 1833   VLDL 17 10/04/2021 1833   LDLCALC 93 10/04/2021 1833   LDLCALC 87 12/29/2016 0955    Other Studies Reviewed Today: NM stress test 7/23 No evidence of ischemia Echo 7/23 LVEF 65 to 70% Diastology indeterminate  Ultrasound carotid bilateral in 2022 Minor carotid atherosclerosis. Negative for stenosis. Degree of narrowing less than 50% bilaterally by ultrasound criteria.  Assessment and Plan:   CAD status post CABG in 2018 (LIMA to LAD, SVG to D2, SVG to OM and SVG to RCA) with normal LVEF, currently angina free: No angina.  She has chest pains with anxiety.  Continue aspirin  81 mg once daily, Nexletol  180 mg once daily and Imdur  30 mg once daily.  HLD, at goal: Continue Nexletol  180 mg once daily.  Not on statin or Zetia .  Lipid panel from October 2024 reviewed, TG 71 and LDL 73, both within normal limits.  HTN, controlled: Continue amlodipine  5 mg once daily.  CAS s/p L CEA: Repeat ultrasound carotids in 2022 showed less than 50% stenosis bilaterally.  Repeat ultrasound.    Disposition:  Follow up 1 year  Signed Francina Beery Priya Catherine Cubero,  MD, 04/25/2024 9:58 AM    [facility] [address] [place], [place], [place] [address]  "

## 2024-05-09 ENCOUNTER — Ambulatory Visit (HOSPITAL_COMMUNITY)
